# Patient Record
Sex: Male | Born: 1945
Health system: Southern US, Community
[De-identification: ages and names within clinical notes are randomized; demographics above are authoritative.]

## PROBLEM LIST (undated history)

## (undated) DIAGNOSIS — K5790 Diverticulosis of intestine, part unspecified, without perforation or abscess without bleeding: Secondary | ICD-10-CM

## (undated) DIAGNOSIS — J189 Pneumonia, unspecified organism: Secondary | ICD-10-CM

## (undated) DIAGNOSIS — I712 Thoracic aortic aneurysm, without rupture, unspecified: Secondary | ICD-10-CM

## (undated) DIAGNOSIS — R0600 Dyspnea, unspecified: Secondary | ICD-10-CM

## (undated) DIAGNOSIS — C61 Malignant neoplasm of prostate: Secondary | ICD-10-CM

## (undated) DIAGNOSIS — M810 Age-related osteoporosis without current pathological fracture: Secondary | ICD-10-CM

## (undated) DIAGNOSIS — K219 Gastro-esophageal reflux disease without esophagitis: Secondary | ICD-10-CM

## (undated) DIAGNOSIS — Z973 Presence of spectacles and contact lenses: Secondary | ICD-10-CM

## (undated) DIAGNOSIS — F419 Anxiety disorder, unspecified: Secondary | ICD-10-CM

## (undated) DIAGNOSIS — F329 Major depressive disorder, single episode, unspecified: Secondary | ICD-10-CM

## (undated) DIAGNOSIS — E785 Hyperlipidemia, unspecified: Secondary | ICD-10-CM

## (undated) DIAGNOSIS — E890 Postprocedural hypothyroidism: Secondary | ICD-10-CM

## (undated) DIAGNOSIS — I10 Essential (primary) hypertension: Secondary | ICD-10-CM

## (undated) DIAGNOSIS — F32A Depression, unspecified: Secondary | ICD-10-CM

## (undated) DIAGNOSIS — N529 Male erectile dysfunction, unspecified: Secondary | ICD-10-CM

## (undated) DIAGNOSIS — G4733 Obstructive sleep apnea (adult) (pediatric): Secondary | ICD-10-CM

## (undated) DIAGNOSIS — M1990 Unspecified osteoarthritis, unspecified site: Secondary | ICD-10-CM

## (undated) DIAGNOSIS — N3941 Urge incontinence: Secondary | ICD-10-CM

## (undated) DIAGNOSIS — R351 Nocturia: Secondary | ICD-10-CM

## (undated) DIAGNOSIS — R112 Nausea with vomiting, unspecified: Secondary | ICD-10-CM

## (undated) DIAGNOSIS — Z9889 Other specified postprocedural states: Secondary | ICD-10-CM

## (undated) HISTORY — DX: Hyperlipidemia, unspecified: E78.5

## (undated) HISTORY — DX: Depression, unspecified: F32.A

## (undated) HISTORY — PX: TONSILLECTOMY: SUR1361

## (undated) HISTORY — DX: Unspecified osteoarthritis, unspecified site: M19.90

## (undated) HISTORY — PX: INSERTION PROSTATE RADIATION SEED: SUR718

## (undated) HISTORY — DX: Malignant neoplasm of prostate: C61

## (undated) HISTORY — DX: Diverticulosis of intestine, part unspecified, without perforation or abscess without bleeding: K57.90

## (undated) HISTORY — PX: INGUINAL HERNIA REPAIR: SUR1180

## (undated) HISTORY — DX: Major depressive disorder, single episode, unspecified: F32.9

## (undated) HISTORY — PX: PROSTATE BIOPSY: SHX241

---

## 1968-08-11 DIAGNOSIS — J189 Pneumonia, unspecified organism: Secondary | ICD-10-CM

## 1968-08-11 HISTORY — DX: Pneumonia, unspecified organism: J18.9

## 1980-08-11 HISTORY — PX: RHINOPLASTY: SUR1284

## 1980-08-11 HISTORY — PX: NASAL SEPTUM SURGERY: SHX37

## 2000-08-11 HISTORY — PX: UVULOPALATOPHARYNGOPLASTY: SHX827

## 2002-08-11 HISTORY — PX: SHOULDER ARTHROSCOPY W/ ROTATOR CUFF REPAIR: SHX2400

## 2005-08-11 HISTORY — PX: HEMORRHOIDECTOMY WITH HEMORRHOID BANDING: SHX5633

## 2007-03-26 ENCOUNTER — Ambulatory Visit: Payer: Self-pay | Admitting: Endocrinology

## 2007-03-26 LAB — CONVERTED CEMR LAB
Hep B S Ab: NEGATIVE
Hepatitis B Surface Ag: NEGATIVE

## 2007-03-31 ENCOUNTER — Ambulatory Visit: Payer: Self-pay | Admitting: Endocrinology

## 2007-10-21 ENCOUNTER — Encounter (INDEPENDENT_AMBULATORY_CARE_PROVIDER_SITE_OTHER): Payer: Self-pay | Admitting: *Deleted

## 2007-10-21 ENCOUNTER — Ambulatory Visit: Payer: Self-pay | Admitting: Endocrinology

## 2007-10-21 DIAGNOSIS — F419 Anxiety disorder, unspecified: Secondary | ICD-10-CM | POA: Insufficient documentation

## 2007-10-21 DIAGNOSIS — E018 Other iodine-deficiency related thyroid disorders and allied conditions: Secondary | ICD-10-CM | POA: Insufficient documentation

## 2007-10-21 DIAGNOSIS — M810 Age-related osteoporosis without current pathological fracture: Secondary | ICD-10-CM | POA: Insufficient documentation

## 2007-10-21 DIAGNOSIS — K625 Hemorrhage of anus and rectum: Secondary | ICD-10-CM | POA: Insufficient documentation

## 2007-10-21 DIAGNOSIS — K219 Gastro-esophageal reflux disease without esophagitis: Secondary | ICD-10-CM | POA: Insufficient documentation

## 2007-10-21 DIAGNOSIS — G4700 Insomnia, unspecified: Secondary | ICD-10-CM | POA: Insufficient documentation

## 2007-10-21 DIAGNOSIS — F329 Major depressive disorder, single episode, unspecified: Secondary | ICD-10-CM

## 2007-10-21 DIAGNOSIS — E785 Hyperlipidemia, unspecified: Secondary | ICD-10-CM | POA: Insufficient documentation

## 2007-10-25 LAB — CONVERTED CEMR LAB
ALT: 28 units/L (ref 0–53)
Albumin: 4.1 g/dL (ref 3.5–5.2)
Alkaline Phosphatase: 44 units/L (ref 39–117)
Calcium, Total (PTH): 9.5 mg/dL (ref 8.4–10.5)
LDL Cholesterol: 67 mg/dL (ref 0–99)
PTH: 30.1 pg/mL (ref 14.0–72.0)
VLDL: 15 mg/dL (ref 0–40)

## 2007-11-22 ENCOUNTER — Ambulatory Visit: Payer: Self-pay | Admitting: Gastroenterology

## 2007-11-22 ENCOUNTER — Ambulatory Visit: Payer: Self-pay | Admitting: Endocrinology

## 2007-11-24 ENCOUNTER — Encounter: Payer: Self-pay | Admitting: Gastroenterology

## 2007-11-24 ENCOUNTER — Ambulatory Visit: Payer: Self-pay | Admitting: Gastroenterology

## 2007-11-24 LAB — HM COLONOSCOPY

## 2007-12-06 ENCOUNTER — Encounter: Payer: Self-pay | Admitting: Gastroenterology

## 2007-12-24 ENCOUNTER — Encounter: Payer: Self-pay | Admitting: Endocrinology

## 2007-12-28 ENCOUNTER — Encounter: Payer: Self-pay | Admitting: Endocrinology

## 2008-02-25 ENCOUNTER — Encounter: Payer: Self-pay | Admitting: Endocrinology

## 2008-03-30 ENCOUNTER — Telehealth: Payer: Self-pay | Admitting: Endocrinology

## 2008-04-10 ENCOUNTER — Telehealth: Payer: Self-pay | Admitting: Endocrinology

## 2008-04-11 LAB — CONVERTED CEMR LAB: PSA: NORMAL ng/mL

## 2008-05-09 ENCOUNTER — Ambulatory Visit: Payer: Self-pay | Admitting: Endocrinology

## 2008-05-09 DIAGNOSIS — F419 Anxiety disorder, unspecified: Secondary | ICD-10-CM | POA: Insufficient documentation

## 2008-05-11 ENCOUNTER — Telehealth (INDEPENDENT_AMBULATORY_CARE_PROVIDER_SITE_OTHER): Payer: Self-pay | Admitting: *Deleted

## 2008-05-25 ENCOUNTER — Ambulatory Visit: Payer: Self-pay | Admitting: Psychiatry

## 2008-09-07 ENCOUNTER — Telehealth: Payer: Self-pay | Admitting: Endocrinology

## 2008-10-31 ENCOUNTER — Ambulatory Visit: Payer: Self-pay | Admitting: Endocrinology

## 2008-10-31 ENCOUNTER — Telehealth: Payer: Self-pay | Admitting: Endocrinology

## 2008-10-31 DIAGNOSIS — K648 Other hemorrhoids: Secondary | ICD-10-CM | POA: Insufficient documentation

## 2008-10-31 DIAGNOSIS — L738 Other specified follicular disorders: Secondary | ICD-10-CM | POA: Insufficient documentation

## 2008-10-31 LAB — CONVERTED CEMR LAB
ALT: 27 units/L (ref 0–53)
BUN: 11 mg/dL (ref 6–23)
Basophils Relative: 0.9 % (ref 0.0–3.0)
Eosinophils Relative: 2.7 % (ref 0.0–5.0)
GFR calc non Af Amer: 79.93 mL/min (ref >60)
Glucose, Bld: 118 mg/dL — ABNORMAL HIGH (ref 70–99)
HCT: 44.7 % (ref 39.0–52.0)
Hemoglobin: 15.3 g/dL (ref 13.0–17.0)
LDL Cholesterol: 59 mg/dL (ref 0–99)
MCV: 91.6 fL (ref 78.0–100.0)
Monocytes Absolute: 0.6 10*3/uL (ref 0.1–1.0)
Neutro Abs: 3.2 10*3/uL (ref 1.4–7.7)
Neutrophils Relative %: 55.9 % (ref 43.0–77.0)
Potassium: 4.5 meq/L (ref 3.5–5.1)
RBC: 4.88 M/uL (ref 4.22–5.81)
TSH: 0.07 microintl units/mL — ABNORMAL LOW (ref 0.35–5.50)
Total Bilirubin: 1.8 mg/dL — ABNORMAL HIGH (ref 0.3–1.2)
VLDL: 15.6 mg/dL (ref 0.0–40.0)
WBC: 5.5 10*3/uL (ref 4.5–10.5)

## 2008-11-07 ENCOUNTER — Telehealth: Payer: Self-pay | Admitting: Endocrinology

## 2009-02-06 ENCOUNTER — Telehealth (INDEPENDENT_AMBULATORY_CARE_PROVIDER_SITE_OTHER): Payer: Self-pay | Admitting: *Deleted

## 2009-02-22 ENCOUNTER — Ambulatory Visit: Payer: Self-pay | Admitting: Endocrinology

## 2009-02-22 DIAGNOSIS — R209 Unspecified disturbances of skin sensation: Secondary | ICD-10-CM | POA: Insufficient documentation

## 2009-02-22 DIAGNOSIS — N529 Male erectile dysfunction, unspecified: Secondary | ICD-10-CM | POA: Insufficient documentation

## 2009-02-23 LAB — CONVERTED CEMR LAB
TSH: 0.55 microintl units/mL (ref 0.35–5.50)
Vitamin B-12: 605 pg/mL (ref 211–911)

## 2009-02-26 ENCOUNTER — Telehealth (INDEPENDENT_AMBULATORY_CARE_PROVIDER_SITE_OTHER): Payer: Self-pay | Admitting: *Deleted

## 2009-03-14 ENCOUNTER — Encounter: Payer: Self-pay | Admitting: Endocrinology

## 2009-05-28 ENCOUNTER — Telehealth: Payer: Self-pay | Admitting: Endocrinology

## 2009-05-31 ENCOUNTER — Telehealth (INDEPENDENT_AMBULATORY_CARE_PROVIDER_SITE_OTHER): Payer: Self-pay | Admitting: *Deleted

## 2009-05-31 ENCOUNTER — Ambulatory Visit: Payer: Self-pay | Admitting: Endocrinology

## 2009-06-03 LAB — CONVERTED CEMR LAB
ALT: 29 units/L (ref 0–53)
Basophils Relative: 0.6 % (ref 0.0–3.0)
Bilirubin Urine: NEGATIVE
Bilirubin, Direct: 0.2 mg/dL (ref 0.0–0.3)
Chloride: 103 meq/L (ref 96–112)
Cholesterol: 136 mg/dL (ref 0–200)
Eosinophils Relative: 1.3 % (ref 0.0–5.0)
HCT: 42.6 % (ref 39.0–52.0)
Hemoglobin: 14.6 g/dL (ref 13.0–17.0)
Ketones, ur: NEGATIVE mg/dL
LDL Cholesterol: 62 mg/dL (ref 0–99)
Leukocytes, UA: NEGATIVE
Lymphs Abs: 2.8 10*3/uL (ref 0.7–4.0)
MCV: 94.5 fL (ref 78.0–100.0)
Monocytes Absolute: 1 10*3/uL (ref 0.1–1.0)
PSA: 2.08 ng/mL (ref 0.10–4.00)
Potassium: 4.3 meq/L (ref 3.5–5.1)
RBC: 4.51 M/uL (ref 4.22–5.81)
TSH: 0.97 microintl units/mL (ref 0.35–5.50)
Total CHOL/HDL Ratio: 3
Total Protein: 7.2 g/dL (ref 6.0–8.3)
Urine Glucose: NEGATIVE mg/dL
WBC: 8.9 10*3/uL (ref 4.5–10.5)
pH: 5.5 (ref 5.0–8.0)

## 2009-06-05 ENCOUNTER — Ambulatory Visit: Payer: Self-pay | Admitting: Endocrinology

## 2009-09-06 ENCOUNTER — Telehealth (INDEPENDENT_AMBULATORY_CARE_PROVIDER_SITE_OTHER): Payer: Self-pay | Admitting: *Deleted

## 2009-09-14 ENCOUNTER — Telehealth: Payer: Self-pay | Admitting: Endocrinology

## 2009-09-17 ENCOUNTER — Telehealth: Payer: Self-pay | Admitting: Endocrinology

## 2009-09-18 ENCOUNTER — Ambulatory Visit: Payer: Self-pay | Admitting: Endocrinology

## 2010-03-05 ENCOUNTER — Ambulatory Visit: Payer: Self-pay | Admitting: Internal Medicine

## 2010-03-05 DIAGNOSIS — G4733 Obstructive sleep apnea (adult) (pediatric): Secondary | ICD-10-CM | POA: Insufficient documentation

## 2010-03-05 LAB — CONVERTED CEMR LAB
ALT: 22 units/L (ref 0–53)
Albumin: 4.2 g/dL (ref 3.5–5.2)
Alkaline Phosphatase: 55 units/L (ref 39–117)
Basophils Absolute: 0 10*3/uL (ref 0.0–0.1)
Basophils Relative: 0.4 % (ref 0.0–3.0)
CO2: 29 meq/L (ref 19–32)
Calcium: 9.1 mg/dL (ref 8.4–10.5)
Chloride: 104 meq/L (ref 96–112)
Eosinophils Absolute: 0.1 10*3/uL (ref 0.0–0.7)
Glucose, Bld: 100 mg/dL — ABNORMAL HIGH (ref 70–99)
HCT: 41.8 % (ref 39.0–52.0)
HDL: 48.2 mg/dL (ref >39.00)
Hemoglobin, Urine: NEGATIVE
Hemoglobin: 14.2 g/dL (ref 13.0–17.0)
Ketones, ur: NEGATIVE mg/dL
Lymphs Abs: 2.2 10*3/uL (ref 0.7–4.0)
MCHC: 34 g/dL (ref 30.0–36.0)
MCV: 91.5 fL (ref 78.0–100.0)
Monocytes Absolute: 0.7 10*3/uL (ref 0.1–1.0)
Neutro Abs: 4.7 10*3/uL (ref 1.4–7.7)
Potassium: 4.1 meq/L (ref 3.5–5.1)
RBC: 4.57 M/uL (ref 4.22–5.81)
RDW: 14 % (ref 11.5–14.6)
Sodium: 138 meq/L (ref 135–145)
Total Protein, Urine: NEGATIVE mg/dL
Total Protein: 7 g/dL (ref 6.0–8.3)
Urine Glucose: NEGATIVE mg/dL
Urobilinogen, UA: 0.2 (ref 0.0–1.0)

## 2010-04-28 ENCOUNTER — Encounter: Payer: Self-pay | Admitting: Endocrinology

## 2010-08-11 HISTORY — PX: KNEE ARTHROSCOPY: SUR90

## 2010-09-03 ENCOUNTER — Encounter: Payer: Self-pay | Admitting: Endocrinology

## 2010-09-03 ENCOUNTER — Ambulatory Visit: Admit: 2010-09-03 | Payer: Self-pay | Admitting: Endocrinology

## 2010-09-11 NOTE — Assessment & Plan Note (Signed)
Summary: PER KELLY SCHED--STC   Vital Signs:  Patient profile:   65 year old male Height:      69.5 inches (176.53 cm) Weight:      202.50 pounds (92.05 kg) BMI:     29.58 O2 Sat:      97 % on Room air Temp:     97.0 degrees F (36.11 degrees C) oral Pulse rate:   93 / minute BP sitting:   120 / 88  (left arm) Cuff size:   large  Vitals Entered By: Brenton Grills (September 18, 2009 1:25 PM)  O2 Flow:  Room air CC: pt here for hemorrhoids/referral/wants to know psa level from last lab/aj   CC:  pt here for hemorrhoids/referral/wants to know psa level from last lab/aj.  History of Present Illness: pt states he has had 20 years of hemorrhoids.  he now has few mos of slight bleeding from anal area, and asociated pain and itching.  he denies constipation. pt states effexor 150 mg once daily made him drowsy, and 75 does not work well enough.  Current Medications (verified): 1)  Clonazepam 1 Mg  Tabs (Clonazepam) .Marland Kitchen.. 1 By Mouth Three Times A Day Prn 2)  Lipitor 40 Mg  Tabs (Atorvastatin Calcium) .... Take 1 By Mouth Qd 3)  Lunesta 3 Mg  Tabs (Eszopiclone) .... Take 1 By Mouth Qhs 4)  Zetia 10 Mg  Tabs (Ezetimibe) .... Take 1 By Mouth Qd 5)  Omeprazole 40 Mg Cpdr (Omeprazole) .... Qd 6)  Doxycycline Hyclate 100 Mg Caps (Doxycycline Hyclate) .... Once Daily--Dr Elliot Gurney 7)  Levothyroxine Sodium 112 Mcg Tabs (Levothyroxine Sodium) .... Qd 8)  Venlafaxine Hcl 75 Mg Xr24h-Tab (Venlafaxine Hcl) .Marland Kitchen.. 1 Qd 9)  Levitra 20 Mg Tabs (Vardenafil Hcl) .Marland Kitchen.. 1 Every Other Day As Needed  Allergies (verified): 1)  ! Codeine  Past History:  Past Medical History: Last updated: 10/21/2007 Depression GERD Hyperlipidemia Hypothyroidism Osteoporosis  Past Surgical History: rubber band ligature of hemorrhoids ([place]) 2007  Review of Systems       denies bowel incontinence  Physical Exam  General:  normal appearance.   Rectal:  externally, i see no blood, hemorrhoids, fissure, or  drainage.   Impression & Recommendations:  Problem # 1:  INTERNAL HEMORRHOIDS (ICD-455.0) recurrent  Problem # 2:  DEPRESSION (ICD-311) needs increased rx  Medications Added to Medication List This Visit: 1)  Anusol-hc 2.5 % Crea (Hydrocortisone) .... Three times a day as needed hemorrhoids 2)  Venlafaxine Hcl 37.5 Mg Tabs (Venlafaxine hcl) .... 3 qd  Other Orders: Est. Patient Level III ([address])   Patient Instructions: 1)  metamucil 3 pills, two times a day.   2)  stop the zetia, as the metamucil will replace this. 3)  anusol-hc cream three times a day as needed itching or irritation. 4)  warm baths. 5)  increase venlafaxine to 3x37.5 mg once daily Prescriptions: VENLAFAXINE HCL 37.5 MG TABS (VENLAFAXINE HCL) 3 qd  #270 x 3   Entered and Authorized by:   Minus Breeding MD   Signed by:   Minus Breeding MD on 09/18/2009   Method used:   [facility]  [place] [address] [facility]       [address]       [place], [place]  [address]       Ph: [phone] or [phone]       Fax: [phone]   RxID:  [id number] ANUSOL-HC 2.5 % CREA (HYDROCORTISONE) three times a day as needed hemorrhoids  #1 med tube x 3   Entered and Authorized by:   Minus Breeding MD   Signed by:   Minus Breeding MD on 09/18/2009   Method used:   [facility]  [place] [address] [facility]       [address]       [place], [place]  [address]       Ph: [phone] or [phone]       Fax: [phone]   RxID:   [id number]

## 2010-09-11 NOTE — Progress Notes (Signed)
  Phone Note Refill Request   Refills Requested: Medication #1:  CLONAZEPAM 1 MG  TABS 1 by mouth three times a day prn Please advise refill?  Initial call taken by: Josph Macho CMA,  September 14, 2009 8:37 AM

## 2010-09-11 NOTE — Progress Notes (Signed)
  Phone Note Refill Request Message from:  Fax from Pharmacy on September 17, 2009 8:57 AM  Refills Requested: Medication #1:  LUNESTA 3 MG  TABS take 1 by mouth qhs   Dosage confirmed as above?Dosage Confirmed Please advise refill?  Initial call taken by: Josph Macho CMA,  September 17, 2009 8:58 AM  Follow-up for Phone Call        i printed Follow-up by: Minus Breeding MD,  September 17, 2009 9:12 AM  Additional Follow-up for Phone Call Additional follow up Details #1::        Faxed to pharmacy Additional Follow-up by: Josph Macho CMA,  September 17, 2009 9:26 AM    Prescriptions: LUNESTA 3 MG  TABS (ESZOPICLONE) take 1 by mouth qhs  #90 x 1   Entered and Authorized by:   Minus Breeding MD   Signed by:   Minus Breeding MD on 09/17/2009   Method used:   Print then Give to Patient   RxID:   [id number]

## 2010-09-11 NOTE — Progress Notes (Signed)
Summary: Record request  Request for records received from [employer] Request forwarded to [employer]. Bryan Tyler  September 06, 2009 4:35 PM

## 2010-09-11 NOTE — Progress Notes (Signed)
  Phone Note Refill Request Message from:  Fax from Pharmacy on September 14, 2009 8:34 AM  Refills Requested: Medication #1:  LUNESTA 3 MG  TABS take 1 by mouth qhs   Dosage confirmed as above?Dosage Confirmed Initial call taken by: Josph Macho CMA,  September 14, 2009 8:34 AM    Prescriptions: LIPITOR 40 MG  TABS (ATORVASTATIN CALCIUM) take 1 by mouth qd  #90 x 2   Entered by:   Josph Macho CMA   Authorized by:   Minus Breeding MD   Signed by:   Josph Macho CMA on 09/14/2009   Method used:   [facility]  [place] [address]       [address]       [place], [place]  [address]       Ph: [phone] or [phone]       Fax: [phone]   RxID:   [id number]

## 2010-09-11 NOTE — Miscellaneous (Signed)
Summary[facility]  [facility]   Imported By: Sherian Rein 05/02/2010 10:56:34  _____________________________________________________________________  External Attachment:    Type:   Image     Comment:   External Document

## 2010-09-11 NOTE — Progress Notes (Signed)
Summary: Referral/refill  Phone Note Call from Patient   Summary of Call: Patient left message on triage that he would like to see someone for rectal issue/hemorrhoids. Would you like to see patient or refer to GI? Patient also needs refills of Clonazepam and Lunesta. Please advise. Initial call taken by: Lucious Groves,  September 17, 2009 2:25 PM  Follow-up for Phone Call        this is the type of thing thast is best rechecked here first, so we can see which specialist is best, if you need one. i printed rx for clonazepam.  the lunesta is not due yet. Follow-up by: Minus Breeding MD,  September 17, 2009 3:35 PM  Additional Follow-up for Phone Call Additional follow up Details #1::        left message on machine to call back to office. Additional Follow-up by: Lucious Groves,  September 17, 2009 3:39 PM    Additional Follow-up for Phone Call Additional follow up Details #2::    Patient notified, and prescription faxed. Follow-up by: Lucious Groves,  September 17, 2009 4:37 PM  Prescriptions: CLONAZEPAM 1 MG  TABS (CLONAZEPAM) 1 by mouth three times a day prn  #90 x 2   Entered and Authorized by:   Minus Breeding MD   Signed by:   Minus Breeding MD on 09/17/2009   Method used:   Print then Give to Patient   RxID:   [id number]

## 2010-09-11 NOTE — Assessment & Plan Note (Signed)
Summary: GROIN PAIN/ HAS HAD HERNIA REPAIR SURGERY IN THE PAST/NWS   Vital Signs:  Patient profile:   65 year old male Height:      70 inches Weight:      196 pounds BMI:     28.22 O2 Sat:      97 % on Room air Temp:     98.6 degrees F oral Pulse rate:   80 / minute BP sitting:   122 / 80  (left arm) Cuff size:   regular  Vitals Entered By: Zella Ball Ewing CMA Duncan Dull) (March 05, 2010 2:37 PM)  O2 Flow:  Room air  Preventive Care Screening  Colonoscopy:    Next Due:  11/2012  CC: Groin pain, refills/RE   CC:  Groin pain and refills/RE.  History of Present Illness: here to f/u, dr Everardo All off on vacation;  pt with hx of 5 hernia surguries bilat (3 on the right, 2 on the left);  no sweling, but had aching pain to the right groin after working in the yard - lawnmower work;  no back or leg pain except for ache oin rigtht groin, constant, moderate pain/pressure type;  had some pain med left over from dental work - hydroco 7.5/750 which worked well, then comes bak in a few hrs;  no ambulation problem - in fact feels better, no falls or LE pain, weak/numb or localized sweling. No bowel habit change - taking metamucil and seems to work.  No urinary changes except for caffeine products cuase increased urination, also rare post urinary drip occasinally.  No fever, wt loss, night sweats, loss of appetite or other constitutional symptoms  Does have some anxiety worse with the pain.    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 1 By Mouth Three Times A Day Prn 2)  Lipitor 40 Mg  Tabs (Atorvastatin Calcium) .... Take 1 By Mouth Qd 3)  Lunesta 3 Mg  Tabs (Eszopiclone) .... Take 1 By Mouth Qhs 4)  Omeprazole 40 Mg Cpdr (Omeprazole) .... Qd 5)  Doxycycline Hyclate 100 Mg Caps (Doxycycline Hyclate) .... Once Daily--Dr Elliot Gurney 6)  Levothyroxine Sodium 112 Mcg Tabs (Levothyroxine Sodium) .... Qd 7)  Levitra 20 Mg Tabs (Vardenafil Hcl) .Marland Kitchen.. 1 Every Other Day As Needed 8)  Anusol-Hc 2.5 % Crea (Hydrocortisone) .... Three Times A Day As Needed Hemorrhoids 9)  Venlafaxine Hcl 37.5 Mg Tabs (Venlafaxine Hcl) .... 3 Qd  Current Medications (verified): 1)  Clonazepam 1 Mg  Tabs (Clonazepam) .Marland Kitchen.. 1 By Mouth Three Times A Day As Needed 2)  Lipitor 40 Mg  Tabs (Atorvastatin Calcium) .... Take 1 By Mouth Qd 3)  Lunesta 3 Mg  Tabs (Eszopiclone) .... Take 1 By Mouth Qhs 4)  Omeprazole 40 Mg Cpdr (Omeprazole) .... Qd 5)  Doxycycline Hyclate 100 Mg Caps (Doxycycline Hyclate) .... Once Daily--Dr Elliot Gurney 6)  Levothyroxine Sodium 112 Mcg Tabs (Levothyroxine Sodium) .... Qd 7)  Levitra 20 Mg Tabs (  Vardenafil Hcl) .Marland Kitchen.. 1 Every Other Day As Needed 8)  Anusol-Hc 2.5 % Crea (Hydrocortisone) .... Three Times A Day As Needed Hemorrhoids 9)  Venlafaxine Hcl 37.5 Mg Tabs (Venlafaxine Hcl) .... 3 Qd 10)  Flexeril 5 Mg Tabs (Cyclobenzaprine Hcl) .Marland Kitchen.. 1po Three Times A Day As Needed  Allergies (verified): 1)  ! 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  , not ill apperaing, but marked anxious Head:  normocephalic and atraumatic.   Eyes:  vision grossly intact, pupils equal, and pupils round.   Ears:  R ear normal and L ear normal.   Nose:  no external deformity and no nasal discharge.   Mouth:  no gingival abnormalities and pharynx pink and moist.   Neck:  supple and no masses.   Lungs:  normal respiratory effort and normal breath sounds.   Heart:  normal rate and regular rhythm.   Abdomen:  soft and normal bowel sounds.  , with mild tender RUQ (no guarding or rebound), but mild tender to right lower/groin area with deep palpation only Genitalia:  Testes bilaterally descended without nodularity, tenderness or masses. No scrotal masses or lesions. No penis lesions or urethral discharge., except for right groin linear scar  Msk:  right and left hips with FROM and no pain elicited on ROM testing Extremities:  no edema, no erythema    Impression & Recommendations:  Problem # 1:  GROIN PAIN (ICD-789.09)  suspect prob MSK; has hydrocodone as needed at home,  will add flexeril as needed,  check routine labs given the mild RUQ tender as well, consider CT if worsens  Orders: TLB-Udip w/ Micro (81001-URINE)  His updated medication list for this problem includes:    Flexeril 5 Mg Tabs (Cyclobenzaprine hcl) .Marland Kitchen... 1po three times a day as needed  Problem # 2:  ABDOMINAL TENDERNESS RIGHT UPPER QUADRANT (ICD-789.61)  ? clinical significance, treat as above, f/u any worsening signs or symptoms   Orders: TLB-BMP (Basic Metabolic Panel-BMET) (80048-METABOL) TLB-CBC Platelet - w/Differential (85025-CBCD) TLB-Sedimentation Rate (ESR) (85652-ESR)  Problem # 3:  ANXIETY STATE, UNSPECIFIED (ICD-300.00)  His updated medication list for this problem includes:    Clonazepam 1 Mg Tabs (Clonazepam) .Marland Kitchen... 1 by mouth three times a day as needed    Venlafaxine Hcl 37.5 Mg Tabs (Venlafaxine hcl) .Marland KitchenMarland KitchenMarland Kitchen. 3 qd for med refills  today per pt request  Problem # 4:  HYPERLIPIDEMIA (ICD-272.4)  His updated medication list for this problem includes:    Lipitor 40 Mg Tabs (Atorvastatin calcium) .Marland Kitchen... Take 1 by mouth qd for lipids f/u per pt request  Orders: TLB-Lipid Panel (80061-LIPID)  Complete Medication List: 1)  Clonazepam 1 Mg Tabs (Clonazepam) .Marland Kitchen.. 1 by mouth three times a day as needed 2)  Lipitor 40 Mg Tabs (Atorvastatin calcium) .... Take 1 by mouth qd 3)  Lunesta 3 Mg Tabs (Eszopiclone) .... Take 1 by mouth qhs 4)  Omeprazole 40 Mg Cpdr (Omeprazole) .... Qd 5)  Doxycycline Hyclate 100 Mg Caps (Doxycycline hyclate) .... Once daily--dr woody 6)  Levothyroxine Sodium 112 Mcg Tabs (Levothyroxine sodium) .... Qd 7)  Levitra 20 Mg Tabs (Vardenafil hcl) .Marland KitchenMarland KitchenMarland Kitchen  1 every other day as needed 8)  Anusol-hc 2.5 % Crea (Hydrocortisone) .... Three times a day as needed hemorrhoids 9)  Venlafaxine Hcl 37.5 Mg Tabs (Venlafaxine hcl) .... 3 qd 10)  Flexeril 5 Mg Tabs (Cyclobenzaprine hcl) .Marland Kitchen.. 1po three times a day as needed  Other Orders: TLB-Hepatic/Liver Function Pnl (80076-HEPATIC)  Patient Instructions: 1)  Please go to the Lab in the basement for your blood and/or urine tests today  2)  Continue all previous medications as before this visit  3)  you are given the lunesta and clonazepam refills today 4)  Please schedule a follow-up appointment in 3 months with Dr Everardo All for the "yearly medicare exam" with the PSA and other labs Prescriptions: FLEXERIL 5 MG TABS (CYCLOBENZAPRINE HCL) 1po three times a day as needed  #60 x 1   Entered and Authorized by:   Corwin Levins MD   Signed by:   Corwin Levins MD on 03/05/2010   Method used:   Print then Give to Patient   RxID:   [id number] LUNESTA 3 MG  TABS (ESZOPICLONE) take 1 by mouth qhs  #30 x 2   Entered and Authorized by:   Corwin Levins MD   Signed by:   Corwin Levins MD on 03/05/2010   Method used:   Print then Give to Patient   RxID:    [id number] CLONAZEPAM 1 MG  TABS (CLONAZEPAM) 1 by mouth three times a day as needed  #90 x 2   Entered and Authorized by:   Corwin Levins MD   Signed by:   Corwin Levins MD on 03/05/2010   Method used:   Print then Give to Patient   RxID:   [id number]

## 2010-09-18 NOTE — Consult Note (Signed)
Summary: [facility]  [facility]   Imported By: Sherian Rein 09/12/2010 12:06:50  _____________________________________________________________________  External Attachment:    Type:   Image     Comment:   External Document

## 2010-09-23 ENCOUNTER — Ambulatory Visit (INDEPENDENT_AMBULATORY_CARE_PROVIDER_SITE_OTHER)
Admission: RE | Admit: 2010-09-23 | Discharge: 2010-09-23 | Disposition: A | Discharge status: Home / Self Care | Payer: Medicare Other | Source: Ambulatory Visit | Attending: Endocrinology | Admitting: Endocrinology

## 2010-09-23 ENCOUNTER — Other Ambulatory Visit: Payer: Self-pay | Admitting: Endocrinology

## 2010-09-23 ENCOUNTER — Encounter: Payer: Self-pay | Admitting: Endocrinology

## 2010-09-23 ENCOUNTER — Encounter (INDEPENDENT_AMBULATORY_CARE_PROVIDER_SITE_OTHER): Payer: Medicare Other | Admitting: Endocrinology

## 2010-09-23 ENCOUNTER — Encounter (INDEPENDENT_AMBULATORY_CARE_PROVIDER_SITE_OTHER): Payer: Self-pay | Admitting: *Deleted

## 2010-09-23 ENCOUNTER — Other Ambulatory Visit: Payer: Medicare Other

## 2010-09-23 DIAGNOSIS — R05 Cough: Secondary | ICD-10-CM

## 2010-09-23 DIAGNOSIS — H9190 Unspecified hearing loss, unspecified ear: Secondary | ICD-10-CM | POA: Insufficient documentation

## 2010-09-23 DIAGNOSIS — R059 Cough, unspecified: Secondary | ICD-10-CM

## 2010-09-23 DIAGNOSIS — Z0000 Encounter for general adult medical examination without abnormal findings: Secondary | ICD-10-CM

## 2010-09-23 DIAGNOSIS — Z129 Encounter for screening for malignant neoplasm, site unspecified: Secondary | ICD-10-CM

## 2010-09-23 DIAGNOSIS — E785 Hyperlipidemia, unspecified: Secondary | ICD-10-CM

## 2010-09-23 LAB — PSA: PSA: 2.34 ng/mL (ref 0.10–4.00)

## 2010-10-02 NOTE — Assessment & Plan Note (Signed)
Summary: yearly medicare exam,#,cd   Vital Signs:  Patient profile:   65 year old male Height:      70 inches (177.80 cm) Weight:      195.25 pounds (88.75 kg) BMI:     28.12 O2 Sat:      94 % on Room air Temp:     97.9 degrees F (36.61 degrees C) oral Pulse rate:   70 / minute Pulse rhythm:   regular BP sitting:   118 / 78  (left arm) Cuff size:   regular  Vitals Entered By: Brenton Grills CMA Duncan Dull) (September 23, 2010 8:53 AM)  O2 Flow:  Room air CC: Yearly Medicare Wellness/pt is not currently taking Doxyclycline or Levitra/aj Is Patient Diabetic? No   CC:  Yearly Medicare Wellness/pt is not currently taking Doxyclycline or Levitra/aj.  History of Present Illness: here for regular wellness examination.  He's feeling pretty well in general, and does not drink or smoke.   Current Medications (verified): 1)  Clonazepam 1 Mg  Tabs (Clonazepam) .Marland Kitchen.. 1 By Mouth Three Times A Day As Needed 2)  Lipitor 40 Mg  Tabs (Atorvastatin Calcium) .... Take 1 By Mouth Qd 3)  Lunesta 3 Mg  Tabs (Eszopiclone) .... Take 1 By Mouth Qhs 4)  Omeprazole 40 Mg Cpdr (Omeprazole) .... As Needed 5)  Doxycycline Hyclate 100 Mg Caps (Doxycycline Hyclate) .... Once Daily--Dr Elliot Gurney 6)  Levothyroxine Sodium 112 Mcg Tabs (Levothyroxine Sodium) .... Qd 7)  Levitra 20 Mg Tabs (Vardenafil Hcl) .Marland Kitchen.. 1 Every Other Day As Needed 8)  Anusol-Hc 2.5 % Crea (Hydrocortisone) .... Three Times A Day As Needed Hemorrhoids 9)  Venlafaxine Hcl 37.5 Mg Tabs (Venlafaxine Hcl) .... 3 Qd 10)  Flexeril 5 Mg Tabs (Cyclobenzaprine Hcl) .Marland Kitchen.. 1po Three Times A Day As Needed  Allergies (verified): 1)  ! Codeine  Family History: Reviewed history from 10/21/2007 and no changes required. mother had pancreatic cancer.   father had cabg at age 86  Social History: Reviewed history from 10/21/2007 and no changes required. married middle school teacher  Review of Systems  The patient denies fever, weight loss, weight gain,  vision loss, chest pain, syncope, headaches, abdominal pain, melena, hematochezia, severe indigestion/heartburn, hematuria, suspicious skin lesions, and depression.         anxiety is controlled "pretty well."  Physical Exam  General:  normal appearance.   Head:  head: no deformity eyes: no periorbital swelling, no proptosis external nose and ears are normal mouth: no lesion seen Neck:  Supple without thyroid enlargement or tenderness.  Heart:  Regular rate and rhythm without murmurs or gallops noted. Normal S1,S2.   Abdomen:  abdomen is soft, nontender.  no hepatosplenomegaly.   not distended.  no hernia  Rectal:  normal external and internal exam.  heme neg  Prostate:  Normal size prostate without masses or tenderness.  Msk:  muscle bulk and strength are grossly normal.  no obvious joint swelling.  gait is normal and steady  Pulses:  dorsalis pedis intact bilat.  no carotid bruit  Extremities:  no deformity.  no ulcer on the feet.  feet are of normal color and temp.  no edema  Neurologic:  cn 2-12 grossly intact.   readily moves all 4's.   sensation is intact to touch on the feet  Skin:  normal texture and temp.  no rash.  not diaphoretic  Cervical Nodes:  No significant adenopathy.  Psych:  Alert and cooperative; normal mood and affect; normal attention  span and concentration.   Additional Exam:  SEPARATE EVALUATION FOLLOWS--EACH PROBLEM HERE IS NEW, NOT RESPONDING TO TREATMENT, OR POSES SIGNIFICANT RISK TO THE PATIENT'S HEALTH: HISTORY OF THE PRESENT ILLNESS: pt had uri 1-2 mos ago.  he is bettter, but still has prod-quality cough PAST MEDICAL HISTORY reviewed and up to date today REVIEW OF SYSTEMS: denies sob PHYSICAL EXAMINATION: clear to auscultation.  no respiratory distress head: no deformity eyes: no periorbital swelling, no proptosis external nose and ears are normal mouth: no lesion seen LAB/XRAY RESULTS: cxr: nad IMPRESSION: persistent cough after  uri PLAN: pt advised this will continue to improve with time    Impression & Recommendations:  Problem # 1:  ROUTINE GENERAL MEDICAL EXAM@HEALTH  CARE FACL (ICD-V70.0)  Medications Added to Medication List This Visit: 1)  Omeprazole 40 Mg Cpdr (Omeprazole) .... As needed  Other Orders: EKG w/ Interpretation (93000) TLB-PSA (Prostate Specific Antigen) (84153-PSA) T-2 View CXR (71020TC) Est. Patient Level III (60454) Est. Patient 65& > (09811)   Patient Instructions: 1)  please consider these measures for your health:  minimize alcohol.  do not use tobacco products.  have a colonoscopy at least every 10 years from age 26.  keep firearms safely stored.  always use seat belts.  have working smoke alarms in your home.  see an eye doctor and dentist regularly.  never drive under the influence of alcohol or drugs (including prescription drugs).  those with fair skin should take precautions against the sun. 2)  please let me know what your wishes would be, if artificial life support measures should become necessary.  it is critically important to prevent falling down (keep floor areas well-lit, dry, and free of loose objects) 3)  a prostate blood test is being ordered for you today.  please call [phone] to hear your test results. Prescriptions: ANUSOL-HC 2.5 % CREA (HYDROCORTISONE) three times a day as needed hemorrhoids  #1 med tube x 3   Entered and Authorized by:   Minus Breeding MD   Signed by:   Minus Breeding MD on 09/23/2010   Method used:   [facility]  [place] [address]       [address]       [place], [place]  [address]       Ph: [phone] or [phone]       Fax: [phone]   RxID:   [id number] VENLAFAXINE HCL 37.5 MG TABS (VENLAFAXINE HCL) 3 qd  #270 x 3   Entered and Authorized by:   Minus Breeding MD   Signed by:   Minus Breeding MD on 09/23/2010   Method used:   [facility]        [facility]  [place] [address] [facility]       [address]       [place], [place]  [address]       Ph: [phone] or [phone]       Fax: [phone]   RxID:   [id number] LEVOTHYROXINE SODIUM 112 MCG TABS (LEVOTHYROXINE SODIUM) qd  #90 Tablet x 3   Entered and Authorized by:   Minus Breeding MD   Signed by:   Minus Breeding MD on 09/23/2010   Method used:   [facility]        [facility]  [place] [phone][facility] ([facility]ail)       [address]  [place], [place]  [address]       Ph: [phone] or [phone]       Fax: [phone]   RxID:   [id number] LIPITOR 40 MG  TABS (ATORVASTATIN CALCIUM) take 1 by mouth qd  #90 Tablet x 3   Entered and Authorized by:   Minus Breeding MD   Signed by:   Minus Breeding MD on 09/23/2010   Method used:   [facility]        [facility]  [place] [address] [facility]       [address]       [place], [place]  [address]       Ph: [phone] or [phone]       Fax: [phone]   RxID:   [id number] LUNESTA 3 MG  TABS (ESZOPICLONE) take 1 by mouth qhs  #30 x 2   Entered and Authorized by:   Minus Breeding MD   Signed by:   Minus Breeding MD on 09/23/2010   Method used:   Print then Give to Patient   RxID:   [id number] CLONAZEPAM 1 MG  TABS (CLONAZEPAM) 1 by mouth three times a day as needed  #90 x 2   Entered and Authorized by:   Minus Breeding MD   Signed by:   Minus Breeding MD on 09/23/2010   Method used:   Print then Give to Patient   RxID:   [id number]    Orders Added: 1)  EKG w/ Interpretation [93000] 2)  TLB-PSA (Prostate Specific Antigen) [40086-PYP] 3)  T-2 View CXR [71020TC] 4)  Est. Patient Level III [95093] 5)  Est. Patient 65& > [26712]

## 2010-11-12 ENCOUNTER — Telehealth: Payer: Self-pay

## 2010-11-12 NOTE — Telephone Encounter (Signed)
Options: i am happy to refer you to a specialist Oncologist).   i would be happy to see you here in the office today, and see what i can do in the meantime.

## 2010-11-12 NOTE — Telephone Encounter (Signed)
Pt called requesting referral to specialist about hemorrhoids and possibly an anal fissure.

## 2010-11-13 NOTE — Telephone Encounter (Signed)
Referral sent 

## 2010-11-13 NOTE — Telephone Encounter (Signed)
Pt aware and will expect a call from [facility] with appt info

## 2010-11-13 NOTE — Telephone Encounter (Signed)
Left message on machine for pt to return my call  

## 2010-11-13 NOTE — Telephone Encounter (Signed)
Pt called back stating he wants referral to Surgeon and appt with SAE as well. Pt will call back to schedule appt.

## 2010-12-10 ENCOUNTER — Other Ambulatory Visit: Payer: Self-pay | Admitting: Endocrinology

## 2010-12-23 ENCOUNTER — Other Ambulatory Visit: Payer: Self-pay | Admitting: *Deleted

## 2010-12-23 MED ORDER — CLONAZEPAM 1 MG PO TABS: 1.0000 mg | ORAL_TABLET | Freq: Three times a day (TID) | ORAL | Status: DC | PRN

## 2010-12-23 NOTE — Telephone Encounter (Signed)
Rx refill request for Clonazepam 1mg -please advise  Last filled-09/23/2010  Last OV-09/23/2010

## 2010-12-23 NOTE — Telephone Encounter (Signed)
Rx faxed to Pharmacy.

## 2010-12-24 NOTE — Assessment & Plan Note (Signed)
[facility]                         GASTROENTEROLOGY OFFICE NOTE   COBEN, GODSHALL                      MRN:          [id number]  DATE:11/22/2007                            DOB:          [birth year]    REFERRING PHYSICIAN:  [name] A. Everardo All, M.D.   REASON FOR CONSULTATION:  Rectal bleeding and rectal itching.   HISTORY OF PRESENT ILLNESS:  This is a 65 year old white male who  relates a history of hemorrhoidal problems for about 20 years.  He  relates intermittent swelling, bleeding, and itching.  He states that he  underwent band ligation of hemorrhoids approximately four years ago in  [place].  Over the past several months, he has had small volume  hematochezia, mainly noted on the tissue paper after wiping, and  frequent rectal itching after bowel movements.  He also notes with the  onset of his itching that there is often some new residual stool or  mucous in his perianal area.  He has had no change in bowel habits and  notes no diarrhea, constipation, change in stool caliber, abdominal  pain, or weight loss.  He has had rare episodes of heartburn occurring  approximately once a month.  His symptoms are transient.  He has used  Analpram cream on a p.r.n. basis which has promptly relieved the rectal  itching.   PAST MEDICAL HISTORY:  hyperlipidemia  history of Graves disease  anxiety  depression  allergic rhinitis  sleep apnea  hemorrhoids  hypothyroidism  osteoporosis   PAST SURGICAL HISTORY:  Status post bilateral inguinal hernia repairs  with mesh bilaterally  status post band ligation of hemorrhoids, approximately 2005.   CURRENT MEDICATIONS:  Listed on the chart, updated, and reviewed.   ALLERGIES:  CODEINE intolerance leading to nausea.   SOCIAL HISTORY:   REVIEW OF SYSTEMS:  Per the handwritten form.   PHYSICAL EXAMINATION:  GENERAL:  Well-developed, well-nourished, white  male in no acute distress.  VITAL SIGNS:   Height 5 feet 9-1/2 inches, weight 197.8 pounds, blood  pressure 122/80, pulse 80 and regular.  HEENT:  Anicteric sclerae.  Oropharynx clear.  CHEST:  Clear to auscultation bilaterally.  HEART:  Regular rate and rhythm without murmurs appreciated.  ABDOMEN:  Soft, nontender, and nondistended.  Normal active bowel  sounds.  No palpable organomegaly, masses, or hernias.  RECTAL:  Deferred to colonoscopy on Wednesday.  EXTREMITIES:  Without cyanosis, clubbing, or edema.  NEUROLOGY:  Alert and oriented x3.  Grossly nonfocal.   ASSESSMENT:  Small volume hematochezia and rectal itching.  I suspect  his symptoms are related to recurrent hemorrhoids.  Begin standard  rectal care instructions and Canasta 1000 mg suppositories q.h.s.  May  use Analpram cream t.i.d. p.r.n.  The risks, benefits, and alternatives  to colonoscopy with possible biopsy, possible polypectomy, and possible  destruction of internal hemorrhoids was discussed with the patient and  he consents to proceed and it will be scheduled electively on November 24, 2007.     Venita Lick. Russella Dar, MD, [facility]  Electronically Signed    MTS/MedQ  DD: 11/22/2007  DT: 11/22/2007  Job #: [id number]   cc:   Cleophas Dunker. Everardo All, MD

## 2011-01-24 ENCOUNTER — Telehealth: Payer: Self-pay | Admitting: *Deleted

## 2011-01-24 DIAGNOSIS — R310 Gross hematuria: Secondary | ICD-10-CM

## 2011-01-24 NOTE — Telephone Encounter (Signed)
Pt informed

## 2011-01-24 NOTE — Telephone Encounter (Signed)
Referral done per emr 

## 2011-01-24 NOTE — Telephone Encounter (Signed)
Pt is requesting referral to urologist-pt had hemorrhoid surgery on Monday and has noticed blood in his urine. Per pt, surgeon's office suggested that pt see a urologist-SAE pt-please advise

## 2011-01-28 ENCOUNTER — Encounter (INDEPENDENT_AMBULATORY_CARE_PROVIDER_SITE_OTHER): Payer: Self-pay | Admitting: General Surgery

## 2011-02-27 ENCOUNTER — Other Ambulatory Visit (HOSPITAL_COMMUNITY): Payer: Self-pay | Admitting: Urology

## 2011-02-27 DIAGNOSIS — N281 Cyst of kidney, acquired: Secondary | ICD-10-CM

## 2011-03-02 ENCOUNTER — Ambulatory Visit (HOSPITAL_COMMUNITY)
Admission: RE | Admit: 2011-03-02 | Discharge: 2011-03-02 | Disposition: A | Discharge status: Home / Self Care | Payer: Medicare Other | Source: Ambulatory Visit | Attending: Urology | Admitting: Urology

## 2011-03-02 DIAGNOSIS — Q619 Cystic kidney disease, unspecified: Secondary | ICD-10-CM | POA: Insufficient documentation

## 2011-03-02 DIAGNOSIS — N281 Cyst of kidney, acquired: Secondary | ICD-10-CM

## 2011-03-02 MED ORDER — GADOBENATE DIMEGLUMINE 529 MG/ML IV SOLN
18.0000 mL | Freq: Once | INTRAVENOUS | Status: AC | PRN
  Administered 2011-03-02: 18 mL via INTRAVENOUS

## 2011-03-19 ENCOUNTER — Other Ambulatory Visit: Payer: Self-pay | Admitting: *Deleted

## 2011-03-19 NOTE — Telephone Encounter (Signed)
R'cd fax from [facility] for refill of Clonazepam-last filled 12/23/2010  Last OV-09/23/2010

## 2011-03-20 MED ORDER — CLONAZEPAM 1 MG PO TABS: 1.0000 mg | ORAL_TABLET | Freq: Three times a day (TID) | ORAL | Status: DC | PRN

## 2011-03-20 NOTE — Telephone Encounter (Signed)
Rx faxed to [facility].  

## 2011-04-28 ENCOUNTER — Other Ambulatory Visit: Payer: Self-pay | Admitting: Endocrinology

## 2011-05-16 ENCOUNTER — Telehealth: Payer: Self-pay | Admitting: *Deleted

## 2011-05-16 ENCOUNTER — Other Ambulatory Visit: Payer: Self-pay | Admitting: *Deleted

## 2011-05-16 NOTE — Telephone Encounter (Signed)
Immunization report from [facility]  Immunization given-Influenza (Fluzone) Date Administered: 05/10/2011 Lot : [id number] Exp Date: 01/2012 Dose: 0.39ml Route: IM

## 2011-05-16 NOTE — Telephone Encounter (Signed)
R'cd fax from [facility] for Lunesta refill  Last filled-03/14/2011  Last OV-09/2010

## 2011-05-17 MED ORDER — ESZOPICLONE 3 MG PO TABS: 3.0000 mg | ORAL_TABLET | Freq: Every evening | ORAL | Status: DC | PRN

## 2011-05-19 NOTE — Telephone Encounter (Signed)
Rx faxed to [facility].  

## 2011-07-08 ENCOUNTER — Telehealth: Payer: Self-pay | Admitting: *Deleted

## 2011-07-08 DIAGNOSIS — T887XXA Unspecified adverse effect of drug or medicament, initial encounter: Secondary | ICD-10-CM | POA: Insufficient documentation

## 2011-07-08 DIAGNOSIS — E785 Hyperlipidemia, unspecified: Secondary | ICD-10-CM

## 2011-07-08 DIAGNOSIS — E018 Other iodine-deficiency related thyroid disorders and allied conditions: Secondary | ICD-10-CM

## 2011-07-08 DIAGNOSIS — M810 Age-related osteoporosis without current pathological fracture: Secondary | ICD-10-CM

## 2011-07-08 DIAGNOSIS — Z0000 Encounter for general adult medical examination without abnormal findings: Secondary | ICD-10-CM

## 2011-07-08 DIAGNOSIS — N529 Male erectile dysfunction, unspecified: Secondary | ICD-10-CM

## 2011-07-08 DIAGNOSIS — Z79899 Other long term (current) drug therapy: Secondary | ICD-10-CM

## 2011-07-08 DIAGNOSIS — R310 Gross hematuria: Secondary | ICD-10-CM

## 2011-07-08 NOTE — Telephone Encounter (Signed)
Pt informed lab orders placed in system.

## 2011-07-08 NOTE — Telephone Encounter (Signed)
Left message for pt to callback office.  

## 2011-07-08 NOTE — Telephone Encounter (Signed)
Pt has appointment next week for 6 month F/U and wants to know if he needs to come in early to have labs done.

## 2011-07-08 NOTE — Telephone Encounter (Signed)
i ordered

## 2011-07-09 ENCOUNTER — Encounter: Payer: Self-pay | Admitting: *Deleted

## 2011-07-09 ENCOUNTER — Other Ambulatory Visit (INDEPENDENT_AMBULATORY_CARE_PROVIDER_SITE_OTHER): Payer: Medicare Other

## 2011-07-09 DIAGNOSIS — Z0000 Encounter for general adult medical examination without abnormal findings: Secondary | ICD-10-CM

## 2011-07-09 DIAGNOSIS — Z79899 Other long term (current) drug therapy: Secondary | ICD-10-CM

## 2011-07-09 DIAGNOSIS — N529 Male erectile dysfunction, unspecified: Secondary | ICD-10-CM

## 2011-07-09 DIAGNOSIS — M810 Age-related osteoporosis without current pathological fracture: Secondary | ICD-10-CM

## 2011-07-09 DIAGNOSIS — R310 Gross hematuria: Secondary | ICD-10-CM

## 2011-07-09 DIAGNOSIS — E018 Other iodine-deficiency related thyroid disorders and allied conditions: Secondary | ICD-10-CM

## 2011-07-09 DIAGNOSIS — E785 Hyperlipidemia, unspecified: Secondary | ICD-10-CM

## 2011-07-09 LAB — LIPID PANEL
HDL: 43.6 mg/dL (ref >39.00)
LDL Cholesterol: 91 mg/dL (ref 0–99)
Total CHOL/HDL Ratio: 4
Triglycerides: 107 mg/dL (ref 0.0–149.0)

## 2011-07-09 LAB — CBC WITH DIFFERENTIAL/PLATELET
Basophils Relative: 0.5 % (ref 0.0–3.0)
Eosinophils Absolute: 0.3 10*3/uL (ref 0.0–0.7)
Eosinophils Relative: 3.4 % (ref 0.0–5.0)
Hemoglobin: 15.1 g/dL (ref 13.0–17.0)
MCHC: 33.4 g/dL (ref 30.0–36.0)
MCV: 92.6 fl (ref 78.0–100.0)
Monocytes Absolute: 1 10*3/uL (ref 0.1–1.0)
Neutro Abs: 5.6 10*3/uL (ref 1.4–7.7)
Neutrophils Relative %: 62.8 % (ref 43.0–77.0)
RBC: 4.88 Mil/uL (ref 4.22–5.81)
WBC: 8.8 10*3/uL (ref 4.5–10.5)

## 2011-07-09 LAB — BASIC METABOLIC PANEL
BUN: 16 mg/dL (ref 6–23)
CO2: 28 mEq/L (ref 19–32)
Chloride: 103 mEq/L (ref 96–112)
Creatinine, Ser: 1.1 mg/dL (ref 0.4–1.5)
Glucose, Bld: 99 mg/dL (ref 70–99)
Potassium: 4.9 mEq/L (ref 3.5–5.1)

## 2011-07-09 LAB — URINALYSIS, ROUTINE W REFLEX MICROSCOPIC
Bilirubin Urine: NEGATIVE
Hgb urine dipstick: NEGATIVE
Ketones, ur: NEGATIVE
Total Protein, Urine: NEGATIVE
Urine Glucose: NEGATIVE
pH: 7 (ref 5.0–8.0)

## 2011-07-09 LAB — HEPATIC FUNCTION PANEL
ALT: 20 U/L (ref 0–53)
Bilirubin, Direct: 0.2 mg/dL (ref 0.0–0.3)
Total Bilirubin: 1.1 mg/dL (ref 0.3–1.2)
Total Protein: 7 g/dL (ref 6.0–8.3)

## 2011-07-09 LAB — TSH: TSH: 0.75 u[IU]/mL (ref 0.35–5.50)

## 2011-07-09 LAB — TESTOSTERONE: Testosterone: 560.1 ng/dL (ref 350.00–890.00)

## 2011-07-10 LAB — PTH, INTACT AND CALCIUM: PTH: 22 pg/mL (ref 14.0–72.0)

## 2011-07-15 ENCOUNTER — Encounter: Payer: Self-pay | Admitting: Endocrinology

## 2011-07-15 ENCOUNTER — Ambulatory Visit (INDEPENDENT_AMBULATORY_CARE_PROVIDER_SITE_OTHER): Payer: Medicare Other | Admitting: Endocrinology

## 2011-07-15 VITALS — BP 122/72 | HR 101 | Temp 98.2°F | Ht 70.0 in | Wt 195.4 lb

## 2011-07-15 DIAGNOSIS — N281 Cyst of kidney, acquired: Secondary | ICD-10-CM

## 2011-07-15 MED ORDER — CLONAZEPAM 1 MG PO TABS: 1.0000 mg | ORAL_TABLET | Freq: Three times a day (TID) | ORAL | Status: DC | PRN

## 2011-07-15 NOTE — Patient Instructions (Addendum)
Please sign release of information for the ct scan.   Please come back in the spring for a "medicare wellness" appointment. Stop lunesta. Increase clonazepan to 2 at night (for a total of 3/day). (update:  Pt was advised no f/u is needed, as you are a non-smoker)

## 2011-07-15 NOTE — Progress Notes (Signed)
Subjective:    Patient ID: Bryan Tyler, male    DOB: 09-13-1945, 65 y.o.   MRN: [id number]  HPI Pt says he was incidentally noted to have a "cyst on the lung" on ct of the abdomen.  He has little if any cough. Dyslipidemia: denies chest pain. Post-i-131 hypothyroidism: denies weight change. Insomnia: he says lunesta causes bad dreams, and does not work. Past Medical History  Diagnosis Date  . Sleep apnea   . Depression   . Hyperlipidemia   . Anxiety state, unspecified 05/09/2008  . GERD 10/21/2007  . HEARING LOSS 09/23/2010  . INSOMNIA 10/21/2007  . HYPOTHYROIDISM, POST-RADIOACTIVE IODINE 10/21/2007  . ORGANIC IMPOTENCE 02/22/2009  . OSTEOPOROSIS 10/21/2007  . SLEEP APNEA, OBSTRUCTIVE 03/05/2010  . INTERNAL HEMORRHOIDS 10/31/2008    Past Surgical History  Procedure Date  . Throat surgery   . Nasal septum surgery   . Rotator cuff repair   . Hernia repair     Inguinal Herniorrhapy-multiple bilateral  . Rubber band ligature of hemorrhoids ([place]) 2007  . Uvulopalatoplasty     History   Social History  . Marital Status: Married    Spouse Name: N/A    Number of Children: N/A  . Years of Education: N/A   Occupational History  . Middle School Teacher    Social History Main Topics  . Smoking status: Never Smoker   . Smokeless tobacco: Never Used  . Alcohol Use: Yes  . Drug Use: No  . Sexually Active: Not on file   Other Topics Concern  . Not on file   Social History Narrative  . No narrative on file    Current Outpatient Prescriptions on File Prior to Visit  Medication Sig Dispense Refill  . atorvastatin (LIPITOR) 40 MG tablet Take 40 mg by mouth daily.        Marland Kitchen levothyroxine (SYNTHROID, LEVOTHROID) 112 MCG tablet Take 112 mcg by mouth daily.        Marland Kitchen omeprazole (PRILOSEC) 40 MG capsule Take 40 mg by mouth daily.        Marland Kitchen venlafaxine (EFFEXOR-XR) 75 MG 24 hr capsule Take 75 mg by mouth daily.          Allergies  Allergen Reactions  . Codeine     Family  History  Problem Relation Age of Onset  . Cancer Mother     Pancreatic Cancer  . Heart disease Father 34    CABG    BP 122/72  Pulse 101  Temp(Src) 98.2 F (36.8 C) (Oral)  Ht 5\' 10"  (1.778 m)  Wt 195 lb 6.4 oz (88.633 kg)  BMI 28.04 kg/m2  SpO2 95%    Review of Systems Denies sob.  He seldom has heartburn.      Objective:   Physical Exam VITAL SIGNS:  See vs page GENERAL: no distress LUNGS:  Clear to auscultation   (i obtained and reviewed ct report) Lab Results  Component Value Date   WBC 8.8 07/09/2011   HGB 15.1 07/09/2011   HCT 45.2 07/09/2011   PLT 275.0 07/09/2011   GLUCOSE 99 07/09/2011   CHOL 156 07/09/2011   TRIG 107.0 07/09/2011   HDL 43.60 07/09/2011   LDLCALC 91 07/09/2011   ALT 20 07/09/2011   AST 23 07/09/2011   NA 140 07/09/2011   K 4.9 07/09/2011   CL 103 07/09/2011   CREATININE 1.1 07/09/2011   BUN 16 07/09/2011   CO2 28 07/09/2011   TSH 0.75 07/09/2011   PSA 2.34 09/23/2010  Assessment & Plan:  Small lung lesion, unlikely to be sifnificant Post-i-131 hypothyroidism, well-replaced Insomnia, needs increased rx Dyslipidemia, well-controlled

## 2011-07-16 ENCOUNTER — Telehealth: Payer: Self-pay | Admitting: *Deleted

## 2011-07-16 NOTE — Telephone Encounter (Signed)
R'cd copy of CT report from [facility] called pt regarding SAE's advisement, left message for pt to callback office to inform pt.

## 2011-07-17 NOTE — Telephone Encounter (Signed)
Left message for pt to callback office.  

## 2011-07-17 NOTE — Telephone Encounter (Signed)
Pt informed of MD's advisement. 

## 2011-09-29 ENCOUNTER — Other Ambulatory Visit: Payer: Self-pay | Admitting: Endocrinology

## 2011-09-29 DIAGNOSIS — L820 Inflamed seborrheic keratosis: Secondary | ICD-10-CM | POA: Diagnosis not present

## 2011-09-29 DIAGNOSIS — R209 Unspecified disturbances of skin sensation: Secondary | ICD-10-CM | POA: Diagnosis not present

## 2011-09-29 DIAGNOSIS — H903 Sensorineural hearing loss, bilateral: Secondary | ICD-10-CM | POA: Diagnosis not present

## 2011-09-29 DIAGNOSIS — D235 Other benign neoplasm of skin of trunk: Secondary | ICD-10-CM | POA: Diagnosis not present

## 2011-09-29 DIAGNOSIS — H9190 Unspecified hearing loss, unspecified ear: Secondary | ICD-10-CM | POA: Diagnosis not present

## 2011-09-29 DIAGNOSIS — H905 Unspecified sensorineural hearing loss: Secondary | ICD-10-CM | POA: Diagnosis not present

## 2011-09-29 DIAGNOSIS — R059 Cough, unspecified: Secondary | ICD-10-CM | POA: Diagnosis not present

## 2011-09-29 DIAGNOSIS — R05 Cough: Secondary | ICD-10-CM | POA: Diagnosis not present

## 2011-09-29 DIAGNOSIS — J019 Acute sinusitis, unspecified: Secondary | ICD-10-CM | POA: Diagnosis not present

## 2011-10-16 ENCOUNTER — Encounter: Payer: Self-pay | Admitting: Internal Medicine

## 2011-10-16 ENCOUNTER — Ambulatory Visit (INDEPENDENT_AMBULATORY_CARE_PROVIDER_SITE_OTHER)
Admission: RE | Admit: 2011-10-16 | Discharge: 2011-10-16 | Disposition: A | Discharge status: Home / Self Care | Payer: Medicare Other | Source: Ambulatory Visit | Attending: Internal Medicine | Admitting: Internal Medicine

## 2011-10-16 ENCOUNTER — Ambulatory Visit (INDEPENDENT_AMBULATORY_CARE_PROVIDER_SITE_OTHER): Payer: Medicare Other | Admitting: Internal Medicine

## 2011-10-16 VITALS — BP 112/74 | HR 81 | Temp 97.6°F | Wt 197.1 lb

## 2011-10-16 DIAGNOSIS — R059 Cough, unspecified: Secondary | ICD-10-CM

## 2011-10-16 DIAGNOSIS — M25469 Effusion, unspecified knee: Secondary | ICD-10-CM | POA: Diagnosis not present

## 2011-10-16 DIAGNOSIS — M238X9 Other internal derangements of unspecified knee: Secondary | ICD-10-CM | POA: Diagnosis not present

## 2011-10-16 DIAGNOSIS — M25561 Pain in right knee: Secondary | ICD-10-CM

## 2011-10-16 DIAGNOSIS — M25569 Pain in unspecified knee: Secondary | ICD-10-CM

## 2011-10-16 DIAGNOSIS — M25461 Effusion, right knee: Secondary | ICD-10-CM

## 2011-10-16 DIAGNOSIS — R918 Other nonspecific abnormal finding of lung field: Secondary | ICD-10-CM | POA: Diagnosis not present

## 2011-10-16 DIAGNOSIS — R05 Cough: Secondary | ICD-10-CM | POA: Diagnosis not present

## 2011-10-16 DIAGNOSIS — M25869 Other specified joint disorders, unspecified knee: Secondary | ICD-10-CM | POA: Diagnosis not present

## 2011-10-16 MED ORDER — HYDROCODONE-HOMATROPINE 5-1.5 MG/5ML PO SYRP: 5.0000 mL | ORAL_SOLUTION | Freq: Every day | ORAL | Status: DC

## 2011-10-16 MED ORDER — MELOXICAM 15 MG PO TABS: 15.0000 mg | ORAL_TABLET | Freq: Every day | ORAL | Status: DC

## 2011-10-16 MED ORDER — LORATADINE 10 MG PO TABS: 10.0000 mg | ORAL_TABLET | Freq: Every day | ORAL | Status: DC

## 2011-10-16 MED ORDER — BENZONATATE 200 MG PO CAPS: 200.0000 mg | ORAL_CAPSULE | Freq: Two times a day (BID) | ORAL | Status: AC | PRN

## 2011-10-16 NOTE — Progress Notes (Signed)
Subjective:    Patient ID: Bryan Tyler, male    DOB: 09/19/1945, 66 y.o.   MRN: [id number]  Cough This is a chronic problem. The current episode started more than 1 month ago. The problem has been waxing and waning (improved briefly with 2wk avelox 2/13 from ENT). The cough is productive of sputum (yellow). Associated symptoms include heartburn. Pertinent negatives include no chest pain, fever, headaches, hemoptysis, nasal congestion, postnasal drip, sore throat, shortness of breath, weight loss or wheezing. The symptoms are aggravated by lying down. He has tried OTC cough suppressant for the symptoms. The treatment provided mild relief. His past medical history is significant for environmental allergies.  Knee Pain  The incident occurred more than 1 week ago. The incident occurred in the street. The injury mechanism was a fall. The pain is present in the right knee. The quality of the pain is described as aching and stabbing. The pain is at a severity of 4/10. The pain is moderate. The pain has been fluctuating since onset. It is unknown if a foreign body is present. The symptoms are aggravated by movement (twisting). He has tried nothing for the symptoms.    Past Medical History  Diagnosis Date  . Sleep apnea   . Depression   . Hyperlipidemia   . Anxiety state, unspecified   . GERD   . INSOMNIA   . HYPOTHYROIDISM, POST-RADIOACTIVE IODINE   . ORGANIC IMPOTENCE   . OSTEOPOROSIS      Review of Systems  Constitutional: Negative for fever and weight loss.  HENT: Negative for sore throat and postnasal drip.   Respiratory: Positive for cough. Negative for hemoptysis, shortness of breath and wheezing.   Cardiovascular: Negative for chest pain.  Gastrointestinal: Positive for heartburn.  Neurological: Negative for headaches.  Hematological: Positive for environmental allergies.       Objective:   Physical Exam BP 112/74  Pulse 81  Temp(Src) 97.6 F (36.4 C) (Oral)  Wt 197 lb 1.9  oz (89.413 kg)  SpO2 95% Wt Readings from Last 3 Encounters:  10/16/11 197 lb 1.9 oz (89.413 kg)  07/15/11 195 lb 6.4 oz (88.633 kg)  09/23/10 195 lb 4 oz (88.565 kg)   Constitutional:  He appears well-developed and well-nourished. No distress.  HENT: NCAT, sinus NT, TMs clear B, OP s/p uvuloplasty, mild PND and erythema Neck: Normal range of motion. Neck supple. No JVD present. No thyromegaly present.  Cardiovascular: Normal rate, regular rhythm and normal heart sounds.  No murmur heard. no BLE edema Pulmonary/Chest: Effort normal and breath sounds normal. No respiratory distress. no wheezes.  Abdominal: Soft. Bowel sounds are normal. Patient exhibits no distension. There is no tenderness.  Musculoskeletal: R knee - boggy synovitis, mild effusion and slightly warm - tender to palpation over joint line; FROM and ligamentous function intact Skin: Skin is warm and dry.  No erythema or ulceration.  Psychiatric: he has a normal mood and affect. behavior is normal. Judgment and thought content normal.    Procedure Note: knee intra-articular injection the patient elects to proceed after verbal consent is obtained. the patient informed of possible risks and complications prior to procedure. Using sterile technique throughout, patient is injected with 1:3 depomedrol (40mg ):lidocaine inferior medially into knee. the patient tolerated the procedure well. Ice 24-48h, heat thereafter as needed instructions aftercare provided.       Assessment & Plan:   Cough - suspect cyclical post viral infx 07/2011 - no evidence of persisting infection on history or exam -  check x-ray now; treat with scheduled PPI x2 weeks, antihistamine daily x2 weeks and cough suppression Tessalon Perles narcotic each bedtime x2 weeks - hold antibiotics unless abnormal chest x-ray  Right knee pain with mild effusion. Suspect posttraumatic arthritis given fall injury 2 years ago. Ligamentous exam stable - we'll check x-ray.  Steroid injection done in office today see procedure note above

## 2011-10-16 NOTE — Patient Instructions (Signed)
It was good to see you today. xrays ordered today. Your results will be called to you after review (48-72hours after test completion). If any changes need to be made, you will be notified at that time. If you develop worsening symptoms or fever, call and we can reconsider antibiotics, but it does not appear necessary to use antibiotics at this time. We will treat cough has follows: Omeprazole daily x2 weeks even if no reflux, Claritin daily x2 weeks even if no allergy symptoms, Tessalon twice daily during daytime x2 weeks, Hydromet syrup at bedtime x2 weeks - even if cough resolves Your prescription(s) have been submitted to your pharmacy. Please take as directed and contact our office if you believe you are having problem(s) with the medication(s). Use meloxicam daily x1 week for knee pain, then as needed for pain We have injected your right knee with steroids and lidocaine today. Place ice to the injection site 3 times daily for next 72 hours, then as needed. Call if increased swelling, increased pain or other worsening symptoms

## 2011-11-03 ENCOUNTER — Other Ambulatory Visit: Payer: Self-pay | Admitting: Endocrinology

## 2011-11-05 ENCOUNTER — Other Ambulatory Visit: Payer: Self-pay | Admitting: *Deleted

## 2011-11-05 MED ORDER — CLONAZEPAM 1 MG PO TABS: 1.0000 mg | ORAL_TABLET | Freq: Three times a day (TID) | ORAL | Status: DC | PRN

## 2011-11-05 NOTE — Telephone Encounter (Signed)
R'cd fax from [facility] for refill of Clonazepam-last written 07/15/2011 #90 with 5 refills-please advise

## 2011-11-05 NOTE — Telephone Encounter (Signed)
i printed "medicare wellness" visit is due 

## 2011-11-05 NOTE — Telephone Encounter (Signed)
Rx faxed to [facility].  

## 2011-11-10 DIAGNOSIS — M1710 Unilateral primary osteoarthritis, unspecified knee: Secondary | ICD-10-CM | POA: Diagnosis not present

## 2011-11-10 DIAGNOSIS — IMO0002 Reserved for concepts with insufficient information to code with codable children: Secondary | ICD-10-CM | POA: Diagnosis not present

## 2011-11-10 DIAGNOSIS — M25569 Pain in unspecified knee: Secondary | ICD-10-CM | POA: Diagnosis not present

## 2011-11-12 ENCOUNTER — Ambulatory Visit (INDEPENDENT_AMBULATORY_CARE_PROVIDER_SITE_OTHER): Payer: Medicare Other | Admitting: Internal Medicine

## 2011-11-12 ENCOUNTER — Encounter: Payer: Self-pay | Admitting: Internal Medicine

## 2011-11-12 VITALS — BP 146/90 | HR 83 | Temp 97.1°F

## 2011-11-12 DIAGNOSIS — R059 Cough, unspecified: Secondary | ICD-10-CM

## 2011-11-12 DIAGNOSIS — R05 Cough: Secondary | ICD-10-CM

## 2011-11-12 DIAGNOSIS — R058 Other specified cough: Secondary | ICD-10-CM

## 2011-11-12 DIAGNOSIS — M25569 Pain in unspecified knee: Secondary | ICD-10-CM

## 2011-11-12 MED ORDER — MELOXICAM 15 MG PO TABS: 15.0000 mg | ORAL_TABLET | Freq: Every day | ORAL | Status: AC

## 2011-11-12 MED ORDER — HYDROCODONE-HOMATROPINE 5-1.5 MG/5ML PO SYRP: 5.0000 mL | ORAL_SOLUTION | Freq: Every day | ORAL | Status: DC

## 2011-11-12 MED ORDER — AMOXICILLIN-POT CLAVULANATE 875-125 MG PO TABS: 1.0000 | ORAL_TABLET | Freq: Two times a day (BID) | ORAL | Status: AC

## 2011-11-12 MED ORDER — ALBUTEROL SULFATE HFA 108 (90 BASE) MCG/ACT IN AERS: 2.0000 | INHALATION_SPRAY | Freq: Four times a day (QID) | RESPIRATORY_TRACT | Status: DC | PRN

## 2011-11-12 NOTE — Patient Instructions (Signed)
It was good to see you today. Augmentin antibiotics 2x/day x 2 weeks - also use Albuterol inhaler as needed for cough or wheeze and hydrocodone syrup at night - Your prescription(s) have been submitted to your pharmacy. Please take as directed and contact our office if you believe you are having problem(s) with the medication(s). Also continue to treat cough as follows: Omeprazole daily x2 weeks even if no reflux, Claritin daily x2 weeks even if no allergy symptoms- even if cough resolves Refill on meloxicam for knee pain If continued problems or unimproved on this treatment, call for referral to pulmonology specialist as discussed

## 2011-11-12 NOTE — Progress Notes (Signed)
Subjective:    Patient ID: Bryan Tyler, male    DOB: 1945/09/23, 66 y.o.   MRN: [id number]  HPI  complains of continued cough Seen by me 2 weeks ago for same - treated conservatively with hydrocodone syrup, Tessalon, and PPI and antihistamine. Unimproved Ongoing symptoms since December 2012 URI Status post 2 weeks Avelox February 2013 with only brief improvement as prescribed by ENT Complains of continued productive sputum and chest congestion  Associated with postnasal drip and sinus pressure Denies fever, reflux or weight loss No hemoptysis, shortness of breath but occasional wheeze   Also continued knee pain. Now seeing orthopedics for same with MRI planned. Request refill on meloxicam as this medication provides significant pain relief  Past Medical History  Diagnosis Date  . Sleep apnea   . Depression   . Hyperlipidemia   . Anxiety state, unspecified   . GERD   . INSOMNIA   . HYPOTHYROIDISM, POST-RADIOACTIVE IODINE   . ORGANIC IMPOTENCE   . OSTEOPOROSIS      Review of Systems  Constitutional: Negative for fever, chills and fatigue.  HENT: Positive for postnasal drip. Negative for nosebleeds and congestion.   Respiratory: Positive for cough and wheezing. Negative for shortness of breath and stridor.   Cardiovascular: Negative for chest pain and leg swelling.  Musculoskeletal: Negative for myalgias.       Objective:   Physical Exam BP 146/90  Pulse 83  Temp(Src) 97.1 F (36.2 C) (Oral)  SpO2 96% Gen.: Nontoxic, no acute distress. Mildly coarse voice quality HENT: No sinus tenderness, nares clear. Oropharynx status post uvuloplasty. Mild erythema with evidence of cobblestoning. No exudate. TMs clear without effusion Eyes: No conjunctivitis. PERRLA. Wears corrective lenses Neck: Supple, no LAD or JVD. Lungs: Scattered rhonchi with coughing effort. Mild end expiratory wheeze at left base. No increased work of breathing Cardiovascular: Regular rate and rhythm,  no edema  Dg Chest 2 View  10/16/2011  *RADIOLOGY REPORT*  Clinical Data: Chronic cough  CHEST - 2 VIEW  Comparison: 09/23/2010  Findings: Chronic interstitial markings.  Mild right basilar opacity, likely atelectasis. No pleural effusion or pneumothorax.  Cardiomediastinal silhouette is within normal limits.  Degenerative changes of the visualized thoracolumbar spine.  IMPRESSION: No evidence of acute cardiopulmonary disease.  Original Report Authenticated By: Charline Bills, M.D.   Dg Knee Complete 4 Views Right  10/16/2011  *RADIOLOGY REPORT*  Clinical Data: Right knee pain and instability.  No recent injuries.  RIGHT KNEE - COMPLETE 4+ VIEW 10/16/2011:  Comparison: None.  Findings: No evidence of acute, subacute, or healed fractures. Patellofemoral joint space narrowing and associated hypertrophic spurring involving the undersurface of the patella.  Medial and lateral compartment joint spaces well preserved.  Spur at the insertion of the anterior cruciate ligament on the tibia.  Small to moderate sized joint effusion.  Calcified loose bodies within the joint.  Well-preserved bone mineral density.  IMPRESSION: Patellofemoral compartment joint space narrowing which can be seen in CPPD arthropathy.  Small to moderate-sized joint effusion with calcified intra-articular loose bodies.  Spur at the insertion of the anterior cruciate ligament on the tibia.  Original Report Authenticated By: Arnell Sieving, M.D.       Assessment & Plan:   Cough with sputum, recent chest x-ray without cardiopulmonary disease. Describes element of wheezing, question reactive airway disease. Will retreat with empiric antibiotics, refill hydrocodone cough suppression and use albuterol rescue inhaler as needed for wheeze. Also continue treatment of underlying allergies and reflux - reviewed chest  x-ray from March 7. If persisting symptoms, patient to call for referral to pulmonary.  Knee pain. Status post steroid injection  March 7 without significant change. Agree with ongoing treatment plans and MRI as ordered by orthopedic. Continue meloxicam, refill provided

## 2011-11-14 DIAGNOSIS — M25569 Pain in unspecified knee: Secondary | ICD-10-CM | POA: Diagnosis not present

## 2011-11-17 ENCOUNTER — Other Ambulatory Visit: Payer: Self-pay | Admitting: *Deleted

## 2011-11-17 MED ORDER — HYDROCODONE-HOMATROPINE 5-1.5 MG/5ML PO SYRP: 5.0000 mL | ORAL_SOLUTION | Freq: Every day | ORAL | Status: AC

## 2011-11-18 NOTE — Telephone Encounter (Signed)
[facility]... 11/18/11@8 :45am/LMB

## 2011-11-21 DIAGNOSIS — M25569 Pain in unspecified knee: Secondary | ICD-10-CM | POA: Diagnosis not present

## 2011-12-16 DIAGNOSIS — M9420 Chondromalacia, unspecified site: Secondary | ICD-10-CM | POA: Diagnosis not present

## 2011-12-16 DIAGNOSIS — M23305 Other meniscus derangements, unspecified medial meniscus, unspecified knee: Secondary | ICD-10-CM | POA: Diagnosis not present

## 2011-12-16 DIAGNOSIS — M2240 Chondromalacia patellae, unspecified knee: Secondary | ICD-10-CM | POA: Diagnosis not present

## 2011-12-16 DIAGNOSIS — M23302 Other meniscus derangements, unspecified lateral meniscus, unspecified knee: Secondary | ICD-10-CM | POA: Diagnosis not present

## 2011-12-16 DIAGNOSIS — M659 Synovitis and tenosynovitis, unspecified: Secondary | ICD-10-CM | POA: Diagnosis not present

## 2011-12-16 DIAGNOSIS — S83289A Other tear of lateral meniscus, current injury, unspecified knee, initial encounter: Secondary | ICD-10-CM | POA: Diagnosis not present

## 2011-12-16 DIAGNOSIS — IMO0002 Reserved for concepts with insufficient information to code with codable children: Secondary | ICD-10-CM | POA: Diagnosis not present

## 2011-12-22 DIAGNOSIS — M25569 Pain in unspecified knee: Secondary | ICD-10-CM | POA: Diagnosis not present

## 2011-12-24 DIAGNOSIS — S83289A Other tear of lateral meniscus, current injury, unspecified knee, initial encounter: Secondary | ICD-10-CM | POA: Diagnosis not present

## 2012-01-01 DIAGNOSIS — S83289A Other tear of lateral meniscus, current injury, unspecified knee, initial encounter: Secondary | ICD-10-CM | POA: Diagnosis not present

## 2012-01-07 ENCOUNTER — Telehealth: Payer: Self-pay | Admitting: *Deleted

## 2012-01-07 NOTE — Telephone Encounter (Signed)
Message copied by Carin Primrose on Wed Jan 07, 2012  4:37 PM ------      Message from: Newell Coral      Created: Wed Dec 24, 2011 12:42 PM      Regarding: cpe sch, needs labs       The pt sch a cpe, and needs labs.

## 2012-01-07 NOTE — Telephone Encounter (Signed)
Pt has Medicare and labs need to scheduled at OV.

## 2012-01-20 DIAGNOSIS — S83289A Other tear of lateral meniscus, current injury, unspecified knee, initial encounter: Secondary | ICD-10-CM | POA: Diagnosis not present

## 2012-01-21 ENCOUNTER — Ambulatory Visit (INDEPENDENT_AMBULATORY_CARE_PROVIDER_SITE_OTHER): Payer: Medicare Other | Admitting: Endocrinology

## 2012-01-21 ENCOUNTER — Other Ambulatory Visit: Payer: Medicare Other

## 2012-01-21 ENCOUNTER — Encounter: Payer: Self-pay | Admitting: Endocrinology

## 2012-01-21 VITALS — BP 122/90 | HR 85 | Temp 98.1°F | Ht 70.0 in | Wt 191.0 lb

## 2012-01-21 DIAGNOSIS — Z125 Encounter for screening for malignant neoplasm of prostate: Secondary | ICD-10-CM | POA: Diagnosis not present

## 2012-01-21 DIAGNOSIS — F329 Major depressive disorder, single episode, unspecified: Secondary | ICD-10-CM

## 2012-01-21 DIAGNOSIS — R059 Cough, unspecified: Secondary | ICD-10-CM | POA: Diagnosis not present

## 2012-01-21 DIAGNOSIS — Z0000 Encounter for general adult medical examination without abnormal findings: Secondary | ICD-10-CM

## 2012-01-21 DIAGNOSIS — R05 Cough: Secondary | ICD-10-CM

## 2012-01-21 DIAGNOSIS — F3289 Other specified depressive episodes: Secondary | ICD-10-CM | POA: Diagnosis not present

## 2012-01-21 DIAGNOSIS — Z136 Encounter for screening for cardiovascular disorders: Secondary | ICD-10-CM | POA: Diagnosis not present

## 2012-01-21 LAB — PSA, MEDICARE: PSA: 3.61 ng/mL (ref <=4.00)

## 2012-01-21 MED ORDER — ATORVASTATIN CALCIUM 40 MG PO TABS: ORAL_TABLET | ORAL | Status: DC

## 2012-01-21 MED ORDER — VENLAFAXINE HCL ER 75 MG PO CP24: 75.0000 mg | ORAL_CAPSULE | Freq: Every day | ORAL | Status: DC

## 2012-01-21 MED ORDER — CLONAZEPAM 1 MG PO TABS: 1.0000 mg | ORAL_TABLET | Freq: Three times a day (TID) | ORAL | Status: DC | PRN

## 2012-01-21 MED ORDER — OMEPRAZOLE 40 MG PO CPDR: 40.0000 mg | DELAYED_RELEASE_CAPSULE | Freq: Every day | ORAL | Status: DC

## 2012-01-21 NOTE — Progress Notes (Signed)
Subjective:    Patient ID: Bryan Tyler, male    DOB: Aug 15, 1945, 66 y.o.   MRN: [id number]  HPI Pt states 6 mos of slight prod-quality cough, but no assoc wheezing.  Non-smoker.  No help with claritin/prilosec.  He does not take flonase consistently. Past Medical History  Diagnosis Date  . Sleep apnea   . Depression   . Hyperlipidemia   . Anxiety state, unspecified   . GERD   . INSOMNIA   . HYPOTHYROIDISM, POST-RADIOACTIVE IODINE   . ORGANIC IMPOTENCE   . OSTEOPOROSIS     Past Surgical History  Procedure Date  . Throat surgery   . Nasal septum surgery   . Rotator cuff repair   . Hernia repair     Inguinal Herniorrhapy-multiple bilateral  . Rubber band ligature of hemorrhoids ([place]) 2007  . Uvulopalatoplasty     History   Social History  . Marital Status: Married    Spouse Name: N/A    Number of Children: N/A  . Years of Education: N/A   Occupational History  . Middle School Teacher    Social History Main Topics  . Smoking status: Never Smoker   . Smokeless tobacco: Never Used  . Alcohol Use: Yes  . Drug Use: No  . Sexually Active: Not on file   Other Topics Concern  . Not on file   Social History Narrative  . No narrative on file    Current Outpatient Prescriptions on File Prior to Visit  Medication Sig Dispense Refill  . albuterol (PROVENTIL HFA;VENTOLIN HFA) 108 (90 BASE) MCG/ACT inhaler Inhale 2 puffs into the lungs every 6 (six) hours as needed for wheezing.  1 Inhaler  0  . atorvastatin (LIPITOR) 40 MG tablet TAKE 1 TABLET EVERY DAY  90 tablet  3  . clonazePAM (KLONOPIN) 1 MG tablet Take 1 tablet (1 mg total) by mouth 3 (three) times daily as needed.  90 tablet  1  . fluticasone (FLONASE) 50 MCG/ACT nasal spray Place 2 sprays into the nose daily.  16 g  2  . levothyroxine (SYNTHROID, LEVOTHROID) 112 MCG tablet TAKE 1 TABLET BY MOUTH EVERY DAY  90 tablet  3  . loratadine (CLARITIN) 10 MG tablet Take 1 tablet (10 mg total) by mouth daily.  30  tablet  11  . meloxicam (MOBIC) 15 MG tablet Take 1 tablet (15 mg total) by mouth daily. X 1 week, then as needed for knee pain  30 tablet  0  . omeprazole (PRILOSEC) 40 MG capsule Take 1 capsule (40 mg total) by mouth daily.  90 capsule  3  . venlafaxine XR (EFFEXOR-XR) 75 MG 24 hr capsule Take 1 capsule (75 mg total) by mouth daily.  90 capsule  3    Allergies  Allergen Reactions  . Codeine     Family History  Problem Relation Age of Onset  . Cancer Mother     Pancreatic Cancer  . Heart disease Father 38    CABG    BP 122/90  Pulse 85  Temp 98.1 F (36.7 C) (Oral)  Ht 5\' 10"  (1.778 m)  Wt 191 lb (86.637 kg)  BMI 27.41 kg/m2  SpO2 97%  Review of Systems Denies chest pain.  Depression persists, despite 2 meds (he feels he has had this since childhood).      Objective:   Physical Exam VITAL SIGNS:  See vs page GENERAL: no distress LUNGS:  Clear to auscultation.      Assessment & Plan:  Allergic rhinitis, persistent Cough, ? Secondary to allergic rhinitis.  New Depression, persistent   Subjective:   Patient here for Medicare annual wellness visit and management of other chronic and acute problems.     Risk factors: advanced age    Roster of Physicians Providing Medical Care to Patient:  See "snapshot"   Activities of Daily Living: In your present state of health, do you have any difficulty performing the following activities?:  Preparing food and eating?: No  Bathing yourself: No  Getting dressed: No  Using the toilet:No  Moving around from place to place: No  In the past year have you fallen or had a near fall?: No    Home Safety: Has smoke detector and wears seat belts. No firearms. No excess sun exposure.  Diet and Exercise  Current exercise habits:  Pt says good Dietary issues discussed: pt reports a healthy diet   Depression Screen  Q1: Over the past two weeks, have you felt down, depressed or hopeless? yes Q2: Over the past two weeks, have you  felt little interest or pleasure in doing things? no   The following portions of the patient's history were reviewed and updated as appropriate: allergies, current medications, past family history, past medical history, past social history, past surgical history and problem list.   Review of Systems  hearing loss is unchanged.  Denies visual loss Objective:   Vision:  Sees opthalmologist Hearing: grossly normal Body mass index:  See vs page Msk: pt easily and quickly performs "get-up-and-go" from a sitting position Cognitive Impairment Assessment: cognition, memory and judgment appear normal.  remembers 3/3 at 5 minutes.  excellent recall.  can easily read and write a sentence.  alert and oriented x 3.   Assessment:   Medicare wellness utd on preventive parameters    Plan:   During the course of the visit the patient was educated and counseled about appropriate screening and preventive services including:        Fall prevention    Diabetes screening  Nutrition counseling   Vaccines / LABS Zostavax / Pnemonccoal Vaccine  today  PSA  Patient Instructions (the written plan) was given to the patient.

## 2012-01-21 NOTE — Patient Instructions (Addendum)
Take fluticasone nasal spray daily.  Call if this doesn't help--other options are changing claritin to claritin-d, and trying an inhaler.   Refer to a psychiatry specialist.  you will receive a phone call, about a day and time for an appointment.   please consider these measures for your health:  minimize alcohol.  do not use tobacco products.  have a colonoscopy at least every 10 years from age 66.  keep firearms safely stored.  always use seat belts.  have working smoke alarms in your home.  see an eye doctor and dentist regularly.  never drive under the influence of alcohol or drugs (including prescription drugs).  those with fair skin should take precautions against the sun. please let me know what your wishes would be, if artificial life support measures should become necessary.  it is critically important to prevent falling down (keep floor areas well-lit, dry, and free of loose objects.  If you have a cane, walker, or wheelchair, you should use it, even for short trips around the house.  Also, try not to rush).   Please return in 6 months. (update: we discussed code status.  pt requests full code, but would not want to be started or maintained on artificial life-support measures if there was not a reasonable chance of recovery)

## 2012-01-22 ENCOUNTER — Telehealth: Payer: Self-pay | Admitting: *Deleted

## 2012-01-22 ENCOUNTER — Encounter: Payer: Self-pay | Admitting: Endocrinology

## 2012-01-22 NOTE — Telephone Encounter (Signed)
Called pt to inform of lab results, pt informed (letter also mailed to pt). 

## 2012-01-29 DIAGNOSIS — S83289A Other tear of lateral meniscus, current injury, unspecified knee, initial encounter: Secondary | ICD-10-CM | POA: Diagnosis not present

## 2012-02-20 DIAGNOSIS — J984 Other disorders of lung: Secondary | ICD-10-CM | POA: Diagnosis not present

## 2012-02-20 DIAGNOSIS — R918 Other nonspecific abnormal finding of lung field: Secondary | ICD-10-CM | POA: Diagnosis not present

## 2012-02-27 DIAGNOSIS — IMO0002 Reserved for concepts with insufficient information to code with codable children: Secondary | ICD-10-CM | POA: Diagnosis not present

## 2012-02-27 DIAGNOSIS — M1710 Unilateral primary osteoarthritis, unspecified knee: Secondary | ICD-10-CM | POA: Diagnosis not present

## 2012-03-23 DIAGNOSIS — N401 Enlarged prostate with lower urinary tract symptoms: Secondary | ICD-10-CM | POA: Diagnosis not present

## 2012-03-23 DIAGNOSIS — J984 Other disorders of lung: Secondary | ICD-10-CM | POA: Diagnosis not present

## 2012-03-30 DIAGNOSIS — H6980 Other specified disorders of Eustachian tube, unspecified ear: Secondary | ICD-10-CM | POA: Diagnosis not present

## 2012-03-30 DIAGNOSIS — H905 Unspecified sensorineural hearing loss: Secondary | ICD-10-CM | POA: Diagnosis not present

## 2012-03-30 DIAGNOSIS — G4733 Obstructive sleep apnea (adult) (pediatric): Secondary | ICD-10-CM | POA: Diagnosis not present

## 2012-03-31 DIAGNOSIS — M1710 Unilateral primary osteoarthritis, unspecified knee: Secondary | ICD-10-CM | POA: Diagnosis not present

## 2012-03-31 DIAGNOSIS — IMO0002 Reserved for concepts with insufficient information to code with codable children: Secondary | ICD-10-CM | POA: Diagnosis not present

## 2012-04-07 DIAGNOSIS — IMO0002 Reserved for concepts with insufficient information to code with codable children: Secondary | ICD-10-CM | POA: Diagnosis not present

## 2012-04-07 DIAGNOSIS — M1710 Unilateral primary osteoarthritis, unspecified knee: Secondary | ICD-10-CM | POA: Diagnosis not present

## 2012-04-14 DIAGNOSIS — IMO0002 Reserved for concepts with insufficient information to code with codable children: Secondary | ICD-10-CM | POA: Diagnosis not present

## 2012-04-14 DIAGNOSIS — M1710 Unilateral primary osteoarthritis, unspecified knee: Secondary | ICD-10-CM | POA: Diagnosis not present

## 2012-04-18 DIAGNOSIS — Z23 Encounter for immunization: Secondary | ICD-10-CM | POA: Diagnosis not present

## 2012-04-19 ENCOUNTER — Telehealth: Payer: Self-pay | Admitting: *Deleted

## 2012-04-19 MED ORDER — PNEUMOCOCCAL VAC POLYVALENT 25 MCG/0.5ML IJ INJ: 0.5000 mL | INJECTION | Freq: Once | INTRAMUSCULAR | Status: DC

## 2012-04-19 NOTE — Telephone Encounter (Signed)
R'cd fax from [facility] for rx for Pneumonia vaccine-pt would like to get immunization at pharmacy.

## 2012-04-19 NOTE — Telephone Encounter (Signed)
i sent rx 

## 2012-04-21 DIAGNOSIS — M1710 Unilateral primary osteoarthritis, unspecified knee: Secondary | ICD-10-CM | POA: Diagnosis not present

## 2012-04-21 DIAGNOSIS — IMO0002 Reserved for concepts with insufficient information to code with codable children: Secondary | ICD-10-CM | POA: Diagnosis not present

## 2012-04-28 DIAGNOSIS — M1710 Unilateral primary osteoarthritis, unspecified knee: Secondary | ICD-10-CM | POA: Diagnosis not present

## 2012-04-28 DIAGNOSIS — IMO0002 Reserved for concepts with insufficient information to code with codable children: Secondary | ICD-10-CM | POA: Diagnosis not present

## 2012-05-10 DIAGNOSIS — H905 Unspecified sensorineural hearing loss: Secondary | ICD-10-CM | POA: Diagnosis not present

## 2012-05-10 DIAGNOSIS — H903 Sensorineural hearing loss, bilateral: Secondary | ICD-10-CM | POA: Diagnosis not present

## 2012-05-31 ENCOUNTER — Telehealth: Payer: Self-pay | Admitting: Endocrinology

## 2012-05-31 DIAGNOSIS — K649 Unspecified hemorrhoids: Secondary | ICD-10-CM

## 2012-05-31 NOTE — Telephone Encounter (Signed)
Pt would like a referral to a doctor that handles hemorrhoids. He states that he saw that there's a Dr. Cheree Ditto that treats, but he isn't sure if this is who Dr. Everardo All recommends? Please call pt once completed; called once last week and no one returned call.

## 2012-05-31 NOTE — Telephone Encounter (Signed)
Pt wife notified of the referral being placed. Advised that someone will be calling to set up date and time.

## 2012-05-31 NOTE — Telephone Encounter (Signed)
Referral sent 

## 2012-06-14 ENCOUNTER — Ambulatory Visit (INDEPENDENT_AMBULATORY_CARE_PROVIDER_SITE_OTHER): Payer: Medicare Other | Admitting: General Surgery

## 2012-06-14 ENCOUNTER — Encounter (INDEPENDENT_AMBULATORY_CARE_PROVIDER_SITE_OTHER): Payer: Self-pay | Admitting: General Surgery

## 2012-06-14 VITALS — BP 136/82 | HR 62 | Temp 98.0°F | Resp 18 | Ht 70.0 in | Wt 189.0 lb

## 2012-06-14 DIAGNOSIS — K649 Unspecified hemorrhoids: Secondary | ICD-10-CM | POA: Diagnosis not present

## 2012-06-14 DIAGNOSIS — L290 Pruritus ani: Secondary | ICD-10-CM | POA: Diagnosis not present

## 2012-06-14 NOTE — Patient Instructions (Signed)
You have internal hemorrhoids, and they may be contributing to your symptoms. You have been given a prescription for Analpram-HC 2.5% cream to put up inside your anal canal twice a day for 10 days.  Your bigger problem seems to be the itching, which I think is due to a mild case of pruritus ani. Please follow the instructions that were given to you very carefully, and the itching should resolve within 3 weeks or so.  If the itching persists, we will need to refer you to a dermatologist.

## 2012-06-14 NOTE — Progress Notes (Signed)
Patient ID: Bryan Tyler, male   DOB: February 01, 1946, 66 y.o.   MRN: [id number]  Chief Complaint  Patient presents with  . New Evaluation    hems    HPI Bryan Tyler is a 66 y.o. male.  He returns to see me at the request of Dr. Everardo All for rectal problems, mostly itching.  He  has a history of rubber band ligation of hemorrhoids many years ago in [place]. He says he was told he had a fissure at that time.  I saw him in June of 2012 at which time he was having some discomfort but no bleeding. He had internal hemorrhoids which I injected. I described a mild case of pruritus ani.  Now itching is a bigger problem. He denies bleeding but he has lots of moisture and is difficult to clean and he scrubs the anal area a lot try to clean off. He has no bleeding. He has no external mass or prolapse. Feels a little bit of pressure on the left side.  Last colonoscopy was 2009.  Other medical problems including sleep apnea, depression anxiety, GERD, hypothyroidism post RAI, and multiple hernia surgeries. HPI  Past Medical History  Diagnosis Date  . Sleep apnea   . Depression   . Hyperlipidemia   . Anxiety state, unspecified   . GERD   . INSOMNIA   . HYPOTHYROIDISM, POST-RADIOACTIVE IODINE   . ORGANIC IMPOTENCE   . OSTEOPOROSIS     Past Surgical History  Procedure Date  . Throat surgery   . Nasal septum surgery   . Rotator cuff repair   . Hernia repair     Inguinal Herniorrhapy-multiple bilateral  . Rubber band ligature of hemorrhoids ([place]) 2007  . Uvulopalatoplasty     Family History  Problem Relation Age of Onset  . Cancer Mother     Pancreatic Cancer  . Heart disease Father 49    CABG    Social History History  Substance Use Topics  . Smoking status: Never Smoker   . Smokeless tobacco: Never Used  . Alcohol Use: Yes    Allergies  Allergen Reactions  . Codeine     Current Outpatient Prescriptions  Medication Sig Dispense Refill  . albuterol (PROVENTIL  HFA;VENTOLIN HFA) 108 (90 BASE) MCG/ACT inhaler Inhale 2 puffs into the lungs every 6 (six) hours as needed for wheezing.  1 Inhaler  0  . atorvastatin (LIPITOR) 40 MG tablet TAKE 1 TABLET EVERY DAY  90 tablet  3  . clonazePAM (KLONOPIN) 1 MG tablet Take 1 tablet (1 mg total) by mouth 3 (three) times daily as needed.  90 tablet  1  . fluticasone (FLONASE) 50 MCG/ACT nasal spray Place 2 sprays into the nose daily.  16 g  2  . levothyroxine (SYNTHROID, LEVOTHROID) 112 MCG tablet TAKE 1 TABLET BY MOUTH EVERY DAY  90 tablet  3  . loratadine (CLARITIN) 10 MG tablet Take 1 tablet (10 mg total) by mouth daily.  30 tablet  11  . omeprazole (PRILOSEC) 40 MG capsule Take 1 capsule (40 mg total) by mouth daily.  90 capsule  3  . venlafaxine XR (EFFEXOR-XR) 75 MG 24 hr capsule Take 1 capsule (75 mg total) by mouth daily.  90 capsule  3  . meloxicam (MOBIC) 15 MG tablet Take 1 tablet (15 mg total) by mouth daily. X 1 week, then as needed for knee pain  30 tablet  0  . pneumococcal 23 valent vaccine (PNEUMOVAX 23) 25 MCG/0.5ML injection Inject  0.5 mLs into the muscle once.  2.5 mL  0    Review of Systems Review of Systems  Constitutional: Negative for fever, chills and unexpected weight change.  HENT: Negative for hearing loss, congestion, sore throat, trouble swallowing and voice change.   Eyes: Negative for visual disturbance.  Respiratory: Negative for cough and wheezing.   Cardiovascular: Negative for chest pain, palpitations and leg swelling.  Gastrointestinal: Negative for nausea, vomiting, abdominal pain, diarrhea, constipation, blood in stool, abdominal distention, anal bleeding and rectal pain.  Genitourinary: Negative for hematuria and difficulty urinating.  Musculoskeletal: Negative for arthralgias.  Skin: Negative for rash and wound.  Neurological: Negative for seizures, syncope, weakness and headaches.  Hematological: Negative for adenopathy. Does not bruise/bleed easily.    Psychiatric/Behavioral: Negative for confusion.    Blood pressure 136/82, pulse 62, temperature 98 F (36.7 C), temperature source Temporal, resp. rate 18, height 5\' 10"  (1.778 m), weight 189 lb (85.73 kg).  Physical Exam Physical Exam  Constitutional: He is oriented to person, place, and time. He appears well-developed and well-nourished. No distress.  HENT:  Head: Normocephalic.  Eyes: Conjunctivae normal are normal. Right eye exhibits no discharge. Left eye exhibits no discharge.  Neck: Normal range of motion. Neck supple. No JVD present. No tracheal deviation present. No thyromegaly present.  Cardiovascular: Normal rate, regular rhythm, normal heart sounds and intact distal pulses.   No murmur heard. Pulmonary/Chest: Effort normal and breath sounds normal. No stridor. No respiratory distress. He has no wheezes. He has no rales. He exhibits no tenderness.  Abdominal: Soft. Bowel sounds are normal. He exhibits no distension and no mass. There is no tenderness. There is no rebound and no guarding.       Multiple inguinal and lower abdominal scars. No hernia.  Genitourinary:       Pruritus ani  present. No ulcers or fissures. No external hemorrhoids. No fistula. No cellulitis. No fissure. Digital exam reveals normal sphincter tone, good squeeze, no real discomfort. Endoscopy revealed small internal hemorrhoids, no blood, no mass.  Musculoskeletal: Normal range of motion. He exhibits no edema and no tenderness.  Lymphadenopathy:    He has no cervical adenopathy.  Neurological: He is alert and oriented to person, place, and time. He has normal reflexes. Coordination normal.  Skin: Skin is warm and dry. No rash noted. He is not diaphoretic. No erythema. No pallor.  Psychiatric: He has a normal mood and affect. His behavior is normal. Judgment and thought content normal.    Data Reviewed Old office records.  Assessment    Pruritus a 9  Internal hemorrhoids, minimally  symptomatic  Sleep apnea  GERD  History depression anxiety  History of multiple inguinal and lower bowel hernia surgeries.    Plan    Treat pruritus and aggressively. He was given the patient's structure but I would do this with him point by point. Suspect this is the most important intervention  For internal hemorrhoids I gave him some Analpram-HC 2.5% cream to use twice a day for 10 days  If the itching persist, it would probably be a good idea to get a dermatologic consultation.  I told him that he needed a colonoscopy in 2014, and asked Dr. Everardo All to arrange that. He said he would do that  Return to see me when necessary       Janille Draughon M 06/14/2012, 3:14 PM

## 2012-06-23 DIAGNOSIS — IMO0002 Reserved for concepts with insufficient information to code with codable children: Secondary | ICD-10-CM | POA: Diagnosis not present

## 2012-06-23 DIAGNOSIS — M1710 Unilateral primary osteoarthritis, unspecified knee: Secondary | ICD-10-CM | POA: Diagnosis not present

## 2012-07-04 ENCOUNTER — Other Ambulatory Visit: Payer: Self-pay | Admitting: Endocrinology

## 2012-07-05 ENCOUNTER — Other Ambulatory Visit: Payer: Self-pay | Admitting: *Deleted

## 2012-07-05 NOTE — Telephone Encounter (Signed)
PATIENT DR. ELLISON REQUEST REFILL ON MEDICATION, CLONAZEPAM, PLEASE ADVISE. SUE

## 2012-07-05 NOTE — Telephone Encounter (Signed)
Refill  Rx phoned in to [facility] pharmacy.

## 2012-09-20 ENCOUNTER — Other Ambulatory Visit: Payer: Self-pay

## 2012-09-20 MED ORDER — LEVOTHYROXINE SODIUM 112 MCG PO TABS: 112.0000 ug | ORAL_TABLET | Freq: Every day | ORAL | Status: DC

## 2012-10-04 ENCOUNTER — Other Ambulatory Visit: Payer: Self-pay | Admitting: Internal Medicine

## 2012-10-05 MED ORDER — CLONAZEPAM 1 MG PO TABS: 1.0000 mg | ORAL_TABLET | Freq: Three times a day (TID) | ORAL | Status: DC | PRN

## 2012-10-05 NOTE — Addendum Note (Signed)
Addended by: Romero Belling on: 10/05/2012 07:17 AM   Modules accepted: Orders

## 2012-10-05 NOTE — Telephone Encounter (Signed)
Dr George Hugh pt - will route to him.

## 2012-10-05 NOTE — Telephone Encounter (Signed)
i printed rx. Ov is due 

## 2012-10-05 NOTE — Telephone Encounter (Signed)
Will route to Dr. Everardo All as he is the PCP.

## 2012-10-06 ENCOUNTER — Other Ambulatory Visit: Payer: Self-pay

## 2012-10-06 MED ORDER — LEVOTHYROXINE SODIUM 112 MCG PO TABS: 112.0000 ug | ORAL_TABLET | Freq: Every day | ORAL | Status: DC

## 2012-10-18 DIAGNOSIS — M1710 Unilateral primary osteoarthritis, unspecified knee: Secondary | ICD-10-CM | POA: Diagnosis not present

## 2012-10-18 DIAGNOSIS — M25569 Pain in unspecified knee: Secondary | ICD-10-CM | POA: Diagnosis not present

## 2012-10-18 DIAGNOSIS — IMO0002 Reserved for concepts with insufficient information to code with codable children: Secondary | ICD-10-CM | POA: Diagnosis not present

## 2012-11-15 DIAGNOSIS — L820 Inflamed seborrheic keratosis: Secondary | ICD-10-CM | POA: Diagnosis not present

## 2012-11-15 DIAGNOSIS — D235 Other benign neoplasm of skin of trunk: Secondary | ICD-10-CM | POA: Diagnosis not present

## 2012-11-15 DIAGNOSIS — L570 Actinic keratosis: Secondary | ICD-10-CM | POA: Diagnosis not present

## 2012-11-15 DIAGNOSIS — R209 Unspecified disturbances of skin sensation: Secondary | ICD-10-CM | POA: Diagnosis not present

## 2012-11-22 DIAGNOSIS — M25569 Pain in unspecified knee: Secondary | ICD-10-CM | POA: Diagnosis not present

## 2012-11-22 DIAGNOSIS — IMO0002 Reserved for concepts with insufficient information to code with codable children: Secondary | ICD-10-CM | POA: Diagnosis not present

## 2012-11-22 DIAGNOSIS — M1710 Unilateral primary osteoarthritis, unspecified knee: Secondary | ICD-10-CM | POA: Diagnosis not present

## 2012-11-29 DIAGNOSIS — M1710 Unilateral primary osteoarthritis, unspecified knee: Secondary | ICD-10-CM | POA: Diagnosis not present

## 2012-11-29 DIAGNOSIS — IMO0002 Reserved for concepts with insufficient information to code with codable children: Secondary | ICD-10-CM | POA: Diagnosis not present

## 2012-12-06 DIAGNOSIS — IMO0002 Reserved for concepts with insufficient information to code with codable children: Secondary | ICD-10-CM | POA: Diagnosis not present

## 2012-12-06 DIAGNOSIS — M1710 Unilateral primary osteoarthritis, unspecified knee: Secondary | ICD-10-CM | POA: Diagnosis not present

## 2012-12-15 DIAGNOSIS — M1710 Unilateral primary osteoarthritis, unspecified knee: Secondary | ICD-10-CM | POA: Diagnosis not present

## 2012-12-15 DIAGNOSIS — IMO0002 Reserved for concepts with insufficient information to code with codable children: Secondary | ICD-10-CM | POA: Diagnosis not present

## 2012-12-22 DIAGNOSIS — IMO0002 Reserved for concepts with insufficient information to code with codable children: Secondary | ICD-10-CM | POA: Diagnosis not present

## 2012-12-22 DIAGNOSIS — M1710 Unilateral primary osteoarthritis, unspecified knee: Secondary | ICD-10-CM | POA: Diagnosis not present

## 2012-12-22 DIAGNOSIS — S82899A Other fracture of unspecified lower leg, initial encounter for closed fracture: Secondary | ICD-10-CM | POA: Diagnosis not present

## 2013-01-05 ENCOUNTER — Other Ambulatory Visit: Payer: Self-pay

## 2013-01-05 MED ORDER — CLONAZEPAM 1 MG PO TABS: 1.0000 mg | ORAL_TABLET | Freq: Three times a day (TID) | ORAL | Status: DC | PRN

## 2013-01-07 ENCOUNTER — Other Ambulatory Visit: Payer: Self-pay | Admitting: *Deleted

## 2013-01-07 NOTE — Telephone Encounter (Signed)
Opened encounter in error  

## 2013-01-24 ENCOUNTER — Other Ambulatory Visit: Payer: Self-pay | Admitting: Endocrinology

## 2013-01-24 ENCOUNTER — Encounter: Payer: Self-pay | Admitting: Endocrinology

## 2013-01-24 ENCOUNTER — Ambulatory Visit (INDEPENDENT_AMBULATORY_CARE_PROVIDER_SITE_OTHER): Payer: Medicare Other | Admitting: Endocrinology

## 2013-01-24 VITALS — BP 128/76 | HR 83 | Ht 70.0 in | Wt 191.0 lb

## 2013-01-24 DIAGNOSIS — E018 Other iodine-deficiency related thyroid disorders and allied conditions: Secondary | ICD-10-CM

## 2013-01-24 DIAGNOSIS — M810 Age-related osteoporosis without current pathological fracture: Secondary | ICD-10-CM

## 2013-01-24 DIAGNOSIS — R9431 Abnormal electrocardiogram [ECG] [EKG]: Secondary | ICD-10-CM

## 2013-01-24 DIAGNOSIS — F329 Major depressive disorder, single episode, unspecified: Secondary | ICD-10-CM

## 2013-01-24 DIAGNOSIS — Z79899 Other long term (current) drug therapy: Secondary | ICD-10-CM | POA: Diagnosis not present

## 2013-01-24 DIAGNOSIS — Z0000 Encounter for general adult medical examination without abnormal findings: Secondary | ICD-10-CM

## 2013-01-24 DIAGNOSIS — E785 Hyperlipidemia, unspecified: Secondary | ICD-10-CM | POA: Diagnosis not present

## 2013-01-24 DIAGNOSIS — R972 Elevated prostate specific antigen [PSA]: Secondary | ICD-10-CM | POA: Insufficient documentation

## 2013-01-24 DIAGNOSIS — F3289 Other specified depressive episodes: Secondary | ICD-10-CM

## 2013-01-24 DIAGNOSIS — Z125 Encounter for screening for malignant neoplasm of prostate: Secondary | ICD-10-CM

## 2013-01-24 LAB — URINALYSIS, ROUTINE W REFLEX MICROSCOPIC
Hgb urine dipstick: NEGATIVE
Urine Glucose: NEGATIVE
pH: 6 (ref 5.0–8.0)

## 2013-01-24 LAB — PSA, MEDICARE: PSA: 4.39 ng/ml — ABNORMAL HIGH (ref 0.10–4.00)

## 2013-01-24 LAB — BASIC METABOLIC PANEL
CO2: 31 mEq/L (ref 19–32)
Calcium: 9.2 mg/dL (ref 8.4–10.5)
Creatinine, Ser: 1 mg/dL (ref 0.4–1.5)
GFR: 82.71 mL/min (ref >60.00)
Glucose, Bld: 89 mg/dL (ref 70–99)

## 2013-01-24 LAB — HEPATIC FUNCTION PANEL
ALT: 18 U/L (ref 0–53)
AST: 22 U/L (ref 0–37)
Albumin: 3.9 g/dL (ref 3.5–5.2)
Alkaline Phosphatase: 48 U/L (ref 39–117)

## 2013-01-24 LAB — LIPID PANEL
HDL: 51.4 mg/dL (ref >39.00)
Triglycerides: 65 mg/dL (ref 0.0–149.0)
VLDL: 13 mg/dL (ref 0.0–40.0)

## 2013-01-24 LAB — CBC WITH DIFFERENTIAL/PLATELET
Basophils Absolute: 0.1 10*3/uL (ref 0.0–0.1)
Basophils Relative: 0.9 % (ref 0.0–3.0)
Eosinophils Relative: 4.3 % (ref 0.0–5.0)
HCT: 45.9 % (ref 39.0–52.0)
Hemoglobin: 15.1 g/dL (ref 13.0–17.0)
Lymphocytes Relative: 32.3 % (ref 12.0–46.0)
Lymphs Abs: 2.1 10*3/uL (ref 0.7–4.0)
Monocytes Relative: 14.4 % — ABNORMAL HIGH (ref 3.0–12.0)
Neutro Abs: 3.1 10*3/uL (ref 1.4–7.7)
RBC: 4.89 Mil/uL (ref 4.22–5.81)
RDW: 13.8 % (ref 11.5–14.6)
WBC: 6.5 10*3/uL (ref 4.5–10.5)

## 2013-01-24 LAB — TSH: TSH: 0.59 u[IU]/mL (ref 0.35–5.50)

## 2013-01-24 MED ORDER — ATORVASTATIN CALCIUM 40 MG PO TABS: ORAL_TABLET | ORAL | Status: DC

## 2013-01-24 MED ORDER — OMEPRAZOLE 40 MG PO CPDR: 40.0000 mg | DELAYED_RELEASE_CAPSULE | Freq: Every day | ORAL | Status: DC

## 2013-01-24 MED ORDER — LEVOTHYROXINE SODIUM 112 MCG PO TABS: 112.0000 ug | ORAL_TABLET | Freq: Every day | ORAL | Status: DC

## 2013-01-24 MED ORDER — CLONAZEPAM 1 MG PO TABS: 1.0000 mg | ORAL_TABLET | Freq: Three times a day (TID) | ORAL | Status: DC | PRN

## 2013-01-24 MED ORDER — VENLAFAXINE HCL ER 75 MG PO CP24: 75.0000 mg | ORAL_CAPSULE | Freq: Every day | ORAL | Status: DC

## 2013-01-24 NOTE — Patient Instructions (Addendum)
please consider these measures for your health:  minimize alcohol.  do not use tobacco products.  have a colonoscopy at least every 10 years from age 67.  keep firearms safely stored.  always use seat belts.  have working smoke alarms in your home.  see an eye doctor and dentist regularly.  never drive under the influence of alcohol or drugs (including prescription drugs).  those with fair skin should take precautions against the sun.   it is critically important to prevent falling down (keep floor areas well-lit, dry, and free of loose objects.  If you have a cane, walker, or wheelchair, you should use it, even for short trips around the house.  Also, try not to rush).   blood tests are being requested for you today.  We'll contact you with results.  Refer to a heart specialist.  you will receive a phone call, about a day and time for an appointment. Also, Refer to a psychiatry specialist.  you will receive a phone call, about a day and time for an appointment Please return in 1 year.

## 2013-01-24 NOTE — Progress Notes (Signed)
Subjective:    Patient ID: Bryan Tyler, male    DOB: [birth year], 67 y.o.   MRN: [id number]  HPI depression: he has fatigue.  He wants to see a specialist, to further classify this condition. Dyslipidemia: henies weight change and chest pain.  However, he feels as though he has "blockage."  He had treadmill approx 10 years ago.  He says it was inconclusive.   osteoporosis: he took med for a few years in [place], approx 2005.   Past Medical History  Diagnosis Date  . Sleep apnea   . Depression   . Hyperlipidemia   . Anxiety state, unspecified   . GERD   . INSOMNIA   . HYPOTHYROIDISM, POST-RADIOACTIVE IODINE   . ORGANIC IMPOTENCE   . OSTEOPOROSIS     Past Surgical History  Procedure Laterality Date  . Throat surgery    . Nasal septum surgery    . Rotator cuff repair    . Hernia repair      Inguinal Herniorrhapy-multiple bilateral  . Rubber band ligature of hemorrhoids ([place])  2007  . Uvulopalatoplasty      History   Social History  . Marital Status: Married    Spouse Name: N/A    Number of Children: N/A  . Years of Education: N/A   Occupational History  . Middle School Teacher    Social History Main Topics  . Smoking status: Never Smoker   . Smokeless tobacco: Never Used  . Alcohol Use: Yes  . Drug Use: No  . Sexually Active: Not on file   Other Topics Concern  . Not on file   Social History Narrative  . No narrative on file    Current Outpatient Prescriptions on File Prior to Visit  Medication Sig Dispense Refill  . fluticasone (FLONASE) 50 MCG/ACT nasal spray Place 2 sprays into the nose daily.  16 g  2  . albuterol (PROVENTIL HFA;VENTOLIN HFA) 108 (90 BASE) MCG/ACT inhaler Inhale 2 puffs into the lungs every 6 (six) hours as needed for wheezing.  1 Inhaler  0  . loratadine (CLARITIN) 10 MG tablet Take 1 tablet (10 mg total) by mouth daily.  30 tablet  11   No current facility-administered medications on file prior to visit.    Allergies   Allergen Reactions  . Codeine     Family History  Problem Relation Age of Onset  . Cancer Mother     Pancreatic Cancer  . Heart disease Father 9    CABG    BP 128/76  Pulse 83  Ht 5\' 10"  (1.778 m)  Wt 191 lb (86.637 kg)  BMI 27.41 kg/m2  SpO2 98%    Review of Systems Denies sob.  Klonopin helps insomnia.      Objective:   Physical Exam VITAL SIGNS:  See vs page GENERAL: no distress NECK: There is no palpable thyroid enlargement.  No thyroid nodule is palpable.  No palpable lymphadenopathy at the anterior neck. LUNGS:  Clear to auscultation HEART:  Regular rate and rhythm without murmurs noted. Normal S1,S2.      Assessment & Plan:  Dyslipidemia:  He has fatigue, and wants cad to be excluded.  He declined another treadmill. Depression, ? ADD Osteoporosis: further dx/rx refused    Subjective:   Patient here for Medicare annual wellness visit and management of other chronic and acute problems.     Risk factors: advanced age    Roster of Physicians Providing Medical Care to Patient:  See "snapshot"  Activities of Daily Living: In your present state of health, do you have any difficulty performing the following activities?:  Preparing food and eating?: No  Bathing yourself: No  Getting dressed: No  Using the toilet: No  Moving around from place to place: No  In the past year have you fallen or had a near fall?: No    Home Safety: Has smoke detector and wears seat belts. No firearms. No excess sun exposure.  Diet and Exercise  Current exercise habits: pt says good Dietary issues discussed: pt reports a healthy diet   Depression Screen  Q1: Over the past two weeks, have you felt down, depressed or hopeless? He says depression is well-controlled.    Q2: Over the past two weeks, have you felt little interest or pleasure in doing things? no   The following portions of the patient's history were reviewed and updated as appropriate: allergies, current  medications, past family history, past medical history, past social history, past surgical history and problem list.  Past Medical History  Diagnosis Date  . Sleep apnea   . Depression   . Hyperlipidemia   . Anxiety state, unspecified   . GERD   . INSOMNIA   . HYPOTHYROIDISM, POST-RADIOACTIVE IODINE   . ORGANIC IMPOTENCE   . OSTEOPOROSIS     Past Surgical History  Procedure Laterality Date  . Throat surgery    . Nasal septum surgery    . Rotator cuff repair    . Hernia repair      Inguinal Herniorrhapy-multiple bilateral  . Rubber band ligature of hemorrhoids ([place])  2007  . Uvulopalatoplasty      History   Social History  . Marital Status: Married    Spouse Name: N/A    Number of Children: N/A  . Years of Education: N/A   Occupational History  . Middle School Teacher    Social History Main Topics  . Smoking status: Never Smoker   . Smokeless tobacco: Never Used  . Alcohol Use: Yes  . Drug Use: No  . Sexually Active: Not on file   Other Topics Concern  . Not on file   Social History Narrative  . No narrative on file    Current Outpatient Prescriptions on File Prior to Visit  Medication Sig Dispense Refill  . atorvastatin (LIPITOR) 40 MG tablet TAKE 1 TABLET EVERY DAY  90 tablet  3  . clonazePAM (KLONOPIN) 1 MG tablet Take 1 tablet (1 mg total) by mouth 3 (three) times daily as needed for anxiety.  90 tablet  0  . fluticasone (FLONASE) 50 MCG/ACT nasal spray Place 2 sprays into the nose daily.  16 g  2  . levothyroxine (SYNTHROID, LEVOTHROID) 112 MCG tablet Take 1 tablet (112 mcg total) by mouth daily.  90 tablet  0  . omeprazole (PRILOSEC) 40 MG capsule Take 1 capsule (40 mg total) by mouth daily.  90 capsule  3  . venlafaxine XR (EFFEXOR-XR) 75 MG 24 hr capsule Take 1 capsule (75 mg total) by mouth daily.  90 capsule  3  . albuterol (PROVENTIL HFA;VENTOLIN HFA) 108 (90 BASE) MCG/ACT inhaler Inhale 2 puffs into the lungs every 6 (six) hours as needed for  wheezing.  1 Inhaler  0  . loratadine (CLARITIN) 10 MG tablet Take 1 tablet (10 mg total) by mouth daily.  30 tablet  11   No current facility-administered medications on file prior to visit.   Allergies  Allergen Reactions  . Codeine  Family History  Problem Relation Age of Onset  . Cancer Mother     Pancreatic Cancer  . Heart disease Father 65    CABG   BP 128/76  Pulse 83  Ht 5\' 10"  (1.778 m)  Wt 191 lb (86.637 kg)  BMI 27.41 kg/m2  SpO2 98% Review of Systems  Denies visual loss.  No change in chronic hearing loss.   Objective:   Vision:  Sees opthalmologist Hearing: grossly normal (has hearing aids) Body mass index:  See vs page Msk: pt easily and quickly performs "get-up-and-go" from a sitting position Cognitive Impairment Assessment: cognition, memory and judgment appear normal.  remembers 3/3 at 5 minutes.  excellent recall.  can easily read and write a sentence.  alert and oriented x 3.  Assessment:   Medicare wellness utd on preventive parameters    Plan:   During the course of the visit the patient was educated and counseled about appropriate screening and preventive services including:        Fall prevention    Diabetes screening  Nutrition counseling   Vaccines / LABS Zostavax / Pnemonccoal Vaccine  today  PSA  Patient Instructions (the written plan) was given to the patient.   we discussed code status.  pt requests full code, but would not want to be started or maintained on artificial life-support measures if there was not a reasonable chance of recovery

## 2013-01-25 ENCOUNTER — Other Ambulatory Visit: Payer: Self-pay | Admitting: Endocrinology

## 2013-01-25 DIAGNOSIS — R972 Elevated prostate specific antigen [PSA]: Secondary | ICD-10-CM

## 2013-02-08 DIAGNOSIS — R972 Elevated prostate specific antigen [PSA]: Secondary | ICD-10-CM | POA: Diagnosis not present

## 2013-02-08 DIAGNOSIS — N529 Male erectile dysfunction, unspecified: Secondary | ICD-10-CM | POA: Diagnosis not present

## 2013-03-02 DIAGNOSIS — F339 Major depressive disorder, recurrent, unspecified: Secondary | ICD-10-CM | POA: Diagnosis not present

## 2013-03-25 ENCOUNTER — Ambulatory Visit: Payer: Medicare Other | Admitting: Cardiovascular Disease

## 2013-03-25 DIAGNOSIS — IMO0002 Reserved for concepts with insufficient information to code with codable children: Secondary | ICD-10-CM | POA: Diagnosis not present

## 2013-03-25 DIAGNOSIS — R972 Elevated prostate specific antigen [PSA]: Secondary | ICD-10-CM | POA: Diagnosis not present

## 2013-03-31 DIAGNOSIS — C61 Malignant neoplasm of prostate: Secondary | ICD-10-CM | POA: Diagnosis not present

## 2013-04-05 DIAGNOSIS — F339 Major depressive disorder, recurrent, unspecified: Secondary | ICD-10-CM | POA: Diagnosis not present

## 2013-04-06 ENCOUNTER — Other Ambulatory Visit: Payer: Self-pay | Admitting: Endocrinology

## 2013-04-10 DIAGNOSIS — Z23 Encounter for immunization: Secondary | ICD-10-CM | POA: Diagnosis not present

## 2013-04-16 ENCOUNTER — Encounter: Payer: Self-pay | Admitting: Radiation Oncology

## 2013-04-16 DIAGNOSIS — C61 Malignant neoplasm of prostate: Secondary | ICD-10-CM

## 2013-04-16 HISTORY — DX: Malignant neoplasm of prostate: C61

## 2013-04-16 NOTE — Progress Notes (Signed)
GU Location of Tumor / Histology: adenocarcinoma of the prostate  If Prostate Cancer, Gleason Score is (3 + 4=7) and PSA is (5.11)  Patient presented February 2012 elevated PSA.  Biopsies of prostate (if applicable) revealed:     Past/Anticipated interventions by urology, if any: refer to radiation oncology for seed implant  Past/Anticipated interventions by medical oncology, if any: None  Weight changes, if any: None noted  Bowel/Bladder complaints, if any: no dysuria, no hematuria, nocturia x2/night, erectile dysfunction, fatigue   Nausea/Vomiting, if any: None noted  Pain issues, if any:  None noted  SAFETY ISSUES:  Prior radiation? Radioactive iodine  Pacemaker/ICD? No  Possible current pregnancy? No  Is the patient on methotrexate? 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 Interest in seed implantation. Prostate volume 22.49 cc.

## 2013-04-18 ENCOUNTER — Ambulatory Visit
Admission: RE | Admit: 2013-04-18 | Discharge: 2013-04-18 | Disposition: A | Discharge status: Home / Self Care | Payer: Medicare Other | Source: Ambulatory Visit | Attending: Radiation Oncology | Admitting: Radiation Oncology

## 2013-04-18 ENCOUNTER — Encounter: Payer: Self-pay | Admitting: Radiation Oncology

## 2013-04-18 VITALS — BP 157/89 | HR 87 | Temp 98.6°F | Resp 16 | Ht 70.0 in | Wt 195.5 lb

## 2013-04-18 DIAGNOSIS — E785 Hyperlipidemia, unspecified: Secondary | ICD-10-CM | POA: Diagnosis not present

## 2013-04-18 DIAGNOSIS — E039 Hypothyroidism, unspecified: Secondary | ICD-10-CM | POA: Insufficient documentation

## 2013-04-18 DIAGNOSIS — Z79899 Other long term (current) drug therapy: Secondary | ICD-10-CM | POA: Diagnosis not present

## 2013-04-18 DIAGNOSIS — C61 Malignant neoplasm of prostate: Secondary | ICD-10-CM | POA: Diagnosis not present

## 2013-04-18 NOTE — Progress Notes (Signed)
See progress note under physician encounter. 

## 2013-04-18 NOTE — Progress Notes (Signed)
Reports a strong urine stream. Denies night sweats. Reports occasional incontinence during the night. Denies difficulty completely emptying his bladder. Reports on average he gets up once during the night to void. Reports arthritis in his right knee but, denies bone pain. Reports that he had an EKG done in June Reports that he is anxious to get this seed implant done, recover, and enjoy his thanksgiving holiday without this hanging over his head. Denies dysuria or hematuria. Reports erectile dysfunction. Denies painful bowel movements, blood in stool or constipation. 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

## 2013-04-18 NOTE — Progress Notes (Signed)
Radiation Oncology         [phone] ________________________________  Initial outpatient Consultation  Name: Bryan Tyler MRN: [id number]  Date: 04/18/2013  DOB: Sep 20, 1945  WG:NFAOZHY, Gregary Signs, MD  Lindaann Slough, MD   REFERRING PHYSICIAN: Lindaann Slough, MD  DIAGNOSIS: 67 y.o. gentleman with stage T1c adenocarcinoma of the prostate with a Gleason's score of 3+4 and a PSA of 5.11  HISTORY OF PRESENT ILLNESS::Bryan Tyler is a 67 y.o. gentleman followed by Dr. Brunilda Payor for urinary symptoms in the past.  He was noted to have an elevated PSA of 3.61 in 6/13 increasing to 4.39 in 6/14 by his primary care physician, Dr. Everardo All.  Accordingly, he was referred for evaluation in urology by Dr. Brunilda Payor on 02/08/13,  digital rectal examination was performed at that time revealing no nodules.  Repeat PSA in July was higher at 5.11. The patient proceeded to transrectal ultrasound with 12 biopsies of the prostate on 03/25/13.  The prostate volume measured 22.49 cc.  Out of 12 core biopsies, 2 were positive.  The maximum Gleason score was 3+4, and this was seen in 50% of the left lateral base and Gleason 3+3 was seen in less than 5% of the left lateral apex.  The patient reviewed the biopsy results with his urologist and he has kindly been referred today for discussion of potential radiation treatment options.  PREVIOUS RADIATION THERAPY: No  PAST MEDICAL HISTORY:  has a past medical history of Sleep apnea; Depression; Hyperlipidemia; Anxiety state, unspecified; GERD; INSOMNIA; ORGANIC IMPOTENCE; OSTEOPOROSIS; Prostate cancer; Arthritis; HYPOTHYROIDISM, POST-RADIOACTIVE IODINE; and Adenocarcinoma of prostate (04/16/2013).    PAST SURGICAL HISTORY: Past Surgical History  Procedure Laterality Date  . Throat surgery    . Nasal septum surgery    . Rotator cuff repair    . Hernia repair      Inguinal Herniorrhapy-multiple bilateral  . Rubber band ligature of hemorrhoids ([place])  2007  .  Uvulopalatoplasty    . Knee surgery    . Prostate biopsy      FAMILY HISTORY: family history includes Cancer in his mother; Heart disease (age of onset: 9) in his father.  SOCIAL HISTORY:  reports that he quit smoking about 24 years ago. His smoking use included Cigarettes. He has a 20 pack-year smoking history. He has never used smokeless tobacco. He reports that  drinks alcohol. He reports that he does not use illicit drugs.  ALLERGIES: Codeine  MEDICATIONS:  Current Outpatient Prescriptions  Medication Sig Dispense Refill  . aspirin 81 MG tablet Take 81 mg by mouth daily.      Marland Kitchen atorvastatin (LIPITOR) 40 MG tablet TAKE 1 TABLET EVERY DAY  90 tablet  3  . clonazePAM (KLONOPIN) 1 MG tablet Take 1 tablet (1 mg total) by mouth 3 (three) times daily as needed for anxiety.  270 tablet  1  . levothyroxine (SYNTHROID, LEVOTHROID) 112 MCG tablet Take 1 tablet (112 mcg total) by mouth daily.  90 tablet  0  . meloxicam (MOBIC) 15 MG tablet Take 15 mg by mouth daily.      Marland Kitchen omeprazole (PRILOSEC) 40 MG capsule Take 1 capsule (40 mg total) by mouth daily.  90 capsule  3  . venlafaxine XR (EFFEXOR-XR) 75 MG 24 hr capsule Take 1 capsule (75 mg total) by mouth daily.  90 capsule  3  . albuterol (PROVENTIL HFA;VENTOLIN HFA) 108 (90 BASE) MCG/ACT inhaler Inhale 2 puffs into the lungs every 6 (six) hours as needed for wheezing.  1 Inhaler  0  . glucosamine-chondroitin 500-400 MG tablet Take 1 tablet by mouth 3 (three) times daily.      Marland Kitchen loratadine (CLARITIN) 10 MG tablet Take 1 tablet (10 mg total) by mouth daily.  30 tablet  11   No current facility-administered medications for this encounter.    REVIEW OF SYSTEMS:  A 15 point review of systems is documented in the electronic medical record. This was obtained by the nursing staff. However, I reviewed this with the patient to discuss relevant findings and make appropriate changes.  A comprehensive review of systems was negative..  The patient completed  an IPSS and IIEF questionnaire.  His IPSS score was 8 indicating mild urinary outflow obstructive symptoms.    PHYSICAL EXAM: This patient is in no acute distress.  He is alert and oriented.   height is 5\' 10"  (1.778 m) and weight is 195 lb 8 oz (88.678 kg). His oral temperature is 98.6 F (37 C). His blood pressure is 157/89 and his pulse is 87. His respiration is 16 and oxygen saturation is 100%.  He exhibits no respiratory distress or labored breathing.  He appears neurologically intact.  His mood is pleasant.  His affect is appropriate.  Please note the digital rectal exam findings described above.  KPS = 100  100 - Normal; no complaints; no evidence of disease. 90   - Able to carry on normal activity; minor signs or symptoms of disease. 80   - Normal activity with effort; some signs or symptoms of disease. 41   - Cares for self; unable to carry on normal activity or to do active work. 60   - Requires occasional assistance, but is able to care for most of his personal needs. 50   - Requires considerable assistance and frequent medical care. 40   - Disabled; requires special care and assistance. 30   - Severely disabled; hospital admission is indicated although death not imminent. 20   - Very sick; hospital admission necessary; active supportive treatment necessary. 10   - Moribund; fatal processes progressing rapidly. 0     - Dead  Karnofsky DA, Abelmann WH, Craver LS and Burchenal [facility] [phone]) The use of the nitrogen mustards in the palliative treatment of carcinoma: with particular reference to bronchogenic carcinoma Cancer 1 [phone]   LABORATORY DATA:  Lab Results  Component Value Date   WBC 6.5 01/24/2013   HGB 15.1 01/24/2013   HCT 45.9 01/24/2013   MCV 93.8 01/24/2013   PLT 229.0 01/24/2013   Lab Results  Component Value Date   NA 138 01/24/2013   K 4.7 01/24/2013   CL 102 01/24/2013   CO2 31 01/24/2013   Lab Results  Component Value Date   ALT 18 01/24/2013   AST 22 01/24/2013    ALKPHOS 48 01/24/2013   BILITOT 1.1 01/24/2013     RADIOGRAPHY: No results found.    IMPRESSION: This gentleman is a 67 y.o. gentleman with stage T1c adenocarcinoma of the prostate with a Gleason's score of 3+4 and a PSA of 5.11.  His T-Stage, Gleason's Score, and PSA put him into the favorable risk group.  Accordingly he is eligible for a variety of potential treatment options including radical prostatectomy, intensity modulated radiotherapy, or prostate seed implant as monotherapy.  PLAN:Today I reviewed the findings and workup thus far.  We discussed the natural history of prostate cancer.  We reviewed the the implications of T-stage, Gleason's Score, and PSA on decision-making and outcomes  in prostate cancer.  We discussed radiation treatment in the management of prostate cancer with regard to the logistics and delivery of external beam radiation treatment as well as the logistics and delivery of prostate brachytherapy.  We compared and contrasted each of these approaches and also compared these against prostatectomy.  The patient expressed interest in prostate brachytherapy.  I filled out a patient counseling form for him with relevant treatment diagrams and we retained a copy for our records.   The patient would like to proceed with prostate brachytherapy.  I will share my findings with Dr. Brunilda Payor and move forward with scheduling the procedure in the near future.     I enjoyed meeting with him today, and will look forward to participating in the care of this very nice gentleman.   I spent 60 minutes face to face with the patient and more than 50% of that time was spent in counseling and/or coordination of care.   ------------------------------------------------  Artist Pais. Kathrynn Running, M.D.

## 2013-04-19 ENCOUNTER — Telehealth: Payer: Self-pay | Admitting: *Deleted

## 2013-04-19 NOTE — Addendum Note (Signed)
Encounter addended by: Agnes Lawrence, RN on: 04/19/2013 10:07 AM<BR>     Documentation filed: Notes Section

## 2013-04-19 NOTE — Progress Notes (Signed)
Complete PATIENT MEASURE OF DISTRESS worksheet with a score of 3 submitted to social work.  

## 2013-04-19 NOTE — Addendum Note (Signed)
Encounter addended by: Agnes Lawrence, RN on: 04/19/2013 10:50 AM<BR>     Documentation filed: Charges VN, Inpatient Patient Education, Inpatient Document Flowsheet

## 2013-04-19 NOTE — Telephone Encounter (Signed)
CALLED PATIENT TO INFORM OF PRE-SEED APPT. FOR  04-22-13 AT 3 PM, LVM FOR A RETURN CALL

## 2013-04-22 ENCOUNTER — Ambulatory Visit
Admission: RE | Admit: 2013-04-22 | Discharge: 2013-04-22 | Disposition: A | Discharge status: Home / Self Care | Payer: Medicare Other | Source: Ambulatory Visit | Attending: Radiation Oncology | Admitting: Radiation Oncology

## 2013-04-22 ENCOUNTER — Encounter: Payer: Self-pay | Admitting: Radiation Oncology

## 2013-04-22 DIAGNOSIS — C61 Malignant neoplasm of prostate: Secondary | ICD-10-CM

## 2013-04-22 NOTE — Progress Notes (Signed)
  Radiation Oncology         [phone]) [phone] ________________________________  Name: Carleton Vanvalkenburgh MRN: [id number]  Date: 04/22/2013  DOB: 04-Jul-1946  SIMULATION AND TREATMENT PLANNING NOTE PUBIC ARCH STUDY  NF:AOZHYQM, [name], MD  Lindaann Slough, MD  DIAGNOSIS: 67 y.o. gentleman with stage T1c adenocarcinoma of the prostate with a Gleason's score of 3+4 and a PSA of 5.11  COMPLEX SIMULATION:  The patient presented today for evaluation for possible prostate seed implant. He was brought to the radiation planning suite and placed supine on the CT couch. A 3-dimensional image study set was obtained in upload to the planning computer. There, on each axial slice, I contoured the prostate gland. Then, using three-dimensional radiation planning tools I reconstructed the prostate in view of the structures from the transperineal needle pathway to assess for possible pubic arch interference. In doing so, I did not appreciate any pubic arch interference. Also, the patient's prostate volume was estimated based on the drawn structure. The volume was 28.6 cc.  Given the pubic arch appearance and prostate volume, patient remains a good candidate to proceed with prostate seed implant. Today, he freely provided informed written consent to proceed.    PLAN: The patient will undergo prostate seed implant.   ________________________________  Artist Pais. Kathrynn Running, M.D.

## 2013-04-25 ENCOUNTER — Ambulatory Visit: Payer: Medicare Other | Admitting: Cardiovascular Disease

## 2013-04-26 ENCOUNTER — Other Ambulatory Visit: Payer: Self-pay | Admitting: Urology

## 2013-04-27 ENCOUNTER — Other Ambulatory Visit: Payer: Self-pay | Admitting: Endocrinology

## 2013-05-03 ENCOUNTER — Encounter (HOSPITAL_BASED_OUTPATIENT_CLINIC_OR_DEPARTMENT_OTHER)
Admission: RE | Admit: 2013-05-03 | Discharge: 2013-05-03 | Disposition: A | Discharge status: Home / Self Care | Payer: Medicare Other | Source: Ambulatory Visit | Attending: Urology | Admitting: Urology

## 2013-05-03 ENCOUNTER — Other Ambulatory Visit: Payer: Self-pay

## 2013-05-03 ENCOUNTER — Ambulatory Visit (HOSPITAL_BASED_OUTPATIENT_CLINIC_OR_DEPARTMENT_OTHER)
Admission: RE | Admit: 2013-05-03 | Discharge: 2013-05-03 | Disposition: A | Discharge status: Home / Self Care | Payer: Medicare Other | Source: Ambulatory Visit | Attending: Urology | Admitting: Urology

## 2013-05-03 DIAGNOSIS — C61 Malignant neoplasm of prostate: Secondary | ICD-10-CM | POA: Diagnosis not present

## 2013-05-03 DIAGNOSIS — F339 Major depressive disorder, recurrent, unspecified: Secondary | ICD-10-CM | POA: Diagnosis not present

## 2013-05-03 DIAGNOSIS — Z01810 Encounter for preprocedural cardiovascular examination: Secondary | ICD-10-CM | POA: Diagnosis not present

## 2013-05-03 DIAGNOSIS — Z01818 Encounter for other preprocedural examination: Secondary | ICD-10-CM | POA: Diagnosis not present

## 2013-05-03 DIAGNOSIS — J40 Bronchitis, not specified as acute or chronic: Secondary | ICD-10-CM | POA: Diagnosis not present

## 2013-05-19 DIAGNOSIS — IMO0002 Reserved for concepts with insufficient information to code with codable children: Secondary | ICD-10-CM | POA: Diagnosis not present

## 2013-05-19 DIAGNOSIS — M1710 Unilateral primary osteoarthritis, unspecified knee: Secondary | ICD-10-CM | POA: Diagnosis not present

## 2013-05-20 ENCOUNTER — Telehealth: Payer: Self-pay | Admitting: *Deleted

## 2013-05-20 NOTE — Telephone Encounter (Signed)
Called patient to remind of lab for 05-23-13, lvm for a return call

## 2013-05-23 DIAGNOSIS — C61 Malignant neoplasm of prostate: Secondary | ICD-10-CM | POA: Diagnosis not present

## 2013-05-24 ENCOUNTER — Encounter (HOSPITAL_BASED_OUTPATIENT_CLINIC_OR_DEPARTMENT_OTHER): Payer: Self-pay | Admitting: *Deleted

## 2013-05-24 DIAGNOSIS — E890 Postprocedural hypothyroidism: Secondary | ICD-10-CM | POA: Diagnosis not present

## 2013-05-24 DIAGNOSIS — E785 Hyperlipidemia, unspecified: Secondary | ICD-10-CM | POA: Diagnosis not present

## 2013-05-24 DIAGNOSIS — C61 Malignant neoplasm of prostate: Secondary | ICD-10-CM | POA: Diagnosis not present

## 2013-05-24 DIAGNOSIS — Z87891 Personal history of nicotine dependence: Secondary | ICD-10-CM | POA: Diagnosis not present

## 2013-05-24 DIAGNOSIS — K219 Gastro-esophageal reflux disease without esophagitis: Secondary | ICD-10-CM | POA: Diagnosis not present

## 2013-05-24 LAB — CBC
MCH: 30.3 pg (ref 26.0–34.0)
MCHC: 33.1 g/dL (ref 30.0–36.0)
RDW: 12.7 % (ref 11.5–15.5)

## 2013-05-24 LAB — PROTIME-INR: Prothrombin Time: 12.3 seconds (ref 11.6–15.2)

## 2013-05-24 LAB — APTT: aPTT: 26 seconds (ref 24–37)

## 2013-05-24 NOTE — Progress Notes (Addendum)
SPOKE W/ PT'S WIFE, Bryan Tyler. NPO AFTER MN WITH EXCEPTION CLEAR LIQUIDS UNTIL 0800 (NO CREAM/ MILK PRODUCTS). ARRIVE AT 1245. CURRENT LAB RESULTS, CXR AND EKG IN EPIC AND CHART.  WILL DO FLEET ENEMA AM DOS.  PT TO CALL BACK W/ MED. LIST AND VERIFY SURGERY'S.  PT CALLED BACK W/ MED LIST AND WE VERIFIED SURGERY'S.  PT WILL TAKE EFFEXOR, PRILOSEC AND SYNTHROID AM DOS W/ SIPS OF WATER.

## 2013-05-25 ENCOUNTER — Encounter (HOSPITAL_BASED_OUTPATIENT_CLINIC_OR_DEPARTMENT_OTHER): Payer: Self-pay | Admitting: *Deleted

## 2013-05-27 ENCOUNTER — Telehealth: Payer: Self-pay | Admitting: *Deleted

## 2013-05-27 NOTE — Telephone Encounter (Signed)
Called patient to remind of procedure for 05-30-13, lvm for a return call

## 2013-05-30 ENCOUNTER — Encounter (HOSPITAL_BASED_OUTPATIENT_CLINIC_OR_DEPARTMENT_OTHER): Payer: Self-pay | Admitting: *Deleted

## 2013-05-30 ENCOUNTER — Encounter (HOSPITAL_BASED_OUTPATIENT_CLINIC_OR_DEPARTMENT_OTHER): Payer: Medicare Other | Admitting: Anesthesiology

## 2013-05-30 ENCOUNTER — Encounter (HOSPITAL_BASED_OUTPATIENT_CLINIC_OR_DEPARTMENT_OTHER)
Admission: RE | Disposition: A | Discharge status: Home / Self Care | Payer: Self-pay | Source: Ambulatory Visit | Attending: Urology

## 2013-05-30 ENCOUNTER — Ambulatory Visit (HOSPITAL_BASED_OUTPATIENT_CLINIC_OR_DEPARTMENT_OTHER)
Admission: RE | Admit: 2013-05-30 | Discharge: 2013-05-30 | Disposition: A | Discharge status: Home / Self Care | Payer: Medicare Other | Source: Ambulatory Visit | Attending: Urology | Admitting: Urology

## 2013-05-30 ENCOUNTER — Ambulatory Visit (HOSPITAL_COMMUNITY): Payer: Medicare Other

## 2013-05-30 ENCOUNTER — Ambulatory Visit (HOSPITAL_BASED_OUTPATIENT_CLINIC_OR_DEPARTMENT_OTHER): Payer: Medicare Other | Admitting: Anesthesiology

## 2013-05-30 DIAGNOSIS — C61 Malignant neoplasm of prostate: Secondary | ICD-10-CM | POA: Insufficient documentation

## 2013-05-30 DIAGNOSIS — K219 Gastro-esophageal reflux disease without esophagitis: Secondary | ICD-10-CM | POA: Diagnosis not present

## 2013-05-30 DIAGNOSIS — E785 Hyperlipidemia, unspecified: Secondary | ICD-10-CM | POA: Diagnosis not present

## 2013-05-30 DIAGNOSIS — Z87891 Personal history of nicotine dependence: Secondary | ICD-10-CM | POA: Diagnosis not present

## 2013-05-30 DIAGNOSIS — E890 Postprocedural hypothyroidism: Secondary | ICD-10-CM | POA: Diagnosis not present

## 2013-05-30 DIAGNOSIS — G4710 Hypersomnia, unspecified: Secondary | ICD-10-CM | POA: Diagnosis not present

## 2013-05-30 HISTORY — DX: Obstructive sleep apnea (adult) (pediatric): G47.33

## 2013-05-30 HISTORY — DX: Anxiety disorder, unspecified: F41.9

## 2013-05-30 HISTORY — DX: Presence of spectacles and contact lenses: Z97.3

## 2013-05-30 HISTORY — DX: Nocturia: R35.1

## 2013-05-30 HISTORY — PX: RADIOACTIVE SEED IMPLANT: SHX5150

## 2013-05-30 HISTORY — DX: Urge incontinence: N39.41

## 2013-05-30 HISTORY — DX: Age-related osteoporosis without current pathological fracture: M81.0

## 2013-05-30 HISTORY — DX: Postprocedural hypothyroidism: E89.0

## 2013-05-30 HISTORY — DX: Gastro-esophageal reflux disease without esophagitis: K21.9

## 2013-05-30 HISTORY — DX: Male erectile dysfunction, unspecified: N52.9

## 2013-05-30 SURGERY — INSERTION, RADIATION SOURCE, PROSTATE
Anesthesia: General | Site: Prostate | Wound class: Clean Contaminated

## 2013-05-30 MED ORDER — LACTATED RINGERS IV SOLN
INTRAVENOUS | Status: DC
  Filled 2013-05-30: qty 1000

## 2013-05-30 MED ORDER — HYDROCODONE-ACETAMINOPHEN 5-325 MG PO TABS
1.0000 | ORAL_TABLET | Freq: Four times a day (QID) | ORAL | Status: DC | PRN
Start: 2013-05-30 — End: 2013-12-19

## 2013-05-30 MED ORDER — PROPOFOL 10 MG/ML IV BOLUS
INTRAVENOUS | Status: DC | PRN
  Administered 2013-05-30: 250 mg via INTRAVENOUS
  Administered 2013-05-30: 50 mg via INTRAVENOUS

## 2013-05-30 MED ORDER — FENTANYL CITRATE 0.05 MG/ML IJ SOLN
INTRAMUSCULAR | Status: DC | PRN
  Administered 2013-05-30 (×4): 50 ug via INTRAVENOUS

## 2013-05-30 MED ORDER — FLEET ENEMA 7-19 GM/118ML RE ENEM
1.0000 | ENEMA | Freq: Once | RECTAL | Status: DC
  Filled 2013-05-30: qty 1

## 2013-05-30 MED ORDER — MIDAZOLAM HCL 5 MG/5ML IJ SOLN
INTRAMUSCULAR | Status: DC | PRN
  Administered 2013-05-30: 2 mg via INTRAVENOUS

## 2013-05-30 MED ORDER — IOHEXOL 350 MG/ML SOLN
INTRAVENOUS | Status: DC | PRN
  Administered 2013-05-30: 7 mL

## 2013-05-30 MED ORDER — LIDOCAINE HCL (CARDIAC) 20 MG/ML IV SOLN
INTRAVENOUS | Status: DC | PRN
  Administered 2013-05-30: 80 mg via INTRAVENOUS

## 2013-05-30 MED ORDER — LACTATED RINGERS IV SOLN
INTRAVENOUS | Status: DC
  Administered 2013-05-30 (×2): via INTRAVENOUS
  Filled 2013-05-30: qty 1000

## 2013-05-30 MED ORDER — STERILE WATER FOR IRRIGATION IR SOLN
Status: DC | PRN
  Administered 2013-05-30: 13 mL

## 2013-05-30 MED ORDER — ONDANSETRON HCL 4 MG/2ML IJ SOLN
INTRAMUSCULAR | Status: DC | PRN
  Administered 2013-05-30: 4 mg via INTRAVENOUS

## 2013-05-30 MED ORDER — DEXAMETHASONE SODIUM PHOSPHATE 4 MG/ML IJ SOLN
INTRAMUSCULAR | Status: DC | PRN
  Administered 2013-05-30: 10 mg via INTRAVENOUS

## 2013-05-30 MED ORDER — HYDROMORPHONE HCL PF 1 MG/ML IJ SOLN
0.2500 mg | INTRAMUSCULAR | Status: DC | PRN
  Filled 2013-05-30: qty 1

## 2013-05-30 MED ORDER — ACETAMINOPHEN 10 MG/ML IV SOLN
INTRAVENOUS | Status: DC | PRN
  Administered 2013-05-30: 1000 mg via INTRAVENOUS

## 2013-05-30 MED ORDER — CIPROFLOXACIN HCL 500 MG PO TABS: 500.0000 mg | ORAL_TABLET | Freq: Two times a day (BID) | ORAL | Status: DC

## 2013-05-30 MED ORDER — EPHEDRINE SULFATE 50 MG/ML IJ SOLN
INTRAMUSCULAR | Status: DC | PRN
  Administered 2013-05-30 (×3): 10 mg via INTRAVENOUS
  Administered 2013-05-30: 5 mg via INTRAVENOUS

## 2013-05-30 MED ORDER — PROMETHAZINE HCL 25 MG/ML IJ SOLN
6.2500 mg | INTRAMUSCULAR | Status: DC | PRN
  Filled 2013-05-30: qty 1

## 2013-05-30 MED ORDER — SCOPOLAMINE 1 MG/3DAYS TD PT72
MEDICATED_PATCH | TRANSDERMAL | Status: DC | PRN
  Administered 2013-05-30: 1 via TRANSDERMAL

## 2013-05-30 MED ORDER — CIPROFLOXACIN IN D5W 400 MG/200ML IV SOLN
400.0000 mg | INTRAVENOUS | Status: AC
  Administered 2013-05-30: 400 mg via INTRAVENOUS
  Filled 2013-05-30: qty 200

## 2013-05-30 MED ORDER — STERILE WATER FOR IRRIGATION IR SOLN
Status: DC | PRN
  Administered 2013-05-30: 3000 mL

## 2013-05-30 SURGICAL SUPPLY — 26 items
BAG URINE DRAINAGE (UROLOGICAL SUPPLIES) ×2 IMPLANT
BLADE SURG ROTATE 9660 (MISCELLANEOUS) ×2 IMPLANT
CATH FOLEY 2WAY SLVR  5CC 16FR (CATHETERS) ×2
CATH FOLEY 2WAY SLVR 5CC 16FR (CATHETERS) ×2 IMPLANT
CATH ROBINSON RED A/P 20FR (CATHETERS) ×2 IMPLANT
CLOTH BEACON ORANGE TIMEOUT ST (SAFETY) ×2 IMPLANT
COVER MAYO STAND STRL (DRAPES) ×2 IMPLANT
COVER TABLE BACK 60X90 (DRAPES) ×2 IMPLANT
DRSG TEGADERM 4X4.75 (GAUZE/BANDAGES/DRESSINGS) ×2 IMPLANT
DRSG TEGADERM 8X12 (GAUZE/BANDAGES/DRESSINGS) ×4 IMPLANT
GLOVE BIO SURGEON STRL SZ7 (GLOVE) ×4 IMPLANT
GLOVE BIO SURGEON STRL SZ7.5 (GLOVE) IMPLANT
GLOVE BIOGEL M 6.5 STRL (GLOVE) ×4 IMPLANT
GLOVE BIOGEL M STER SZ 6 (GLOVE) ×2 IMPLANT
GLOVE BIOGEL PI IND STRL 6.5 (GLOVE) ×1 IMPLANT
GLOVE BIOGEL PI INDICATOR 6.5 (GLOVE) ×1
GLOVE ECLIPSE 8.0 STRL XLNG CF (GLOVE) IMPLANT
HOLDER FOLEY CATH W/STRAP (MISCELLANEOUS) ×2 IMPLANT
IV WATER IRRIG STERILE 3000ML (IV SOLUTION) ×2 IMPLANT
PACK CYSTOSCOPY (CUSTOM PROCEDURE TRAY) ×2 IMPLANT
PAD PREP 24X48 CUFFED NSTRL (MISCELLANEOUS) ×2 IMPLANT
Prostate seeds (Urological Implant) ×158 IMPLANT
SPONGE GAUZE 4X4 12PLY (GAUZE/BANDAGES/DRESSINGS) ×2 IMPLANT
SYRINGE 10CC LL (SYRINGE) ×2 IMPLANT
UNDERPAD 30X30 INCONTINENT (UNDERPADS AND DIAPERS) ×4 IMPLANT
WATER STERILE IRR 500ML POUR (IV SOLUTION) ×2 IMPLANT

## 2013-05-30 NOTE — H&P (Signed)
  History and Physical  Chief Complaint:  Adenocarcinoma of prostate  History of Present Illness: Bryan Tyler has prostate cancer Gleason 3+4.  PSA at diagnosis was 5.11.  Treatment options were discussed with him.  He elected to have radiation therapy.  He is scheduled today for seeds implantation.  Past Medical History  Diagnosis Date  . Hyperlipidemia   . Arthritis   . Adenocarcinoma of prostate 04/16/2013  . GERD (gastroesophageal reflux disease)   . Hypothyroidism, postradioiodine therapy     1980's  . Anxiety   . Osteoporosis   . Depression   . Organic impotence   . History of hyperthyroidism   . Nocturia   . Urge urinary incontinence   . OSA (obstructive sleep apnea)     NO CPAP--  S/P SURGERY  2002  . Wears contact lenses    Past Surgical History  Procedure Laterality Date  . Rotator cuff repair Right 2004  . Prostate biopsy    . Hemorrhoidectomy with hemorrhoid banding  2007  . Uvulopalatopharyngoplasty  2002    w/ septoplasty and rhinoplasty  . Tonsillectomy  AS CHILD  . Knee arthroscopy Right 2012  . Inguinal hernia repair Bilateral     x4  (2 each side)    Medications: Clonazepam,, levothyroxine, omeprazole, Viagra Allergies:  Allergies  Allergen Reactions  . Codeine Nausea And Vomiting and Other (See Comments)    DIZZINESS    Family History  Problem Relation Age of Onset  . Cancer Mother     Pancreatic Cancer  . Heart disease Father 36    CABG   Social History:  reports that he quit smoking about 24 years ago. His smoking use included Cigarettes. He has a 20 pack-year smoking history. He has never used smokeless tobacco. He reports that he drinks alcohol. He reports that he does not use illicit drugs.  ROS: All systems are reviewed and negative except as noted.   Physical Exam:  Vital signs in last 24 hours: Temp:  [97.1 F (36.2 C)] 97.1 F (36.2 C) (10/20 1252) Pulse Rate:  [75] 75 (10/20 1252) Resp:  [16] 16 (10/20 1252) BP: (139)/(88)  139/88 mmHg (10/20 1252) SpO2:  [96 %] 96 % (10/20 1252) Weight:  [85.503 kg (188 lb 8 oz)] 85.503 kg (188 lb 8 oz) (10/20 1252) HEENT: Normal.  No thyromegaly; no adenopathy. Cardiovascular: Skin warm; not flushed Respiratory: Breaths quiet; no shortness of breath Abdomen: No masses Neurological: Normal sensation to touch Musculoskeletal: Normal motor function arms and legs Lymphatics: No inguinal adenopathy Skin: No rashes Genitourinary:Penis and scrotal contents are within normal limits  Laboratory Data:  No results found for this or any previous visit (from the past 24 hour(s)). No results found for this or any previous visit (from the past 240 hour(s)). Creatinine: No results found for this basename: CREATININE,  in the last 168 hours    Impression/Assessment:  Stage T1C adenocarcinoma of prostate  Plan:  I 125 seeds implantation  Bryan Tyler 05/30/2013, 1:59 PM

## 2013-05-30 NOTE — Transfer of Care (Signed)
Immediate Anesthesia Transfer of Care Note  Patient: Bryan Tyler  Procedure(s) Performed: Procedure(s): RADIOACTIVE SEED IMPLANT (N/A)  Patient Location: PACU  Anesthesia Type:General  Level of Consciousness: awake and sedated  Airway & Oxygen Therapy: Patient Spontanous Breathing and Patient connected to nasal cannula oxygen  Post-op Assessment: Report given to PACU RN  Post vital signs: Reviewed and stable  Complications: No apparent anesthesia complications

## 2013-05-30 NOTE — Anesthesia Procedure Notes (Signed)
Procedure Name: LMA Insertion Date/Time: 05/30/2013 2:23 PM Performed by: Maris Berger T Pre-anesthesia Checklist: Patient identified, Emergency Drugs available, Suction available and Patient being monitored Patient Re-evaluated:Patient Re-evaluated prior to inductionOxygen Delivery Method: Circle System Utilized Preoxygenation: Pre-oxygenation with 100% oxygen Intubation Type: IV induction Ventilation: Mask ventilation without difficulty LMA: LMA inserted LMA Size: 5.0 Number of attempts: 1 Airway Equipment and Method: bite block Placement Confirmation: positive ETCO2 Dental Injury: Teeth and Oropharynx as per pre-operative assessment

## 2013-05-30 NOTE — Op Note (Addendum)
KASH DAVIE is a 67 y.o.   05/30/2013  General  Preop diagnosis: Stage T1 C. adenocarcinoma of prostate  Postop diagnosis: Same  Procedure done: I-125 seeds implantation, cystoscopy  Surgeon: Wendie Simmer. Tekeshia Klahr and Dr. Margaretmary Dys  Anesthesia: General  Indication: Patient is a 67 years old male who has prostate cancer Gleason 3+4. PSA at diagnosis was 5.11. Treatment options were discussed with the patient. He elected to have radiation therapy. He saw Dr. Kathrynn Running in consultation and is scheduled today for seeds implantation  Procedure: Patient was identified by his wrist band and proper timeout was taken.  Under general anesthesia he was prepped and draped and placed in the dorsolithotomy position. A #16 French Foley catheter was inserted in the bladder. Ultrasound planning was then done by Dr. Kathrynn Running. When planning was completed with the Nucletron and under ultrasound guidance a total of 79 seeds were implanted in the prostate through 25 needles. The total apparent activity is 32.1530 mCi. Under fluoroscopy there appears to be good seeds distribution. The Foley catheter was then removed.  The flexible cystoscope was inserted in the bladder. The anterior urethra is normal. There is moderate prostatic hypertrophy. The bladder mucosa is normal. There is no stone, seed or tumor in the bladder. The ureteral orifices are in normal position and shape with clear efflux. The cystoscope was then removed. A #16 French Foley catheter was then reinserted in the bladder.  EBL: Minimal.  The patient tolerated the procedure well and left the OR in satisfactory condition to postanesthesia care unit.

## 2013-05-30 NOTE — Anesthesia Preprocedure Evaluation (Addendum)
Anesthesia Evaluation  Patient identified by MRN, date of birth, ID band Patient awake    Reviewed: Allergy & Precautions, H&P , NPO status , Patient's Chart, lab work & pertinent test results  History of Anesthesia Complications (+) PONV  Airway Mallampati: II TM Distance: >3 FB Neck ROM: Full    Dental  (+) Teeth Intact, Caps and Dental Advisory Given   Pulmonary sleep apnea , former smoker,  breath sounds clear to auscultation  Pulmonary exam normal       Cardiovascular negative cardio ROS  Rhythm:Regular Rate:Normal     Neuro/Psych Anxiety Depression negative neurological ROS     GI/Hepatic Neg liver ROS, GERD-  ,  Endo/Other  negative endocrine ROSHypothyroidism   Renal/GU Renal disease  negative genitourinary   Musculoskeletal negative musculoskeletal ROS (+)   Abdominal   Peds  Hematology negative hematology ROS (+)   Anesthesia Other Findings Caps lower anterior incisors  Reproductive/Obstetrics                          Anesthesia Physical Anesthesia Plan  ASA: II  Anesthesia Plan: General   Post-op Pain Management:    Induction: Intravenous  Airway Management Planned: LMA  Additional Equipment:   Intra-op Plan:   Post-operative Plan: Extubation in OR  Informed Consent: I have reviewed the patients History and Physical, chart, labs and discussed the procedure including the risks, benefits and alternatives for the proposed anesthesia with the patient or authorized representative who has indicated his/her understanding and acceptance.   Dental advisory given  Plan Discussed with: CRNA  Anesthesia Plan Comments: (N/V prophylaxis intraop.)        Anesthesia Quick Evaluation

## 2013-05-30 NOTE — Anesthesia Postprocedure Evaluation (Signed)
Anesthesia Post Note  Patient: Bryan Tyler  Procedure(s) Performed: Procedure(s) (LRB): RADIOACTIVE SEED IMPLANT (N/A)  Anesthesia type: General  Patient location: PACU  Post pain: Pain level controlled  Post assessment: Post-op Vital signs reviewed  Last Vitals: BP 126/61  Pulse 87  Temp(Src) 36.2 C (Oral)  Resp 24  Ht 5\' 10"  (1.778 m)  Wt 188 lb 8 oz (85.503 kg)  BMI 27.05 kg/m2  SpO2 100%  Post vital signs: Reviewed  Level of consciousness: sedated  Complications: No apparent anesthesia complications

## 2013-05-31 ENCOUNTER — Encounter (HOSPITAL_BASED_OUTPATIENT_CLINIC_OR_DEPARTMENT_OTHER): Payer: Self-pay | Admitting: Urology

## 2013-05-31 NOTE — Procedures (Signed)
  Radiation Oncology         [phone] ________________________________  Name: [name] MRN: [id number]  Date: 05/31/2013  DOB: 1946-05-17       Prostate Seed Implant  WU:[id number], [name], MD  No ref. provider found  DIAGNOSIS: 67 y.o. gentleman with stage T1c adenocarcinoma of the prostate with a Gleason's score of 3+4 and a PSA of 5.11  PROCEDURE: Insertion of radioactive I-125 seeds into the prostate gland.  RADIATION DOSE: 145 Gy, definitive therapy.  TECHNIQUE: Bryan Tyler was brought to the operating room with the urologist. He was placed in the dorsolithotomy position. He was catheterized and a rectal tube was inserted. The perineum was shaved, prepped and draped. The ultrasound probe was then introduced into the rectum to see the prostate gland.  TREATMENT DEVICE: A needle grid was attached to the ultrasound probe stand and anchor needles were placed.  3D PLANNING: The prostate was imaged in 3D using a sagittal sweep of the prostate probe. These images were transferred to the planning computer. There, the prostate, urethra and rectum were defined on each axial reconstructed image. Then, the software created an optimized plan and a few seed positions were adjusted. The quality of the plan was evaluated in terms of dose volume histograms and isodose reconstructions.  Then the accepted plan was uploaded to the seed Selectron afterloading unit.  SPECIAL TREATMENT PROCEDURE/SUPERVISION AND HANDLING: The Nucletron FIRST system was used to place the needles under sagittal guidance. A total of 25 needles were used to deposit 79 seeds in the prostate gland. The individual seed activity was 0.407 mCi for a total implant activity of 32.153 mCi.  COMPLEX SIMULATION: At the end of the procedure, an anterior radiograph of the pelvis was obtained to document seed positioning and count. Cystoscopy was performed to check the urethra and bladder.  MICRODOSIMETRY: At the end of the  procedure, the patient was emitting 0.0 mrem/hr at 1 meter. Accordingly, he was considered safe for hospital discharge.  PLAN: The patient will return to the radiation oncology clinic for post implant CT dosimetry in three weeks.  ________________________________  Artist Pais Kathrynn Running, M.D.

## 2013-06-11 ENCOUNTER — Emergency Department (HOSPITAL_COMMUNITY): Payer: Medicare Other

## 2013-06-11 ENCOUNTER — Encounter (HOSPITAL_COMMUNITY): Payer: Self-pay | Admitting: Emergency Medicine

## 2013-06-11 ENCOUNTER — Emergency Department (HOSPITAL_COMMUNITY)
Admission: EM | Admit: 2013-06-11 | Discharge: 2013-06-11 | Disposition: A | Discharge status: Home / Self Care | Payer: Medicare Other | Attending: Emergency Medicine | Admitting: Emergency Medicine

## 2013-06-11 DIAGNOSIS — F329 Major depressive disorder, single episode, unspecified: Secondary | ICD-10-CM | POA: Diagnosis not present

## 2013-06-11 DIAGNOSIS — Z791 Long term (current) use of non-steroidal anti-inflammatories (NSAID): Secondary | ICD-10-CM | POA: Insufficient documentation

## 2013-06-11 DIAGNOSIS — Z87891 Personal history of nicotine dependence: Secondary | ICD-10-CM | POA: Insufficient documentation

## 2013-06-11 DIAGNOSIS — Z8669 Personal history of other diseases of the nervous system and sense organs: Secondary | ICD-10-CM | POA: Diagnosis not present

## 2013-06-11 DIAGNOSIS — E785 Hyperlipidemia, unspecified: Secondary | ICD-10-CM | POA: Insufficient documentation

## 2013-06-11 DIAGNOSIS — K219 Gastro-esophageal reflux disease without esophagitis: Secondary | ICD-10-CM | POA: Insufficient documentation

## 2013-06-11 DIAGNOSIS — Z79899 Other long term (current) drug therapy: Secondary | ICD-10-CM | POA: Diagnosis not present

## 2013-06-11 DIAGNOSIS — R5383 Other fatigue: Secondary | ICD-10-CM | POA: Insufficient documentation

## 2013-06-11 DIAGNOSIS — F3289 Other specified depressive episodes: Secondary | ICD-10-CM | POA: Diagnosis not present

## 2013-06-11 DIAGNOSIS — Z792 Long term (current) use of antibiotics: Secondary | ICD-10-CM | POA: Diagnosis not present

## 2013-06-11 DIAGNOSIS — M129 Arthropathy, unspecified: Secondary | ICD-10-CM | POA: Diagnosis not present

## 2013-06-11 DIAGNOSIS — H547 Unspecified visual loss: Secondary | ICD-10-CM | POA: Diagnosis not present

## 2013-06-11 DIAGNOSIS — S329XXA Fracture of unspecified parts of lumbosacral spine and pelvis, initial encounter for closed fracture: Secondary | ICD-10-CM | POA: Diagnosis not present

## 2013-06-11 DIAGNOSIS — Z8546 Personal history of malignant neoplasm of prostate: Secondary | ICD-10-CM | POA: Insufficient documentation

## 2013-06-11 DIAGNOSIS — S32509A Unspecified fracture of unspecified pubis, initial encounter for closed fracture: Secondary | ICD-10-CM | POA: Diagnosis not present

## 2013-06-11 DIAGNOSIS — Z9089 Acquired absence of other organs: Secondary | ICD-10-CM | POA: Insufficient documentation

## 2013-06-11 DIAGNOSIS — Z7982 Long term (current) use of aspirin: Secondary | ICD-10-CM | POA: Diagnosis not present

## 2013-06-11 DIAGNOSIS — W11XXXA Fall on and from ladder, initial encounter: Secondary | ICD-10-CM | POA: Insufficient documentation

## 2013-06-11 DIAGNOSIS — E890 Postprocedural hypothyroidism: Secondary | ICD-10-CM | POA: Diagnosis not present

## 2013-06-11 DIAGNOSIS — IMO0002 Reserved for concepts with insufficient information to code with codable children: Secondary | ICD-10-CM | POA: Diagnosis not present

## 2013-06-11 DIAGNOSIS — F411 Generalized anxiety disorder: Secondary | ICD-10-CM | POA: Insufficient documentation

## 2013-06-11 DIAGNOSIS — R5381 Other malaise: Secondary | ICD-10-CM | POA: Insufficient documentation

## 2013-06-11 DIAGNOSIS — Y9389 Activity, other specified: Secondary | ICD-10-CM | POA: Insufficient documentation

## 2013-06-11 DIAGNOSIS — Y9289 Other specified places as the place of occurrence of the external cause: Secondary | ICD-10-CM | POA: Insufficient documentation

## 2013-06-11 MED ORDER — OXYCODONE-ACETAMINOPHEN 5-325 MG PO TABS: 1.0000 | ORAL_TABLET | Freq: Four times a day (QID) | ORAL | Status: DC | PRN

## 2013-06-11 MED ORDER — OXYCODONE-ACETAMINOPHEN 5-325 MG PO TABS
2.0000 | ORAL_TABLET | Freq: Once | ORAL | Status: AC
  Administered 2013-06-11: 2 via ORAL
  Filled 2013-06-11: qty 2

## 2013-06-11 NOTE — ED Notes (Signed)
L elbow abrasion, cleansed with normal saline, bacitracin applied, and covered with sterile guaze.

## 2013-06-11 NOTE — ED Notes (Signed)
Patient states while sitting he is pain free. C/o pain 9/10 to Left leg/hip when standing.

## 2013-06-11 NOTE — ED Provider Notes (Signed)
CSN: [id number]     Arrival date & time 06/11/13  2123 History  This chart was scribed for non-physician practitioner Antony Madura, PA-C, working with Raeford Razor, MD by Dorothey Baseman, ED Scribe. This patient was seen in room WTR8/WTR8 and the patient's care was started at 10:10 PM.    Chief Complaint  Patient presents with  . Fall   Patient is a 67 y.o. male presenting with fall. The history is provided by the patient. No language interpreter was used.  Fall This is a new problem. The current episode started 1 to 2 hours ago. The problem occurs constantly. The problem has not changed since onset.The symptoms are aggravated by walking and standing. The symptoms are relieved by rest. He has tried nothing for the symptoms.   HPI Comments: Bryan Tyler is a 67 y.o. male who presents to the Emergency Department complaining of a fall from the 3rd step of a ladder onto a tile floor and landed on his left side that occurred around 2 hours ago. He denies hitting his head or loss of consciousness. Patient reports an associated abrasion to the left elbow and a constant pain to the left hip that begins in the left side of the groin. Patient reports that he has been ambulatory since the incident with the use of crutches, but that the pain is exacerbated with bearing weight with some associated weakness. He states that the pain is relieved with sitting down and rest. He denies numbness and paresthesias. Patient reports a history of prostate seed implant that he is still currently recovering from and denies any recent changes. He reports a history of hernia repairs. He denies history of hip fracture. Patient reports a history of arthritis.   Orthopaedist- Dr. Thomasena Edis  Past Medical History  Diagnosis Date  . Hyperlipidemia   . Arthritis   . Adenocarcinoma of prostate 04/16/2013  . GERD (gastroesophageal reflux disease)   . Hypothyroidism, postradioiodine therapy     1980's  . Anxiety   . Osteoporosis   .  Depression   . Organic impotence   . History of hyperthyroidism   . Nocturia   . Urge urinary incontinence   . OSA (obstructive sleep apnea)     NO CPAP--  S/P SURGERY  2002  . Wears contact lenses    Past Surgical History  Procedure Laterality Date  . Rotator cuff repair Right 2004  . Prostate biopsy    . Hemorrhoidectomy with hemorrhoid banding  2007  . Uvulopalatopharyngoplasty  2002    w/ septoplasty and rhinoplasty  . Tonsillectomy  AS CHILD  . Knee arthroscopy Right 2012  . Inguinal hernia repair Bilateral     x4  (2 each side)  . Radioactive seed implant N/A 05/30/2013    Procedure: RADIOACTIVE SEED IMPLANT;  Surgeon: Lindaann Slough, MD;  Location: [facility];  Service: Urology;  Laterality: N/A;   Family History  Problem Relation Age of Onset  . Cancer Mother     Pancreatic Cancer  . Heart disease Father 87    CABG   History  Substance Use Topics  . Smoking status: Former Smoker -- 1.00 packs/day for 20 years    Types: Cigarettes    Quit date: 08/11/1988  . Smokeless tobacco: Never Used  . Alcohol Use: Yes     Comment: occasionally    Review of Systems  Musculoskeletal: Positive for arthralgias and myalgias.  Skin: Positive for wound ( abrasion).  Neurological: Positive for weakness. Negative  for syncope and numbness.  All other systems reviewed and are negative.    Allergies  Codeine  Home Medications   Current Outpatient Rx  Name  Route  Sig  Dispense  Refill  . aspirin 81 MG tablet   Oral   Take 81 mg by mouth daily.         Marland Kitchen atorvastatin (LIPITOR) 40 MG tablet   Oral   Take 40 mg by mouth every evening. TAKE 1 TABLET EVERY DAY         . ciprofloxacin (CIPRO) 500 MG tablet   Oral   Take 1 tablet (500 mg total) by mouth 2 (two) times daily.   20 tablet   0   . clonazePAM (KLONOPIN) 1 MG tablet   Oral   Take 1 tablet (1 mg total) by mouth 3 (three) times daily as needed for anxiety.   270 tablet   1   .  HYDROcodone-acetaminophen (NORCO) 5-325 MG per tablet   Oral   Take 1 tablet by mouth every 6 (six) hours as needed for pain.   30 tablet   0   . levothyroxine (SYNTHROID, LEVOTHROID) 112 MCG tablet      TAKE 1 TABLET (112 MCG TOTAL) BY MOUTH DAILY.   takes in am         . meloxicam (MOBIC) 15 MG tablet   Oral   Take 15 mg by mouth daily.         Marland Kitchen omeprazole (PRILOSEC) 40 MG capsule   Oral   Take 40 mg by mouth every morning.         . venlafaxine XR (EFFEXOR-XR) 150 MG 24 hr capsule   Oral   Take 150 mg by mouth every morning.          Triage Vitals: BP 135/86  Pulse 85  Temp(Src) 98.2 F (36.8 C) (Oral)  Resp 16  Ht 5\' 10"  (1.778 m)  Wt 190 lb (86.183 kg)  BMI 27.26 kg/m2  SpO2 96%  Physical Exam  Nursing note and vitals reviewed. Constitutional: He is oriented to person, place, and time. He appears well-developed and well-nourished. No distress.  HENT:  Head: Normocephalic and atraumatic.  Eyes: Conjunctivae and EOM are normal. No scleral icterus.  Neck: Normal range of motion.  Cardiovascular: Intact distal pulses.   Pulses:      Dorsalis pedis pulses are 2+ on the right side, and 2+ on the left side.       Posterior tibial pulses are 2+ on the right side, and 2+ on the left side.  Pulmonary/Chest: Effort normal. No respiratory distress.  Musculoskeletal: Normal range of motion.  No tenderness to palpation along length of the spine.   Neurological: He is alert and oriented to person, place, and time. He has normal reflexes.  DTRs are normal and symmetric. Distal sensation intact.   Skin: Skin is warm and dry. No rash noted. He is not diaphoretic. No erythema. No pallor.  Psychiatric: He has a normal mood and affect. His behavior is normal.    ED Course  Procedures (including critical care time)  DIAGNOSTIC STUDIES: Oxygen Saturation is 96% on room air, normal by my interpretation.    COORDINATION OF CARE: 10:14 PM- Ordered an x-ray of the left  hip. Discussed that a CT of a pelvis may be necessary depending on the x-ray results. Will order Percocet to manage pain symptoms. Discussed treatment plan with patient at bedside and patient verbalized agreement.  Labs Review Labs Reviewed - No data to display  Imaging Review Dg Hip Complete Left  06/11/2013   CLINICAL DATA:  Left hip pain, fell off ladder tonight  EXAM: LEFT HIP - COMPLETE 2+ VIEW  COMPARISON:  None  FINDINGS: Osseous demineralization.  Hip and SI joints symmetric.  Brachytherapy seed implants at prostate.  Surgical clips in pelvis.  Nondisplaced fracture at inferior left pubic ramus.  Additionally, minimally displaced fracture identified through the left pubic body.  No femoral fracture or dislocation identified.  Disc space narrowing L4-L5.  IMPRESSION: Fractures of the left inferior pubic ramus and left pubic body.   Electronically Signed   By: Ulyses Southward M.D.   On: 06/11/2013 22:20    EKG Interpretation   None       MDM   1. Pelvic fracture, closed, initial encounter    67 year old male presents for left hip pain after a fall off of a ladder this evening. Patient neurovascularly intact with normal sensation and reflexes in his bilateral lower extremities. Patient found to have fractures of the left inferior pubic ramus as well as the left pubic body on hip x-ray. Patient has been ambulatory in the ED with crutches without difficulty. Have consulted with Dr. Victorino Dike of orthopedics; reviewed case with Dr. Victorino Dike who recommends outpatient follow up and ambulation with crutches and WBAT. Patient declines pain medicine in the ED. He is stable and appropriate for discharge with orthopedic followup. Percocet prescribed for severe pain. Return precautions discussed and patient agreeable to plan with no unaddressed concerns. Patient seen also by Dr. Juleen China who is in agreement with this workup, assessment, management plan, and patient's stability for discharge.   I personally  performed the services described in this documentation, which was scribed in my presence. The recorded information has been reviewed and is accurate.      Antony Madura, [place] 06/13/13 [phone]

## 2013-06-11 NOTE — ED Notes (Signed)
Pt states he fell from 3rd step on ladder @ 2030 onto tile bathroom floor. C/o L hip pain, no shortening or rotation noted.

## 2013-06-14 DIAGNOSIS — IMO0002 Reserved for concepts with insufficient information to code with codable children: Secondary | ICD-10-CM | POA: Diagnosis not present

## 2013-06-14 DIAGNOSIS — M1710 Unilateral primary osteoarthritis, unspecified knee: Secondary | ICD-10-CM | POA: Diagnosis not present

## 2013-06-16 NOTE — ED Provider Notes (Signed)
Medical screening examination/treatment/procedure(s) were conducted as a shared visit with non-physician practitioner(s) and myself.  I personally evaluated the patient during the encounter.  EKG Interpretation   None      67yM with pelvic fxs. Discussed with ortho by Carmie Kanner, PA-C. WBAT. PRN pain meds. Ortho FU.   Raeford Razor, MD 06/16/13 [phone]

## 2013-06-21 DIAGNOSIS — C61 Malignant neoplasm of prostate: Secondary | ICD-10-CM | POA: Diagnosis not present

## 2013-06-22 ENCOUNTER — Telehealth: Payer: Self-pay | Admitting: *Deleted

## 2013-06-22 NOTE — Telephone Encounter (Signed)
Called patient to inform that appts. For 06-30-13 have been moved down due to Dr. Kathrynn Running being in the OR, lvm for a return call

## 2013-06-29 ENCOUNTER — Telehealth: Payer: Self-pay | Admitting: *Deleted

## 2013-06-29 NOTE — Telephone Encounter (Signed)
CALLED PATIENT TO REMIND OF APPTS. FOR 06-30-13, LVM FOR A RETURN CALL

## 2013-06-30 ENCOUNTER — Ambulatory Visit
Admission: RE | Admit: 2013-06-30 | Discharge: 2013-06-30 | Disposition: A | Discharge status: Home / Self Care | Payer: Medicare Other | Source: Ambulatory Visit | Attending: Radiation Oncology | Admitting: Radiation Oncology

## 2013-06-30 ENCOUNTER — Ambulatory Visit: Payer: Medicare Other | Admitting: Radiation Oncology

## 2013-06-30 VITALS — BP 156/96 | HR 90 | Temp 97.7°F

## 2013-06-30 DIAGNOSIS — C61 Malignant neoplasm of prostate: Secondary | ICD-10-CM

## 2013-06-30 DIAGNOSIS — R972 Elevated prostate specific antigen [PSA]: Secondary | ICD-10-CM

## 2013-06-30 MED ORDER — TAMSULOSIN HCL 0.4 MG PO CAPS: 0.4000 mg | ORAL_CAPSULE | Freq: Every day | ORAL | Status: DC

## 2013-06-30 NOTE — Progress Notes (Signed)
Bryan Tyler here with his wife for post seed visit.  He fell off a ladder 11/1 and fractured his pelvis in 2 places.  He is having pain in his pelvis which he is rating at a 7/10.  He is taking 4 percocets per day for this.  His IPSS score today is 23.  He reports getting up 8 times per night to urinate.   He was started on Vesicare andmyrbetriq by Dr. Brunilda Payor for the frequency which is making his mouth very dry.  He reports seeing a little bit of blood which makes his urinary stream pink when beginning to urinate.  He denies diarrhea.

## 2013-07-01 ENCOUNTER — Encounter: Payer: Self-pay | Admitting: Radiation Oncology

## 2013-07-01 NOTE — Progress Notes (Signed)
  Radiation Oncology         [phone] ________________________________  Name: Bryan Tyler MRN: [id number]  Date: 06/30/2013  DOB: 10-03-1945  COMPLEX SIMULATION NOTE  NARRATIVE:  The patient was brought to the CT Simulation planning suite today following prostate seed implantation approximately one month ago.  Identity was confirmed.  All relevant records and images related to the planned course of therapy were reviewed.  Then, the patient was set-up supine.  CT images were obtained.  The CT images were loaded into the planning software.  Then the prostate and rectum were contoured.  Treatment planning then occurred.  The implanted iodine 125 seeds were identified by the physics staff for projection of radiation distribution  I have requested : 3D Simulation  I have requested a DVH of the following structures: Prostate and rectum.    ________________________________  Artist Pais Kathrynn Running, M.D.

## 2013-07-01 NOTE — Progress Notes (Signed)
Radiation Oncology         [phone] ________________________________  Name: Bryan Tyler MRN: [id number]  Date: 06/30/2013  DOB: 1945-09-27  Follow-Up Visit Note  CC: Bryan Belling, MD  Bryan Slough, MD  Diagnosis:   67 y.o. gentleman with stage T1c adenocarcinoma of the prostate with a Gleason's score of 3+4 and a PSA of 5.11  Interval Since Last Radiation:  4  weeks  Narrative: Bryan Tyler here with his wife for post seed visit. He fell off a ladder 11/1 and fractured his pelvis in 2 places. He is having pain in his pelvis which he is rating at a 7/10. He is taking 4 percocets per day for this.  The patient returns today for routine follow-up.  He is complaining of increased urinary frequency and urinary hesitation symptoms. He filled out a questionnaire regarding urinary function today providing and overall IPSS score of 23 characterizing his symptoms as severe. His pre-implant score was 8.  He reports getting up 8 times per night to urinate. He was started on Vesicare andmyrbetriq by Dr. Brunilda Payor for the frequency which is making his mouth very dry. He reports seeing a little bit of blood which makes his urinary stream pink when beginning to urinate. He denies diarrhea. He denies any bowel symptoms.  ALLERGIES:  is allergic to codeine.  Meds: Current Outpatient Prescriptions  Medication Sig Dispense Refill  . atorvastatin (LIPITOR) 40 MG tablet Take 40 mg by mouth every evening. TAKE 1 TABLET EVERY DAY      . clonazePAM (KLONOPIN) 1 MG tablet Take 1 tablet (1 mg total) by mouth 3 (three) times daily as needed for anxiety.  270 tablet  1  . levothyroxine (SYNTHROID, LEVOTHROID) 112 MCG tablet TAKE 1 TABLET (112 MCG TOTAL) BY MOUTH DAILY.   takes in am      . mirabegron ER (MYRBETRIQ) 25 MG TB24 tablet Take 25 mg by mouth daily.      Marland Kitchen omeprazole (PRILOSEC) 40 MG capsule Take 40 mg by mouth every morning.      Marland Kitchen oxyCODONE-acetaminophen (PERCOCET/ROXICET) 5-325 MG per tablet  Take 1-2 tablets by mouth every 6 (six) hours as needed for pain.  19 tablet  0  . solifenacin (VESICARE) 10 MG tablet Take 10 mg by mouth daily.      Marland Kitchen venlafaxine XR (EFFEXOR-XR) 150 MG 24 hr capsule Take 150 mg by mouth every morning.      Marland Kitchen aspirin 81 MG tablet Take 81 mg by mouth daily.      . ciprofloxacin (CIPRO) 500 MG tablet Take 1 tablet (500 mg total) by mouth 2 (two) times daily.  20 tablet  0  . HYDROcodone-acetaminophen (NORCO) 5-325 MG per tablet Take 1 tablet by mouth every 6 (six) hours as needed for pain.  30 tablet  0  . meloxicam (MOBIC) 15 MG tablet Take 15 mg by mouth daily.      . tamsulosin (FLOMAX) 0.4 MG CAPS capsule Take 1 capsule (0.4 mg total) by mouth at bedtime.  30 capsule  5   No current facility-administered medications for this encounter.    Physical Findings: The patient is in no acute distress. Patient is alert and oriented.  temperature is 97.7 F (36.5 C). His blood pressure is 156/96 and his pulse is 90. .  No significant changes.  Lab Findings: Lab Results  Component Value Date   WBC 5.4 05/24/2013   HGB 14.6 05/24/2013   HCT 44.1 05/24/2013   MCV  91.5 05/24/2013   PLT 226 05/24/2013    Radiographic Findings:  Patient underwent CT imaging in our clinic for post implant dosimetry. The CT appears to demonstrate an adequate distribution of radioactive seeds throughout the prostate gland. There no seeds in her near the rectum. I suspect the final radiation plan and dosimetry will show appropriate coverage of the prostate gland.   Impression: The patient is recovering from the effects of radiation. His urinary symptoms should gradually improve over the next 4-6 months. We talked about this today. He is encouraged by his improvement already and is otherwise please with his outcome.   Plan:   I gave him a prescription for Flomax 0.4 QHS for his urinary symptoms.  Today, I spent time talking to the patient about his prostate seed implant and  resolving urinary symptoms. We also talked about long-term follow-up for prostate cancer following seed implant. He understands that ongoing PSA determinations and digital rectal exams will help perform surveillance to rule out disease recurrence. He understands what to expect with his PSA measures. Patient was also educated today about some of the long-term effects from radiation including a small risk for rectal bleeding and possibly erectile dysfunction. We talked about some of the general management approaches to these potential complications. However, I did encourage the patient to contact our office or return at any point if he has questions or concerns related to his previous radiation and prostate cancer.  _____________________________________  Artist Pais. Kathrynn Running, M.D.

## 2013-07-12 DIAGNOSIS — S32509A Unspecified fracture of unspecified pubis, initial encounter for closed fracture: Secondary | ICD-10-CM | POA: Diagnosis not present

## 2013-07-14 ENCOUNTER — Encounter: Payer: Self-pay | Admitting: Radiation Oncology

## 2013-07-14 DIAGNOSIS — C61 Malignant neoplasm of prostate: Secondary | ICD-10-CM | POA: Diagnosis not present

## 2013-07-19 ENCOUNTER — Other Ambulatory Visit: Payer: Self-pay

## 2013-07-19 MED ORDER — LEVOTHYROXINE SODIUM 112 MCG PO TABS: ORAL_TABLET | ORAL | Status: DC

## 2013-07-19 NOTE — Telephone Encounter (Signed)
Refill for Synthroid sent to [facility] pharmacy.

## 2013-07-20 DIAGNOSIS — R351 Nocturia: Secondary | ICD-10-CM | POA: Diagnosis not present

## 2013-07-20 DIAGNOSIS — R300 Dysuria: Secondary | ICD-10-CM | POA: Diagnosis not present

## 2013-07-20 DIAGNOSIS — R259 Unspecified abnormal involuntary movements: Secondary | ICD-10-CM | POA: Diagnosis not present

## 2013-07-31 NOTE — Progress Notes (Signed)
  Radiation Oncology         [phone] ________________________________  Name: Bryan Tyler MRN: [id number]  Date: 07/14/2013  DOB: [birth year]  3-D Planning Note Prostate Brachytherapy  Diagnosis: 67 y.o. gentleman with stage T1c adenocarcinoma of the prostate with a Gleason's score of 3+4 and a PSA of 5.11  Narrative: On a previous date, Bryan Tyler returned following prostate seed implantation for post implant planning. He underwent CT scan complex simulation to delineate the three-dimensional structures of the pelvis and demonstrate the radiation distribution.  Since that time, the seed localization, and 3D planning with dose volume histograms have now been completed.  Results:   Prostate Coverage - The dose of radiation delivered to the 90% or more of the prostate gland (D90) was 115.7% of the prescription dose. This exceeds our goal of greater than 90%. Rectal Sparing - The volume of rectal tissue receiving the prescription dose or higher was 0.18 cc. This falls under our thresholds tolerance of 1.0 cc.  Impression: The prostate seed implant appears to show adequate target coverage and appropriate rectal sparing.  Plan:  The patient will continue to follow with urology for ongoing PSA determinations. I would anticipate a high likelihood for local tumor control with minimal risk for rectal morbidity.  ________________________________  Artist Pais Kathrynn Running, M.D.

## 2013-08-01 DIAGNOSIS — F339 Major depressive disorder, recurrent, unspecified: Secondary | ICD-10-CM | POA: Diagnosis not present

## 2013-08-10 DIAGNOSIS — S32509A Unspecified fracture of unspecified pubis, initial encounter for closed fracture: Secondary | ICD-10-CM | POA: Diagnosis not present

## 2013-08-22 DIAGNOSIS — R262 Difficulty in walking, not elsewhere classified: Secondary | ICD-10-CM | POA: Diagnosis not present

## 2013-08-25 DIAGNOSIS — R262 Difficulty in walking, not elsewhere classified: Secondary | ICD-10-CM | POA: Diagnosis not present

## 2013-08-29 DIAGNOSIS — R262 Difficulty in walking, not elsewhere classified: Secondary | ICD-10-CM | POA: Diagnosis not present

## 2013-09-02 DIAGNOSIS — R262 Difficulty in walking, not elsewhere classified: Secondary | ICD-10-CM | POA: Diagnosis not present

## 2013-09-08 DIAGNOSIS — R262 Difficulty in walking, not elsewhere classified: Secondary | ICD-10-CM | POA: Diagnosis not present

## 2013-09-13 DIAGNOSIS — C61 Malignant neoplasm of prostate: Secondary | ICD-10-CM | POA: Diagnosis not present

## 2013-09-15 ENCOUNTER — Encounter: Payer: Self-pay | Admitting: *Deleted

## 2013-09-15 DIAGNOSIS — R262 Difficulty in walking, not elsewhere classified: Secondary | ICD-10-CM | POA: Diagnosis not present

## 2013-09-21 DIAGNOSIS — N318 Other neuromuscular dysfunction of bladder: Secondary | ICD-10-CM | POA: Diagnosis not present

## 2013-09-21 DIAGNOSIS — C61 Malignant neoplasm of prostate: Secondary | ICD-10-CM | POA: Diagnosis not present

## 2013-09-22 DIAGNOSIS — R262 Difficulty in walking, not elsewhere classified: Secondary | ICD-10-CM | POA: Diagnosis not present

## 2013-09-27 DIAGNOSIS — R262 Difficulty in walking, not elsewhere classified: Secondary | ICD-10-CM | POA: Diagnosis not present

## 2013-10-10 DIAGNOSIS — L570 Actinic keratosis: Secondary | ICD-10-CM | POA: Diagnosis not present

## 2013-10-10 DIAGNOSIS — L738 Other specified follicular disorders: Secondary | ICD-10-CM | POA: Diagnosis not present

## 2013-10-10 DIAGNOSIS — D235 Other benign neoplasm of skin of trunk: Secondary | ICD-10-CM | POA: Diagnosis not present

## 2013-10-10 DIAGNOSIS — L820 Inflamed seborrheic keratosis: Secondary | ICD-10-CM | POA: Diagnosis not present

## 2013-10-18 DIAGNOSIS — F339 Major depressive disorder, recurrent, unspecified: Secondary | ICD-10-CM | POA: Diagnosis not present

## 2013-10-21 DIAGNOSIS — H9319 Tinnitus, unspecified ear: Secondary | ICD-10-CM | POA: Diagnosis not present

## 2013-10-21 DIAGNOSIS — H903 Sensorineural hearing loss, bilateral: Secondary | ICD-10-CM | POA: Diagnosis not present

## 2013-10-24 ENCOUNTER — Other Ambulatory Visit: Payer: Self-pay

## 2013-10-24 MED ORDER — LEVOTHYROXINE SODIUM 112 MCG PO TABS: ORAL_TABLET | ORAL | Status: DC

## 2013-11-18 ENCOUNTER — Telehealth: Payer: Self-pay | Admitting: Endocrinology

## 2013-11-18 DIAGNOSIS — K649 Unspecified hemorrhoids: Secondary | ICD-10-CM

## 2013-11-18 NOTE — Telephone Encounter (Signed)
Pt requesting a referral to an MD for hemorrhoid treatment

## 2013-11-21 ENCOUNTER — Encounter: Payer: Self-pay | Admitting: Gastroenterology

## 2013-11-21 NOTE — Telephone Encounter (Signed)
Pt informed via voicemail

## 2013-11-21 NOTE — Telephone Encounter (Signed)
See below and please advise, Thanks!  

## 2013-11-21 NOTE — Telephone Encounter (Signed)
done

## 2013-12-15 DIAGNOSIS — C61 Malignant neoplasm of prostate: Secondary | ICD-10-CM | POA: Diagnosis not present

## 2013-12-19 ENCOUNTER — Ambulatory Visit (INDEPENDENT_AMBULATORY_CARE_PROVIDER_SITE_OTHER): Payer: Medicare Other | Admitting: Gastroenterology

## 2013-12-19 ENCOUNTER — Encounter: Payer: Self-pay | Admitting: Gastroenterology

## 2013-12-19 VITALS — BP 110/72 | HR 80 | Ht 70.0 in | Wt 189.8 lb

## 2013-12-19 DIAGNOSIS — K645 Perianal venous thrombosis: Secondary | ICD-10-CM

## 2013-12-19 DIAGNOSIS — L290 Pruritus ani: Secondary | ICD-10-CM

## 2013-12-19 MED ORDER — HYDROCORTISONE ACE-PRAMOXINE 2.5-1 % RE CREA: 1.0000 "application " | TOPICAL_CREAM | Freq: Three times a day (TID) | RECTAL | Status: DC

## 2013-12-19 NOTE — Patient Instructions (Addendum)
We have sent the following medications to your pharmacy for you to pick up at your convenience: [place] will be due for a recall colonoscopy in 11/2017. We will send you a reminder in the mail when it gets closer to that time.  RECTAL CARE INSTRUCTIONS:  1. Sitz Baths twice a day for 10 minutes each. 2. Thoroughly clean and dry the rectum. 3. Put Tucks pad against the rectum at night. 4. Clean the rectum with Balenol lotion after each bowel movement.  Thank you for choosing me and [facility].  Pricilla Riffle. Dagoberto Ligas., MD., Marval Regal

## 2013-12-19 NOTE — Progress Notes (Signed)
    History of Present Illness: This is a 68 year old male who relates intermittent rectal itching and occasional rectal pressure. His symptoms have been present intermittently for several years. He underwent colonoscopy in 2009 showing a hyperplastic colon polyp and internal hemorrhoids were injected. He subsequently seen by Dr. Dalbert Batman underwent hemorrhoid injection 2012. He had a followup visit with Dr. Dalbert Batman in 2013 for rectal itching. He was treated with Analpram. He was diagnosed with prostate cancer last year and underwent radiation seed implantation. He notes rectal pressure with urination and frequent urination, particularly in the evenings. Denies weight loss, abdominal pain, constipation, diarrhea, change in stool caliber, melena, hematochezia, nausea, vomiting, dysphagia, reflux symptoms, chest pain.  Review of Systems: Pertinent positive and negative review of systems were noted in the above HPI section. All other review of systems were otherwise negative.  Current Medications, Allergies, Past Medical History, Past Surgical History, Family History and Social History were reviewed in [employer] record.  Physical Exam: General: Well developed , well nourished, no acute distress Head: Normocephalic and atraumatic Eyes:  sclerae anicteric, EOMI Ears: Normal auditory acuity Mouth: No deformity or lesions Neck: Supple, no masses or thyromegaly Lungs: Clear throughout to auscultation Heart: Regular rate and rhythm; no murmurs, rubs or bruits Abdomen: Soft, non tender and non distended. No masses, hepatosplenomegaly or hernias noted. Normal Bowel sounds Rectal: no internal or external lesions firm prostate, Hemoccult negative brown stool the vault Musculoskeletal: Symmetrical with no gross deformities  Skin: No lesions on visible extremities Pulses:  Normal pulses noted Extremities: No clubbing, cyanosis, edema or deformities noted Neurological: Alert oriented x 4,  grossly nonfocal Cervical Nodes:  No significant cervical adenopathy Inguinal Nodes: No significant inguinal adenopathy Psychological:  Alert and cooperative. Normal mood and affect  Assessment and Recommendations:  1. Rectal pressure and rectal itching. Presumed symptomatic internal hemorrhoids and pruritis ani. Some of rectal pressure likely related to his prostate disease. He has a appointment with Dr. Janice Norrie this week. Analpram 2.5 % and standard rectal care instructions. Colonoscopy for routine colorectal cancer screening in 2019.

## 2013-12-22 DIAGNOSIS — C61 Malignant neoplasm of prostate: Secondary | ICD-10-CM | POA: Diagnosis not present

## 2014-01-10 ENCOUNTER — Other Ambulatory Visit: Payer: Self-pay

## 2014-01-10 MED ORDER — LEVOTHYROXINE SODIUM 112 MCG PO TABS: ORAL_TABLET | ORAL | Status: DC

## 2014-02-02 DIAGNOSIS — M25569 Pain in unspecified knee: Secondary | ICD-10-CM | POA: Diagnosis not present

## 2014-02-02 DIAGNOSIS — F339 Major depressive disorder, recurrent, unspecified: Secondary | ICD-10-CM | POA: Diagnosis not present

## 2014-02-06 ENCOUNTER — Ambulatory Visit (INDEPENDENT_AMBULATORY_CARE_PROVIDER_SITE_OTHER): Payer: Medicare Other | Admitting: Endocrinology

## 2014-02-06 ENCOUNTER — Encounter: Payer: Self-pay | Admitting: Endocrinology

## 2014-02-06 VITALS — BP 117/74 | HR 83 | Temp 98.0°F | Ht 70.0 in | Wt 184.0 lb

## 2014-02-06 DIAGNOSIS — K649 Unspecified hemorrhoids: Secondary | ICD-10-CM

## 2014-02-06 DIAGNOSIS — E785 Hyperlipidemia, unspecified: Secondary | ICD-10-CM | POA: Diagnosis not present

## 2014-02-06 DIAGNOSIS — C61 Malignant neoplasm of prostate: Secondary | ICD-10-CM

## 2014-02-06 DIAGNOSIS — K648 Other hemorrhoids: Secondary | ICD-10-CM

## 2014-02-06 DIAGNOSIS — Z125 Encounter for screening for malignant neoplasm of prostate: Secondary | ICD-10-CM | POA: Diagnosis not present

## 2014-02-06 DIAGNOSIS — R972 Elevated prostate specific antigen [PSA]: Secondary | ICD-10-CM

## 2014-02-06 DIAGNOSIS — E018 Other iodine-deficiency related thyroid disorders and allied conditions: Secondary | ICD-10-CM

## 2014-02-06 DIAGNOSIS — Z23 Encounter for immunization: Secondary | ICD-10-CM | POA: Diagnosis not present

## 2014-02-06 DIAGNOSIS — Z0000 Encounter for general adult medical examination without abnormal findings: Secondary | ICD-10-CM

## 2014-02-06 DIAGNOSIS — Z79899 Other long term (current) drug therapy: Secondary | ICD-10-CM | POA: Diagnosis not present

## 2014-02-06 LAB — URINALYSIS, ROUTINE W REFLEX MICROSCOPIC
Bilirubin Urine: NEGATIVE
Hgb urine dipstick: NEGATIVE
Ketones, ur: NEGATIVE
LEUKOCYTES UA: NEGATIVE
NITRITE: NEGATIVE
PH: 5.5 (ref 5.0–8.0)
Specific Gravity, Urine: 1.025 (ref 1.000–1.030)
Total Protein, Urine: NEGATIVE
Urine Glucose: NEGATIVE
Urobilinogen, UA: 0.2 (ref 0.0–1.0)

## 2014-02-06 LAB — CBC WITH DIFFERENTIAL/PLATELET
Basophils Absolute: 0 10*3/uL (ref 0.0–0.1)
Basophils Relative: 0.3 % (ref 0.0–3.0)
Eosinophils Absolute: 0.4 10*3/uL (ref 0.0–0.7)
Eosinophils Relative: 5.2 % — ABNORMAL HIGH (ref 0.0–5.0)
HCT: 44 % (ref 39.0–52.0)
Hemoglobin: 14.6 g/dL (ref 13.0–17.0)
Lymphocytes Relative: 29 % (ref 12.0–46.0)
Lymphs Abs: 2.1 10*3/uL (ref 0.7–4.0)
MCHC: 33.3 g/dL (ref 30.0–36.0)
MCV: 92.3 fl (ref 78.0–100.0)
MONOS PCT: 11 % (ref 3.0–12.0)
Monocytes Absolute: 0.8 10*3/uL (ref 0.1–1.0)
Neutro Abs: 4 10*3/uL (ref 1.4–7.7)
Neutrophils Relative %: 54.5 % (ref 43.0–77.0)
Platelets: 240 10*3/uL (ref 150.0–400.0)
RBC: 4.77 Mil/uL (ref 4.22–5.81)
RDW: 13.8 % (ref 11.5–15.5)
WBC: 7.3 10*3/uL (ref 4.0–10.5)

## 2014-02-06 LAB — BASIC METABOLIC PANEL
BUN: 17 mg/dL (ref 6–23)
CO2: 28 mEq/L (ref 19–32)
Calcium: 9.3 mg/dL (ref 8.4–10.5)
Chloride: 100 mEq/L (ref 96–112)
Creatinine, Ser: 1 mg/dL (ref 0.4–1.5)
GFR: 83.46 mL/min (ref >60.00)
GLUCOSE: 104 mg/dL — AB (ref 70–99)
POTASSIUM: 4.4 meq/L (ref 3.5–5.1)
SODIUM: 138 meq/L (ref 135–145)

## 2014-02-06 LAB — LIPID PANEL
CHOLESTEROL: 178 mg/dL (ref 0–200)
HDL: 55.5 mg/dL (ref >39.00)
LDL CALC: 100 mg/dL — AB (ref 0–99)
NONHDL: 122.5
Total CHOL/HDL Ratio: 3
Triglycerides: 114 mg/dL (ref 0.0–149.0)
VLDL: 22.8 mg/dL (ref 0.0–40.0)

## 2014-02-06 LAB — HEPATIC FUNCTION PANEL
ALT: 19 U/L (ref 0–53)
AST: 20 U/L (ref 0–37)
Albumin: 4.5 g/dL (ref 3.5–5.2)
Alkaline Phosphatase: 61 U/L (ref 39–117)
Bilirubin, Direct: 0.2 mg/dL (ref 0.0–0.3)
Total Bilirubin: 0.9 mg/dL (ref 0.2–1.2)
Total Protein: 7.2 g/dL (ref 6.0–8.3)

## 2014-02-06 LAB — TSH: TSH: 1.7 u[IU]/mL (ref 0.35–4.50)

## 2014-02-06 LAB — PSA: PSA: 0.33 ng/mL (ref 0.10–4.00)

## 2014-02-06 MED ORDER — LEVOTHYROXINE SODIUM 112 MCG PO TABS: ORAL_TABLET | ORAL | Status: DC

## 2014-02-06 MED ORDER — OMEPRAZOLE 40 MG PO CPDR: 40.0000 mg | DELAYED_RELEASE_CAPSULE | Freq: Every morning | ORAL | Status: DC

## 2014-02-06 MED ORDER — MELOXICAM 15 MG PO TABS: 15.0000 mg | ORAL_TABLET | Freq: Every day | ORAL | Status: DC

## 2014-02-06 MED ORDER — ATORVASTATIN CALCIUM 40 MG PO TABS: 40.0000 mg | ORAL_TABLET | Freq: Every evening | ORAL | Status: DC

## 2014-02-06 NOTE — Patient Instructions (Signed)
please consider these measures for your health:  minimize alcohol.  do not use tobacco products.  have a colonoscopy at least every 10 years from age 68.  always use seat belts.  have working smoke alarms in your home.  see an eye doctor and dentist regularly.  never drive under the influence of alcohol or drugs (including prescription drugs).  those with fair skin should take precautions against the sun. blood tests are being requested for you today.  We'll contact you with results. good diet and exercise habits significanly improve your health.  please let me know if you wish to be referred to a dietician. you should see an eye doctor and dentist every year.    Please return in 1 year.

## 2014-02-06 NOTE — Progress Notes (Signed)
Subjective:    Patient ID: Bryan Tyler, male    DOB: 30-Dec-1945, 68 y.o.   MRN: [id number]  HPI The state of at least three ongoing medical problems is addressed today, with interval history of each noted here: BRBPR: none recently. Hypothyroidism: he has lost a few lbs, due to his efforts. Dyslipidemia: he denies chest pain. Past Medical History  Diagnosis Date  . Hyperlipidemia   . Arthritis   . Adenocarcinoma of prostate 04/16/2013  . GERD (gastroesophageal reflux disease)   . Hypothyroidism, postradioiodine therapy     1980's  . Anxiety   . Osteoporosis   . Depression   . Organic impotence   . History of hyperthyroidism   . Nocturia   . Urge urinary incontinence   . OSA (obstructive sleep apnea)     NO CPAP--  S/P SURGERY  2002  . Wears contact lenses   . Diverticulosis     Past Surgical History  Procedure Laterality Date  . Rotator cuff repair Right 2004  . Prostate biopsy    . Hemorrhoidectomy with hemorrhoid banding  2007  . Uvulopalatopharyngoplasty  2002    w/ septoplasty and rhinoplasty  . Tonsillectomy  AS CHILD  . Knee arthroscopy Right 2012  . Inguinal hernia repair Bilateral     x4  (2 each side)  . Radioactive seed implant N/A 05/30/2013    Procedure: RADIOACTIVE SEED IMPLANT;  Surgeon: Hanley Ben, MD;  Location: [facility];  Service: Urology;  Laterality: N/A;    History   Social History  . Marital Status: Married    Spouse Name: N/A    Number of Children: N/A  . Years of Education: N/A   Occupational History  . Middle School Teacher    Social History Main Topics  . Smoking status: Former Smoker -- 1.00 packs/day for 20 years    Types: Cigarettes    Quit date: 08/11/1988  . Smokeless tobacco: Current User  . Alcohol Use: No     Comment: occasionally  . Drug Use: No  . Sexual Activity: Not on file   Other Topics Concern  . Not on file   Social History Narrative  . No narrative on file    Current  Outpatient Prescriptions on File Prior to Visit  Medication Sig Dispense Refill  . aspirin 81 MG tablet Take 81 mg by mouth daily.      . clonazePAM (KLONOPIN) 1 MG tablet Take 1 tablet (1 mg total) by mouth 3 (three) times daily as needed for anxiety.  270 tablet  1  . tamsulosin (FLOMAX) 0.4 MG CAPS capsule Take 1 capsule (0.4 mg total) by mouth at bedtime.  30 capsule  5  . traZODone (DESYREL) 100 MG tablet Take 100 mg by mouth at bedtime as needed for sleep.      Marland Kitchen venlafaxine XR (EFFEXOR-XR) 150 MG 24 hr capsule Take 150 mg by mouth every morning.      . hydrocortisone-pramoxine (ANALPRAM-HC) 2.5-1 % rectal cream Place 1 application rectally 3 (three) times daily.  30 g  1   No current facility-administered medications on file prior to visit.    Allergies  Allergen Reactions  . Codeine Nausea And Vomiting and Other (See Comments)    DIZZINESS    Family History  Problem Relation Age of Onset  . Cancer Mother     Pancreatic Cancer  . Heart disease Father 15    CABG    BP 117/74  Pulse 83  Temp(Src) 98 F (36.7 C) (Oral)  Ht 5\' 10"  (1.778 m)  Wt 184 lb (83.462 kg)  BMI 26.40 kg/m2  SpO2 95%  Review of Systems Denies sob.  GERD is well-controlled    Objective:   Physical Exam VITAL SIGNS:  See vs page GENERAL: no distress LUNGS:  Clear to auscultation HEART:  Regular rate and rhythm without murmurs noted. Normal S1,S2.   Pulses:  no carotid bruit Ext: no edema  Lab Results  Component Value Date   WBC 7.3 02/06/2014   HGB 14.6 02/06/2014   HCT 44.0 02/06/2014   PLT 240.0 02/06/2014   GLUCOSE 104* 02/06/2014   CHOL 178 02/06/2014   TRIG 114.0 02/06/2014   HDL 55.50 02/06/2014   LDLCALC 100* 02/06/2014   ALT 19 02/06/2014   AST 20 02/06/2014   NA 138 02/06/2014   K 4.4 02/06/2014   CL 100 02/06/2014   CREATININE 1.0 02/06/2014   BUN 17 02/06/2014   CO2 28 02/06/2014   TSH 1.70 02/06/2014   PSA 0.33 02/06/2014   INR 0.93 05/24/2013       Assessment & Plan:    Dyslipidemia: well-controlled Hypothyroidism: well-replaced BRBPR: none recently.  No anemia is found.    Patient is advised the following: Please continue the same medications    Subjective:   Patient here for Medicare annual wellness visit and management of other chronic and acute problems.    Risk factors: advanced age    88 of Physicians Providing Medical Care to Patient:  See "snapshot"   Activities of Daily Living: In your present state of health, do you have any difficulty performing the following activities?:  Preparing food and eating?: No  Bathing yourself: No  Getting dressed: No  Using the toilet: No  Moving around from place to place: No  In the past year have you fallen or had a near fall?: No    Home Safety: Has smoke detector and wears seat belts. No firearms. No excess sun exposure.  Diet and Exercise  Current exercise habits: pt says good Dietary issues discussed: pt reports a healthy diet   Depression Screen  Q1: Over the past two weeks, have you felt down, depressed or hopeless? no  Q2: Over the past two weeks, have you felt little interest or pleasure in doing things? no   The following portions of the patient's history were reviewed and updated as appropriate: allergies, current medications, past family history, past medical history, past social history, past surgical history and problem list.   Review of Systems  No change in chronic hearing loss; denies visual loss Objective:   Vision:  Sees opthalmologist Hearing: grossly normal Body mass index:  See vs page Msk: pt easily and quickly performs "get-up-and-go" from a sitting position Cognitive Impairment Assessment: cognition, memory and judgment appear normal.  remembers 3/3 at 5 minutes.  excellent recall.  can easily read and write a sentence.  alert and oriented x 3.     Assessment:   Medicare wellness utd on preventive parameters.     Plan:   During the course of the visit the  patient was educated and counseled about appropriate screening and preventive services including:        Fall prevention.    Diabetes screening is ordered today Nutrition counseling.  PSA  Patient Instructions (the written plan) was given to the patient.

## 2014-03-07 DIAGNOSIS — M25569 Pain in unspecified knee: Secondary | ICD-10-CM | POA: Diagnosis not present

## 2014-03-07 DIAGNOSIS — M1710 Unilateral primary osteoarthritis, unspecified knee: Secondary | ICD-10-CM | POA: Diagnosis not present

## 2014-03-16 DIAGNOSIS — M1710 Unilateral primary osteoarthritis, unspecified knee: Secondary | ICD-10-CM | POA: Diagnosis not present

## 2014-03-20 DIAGNOSIS — C61 Malignant neoplasm of prostate: Secondary | ICD-10-CM | POA: Diagnosis not present

## 2014-03-23 DIAGNOSIS — M1710 Unilateral primary osteoarthritis, unspecified knee: Secondary | ICD-10-CM | POA: Diagnosis not present

## 2014-03-25 ENCOUNTER — Encounter: Payer: Self-pay | Admitting: Endocrinology

## 2014-03-27 DIAGNOSIS — T07XXXA Unspecified multiple injuries, initial encounter: Secondary | ICD-10-CM | POA: Diagnosis not present

## 2014-03-27 DIAGNOSIS — C61 Malignant neoplasm of prostate: Secondary | ICD-10-CM | POA: Diagnosis not present

## 2014-03-27 DIAGNOSIS — T148XXA Other injury of unspecified body region, initial encounter: Secondary | ICD-10-CM | POA: Diagnosis not present

## 2014-03-27 DIAGNOSIS — L570 Actinic keratosis: Secondary | ICD-10-CM | POA: Diagnosis not present

## 2014-03-31 DIAGNOSIS — M1710 Unilateral primary osteoarthritis, unspecified knee: Secondary | ICD-10-CM | POA: Diagnosis not present

## 2014-04-06 DIAGNOSIS — M1710 Unilateral primary osteoarthritis, unspecified knee: Secondary | ICD-10-CM | POA: Diagnosis not present

## 2014-04-29 ENCOUNTER — Other Ambulatory Visit: Payer: Self-pay | Admitting: Radiation Oncology

## 2014-06-04 ENCOUNTER — Other Ambulatory Visit: Payer: Self-pay | Admitting: Endocrinology

## 2014-06-21 DIAGNOSIS — C61 Malignant neoplasm of prostate: Secondary | ICD-10-CM | POA: Diagnosis not present

## 2014-06-21 DIAGNOSIS — F339 Major depressive disorder, recurrent, unspecified: Secondary | ICD-10-CM | POA: Diagnosis not present

## 2014-06-29 DIAGNOSIS — C61 Malignant neoplasm of prostate: Secondary | ICD-10-CM | POA: Diagnosis not present

## 2014-06-29 DIAGNOSIS — N3281 Overactive bladder: Secondary | ICD-10-CM | POA: Diagnosis not present

## 2014-07-17 DIAGNOSIS — F339 Major depressive disorder, recurrent, unspecified: Secondary | ICD-10-CM | POA: Diagnosis not present

## 2014-08-11 HISTORY — PX: SHOULDER ARTHROSCOPY W/ ROTATOR CUFF REPAIR: SHX2400

## 2014-08-15 ENCOUNTER — Encounter: Payer: Self-pay | Admitting: Endocrinology

## 2014-08-15 ENCOUNTER — Ambulatory Visit (INDEPENDENT_AMBULATORY_CARE_PROVIDER_SITE_OTHER): Payer: Medicare Other | Admitting: Endocrinology

## 2014-08-15 VITALS — BP 142/96 | HR 86 | Temp 97.6°F | Ht 70.0 in | Wt 195.0 lb

## 2014-08-15 DIAGNOSIS — R031 Nonspecific low blood-pressure reading: Secondary | ICD-10-CM

## 2014-08-15 DIAGNOSIS — K625 Hemorrhage of anus and rectum: Secondary | ICD-10-CM | POA: Diagnosis not present

## 2014-08-15 DIAGNOSIS — IMO0002 Reserved for concepts with insufficient information to code with codable children: Secondary | ICD-10-CM

## 2014-08-15 NOTE — Patient Instructions (Signed)
Please see a specialist.  you will receive a phone call, about a day and time for an appointment. If your blood pressure is still high at the specialist's office, please come back here for recheck.   blood tests are being requested for you today.  We'll let you know about the results.

## 2014-08-15 NOTE — Progress Notes (Signed)
Subjective:    Patient ID: Bryan Tyler, male    DOB: 31-Aug-1945, 69 y.o.   MRN: [id number]  HPI Pt states few weeks of slight bleeding from the rectum, and assoc abd bloating.   Past Medical History  Diagnosis Date  . Hyperlipidemia   . Arthritis   . Adenocarcinoma of prostate 04/16/2013  . GERD (gastroesophageal reflux disease)   . Hypothyroidism, postradioiodine therapy     1980's  . Anxiety   . Osteoporosis   . Depression   . Organic impotence   . History of hyperthyroidism   . Nocturia   . Urge urinary incontinence   . OSA (obstructive sleep apnea)     NO CPAP--  S/P SURGERY  2002  . Wears contact lenses   . Diverticulosis     Past Surgical History  Procedure Laterality Date  . Rotator cuff repair Right 2004  . Prostate biopsy    . Hemorrhoidectomy with hemorrhoid banding  2007  . Uvulopalatopharyngoplasty  2002    w/ septoplasty and rhinoplasty  . Tonsillectomy  AS CHILD  . Knee arthroscopy Right 2012  . Inguinal hernia repair Bilateral     x4  (2 each side)  . Radioactive seed implant N/A 05/30/2013    Procedure: RADIOACTIVE SEED IMPLANT;  Surgeon: Hanley Ben, MD;  Location: [facility];  Service: Urology;  Laterality: N/A;    History   Social History  . Marital Status: Married    Spouse Name: N/A    Number of Children: N/A  . Years of Education: N/A   Occupational History  . Middle School Teacher    Social History Main Topics  . Smoking status: Former Smoker -- 1.00 packs/day for 20 years    Types: Cigarettes    Quit date: 08/11/1988  . Smokeless tobacco: Current User  . Alcohol Use: No     Comment: occasionally  . Drug Use: No  . Sexual Activity: Not on file   Other Topics Concern  . Not on file   Social History Narrative    Current Outpatient Prescriptions on File Prior to Visit  Medication Sig Dispense Refill  . aspirin 81 MG tablet Take 81 mg by mouth daily.    Marland Kitchen atorvastatin (LIPITOR) 40 MG tablet Take 1  tablet (40 mg total) by mouth every evening. TAKE 1 TABLET EVERY DAY 90 tablet 3  . clonazePAM (KLONOPIN) 1 MG tablet TAKE 1 TABLET BY MOUTH 3 TIMES A DAY 270 tablet 1  . levothyroxine (SYNTHROID, LEVOTHROID) 112 MCG tablet TAKE 1 TABLET (112 MCG TOTAL) BY MOUTH DAILY.   takes in am 90 tablet 3  . omeprazole (PRILOSEC) 40 MG capsule Take 1 capsule (40 mg total) by mouth every morning. 90 capsule 3  . traZODone (DESYREL) 100 MG tablet Take 100 mg by mouth at bedtime as needed for sleep.    Marland Kitchen venlafaxine XR (EFFEXOR-XR) 150 MG 24 hr capsule Take 150 mg by mouth every morning.    . hydrocortisone-pramoxine (ANALPRAM-HC) 2.5-1 % rectal cream Place 1 application rectally 3 (three) times daily. (Patient not taking: Reported on 08/15/2014) 30 g 1  . meloxicam (MOBIC) 15 MG tablet Take 1 tablet (15 mg total) by mouth daily. (Patient not taking: Reported on 08/15/2014) 90 tablet 3  . tamsulosin (FLOMAX) 0.4 MG CAPS capsule TAKE 1 CAPSULE (0.4 MG TOTAL) BY MOUTH AT BEDTIME. (Patient not taking: Reported on 08/15/2014) 30 capsule 5   No current facility-administered medications on file prior to visit.  Allergies  Allergen Reactions  . Codeine Nausea And Vomiting and Other (See Comments)    DIZZINESS    Family History  Problem Relation Age of Onset  . Cancer Mother     Pancreatic Cancer  . Heart disease Father 66    CABG    BP 142/96 mmHg  Pulse 86  Temp(Src) 97.6 F (36.4 C) (Oral)  Ht 5\' 10"  (1.778 m)  Wt 195 lb (88.451 kg)  BMI 27.98 kg/m2  SpO2 97%   Review of Systems Denies sob and chest pain.      Objective:   Physical Exam VITAL SIGNS:  See vs page GENERAL: no distress ABDOMEN: abdomen is soft, nontender.  no hepatosplenomegaly. not distended.  no hernia anus: no blood seen externally.     Lab Results  Component Value Date   WBC 7.3 02/06/2014   HGB 14.6 02/06/2014   HCT 44.0 02/06/2014   MCV 92.3 02/06/2014   PLT 240.0 02/06/2014       Assessment & Plan:  BRBPR,  new, uncertain etiology HTN: with situational component.  Patient is advised the following: Patient Instructions  Please see a specialist.  you will receive a phone call, about a day and time for an appointment. If your blood pressure is still high at the specialist's office, please come back here for recheck.   blood tests are being requested for you today.  We'll let you know about the results.

## 2014-08-16 ENCOUNTER — Other Ambulatory Visit: Payer: Self-pay | Admitting: Endocrinology

## 2014-08-16 LAB — CBC WITH DIFFERENTIAL/PLATELET
BASOS ABS: 0 10*3/uL (ref 0.0–0.1)
Basophils Relative: 0 % (ref 0–1)
EOS PCT: 4 % (ref 0–5)
Eosinophils Absolute: 0.3 10*3/uL (ref 0.0–0.7)
HEMATOCRIT: 41.5 % (ref 39.0–52.0)
Hemoglobin: 13.9 g/dL (ref 13.0–17.0)
LYMPHS PCT: 34 % (ref 12–46)
Lymphs Abs: 2.3 10*3/uL (ref 0.7–4.0)
MCH: 30.2 pg (ref 26.0–34.0)
MCHC: 33.5 g/dL (ref 30.0–36.0)
MCV: 90 fL (ref 78.0–100.0)
MONOS PCT: 10 % (ref 3–12)
MPV: 9.8 fL (ref 8.6–12.4)
Monocytes Absolute: 0.7 10*3/uL (ref 0.1–1.0)
NEUTROS ABS: 3.5 10*3/uL (ref 1.7–7.7)
Neutrophils Relative %: 52 % (ref 43–77)
Platelets: 247 10*3/uL (ref 150–400)
RBC: 4.61 MIL/uL (ref 4.22–5.81)
RDW: 13.5 % (ref 11.5–15.5)
WBC: 6.7 10*3/uL (ref 4.0–10.5)

## 2014-08-16 LAB — IRON AND TIBC
%SAT: 35 % (ref 20–55)
IRON: 113 ug/dL (ref 42–165)
TIBC: 324 ug/dL (ref 215–435)
UIBC: 211 ug/dL (ref 125–400)

## 2014-08-17 ENCOUNTER — Encounter: Payer: Self-pay | Admitting: Endocrinology

## 2014-08-30 ENCOUNTER — Encounter: Payer: Self-pay | Admitting: Gastroenterology

## 2014-08-30 ENCOUNTER — Ambulatory Visit (INDEPENDENT_AMBULATORY_CARE_PROVIDER_SITE_OTHER): Payer: Medicare Other | Admitting: Gastroenterology

## 2014-08-30 VITALS — BP 138/78 | HR 88 | Ht 70.0 in | Wt 197.1 lb

## 2014-08-30 DIAGNOSIS — K921 Melena: Secondary | ICD-10-CM | POA: Diagnosis not present

## 2014-08-30 DIAGNOSIS — L290 Pruritus ani: Secondary | ICD-10-CM

## 2014-08-30 MED ORDER — PEG-KCL-NACL-NASULF-NA ASC-C 100 G PO SOLR: 1.0000 | Freq: Once | ORAL | Status: DC

## 2014-08-30 NOTE — Patient Instructions (Signed)
You have been scheduled for a colonoscopy. Please follow written instructions given to you at your visit today.  Please pick up your prep kit at the pharmacy within the next 1-3 days. If you use inhalers (even only as needed), please bring them with you on the day of your procedure.  Thank you for choosing me and [facility].  Malcolm T. Stark, Jr., MD., FACG   

## 2014-08-30 NOTE — Progress Notes (Signed)
History of Present Illness: This is a 69 year old male with a 1-2 month history of small amounts of bright red blood per rectum occurring intermittently. Symptoms generally occur with bowel movements. He does note occasional rectal itching and some mucus per rectum about 30 minutes after a bowel movement. He underwent radiation seed implants for prostate cancer in October 2014. He has a history of internal hemorrhoids which have been treated on several occasions and most recently by injection sclerosis in 2009 and in 2012. His last colonoscopy was in 2009. Denies weight loss, abdominal pain, constipation, diarrhea, change in stool caliber, melena, nausea, vomiting, dysphagia, reflux symptoms, chest pain.   Allergies  Allergen Reactions  . Codeine Nausea And Vomiting and Other (See Comments)    DIZZINESS   Outpatient Prescriptions Prior to Visit  Medication Sig Dispense Refill  . aspirin 81 MG tablet Take 81 mg by mouth daily.    Marland Kitchen atorvastatin (LIPITOR) 40 MG tablet Take 1 tablet (40 mg total) by mouth every evening. TAKE 1 TABLET EVERY DAY 90 tablet 3  . clonazePAM (KLONOPIN) 1 MG tablet TAKE 1 TABLET BY MOUTH 3 TIMES A DAY 270 tablet 1  . levothyroxine (SYNTHROID, LEVOTHROID) 112 MCG tablet TAKE 1 TABLET (112 MCG TOTAL) BY MOUTH DAILY.   takes in am 90 tablet 3  . meloxicam (MOBIC) 15 MG tablet Take 1 tablet (15 mg total) by mouth daily. 90 tablet 3  . omeprazole (PRILOSEC) 40 MG capsule Take 1 capsule (40 mg total) by mouth every morning. 90 capsule 3  . traZODone (DESYREL) 100 MG tablet Take 100 mg by mouth at bedtime as needed for sleep.    Marland Kitchen venlafaxine XR (EFFEXOR-XR) 150 MG 24 hr capsule Take 150 mg by mouth every morning.    . hydrocortisone-pramoxine (ANALPRAM-HC) 2.5-1 % rectal cream Place 1 application rectally 3 (three) times daily. 30 g 1  . tamsulosin (FLOMAX) 0.4 MG CAPS capsule TAKE 1 CAPSULE (0.4 MG TOTAL) BY MOUTH AT BEDTIME. 30 capsule 5   No facility-administered  medications prior to visit.   Past Medical History  Diagnosis Date  . Hyperlipidemia   . Arthritis   . Adenocarcinoma of prostate 04/16/2013  . GERD (gastroesophageal reflux disease)   . Hypothyroidism, postradioiodine therapy     1980's  . Anxiety   . Osteoporosis   . Depression   . Organic impotence   . History of hyperthyroidism   . Nocturia   . Urge urinary incontinence   . OSA (obstructive sleep apnea)     NO CPAP--  S/P SURGERY  2002  . Wears contact lenses   . Diverticulosis    Past Surgical History  Procedure Laterality Date  . Rotator cuff repair Right 2004  . Prostate biopsy    . Hemorrhoidectomy with hemorrhoid banding  2007  . Uvulopalatopharyngoplasty  2002    w/ septoplasty and rhinoplasty  . Tonsillectomy  AS CHILD  . Knee arthroscopy Right 2012  . Inguinal hernia repair Bilateral     x4  (2 each side)  . Radioactive seed implant N/A 05/30/2013    Procedure: RADIOACTIVE SEED IMPLANT;  Surgeon: Hanley Ben, MD;  Location: [facility];  Service: Urology;  Laterality: N/A;   History   Social History  . Marital Status: Married    Spouse Name: N/A    Number of Children: N/A  . Years of Education: N/A   Occupational History  . Middle School Teacher    Social History Main Topics  .  Smoking status: Former Smoker -- 1.00 packs/day for 20 years    Types: Cigarettes    Quit date: 08/11/1988  . Smokeless tobacco: Current User  . Alcohol Use: No     Comment: occasionally  . Drug Use: No  . Sexual Activity: None   Other Topics Concern  . None   Social History Narrative   Family History  Problem Relation Age of Onset  . Cancer Mother     Pancreatic Cancer  . Heart disease Father 49    CABG    Physical Exam: General: Well developed , well nourished, no acute distress Head: Normocephalic and atraumatic Eyes:  sclerae anicteric, EOMI Ears: Normal auditory acuity Mouth: No deformity or lesions Lungs: Clear throughout to  auscultation Heart: Regular rate and rhythm; no murmurs, rubs or bruits Abdomen: Soft, non tender and non distended. No masses, hepatosplenomegaly or hernias noted. Normal Bowel sounds Rectal: Deferred to colonoscopy Musculoskeletal: Symmetrical with no gross deformities  Pulses:  Normal pulses noted Extremities: No clubbing, cyanosis, edema or deformities noted Neurological: Alert oriented x 4, grossly nonfocal Psychological:  Alert and cooperative. Normal mood and affect  Assessment and Recommendations:  1. Hematochezia. Anal pruritus. Rule out radiation proctitis, internal hemorrhoids, neoplasms and other disorders. Schedule colonoscopy. The risks, benefits, and alternatives to colonoscopy with possible biopsy and possible polypectomy were discussed with the patient and they consent to proceed. Consider a trial of Canasa suppositories. If he has significant internal or external hemorrhoids will plan to proceed with surgical referral.

## 2014-09-04 ENCOUNTER — Encounter: Payer: Self-pay | Admitting: Gastroenterology

## 2014-09-04 ENCOUNTER — Ambulatory Visit (AMBULATORY_SURGERY_CENTER): Payer: Medicare Other | Admitting: Gastroenterology

## 2014-09-04 VITALS — BP 136/88 | HR 68 | Temp 96.1°F | Resp 15 | Ht 70.0 in | Wt 197.0 lb

## 2014-09-04 DIAGNOSIS — K627 Radiation proctitis: Secondary | ICD-10-CM | POA: Diagnosis not present

## 2014-09-04 DIAGNOSIS — F341 Dysthymic disorder: Secondary | ICD-10-CM | POA: Diagnosis not present

## 2014-09-04 DIAGNOSIS — K921 Melena: Secondary | ICD-10-CM | POA: Diagnosis not present

## 2014-09-04 DIAGNOSIS — Z886 Allergy status to analgesic agent status: Secondary | ICD-10-CM | POA: Diagnosis not present

## 2014-09-04 MED ORDER — MESALAMINE 1000 MG RE SUPP
1000.0000 mg | Freq: Every day | RECTAL | Status: DC
Start: 2014-09-04 — End: 2017-09-02

## 2014-09-04 MED ORDER — SODIUM CHLORIDE 0.9 % IV SOLN: 500.0000 mL | INTRAVENOUS | Status: DC

## 2014-09-04 NOTE — Patient Instructions (Signed)
Discharge instructions given. Handouts on diverticulosis and a high fiber diet. Resume previous medications. Prescription sent into pharmacy. YOU HAD AN ENDOSCOPIC PROCEDURE TODAY AT [facility]: Refer to the procedure report that was given to you for any specific questions about what was found during the examination.  If the procedure report does not answer your questions, please call your gastroenterologist to clarify.  If you requested that your care partner not be given the details of your procedure findings, then the procedure report has been included in a sealed envelope for you to review at your convenience later.  YOU SHOULD EXPECT: Some feelings of bloating in the abdomen. Passage of more gas than usual.  Walking can help get rid of the air that was put into your GI tract during the procedure and reduce the bloating. If you had a lower endoscopy (such as a colonoscopy or flexible sigmoidoscopy) you may notice spotting of blood in your stool or on the toilet paper. If you underwent a bowel prep for your procedure, then you may not have a normal bowel movement for a few days.  DIET: Your first meal following the procedure should be a light meal and then it is ok to progress to your normal diet.  A half-sandwich or bowl of soup is an example of a good first meal.  Heavy or fried foods are harder to digest and may make you feel nauseous or bloated.  Likewise meals heavy in dairy and vegetables can cause extra gas to form and this can also increase the bloating.  Drink plenty of fluids but you should avoid alcoholic beverages for 24 hours.  ACTIVITY: Your care partner should take you home directly after the procedure.  You should plan to take it easy, moving slowly for the rest of the day.  You can resume normal activity the day after the procedure however you should NOT DRIVE or use heavy machinery for 24 hours (because of the sedation medicines used during the test).    SYMPTOMS TO  REPORT IMMEDIATELY: A gastroenterologist can be reached at any hour.  During normal business hours, 8:30 AM to 5:00 PM Monday through Friday, call [phone].  After hours and on weekends, please call the GI answering service at [phone] who will take a message and have the physician on call contact you.   Following lower endoscopy (colonoscopy or flexible sigmoidoscopy):  Excessive amounts of blood in the stool  Significant tenderness or worsening of abdominal pains  Swelling of the abdomen that is new, acute  Fever of 100F or higher FOLLOW UP: If any biopsies were taken you will be contacted by phone or by letter within the next 1-3 weeks.  Call your gastroenterologist if you have not heard about the biopsies in 3 weeks.  Our staff will call the home number listed on your records the next business day following your procedure to check on you and address any questions or concerns that you may have at that time regarding the information given to you following your procedure. This is a courtesy call and so if there is no answer at the home number and we have not heard from you through the emergency physician on call, we will assume that you have returned to your regular daily activities without incident.  SIGNATURES/CONFIDENTIALITY: You and/or your care partner have signed paperwork which will be entered into your electronic medical record.  These signatures attest to the fact that that the information above on your  After Visit Summary has been reviewed and is understood.  Full responsibility of the confidentiality of this discharge information lies with you and/or your care-partner.

## 2014-09-04 NOTE — Op Note (Signed)
[place]  [address]. [place], [id number]   COLONOSCOPY PROCEDURE REPORT  PATIENT: Bryan Tyler, Bryan Tyler  MR#: [id number] BIRTHDATE: 20-Dec-1945 , 68  yrs. old GENDER: male ENDOSCOPIST: Ladene Artist, MD, [facility] PROCEDURE DATE:  09/04/2014 PROCEDURE:   Colonoscopy, diagnostic First Screening Colonoscopy - Avg.  risk and is 50 yrs.  old or older - No.  Prior Negative Screening - Now for repeat screening. N/A  History of Adenoma - Now for follow-up colonoscopy & has been > or = to 3 yrs.  N/A  Polyps Removed Today? No.  Polyps Removed Today? No.  Recommend repeat exam, <10 yrs? Polyps Removed Today? No.  Recommend repeat exam, <10 yrs? No. ASA CLASS:   Class II INDICATIONS:hematochezia. MEDICATIONS: Monitored anesthesia care and Propofol 200 mg IV DESCRIPTION OF PROCEDURE:   After the risks benefits and alternatives of the procedure were thoroughly explained, informed consent was obtained.  The digital rectal exam revealed no abnormalities of the rectum.   The [id number]  endoscope was introduced through the anus and advanced to the cecum, which was identified by both the appendix and ileocecal valve. No adverse events experienced.   The quality of the prep was adequate, using MoviPrep  The instrument was then slowly withdrawn as the colon was fully examined.    COLON FINDINGS: There was mild diverticulosis noted in the sigmoid colon.   Inflammation was found secondary to radiation proctitis in the rectum.   The examination was otherwise normal.  Retroflexed views revealed no abnormalities. The time to cecum=3 minutes 39 seconds.  Withdrawal time=11 minutes 37 seconds.  The scope was withdrawn and the procedure completed. COMPLICATIONS: There were no immediate complications.  ENDOSCOPIC IMPRESSION: 1.   Mild diverticulosis in the sigmoid colon 2.   Mild radiation proctitis 3.   The examination was otherwise normal  RECOMMENDATIONS: 1.  High fiber  diet with liberal fluid intake. 2.  Canasa 1000 mg supp pr daily, #30, 1 refills 2.  You should continue to follow colorectal cancer screening guidelines for "routine risk" patients with a repeat colonoscopy in 10 years.  There is no need for routine, screening FOBT (stool) testing for at least 5 years.  eSigned:  Ladene Artist, MD, [facility] 09/04/2014 9:07 AM

## 2014-09-04 NOTE — Progress Notes (Signed)
Report to PACU, RN, vss, BBS= Clear.  

## 2014-09-05 ENCOUNTER — Telehealth: Payer: Self-pay | Admitting: *Deleted

## 2014-09-05 NOTE — Telephone Encounter (Signed)
  Follow up Call-  Call back number 09/04/2014  Post procedure Call Back phone  # [phone]  Permission to leave phone message Yes     Patient questions:  Do you have a fever, pain , or abdominal swelling? No. Pain Score  0 *  Have you tolerated food without any problems? Yes.    Have you been able to return to your normal activities? Yes.    Do you have any questions about your discharge instructions: Diet   No. Medications  No. Follow up visit  No.  Do you have questions or concerns about your Care? No.  Actions: * If pain score is 4 or above: No action needed, pain <4.  Pt. Did state that he has had nasal issues with nose running from oxygen,instructed pt. To use saline spray and if doesn't help call back,pt. Stated everything with procedure was great.

## 2014-09-21 DIAGNOSIS — F339 Major depressive disorder, recurrent, unspecified: Secondary | ICD-10-CM | POA: Diagnosis not present

## 2014-09-22 ENCOUNTER — Telehealth: Payer: Self-pay | Admitting: Endocrinology

## 2014-09-22 MED ORDER — LOSARTAN POTASSIUM 50 MG PO TABS: 50.0000 mg | ORAL_TABLET | Freq: Every day | ORAL | Status: DC

## 2014-09-22 NOTE — Telephone Encounter (Signed)
Requested call back to discuss.  

## 2014-09-22 NOTE — Telephone Encounter (Signed)
Wife says BP is still up. i told wife, i have sent a prescription to your pharmacy, for a blood pressure pill. Ret as scheduled

## 2014-09-22 NOTE — Telephone Encounter (Signed)
Pt advised of note below and voiced understanding,

## 2014-09-28 DIAGNOSIS — C61 Malignant neoplasm of prostate: Secondary | ICD-10-CM | POA: Diagnosis not present

## 2014-10-04 DIAGNOSIS — N3281 Overactive bladder: Secondary | ICD-10-CM | POA: Diagnosis not present

## 2014-10-04 DIAGNOSIS — R351 Nocturia: Secondary | ICD-10-CM | POA: Diagnosis not present

## 2014-10-04 DIAGNOSIS — C61 Malignant neoplasm of prostate: Secondary | ICD-10-CM | POA: Diagnosis not present

## 2014-10-04 DIAGNOSIS — N529 Male erectile dysfunction, unspecified: Secondary | ICD-10-CM | POA: Diagnosis not present

## 2014-11-11 ENCOUNTER — Encounter: Payer: Self-pay | Admitting: Endocrinology

## 2014-11-13 ENCOUNTER — Other Ambulatory Visit: Payer: Self-pay | Admitting: Endocrinology

## 2014-11-13 MED ORDER — LOSARTAN POTASSIUM 100 MG PO TABS: 100.0000 mg | ORAL_TABLET | Freq: Every day | ORAL | Status: DC

## 2014-12-19 ENCOUNTER — Other Ambulatory Visit: Payer: Self-pay | Admitting: Endocrinology

## 2014-12-19 NOTE — Telephone Encounter (Signed)
Please advise if ok to refill rx. Medication is listed under a historical provider. Thanks!

## 2015-01-02 DIAGNOSIS — C61 Malignant neoplasm of prostate: Secondary | ICD-10-CM | POA: Diagnosis not present

## 2015-01-10 DIAGNOSIS — C61 Malignant neoplasm of prostate: Secondary | ICD-10-CM | POA: Diagnosis not present

## 2015-01-12 ENCOUNTER — Telehealth: Payer: Self-pay | Admitting: Endocrinology

## 2015-01-12 NOTE — Telephone Encounter (Signed)
Rec'd from [facility] forward 4 pages to [place]

## 2015-01-23 DIAGNOSIS — F339 Major depressive disorder, recurrent, unspecified: Secondary | ICD-10-CM | POA: Diagnosis not present

## 2015-01-30 ENCOUNTER — Other Ambulatory Visit: Payer: Self-pay | Admitting: Endocrinology

## 2015-02-08 ENCOUNTER — Other Ambulatory Visit: Payer: Self-pay | Admitting: Endocrinology

## 2015-02-14 DIAGNOSIS — L570 Actinic keratosis: Secondary | ICD-10-CM | POA: Diagnosis not present

## 2015-02-14 DIAGNOSIS — L820 Inflamed seborrheic keratosis: Secondary | ICD-10-CM | POA: Diagnosis not present

## 2015-02-14 DIAGNOSIS — Z1283 Encounter for screening for malignant neoplasm of skin: Secondary | ICD-10-CM | POA: Diagnosis not present

## 2015-02-14 DIAGNOSIS — X32XXXD Exposure to sunlight, subsequent encounter: Secondary | ICD-10-CM | POA: Diagnosis not present

## 2015-03-02 ENCOUNTER — Encounter: Payer: Self-pay | Admitting: Endocrinology

## 2015-03-02 ENCOUNTER — Ambulatory Visit (INDEPENDENT_AMBULATORY_CARE_PROVIDER_SITE_OTHER): Payer: Medicare Other | Admitting: Endocrinology

## 2015-03-02 VITALS — BP 118/78 | HR 77 | Temp 97.6°F | Ht 70.0 in | Wt 181.0 lb

## 2015-03-02 DIAGNOSIS — M810 Age-related osteoporosis without current pathological fracture: Secondary | ICD-10-CM | POA: Diagnosis not present

## 2015-03-02 DIAGNOSIS — T887XXD Unspecified adverse effect of drug or medicament, subsequent encounter: Secondary | ICD-10-CM

## 2015-03-02 DIAGNOSIS — E785 Hyperlipidemia, unspecified: Secondary | ICD-10-CM | POA: Diagnosis not present

## 2015-03-02 DIAGNOSIS — IMO0002 Reserved for concepts with insufficient information to code with codable children: Secondary | ICD-10-CM

## 2015-03-02 DIAGNOSIS — E018 Other iodine-deficiency related thyroid disorders and allied conditions: Secondary | ICD-10-CM | POA: Diagnosis not present

## 2015-03-02 DIAGNOSIS — R031 Nonspecific low blood-pressure reading: Secondary | ICD-10-CM

## 2015-03-02 DIAGNOSIS — Z125 Encounter for screening for malignant neoplasm of prostate: Secondary | ICD-10-CM

## 2015-03-02 DIAGNOSIS — T50905D Adverse effect of unspecified drugs, medicaments and biological substances, subsequent encounter: Secondary | ICD-10-CM

## 2015-03-02 DIAGNOSIS — Z0000 Encounter for general adult medical examination without abnormal findings: Secondary | ICD-10-CM

## 2015-03-02 DIAGNOSIS — R972 Elevated prostate specific antigen [PSA]: Secondary | ICD-10-CM

## 2015-03-02 LAB — URINALYSIS, ROUTINE W REFLEX MICROSCOPIC
Bilirubin Urine: NEGATIVE
HGB URINE DIPSTICK: NEGATIVE
KETONES UR: NEGATIVE
LEUKOCYTES UA: NEGATIVE
Nitrite: NEGATIVE
Specific Gravity, Urine: >=1.03 — AB (ref 1.000–1.030)
TOTAL PROTEIN, URINE-UPE24: NEGATIVE
UROBILINOGEN UA: 0.2 (ref 0.0–1.0)
Urine Glucose: NEGATIVE
pH: 5.5 (ref 5.0–8.0)

## 2015-03-02 LAB — CBC WITH DIFFERENTIAL/PLATELET
Basophils Absolute: 0 10*3/uL (ref 0.0–0.1)
Basophils Relative: 0.6 % (ref 0.0–3.0)
EOS PCT: 4.3 % (ref 0.0–5.0)
Eosinophils Absolute: 0.3 10*3/uL (ref 0.0–0.7)
HCT: 44.4 % (ref 39.0–52.0)
Hemoglobin: 14.6 g/dL (ref 13.0–17.0)
Lymphocytes Relative: 29 % (ref 12.0–46.0)
Lymphs Abs: 1.9 10*3/uL (ref 0.7–4.0)
MCHC: 32.9 g/dL (ref 30.0–36.0)
MCV: 92.9 fl (ref 78.0–100.0)
MONO ABS: 0.8 10*3/uL (ref 0.1–1.0)
Monocytes Relative: 13.1 % — ABNORMAL HIGH (ref 3.0–12.0)
Neutro Abs: 3.4 10*3/uL (ref 1.4–7.7)
Neutrophils Relative %: 53 % (ref 43.0–77.0)
Platelets: 258 10*3/uL (ref 150.0–400.0)
RBC: 4.78 Mil/uL (ref 4.22–5.81)
RDW: 13.7 % (ref 11.5–15.5)
WBC: 6.5 10*3/uL (ref 4.0–10.5)

## 2015-03-02 LAB — LIPID PANEL
CHOL/HDL RATIO: 3
Cholesterol: 158 mg/dL (ref 0–200)
HDL: 48 mg/dL (ref >39.00)
NONHDL: 110
TRIGLYCERIDES: 283 mg/dL — AB (ref 0.0–149.0)
VLDL: 56.6 mg/dL — AB (ref 0.0–40.0)

## 2015-03-02 LAB — HEPATIC FUNCTION PANEL
ALT: 18 U/L (ref 0–53)
AST: 21 U/L (ref 0–37)
Albumin: 4.2 g/dL (ref 3.5–5.2)
Alkaline Phosphatase: 62 U/L (ref 39–117)
Bilirubin, Direct: 0.1 mg/dL (ref 0.0–0.3)
Total Bilirubin: 0.7 mg/dL (ref 0.2–1.2)
Total Protein: 6.3 g/dL (ref 6.0–8.3)

## 2015-03-02 LAB — BASIC METABOLIC PANEL
BUN: 15 mg/dL (ref 6–23)
CO2: 31 mEq/L (ref 19–32)
Calcium: 9.4 mg/dL (ref 8.4–10.5)
Chloride: 101 mEq/L (ref 96–112)
Creatinine, Ser: 0.9 mg/dL (ref 0.40–1.50)
GFR: 88.56 mL/min (ref >60.00)
Glucose, Bld: 75 mg/dL (ref 70–99)
POTASSIUM: 4 meq/L (ref 3.5–5.1)
Sodium: 140 mEq/L (ref 135–145)

## 2015-03-02 LAB — TSH: TSH: 4.02 u[IU]/mL (ref 0.35–4.50)

## 2015-03-02 LAB — PSA, MEDICARE: PSA: 0.15 ng/ml (ref 0.10–4.00)

## 2015-03-02 LAB — LDL CHOLESTEROL, DIRECT: LDL DIRECT: 84 mg/dL

## 2015-03-02 NOTE — Progress Notes (Signed)
we discussed code status.  pt requests full code, but would not want to be started or maintained on artificial life-support measures if there was not a reasonable chance of recovery 

## 2015-03-02 NOTE — Progress Notes (Signed)
Subjective:    Patient ID: Bryan Tyler, male    DOB: 01-Oct-1945, 69 y.o.   MRN: [id number]  HPI  The state of at least three ongoing medical problems is addressed today, with interval history of each noted here: Dyslipidemia: he has lost weight, due to his efforts.  Prostate cancer: he has intermittently decreased urinary stream. HTN: he denies chest pain Past Medical History  Diagnosis Date  . Hyperlipidemia   . Arthritis   . Adenocarcinoma of prostate 04/16/2013  . GERD (gastroesophageal reflux disease)   . Hypothyroidism, postradioiodine therapy     1980's  . Anxiety   . Osteoporosis   . Depression   . Organic impotence   . History of hyperthyroidism   . Nocturia   . Urge urinary incontinence   . OSA (obstructive sleep apnea)     NO CPAP--  S/P SURGERY  2002  . Wears contact lenses   . Diverticulosis     Past Surgical History  Procedure Laterality Date  . Rotator cuff repair Right 2004  . Prostate biopsy    . Hemorrhoidectomy with hemorrhoid banding  2007  . Uvulopalatopharyngoplasty  2002    w/ septoplasty and rhinoplasty  . Tonsillectomy  AS CHILD  . Knee arthroscopy Right 2012  . Inguinal hernia repair Bilateral     x4  (2 each side)  . Radioactive seed implant N/A 05/30/2013    Procedure: RADIOACTIVE SEED IMPLANT;  Surgeon: Hanley Ben, MD;  Location: [place];  Service: Urology;  Laterality: N/A;    History   Social History  . Marital Status: Married    Spouse Name: N/A  . Number of Children: N/A  . Years of Education: N/A   Occupational History  . Middle School Teacher    Social History Main Topics  . Smoking status: Former Smoker -- 1.00 packs/day for 20 years    Types: Cigarettes    Quit date: 08/11/1988  . Smokeless tobacco: Current User  . Alcohol Use: No     Comment: occasionally  . Drug Use: No  . Sexual Activity: Not on file   Other Topics Concern  . Not on file   Social History Narrative    Current  Outpatient Prescriptions on File Prior to Visit  Medication Sig Dispense Refill  . omeprazole (PRILOSEC) 40 MG capsule TAKE 1 CAPSULE (40 MG TOTAL) BY MOUTH EVERY MORNING. 90 capsule 3  . aspirin 81 MG tablet Take 81 mg by mouth daily.    Marland Kitchen atorvastatin (LIPITOR) 40 MG tablet TAKE 1 TABLET (40 MG TOTAL) BY MOUTH EVERY EVENING. 90 tablet 3  . clonazePAM (KLONOPIN) 1 MG tablet TAKE 1 TABLET BY MOUTH 3 TIMES A DAY 270 tablet 1  . levothyroxine (SYNTHROID, LEVOTHROID) 112 MCG tablet TAKE 1 TABLET (112 MCG TOTAL) BY MOUTH DAILY. TAKES IN AM 90 tablet 3  . losartan (COZAAR) 100 MG tablet Take 1 tablet (100 mg total) by mouth daily. 90 tablet 3  . meloxicam (MOBIC) 15 MG tablet Take 1 tablet (15 mg total) by mouth daily. (Patient not taking: Reported on 09/04/2014) 90 tablet 3  . mesalamine (CANASA) 1000 MG suppository Place 1 suppository (1,000 mg total) rectally at bedtime. 30 suppository 1  . traZODone (DESYREL) 100 MG tablet Take 100 mg by mouth at bedtime as needed for sleep.    Marland Kitchen venlafaxine XR (EFFEXOR-XR) 75 MG 24 hr capsule TAKE 3 CAPSULES EVERY DAY 90 capsule 0   No current facility-administered medications on  file prior to visit.    Allergies  Allergen Reactions  . Codeine Nausea And Vomiting and Other (See Comments)    DIZZINESS    Family History  Problem Relation Age of Onset  . Cancer Mother     Pancreatic Cancer  . Heart disease Father 38    CABG    BP 118/78 mmHg  Pulse 77  Temp(Src) 97.6 F (36.4 C) (Oral)  Ht 5\' 10"  (1.778 m)  Wt 181 lb (82.101 kg)  BMI 25.97 kg/m2  SpO2 97%  Review of Systems Denies sob and edema.    Objective:   Physical Exam VITAL SIGNS:  See vs page GENERAL: no distress. NECK: There is no palpable thyroid enlargement.  No thyroid nodule is palpable.  No palpable lymphadenopathy at the anterior neck. LUNGS:  Clear to auscultation HEART:  Regular rate and rhythm without murmurs noted. Normal S1,S2.   GENITALIA: sees urology.          Assessment & Plan:  Urinary sxs: he declines flomax Dyslipidemia, due for recheck.  Check labs today.  HTN: better with weight loss.    Subjective:   Patient here for Medicare annual wellness visit and management of other chronic and acute problems.     Risk factors: advanced age    42 of Physicians Providing Medical Care to Patient:  See "snapshot"   Activities of Daily Living: In your present state of health, do you have any difficulty performing the following activities?:  Preparing food and eating?: No  Bathing yourself: No  Getting dressed: No  Using the toilet:No  Moving around from place to place: No  In the past year have you fallen or had a near fall?: No    Home Safety: Has smoke detector and wears seat belts. No firearms. No excess sun exposure.  Diet and Exercise  Current exercise habits: pt says good Dietary issues discussed: pt reports a healthy diet   Depression Screen  Q1: Over the past two weeks, have you felt down, depressed or hopeless? Pt says well-controlled  Q2: Over the past two weeks, have you felt little interest or pleasure in doing things? no   The following portions of the patient's history were reviewed and updated as appropriate: allergies, current medications, past family history, past medical history, past social history, past surgical history and problem list.   Review of Systems  No change in chronic hearing loss; denies visual loss Objective:   Vision:  Advertising account executive, [facility] [facility] Hearing: grossly and slightly decreased Body mass index:  See vs page Msk: pt easily and quickly performs "get-up-and-go" from a sitting position Cognitive Impairment Assessment: cognition, memory and judgment appear normal.  remembers 3/3 at 5 minutes.  excellent recall.  can easily read and write a sentence.  alert and oriented x 3.    Assessment:   Medicare wellness utd on preventive parameters    Plan:   During the course of the visit  the patient was educated and counseled about appropriate screening and preventive services including:        Fall prevention   Diabetes screening  Nutrition counseling   Vaccines / LABS Zostavax / Pneumococcal Vaccine  today  PSA  Patient Instructions (the written plan) was given to the patient.

## 2015-03-02 NOTE — Patient Instructions (Addendum)
please consider these measures for your health:  minimize alcohol.  do not use tobacco products.  have a colonoscopy at least every 10 years from age 70.  keep firearms safely stored.  always use seat belts.  have working smoke alarms in your home.  see an eye doctor and dentist regularly.  never drive under the influence of alcohol or drugs (including prescription drugs).  those with fair skin should take precautions against the sun. good diet and exercise significantly improve your health.  please let me know if you wish to be referred to a dietician.  high blood sugar is very risky to your health.  you should see an eye doctor and dentist every year.  It is very important to get all recommended vaccinations.  it is critically important to prevent falling down (keep floor areas well-lit, dry, and free of loose objects.  If you have a cane, walker, or wheelchair, you should use it, even for short trips around the house.  Also, try not to rush).   blood tests are requested for you today.  We'll let you know about the results.

## 2015-03-05 LAB — PTH, INTACT AND CALCIUM
CALCIUM: 9.5 mg/dL (ref 8.4–10.5)
PTH: 51 pg/mL (ref 14–64)

## 2015-03-08 NOTE — Progress Notes (Signed)
   Subjective:    Patient ID: Bryan Tyler, male    DOB: 08-02-1946, 69 y.o.   MRN: [id number]  HPI    Review of Systems     Objective:   Physical Exam    i personally reviewed electrocardiogram tracing: normal to my reading    Assessment & Plan:

## 2015-04-12 DIAGNOSIS — C61 Malignant neoplasm of prostate: Secondary | ICD-10-CM | POA: Diagnosis not present

## 2015-04-17 DIAGNOSIS — F339 Major depressive disorder, recurrent, unspecified: Secondary | ICD-10-CM | POA: Diagnosis not present

## 2015-04-19 DIAGNOSIS — C61 Malignant neoplasm of prostate: Secondary | ICD-10-CM | POA: Diagnosis not present

## 2015-04-20 ENCOUNTER — Other Ambulatory Visit: Payer: Self-pay | Admitting: Endocrinology

## 2015-04-23 ENCOUNTER — Telehealth: Payer: Self-pay | Admitting: Endocrinology

## 2015-04-23 NOTE — Telephone Encounter (Signed)
Received records from [facility] forwarded 5 pages to Dr. Renato Shin 04/23/15 fbg.

## 2015-05-01 ENCOUNTER — Telehealth: Payer: Self-pay | Admitting: Endocrinology

## 2015-05-01 DIAGNOSIS — K648 Other hemorrhoids: Secondary | ICD-10-CM

## 2015-05-01 NOTE — Telephone Encounter (Signed)
i did referral 

## 2015-05-01 NOTE — Telephone Encounter (Signed)
See note below and please advise, Thanks! 

## 2015-05-01 NOTE — Telephone Encounter (Signed)
Patient Called stating that he noticed a hemorrhoid the size of a marble 4 days ago  He denies blood Bryan Tyler is concerned, he would like to know if Dr. Loanne Drilling needs to refer him out   Please advise patient  Contact through MyChart    Thank you

## 2015-05-01 NOTE — Telephone Encounter (Signed)
Left voicemail advising referral has been placed.

## 2015-05-31 ENCOUNTER — Ambulatory Visit (INDEPENDENT_AMBULATORY_CARE_PROVIDER_SITE_OTHER): Payer: Medicare Other | Admitting: Gastroenterology

## 2015-05-31 ENCOUNTER — Encounter: Payer: Self-pay | Admitting: Gastroenterology

## 2015-05-31 VITALS — BP 110/70 | HR 64 | Ht 70.0 in | Wt 185.8 lb

## 2015-05-31 DIAGNOSIS — M1711 Unilateral primary osteoarthritis, right knee: Secondary | ICD-10-CM | POA: Diagnosis not present

## 2015-05-31 DIAGNOSIS — H903 Sensorineural hearing loss, bilateral: Secondary | ICD-10-CM | POA: Diagnosis not present

## 2015-05-31 DIAGNOSIS — K5909 Other constipation: Secondary | ICD-10-CM

## 2015-05-31 DIAGNOSIS — K627 Radiation proctitis: Secondary | ICD-10-CM

## 2015-05-31 DIAGNOSIS — R143 Flatulence: Secondary | ICD-10-CM | POA: Diagnosis not present

## 2015-05-31 NOTE — Progress Notes (Signed)
    History of Present Illness: This is a 69 year old male with a history of prostate cancer treated with radiation seeds and mild radiation proctitis. He complains of intermittent episodes of constipation and straining as well as frequent problems with gas and flatulence. He states he has had 3 episodes of constipation in the past 6 months. They have generally been associated with traveling and pain medications. He notes occasional small amounts of bright red blood per rectum when straining. He has noted some anal swelling after straining that has resolved. He underwent colonoscopy in January with findings below.   COLONOSCOPY IMPRESSION 08/2014: 1. Mild diverticulosis in the sigmoid colon 2. Mild radiation proctitis 3. The examination was otherwise normal  Current Medications, Allergies, Past Medical History, Past Surgical History, Family History and Social History were reviewed in [employer] record.  Physical Exam: General: Well developed, well nourished, no acute distress Head: Normocephalic and atraumatic Eyes:  sclerae anicteric, EOMI Ears: Normal auditory acuity Mouth: No deformity or lesions Lungs: Clear throughout to auscultation Heart: Regular rate and rhythm; no murmurs, rubs or bruits Abdomen: Soft, non tender and non distended. No masses, hepatosplenomegaly or hernias noted. Normal Bowel sounds Musculoskeletal: Symmetrical with no gross deformities  Pulses:  Normal pulses noted Extremities: No clubbing, cyanosis, edema or deformities noted Neurological: Alert oriented x 4, grossly nonfocal Psychological:  Alert and cooperative. Normal mood and affect  Assessment and Recommendations:  1. Constipation. High fiber diet with adequate water intake. MiraLAX 1-2 times daily titrated for adequate bowel movements. Patient reassured that with recent colonoscopy no further evaluation is needed at this time. Follow-up with PCP.  2. Infrequent small-volume  hematochezia secondary to mild radiation proctitis. Patient reassured that with recent colonoscopy no further evaluation is needed at this time. Optimal management of constipation should help this problem.  3. Gas and flatulence. Low gas diet. Gas-X 4 times a day as needed. Consider trial of a daily probiotic. Follow-up with PCP.

## 2015-05-31 NOTE — Patient Instructions (Addendum)
You can use Miralax mixing 17 grams in 8 oz of water once or twice daily. Titrate your Miralax depending on your bowel movements.   You have been given a gas prevention diet.   You can use Gas-X over the counter four times a day as needed.   RECTAL CARE INSTRUCTIONS:  1. Sitz Baths twice a day for 10 minutes each. 2. Thoroughly clean and dry the rectum. 3. Put Tucks pad against the rectum at night. 4. Clean the rectum with Balenol lotion after each bowel movement.   High-Fiber Diet Fiber, also called dietary fiber, is a type of carbohydrate found in fruits, vegetables, whole grains, and beans. A high-fiber diet can have many health benefits. Your health care provider may recommend a high-fiber diet to help:  Prevent constipation. Fiber can make your bowel movements more regular.  Lower your cholesterol.  Relieve hemorrhoids, uncomplicated diverticulosis, or irritable bowel syndrome.  Prevent overeating as part of a weight-loss plan.  Prevent heart disease, type 2 diabetes, and certain cancers. WHAT IS MY PLAN? The recommended daily intake of fiber includes:  38 grams for men under age 45.  68 grams for men over age 65.  11 grams for women under age 73.  81 grams for women over age 86. You can get the recommended daily intake of dietary fiber by eating a variety of fruits, vegetables, grains, and beans. Your health care provider may also recommend a fiber supplement if it is not possible to get enough fiber through your diet. WHAT DO I NEED TO KNOW ABOUT A HIGH-FIBER DIET?  Fiber supplements have not been widely studied for their effectiveness, so it is better to get fiber through food sources.  Always check the fiber content on thenutrition facts label of any prepackaged food. Look for foods that contain at least 5 grams of fiber per serving.  Ask your dietitian if you have questions about specific foods that are related to your condition, especially if those foods are not  listed in the following section.  Increase your daily fiber consumption gradually. Increasing your intake of dietary fiber too quickly may cause bloating, cramping, or gas.  Drink plenty of water. Water helps you to digest fiber. WHAT FOODS CAN I EAT? Grains Whole-grain breads. Multigrain cereal. Oats and oatmeal. Brown rice. Barley. Bulgur wheat. [place]. Bran muffins. Popcorn. Rye wafer crackers. Vegetables Sweet potatoes. Spinach. Kale. Artichokes. Cabbage. Broccoli. Green peas. Carrots. Squash. Fruits Berries. Pears. Apples. Oranges. Avocados. Prunes and raisins. Dried figs. Meats and Other Protein Sources [place], kidney, pinto, and soy beans. Split peas. Lentils. Nuts and seeds. Dairy Fiber-fortified yogurt. Beverages Fiber-fortified soy milk. Fiber-fortified orange juice. Other Fiber bars. The items listed above may not be a complete list of recommended foods or beverages. Contact your dietitian for more options. WHAT FOODS ARE NOT RECOMMENDED? Grains White bread. Pasta made with refined flour. White rice. Vegetables Fried potatoes. Canned vegetables. Well-cooked vegetables.  Fruits Fruit juice. Cooked, strained fruit. Meats and Other Protein Sources Fatty cuts of meat. Fried Sales executive or fried fish. Dairy Milk. Yogurt. Cream cheese. Sour cream. Beverages Soft drinks. Other Cakes and pastries. Butter and oils. The items listed above may not be a complete list of foods and beverages to avoid. Contact your dietitian for more information. WHAT ARE SOME TIPS FOR INCLUDING HIGH-FIBER FOODS IN MY DIET?  Eat a wide variety of high-fiber foods.  Make sure that half of all grains consumed each day are whole grains.  Replace breads and cereals made  from refined flour or white flour with whole-grain breads and cereals.  Replace white rice with brown rice, bulgur wheat, or millet.  Start the day with a breakfast that is high in fiber, such as a cereal that contains at least 5  grams of fiber per serving.  Use beans in place of meat in soups, salads, or pasta.  Eat high-fiber snacks, such as berries, raw vegetables, nuts, or popcorn.   This information is not intended to replace advice given to you by your health care provider. Make sure you discuss any questions you have with your health care provider.   Document Released: 07/28/2005 Document Revised: 08/18/2014 Document Reviewed: 01/10/2014 Elsevier Interactive Patient Education 2016 [employer].   Please follow up with your primary care physician. No other Gastrointestinal care needed.   Thank you for choosing me and [facility].  Pricilla Riffle. Dagoberto Ligas., MD., Marval Regal  cc: Renato Shin, MD

## 2015-06-19 ENCOUNTER — Telehealth: Payer: Self-pay | Admitting: Gastroenterology

## 2015-06-19 NOTE — Telephone Encounter (Signed)
Patient reports that he is having constipation, no BM in several days. Marland Kitchen  He is uncomfortable and continues to strain, but not able to have a BM.  He took one dose of Miralax in 6 oz of fluid and a dose of MOM.  I explained that Miralax is slow acting and will need to take more doses.  He is advised to take 6-8 glasses today of Miralax.   He should drink each glasses 15 minutes apart.  He is advised that once he has results he should begin a regimen of daily Miralax as advised at his last office visit with Dr. Fuller Plan to prevent constipation.  He will call back for any additional questions or concerns.

## 2015-07-02 DIAGNOSIS — M1711 Unilateral primary osteoarthritis, right knee: Secondary | ICD-10-CM | POA: Diagnosis not present

## 2015-07-09 DIAGNOSIS — M1711 Unilateral primary osteoarthritis, right knee: Secondary | ICD-10-CM | POA: Diagnosis not present

## 2015-07-10 DIAGNOSIS — F339 Major depressive disorder, recurrent, unspecified: Secondary | ICD-10-CM | POA: Diagnosis not present

## 2015-07-16 DIAGNOSIS — M1711 Unilateral primary osteoarthritis, right knee: Secondary | ICD-10-CM | POA: Diagnosis not present

## 2015-07-23 DIAGNOSIS — M1711 Unilateral primary osteoarthritis, right knee: Secondary | ICD-10-CM | POA: Diagnosis not present

## 2015-07-30 DIAGNOSIS — M1711 Unilateral primary osteoarthritis, right knee: Secondary | ICD-10-CM | POA: Diagnosis not present

## 2015-08-24 DIAGNOSIS — F339 Major depressive disorder, recurrent, unspecified: Secondary | ICD-10-CM | POA: Diagnosis not present

## 2015-09-03 DIAGNOSIS — M1711 Unilateral primary osteoarthritis, right knee: Secondary | ICD-10-CM | POA: Diagnosis not present

## 2015-09-04 ENCOUNTER — Telehealth: Payer: Self-pay

## 2015-09-04 NOTE — Telephone Encounter (Signed)
I contacted the pt and left a voicemail advising him to call back to schedule his follow up appointment. Pt needs clearance for upcoming knee replacement and will not be provided clearance until his is evaluated by Dr. Loanne Drilling.

## 2015-09-14 ENCOUNTER — Ambulatory Visit (INDEPENDENT_AMBULATORY_CARE_PROVIDER_SITE_OTHER): Payer: Medicare Other | Admitting: Endocrinology

## 2015-09-14 VITALS — BP 138/94 | HR 64 | Temp 98.3°F | Ht 70.5 in | Wt 190.0 lb

## 2015-09-14 DIAGNOSIS — R031 Nonspecific low blood-pressure reading: Secondary | ICD-10-CM

## 2015-09-14 DIAGNOSIS — IMO0002 Reserved for concepts with insufficient information to code with codable children: Secondary | ICD-10-CM

## 2015-09-14 NOTE — Progress Notes (Signed)
Subjective:    Patient ID: Bryan Tyler, male    DOB: 1946-05-07, 70 y.o.   MRN: [id number]  HPI Pt is here to f/u HTN: he takes losartan as rx'ed.  pt states he feels well in general, except for depression.  He sees psychiatry.   Past Medical History  Diagnosis Date  . Hyperlipidemia   . Arthritis   . Adenocarcinoma of prostate ([place]) 04/16/2013  . GERD (gastroesophageal reflux disease)   . Hypothyroidism, postradioiodine therapy     1980's  . Anxiety   . Osteoporosis   . Depression   . Organic impotence   . History of hyperthyroidism   . Nocturia   . Urge urinary incontinence   . OSA (obstructive sleep apnea)     NO CPAP--  S/P SURGERY  2002  . Wears contact lenses   . Diverticulosis     Past Surgical History  Procedure Laterality Date  . Rotator cuff repair Right 2004  . Prostate biopsy    . Hemorrhoidectomy with hemorrhoid banding  2007  . Uvulopalatopharyngoplasty  2002    w/ septoplasty and rhinoplasty  . Tonsillectomy  AS CHILD  . Knee arthroscopy Right 2012  . Inguinal hernia repair Bilateral     x4  (2 each side)  . Radioactive seed implant N/A 05/30/2013    Procedure: RADIOACTIVE SEED IMPLANT;  Surgeon: Hanley Ben, MD;  Location: [place];  Service: Urology;  Laterality: N/A;    Social History   Social History  . Marital Status: Married    Spouse Name: N/A  . Number of Children: N/A  . Years of Education: N/A   Occupational History  . Middle School Teacher    Social History Main Topics  . Smoking status: Former Smoker -- 1.00 packs/day for 20 years    Types: Cigarettes    Quit date: 08/11/1988  . Smokeless tobacco: Current User  . Alcohol Use: No     Comment: occasionally  . Drug Use: No  . Sexual Activity: Not on file   Other Topics Concern  . Not on file   Social History Narrative    Current Outpatient Prescriptions on File Prior to Visit  Medication Sig Dispense Refill  . aspirin 81 MG tablet Take 81 mg by  mouth daily.    Marland Kitchen atorvastatin (LIPITOR) 40 MG tablet TAKE 1 TABLET (40 MG TOTAL) BY MOUTH EVERY EVENING. 90 tablet 3  . levothyroxine (SYNTHROID, LEVOTHROID) 112 MCG tablet TAKE 1 TABLET (112 MCG TOTAL) BY MOUTH DAILY. TAKES IN AM 90 tablet 3  . losartan (COZAAR) 100 MG tablet Take 1 tablet (100 mg total) by mouth daily. 90 tablet 3  . mesalamine (CANASA) 1000 MG suppository Place 1 suppository (1,000 mg total) rectally at bedtime. 30 suppository 1  . mirtazapine (REMERON) 15 MG tablet 30 mg.     . omeprazole (PRILOSEC) 40 MG capsule TAKE 1 CAPSULE (40 MG TOTAL) BY MOUTH EVERY MORNING. 90 capsule 3  . traZODone (DESYREL) 100 MG tablet Take 100 mg by mouth at bedtime as needed for sleep.    Marland Kitchen venlafaxine XR (EFFEXOR-XR) 75 MG 24 hr capsule TAKE 3 CAPSULES BY MOUTH EVERY DAY (Patient taking differently: 105 mg 2 caps everymorning) 90 capsule 2  . LORazepam (ATIVAN) 1 MG tablet Reported on 09/14/2015  2  . meloxicam (MOBIC) 15 MG tablet Take 1 tablet (15 mg total) by mouth daily. (Patient not taking: Reported on 09/14/2015) 90 tablet 3   No current facility-administered  medications on file prior to visit.    Allergies  Allergen Reactions  . Codeine Nausea And Vomiting and Other (See Comments)    DIZZINESS    Family History  Problem Relation Age of Onset  . Cancer Mother     Pancreatic Cancer  . Heart disease Father 81    CABG    BP 138/94 mmHg  Pulse 64  Temp(Src) 98.3 F (36.8 C) (Oral)  Ht 5' 10.5" (1.791 m)  Wt 190 lb (86.183 kg)  BMI 26.87 kg/m2  SpO2 96%  Review of Systems Denies chest pain and sob.      Objective:   Physical Exam VITAL SIGNS:  See vs page GENERAL: no distress LUNGS:  Clear to auscultation HEART:  Regular rate and rhythm without murmurs noted. Normal S1,S2.       Assessment & Plan:  HTN: with probable situational component  Patient is advised the following: Patient Instructions  Please continue the same medication for blood pressure for  now. Please come back for a follow-up appointment in 1 month. Please continue to check your blood pressure at home.  Call if it is high, so we can adjust meds sooner.

## 2015-09-14 NOTE — Patient Instructions (Signed)
Please continue the same medication for blood pressure for now. Please come back for a follow-up appointment in 1 month. Please continue to check your blood pressure at home.  Call if it is high, so we can adjust meds sooner.

## 2015-09-19 DIAGNOSIS — L820 Inflamed seborrheic keratosis: Secondary | ICD-10-CM | POA: Diagnosis not present

## 2015-09-19 DIAGNOSIS — L818 Other specified disorders of pigmentation: Secondary | ICD-10-CM | POA: Diagnosis not present

## 2015-10-02 DIAGNOSIS — H2513 Age-related nuclear cataract, bilateral: Secondary | ICD-10-CM | POA: Diagnosis not present

## 2015-10-02 DIAGNOSIS — H16223 Keratoconjunctivitis sicca, not specified as Sjogren's, bilateral: Secondary | ICD-10-CM | POA: Diagnosis not present

## 2015-10-11 ENCOUNTER — Ambulatory Visit: Payer: Medicare Other | Admitting: Endocrinology

## 2015-10-13 DIAGNOSIS — R591 Generalized enlarged lymph nodes: Secondary | ICD-10-CM | POA: Diagnosis not present

## 2015-10-13 DIAGNOSIS — J209 Acute bronchitis, unspecified: Secondary | ICD-10-CM | POA: Diagnosis not present

## 2015-10-15 ENCOUNTER — Ambulatory Visit (INDEPENDENT_AMBULATORY_CARE_PROVIDER_SITE_OTHER): Payer: Medicare Other | Admitting: Endocrinology

## 2015-10-15 ENCOUNTER — Encounter: Payer: Self-pay | Admitting: Endocrinology

## 2015-10-15 VITALS — BP 140/88 | HR 105 | Temp 98.4°F | Ht 70.0 in | Wt 191.0 lb

## 2015-10-15 DIAGNOSIS — I10 Essential (primary) hypertension: Secondary | ICD-10-CM | POA: Diagnosis not present

## 2015-10-15 MED ORDER — AMLODIPINE BESYLATE 2.5 MG PO TABS: 2.5000 mg | ORAL_TABLET | Freq: Every day | ORAL | Status: DC

## 2015-10-15 NOTE — Progress Notes (Signed)
Subjective:    Patient ID: Bryan Tyler, male    DOB: [birth year], 70 y.o.   MRN: [id number]  HPI Pt is here to f/u HTN: he takes losartan as rx'ed.  He has delayed his TKR.     He feels better since recent acute bronchitis.   Past Medical History  Diagnosis Date  . Hyperlipidemia   . Arthritis   . Adenocarcinoma of prostate ([place]) 04/16/2013  . GERD (gastroesophageal reflux disease)   . Hypothyroidism, postradioiodine therapy     1980's  . Anxiety   . Osteoporosis   . Depression   . Organic impotence   . History of hyperthyroidism   . Nocturia   . Urge urinary incontinence   . OSA (obstructive sleep apnea)     NO CPAP--  S/P SURGERY  2002  . Wears contact lenses   . Diverticulosis     Past Surgical History  Procedure Laterality Date  . Rotator cuff repair Right 2004  . Prostate biopsy    . Hemorrhoidectomy with hemorrhoid banding  2007  . Uvulopalatopharyngoplasty  2002    w/ septoplasty and rhinoplasty  . Tonsillectomy  AS CHILD  . Knee arthroscopy Right 2012  . Inguinal hernia repair Bilateral     x4  (2 each side)  . Radioactive seed implant N/A 05/30/2013    Procedure: RADIOACTIVE SEED IMPLANT;  Surgeon: Hanley Ben, MD;  Location: [place];  Service: Urology;  Laterality: N/A;    Social History   Social History  . Marital Status: Married    Spouse Name: N/A  . Number of Children: N/A  . Years of Education: N/A   Occupational History  . Middle School Teacher    Social History Main Topics  . Smoking status: Former Smoker -- 1.00 packs/day for 20 years    Types: Cigarettes    Quit date: 08/11/1988  . Smokeless tobacco: Current User  . Alcohol Use: No     Comment: occasionally  . Drug Use: No  . Sexual Activity: Not on file   Other Topics Concern  . Not on file   Social History Narrative    Current Outpatient Prescriptions on File Prior to Visit  Medication Sig Dispense Refill  . aspirin 81 MG tablet Take 81 mg by  mouth daily.    Marland Kitchen atorvastatin (LIPITOR) 40 MG tablet TAKE 1 TABLET (40 MG TOTAL) BY MOUTH EVERY EVENING. 90 tablet 3  . levothyroxine (SYNTHROID, LEVOTHROID) 112 MCG tablet TAKE 1 TABLET (112 MCG TOTAL) BY MOUTH DAILY. TAKES IN AM 90 tablet 3  . losartan (COZAAR) 100 MG tablet Take 1 tablet (100 mg total) by mouth daily. 90 tablet 3  . mesalamine (CANASA) 1000 MG suppository Place 1 suppository (1,000 mg total) rectally at bedtime. 30 suppository 1  . mirtazapine (REMERON) 15 MG tablet 30 mg.     . omeprazole (PRILOSEC) 40 MG capsule TAKE 1 CAPSULE (40 MG TOTAL) BY MOUTH EVERY MORNING. 90 capsule 3  . venlafaxine XR (EFFEXOR-XR) 75 MG 24 hr capsule TAKE 3 CAPSULES BY MOUTH EVERY DAY (Patient taking differently: 105 mg 2 caps everymorning) 90 capsule 2  . traZODone (DESYREL) 100 MG tablet Take 100 mg by mouth at bedtime as needed for sleep. Reported on 10/15/2015     No current facility-administered medications on file prior to visit.    Allergies  Allergen Reactions  . Codeine Nausea And Vomiting and Other (See Comments)    DIZZINESS    Family History  Problem Relation Age of Onset  . Cancer Mother     Pancreatic Cancer  . Heart disease Father 68    CABG    BP 140/88 mmHg  Pulse 105  Temp(Src) 98.4 F (36.9 C) (Oral)  Ht 5\' 10"  (1.778 m)  Wt 191 lb (86.637 kg)  BMI 27.41 kg/m2  SpO2 95%  Review of Systems Denies sob.  He has frequent urination.      Objective:   Physical Exam VITAL SIGNS:  See vs page GENERAL: no distress LUNGS:  Clear to auscultation.   Ext: no edema.       Assessment & Plan:  HTN: he needs increased rx  Patient is advised the following: Patient Instructions  i have sent a prescription to your pharmacy, to add another medication for blood pressure.  Please come back for a blood pressure recheck in 1 month, and: come back for a follow-up appointment in 6 months.

## 2015-10-15 NOTE — Patient Instructions (Addendum)
i have sent a prescription to your pharmacy, to add another medication for blood pressure.  Please come back for a blood pressure recheck in 1 month, and: come back for a follow-up appointment in 6 months.

## 2015-10-16 ENCOUNTER — Encounter: Payer: Self-pay | Admitting: Endocrinology

## 2015-10-16 DIAGNOSIS — I10 Essential (primary) hypertension: Secondary | ICD-10-CM | POA: Insufficient documentation

## 2015-10-19 DIAGNOSIS — C61 Malignant neoplasm of prostate: Secondary | ICD-10-CM | POA: Diagnosis not present

## 2015-10-25 DIAGNOSIS — R05 Cough: Secondary | ICD-10-CM | POA: Diagnosis not present

## 2015-10-26 DIAGNOSIS — N3281 Overactive bladder: Secondary | ICD-10-CM | POA: Diagnosis not present

## 2015-10-26 DIAGNOSIS — Z0000 Encounter for general adult medical examination without abnormal findings: Secondary | ICD-10-CM | POA: Diagnosis not present

## 2015-10-26 DIAGNOSIS — C61 Malignant neoplasm of prostate: Secondary | ICD-10-CM | POA: Diagnosis not present

## 2015-11-19 ENCOUNTER — Other Ambulatory Visit: Payer: Self-pay | Admitting: Orthopedic Surgery

## 2015-11-20 DIAGNOSIS — F339 Major depressive disorder, recurrent, unspecified: Secondary | ICD-10-CM | POA: Diagnosis not present

## 2015-11-27 ENCOUNTER — Encounter: Payer: Self-pay | Admitting: Endocrinology

## 2015-11-27 ENCOUNTER — Other Ambulatory Visit: Payer: Self-pay | Admitting: Endocrinology

## 2015-11-27 MED ORDER — LOSARTAN POTASSIUM 100 MG PO TABS: 100.0000 mg | ORAL_TABLET | Freq: Every day | ORAL | Status: DC

## 2015-11-30 NOTE — Telephone Encounter (Signed)
Patient need a medical clearance before he have his knee replacement. June 23

## 2015-11-30 NOTE — Telephone Encounter (Signed)
See note below

## 2015-12-10 ENCOUNTER — Encounter: Payer: Self-pay | Admitting: Endocrinology

## 2015-12-10 ENCOUNTER — Ambulatory Visit (INDEPENDENT_AMBULATORY_CARE_PROVIDER_SITE_OTHER): Payer: Medicare Other | Admitting: Endocrinology

## 2015-12-10 VITALS — BP 125/70 | HR 100 | Temp 98.3°F | Wt 185.0 lb

## 2015-12-10 DIAGNOSIS — I10 Essential (primary) hypertension: Secondary | ICD-10-CM

## 2015-12-10 NOTE — Patient Instructions (Addendum)
You are good to go for the surgery. Please come back for a "medicare wellness" appointment in 4 months

## 2015-12-10 NOTE — Progress Notes (Signed)
Subjective:    Patient ID: Bryan Tyler, male    DOB: 1945/12/22, 70 y.o.   MRN: [id number]  HPI  Pt will have arthroscopic knee surgery soon.  He is here for preop clearance.  Denies chest pain and sob.   Past Medical History  Diagnosis Date  . Hyperlipidemia   . Arthritis   . Adenocarcinoma of prostate ([place]) 04/16/2013  . GERD (gastroesophageal reflux disease)   . Hypothyroidism, postradioiodine therapy     1980's  . Anxiety   . Osteoporosis   . Depression   . Organic impotence   . History of hyperthyroidism   . Nocturia   . Urge urinary incontinence   . OSA (obstructive sleep apnea)     NO CPAP--  S/P SURGERY  2002  . Wears contact lenses   . Diverticulosis     Past Surgical History  Procedure Laterality Date  . Rotator cuff repair Right 2004  . Prostate biopsy    . Hemorrhoidectomy with hemorrhoid banding  2007  . Uvulopalatopharyngoplasty  2002    w/ septoplasty and rhinoplasty  . Tonsillectomy  AS CHILD  . Knee arthroscopy Right 2012  . Inguinal hernia repair Bilateral     x4  (2 each side)  . Radioactive seed implant N/A 05/30/2013    Procedure: RADIOACTIVE SEED IMPLANT;  Surgeon: Hanley Ben, MD;  Location: [facility];  Service: Urology;  Laterality: N/A;    Social History   Social History  . Marital Status: Married    Spouse Name: N/A  . Number of Children: N/A  . Years of Education: N/A   Occupational History  . Middle School Teacher    Social History Main Topics  . Smoking status: Former Smoker -- 1.00 packs/day for 20 years    Types: Cigarettes    Quit date: 08/11/1988  . Smokeless tobacco: Current User  . Alcohol Use: No     Comment: occasionally  . Drug Use: No  . Sexual Activity: Not on file   Other Topics Concern  . Not on file   Social History Narrative    Current Outpatient Prescriptions on File Prior to Visit  Medication Sig Dispense Refill  . amLODipine (NORVASC) 2.5 MG tablet Take 1 tablet (2.5 mg  total) by mouth daily. 90 tablet 3  . aspirin 81 MG tablet Take 81 mg by mouth daily.    Marland Kitchen atorvastatin (LIPITOR) 40 MG tablet TAKE 1 TABLET (40 MG TOTAL) BY MOUTH EVERY EVENING. 90 tablet 3  . levothyroxine (SYNTHROID, LEVOTHROID) 112 MCG tablet TAKE 1 TABLET (112 MCG TOTAL) BY MOUTH DAILY. TAKES IN AM 90 tablet 3  . losartan (COZAAR) 100 MG tablet Take 1 tablet (100 mg total) by mouth daily. 90 tablet 3  . mesalamine (CANASA) 1000 MG suppository Place 1 suppository (1,000 mg total) rectally at bedtime. 30 suppository 1  . omeprazole (PRILOSEC) 40 MG capsule TAKE 1 CAPSULE (40 MG TOTAL) BY MOUTH EVERY MORNING. 90 capsule 3  . traZODone (DESYREL) 100 MG tablet Take 100 mg by mouth at bedtime as needed for sleep. Reported on 10/15/2015     No current facility-administered medications on file prior to visit.    Allergies  Allergen Reactions  . Codeine Nausea And Vomiting and Other (See Comments)    DIZZINESS    Family History  Problem Relation Age of Onset  . Cancer Mother     Pancreatic Cancer  . Heart disease Father 53    CABG  BP 125/70 mmHg  Pulse 100  Temp(Src) 98.3 F (36.8 C)  Wt 185 lb (83.915 kg)  SpO2 98%  Review of Systems Denies dizziness and LOC. Denies fever.      Objective:   Physical Exam VITAL SIGNS:  See vs page. GENERAL: no distress.  LUNGS:  Clear to auscultation.  HEART:  Regular rate and rhythm without murmurs noted. Normal S1,S2.     i personally reviewed electrocardiogram tracing (today): Indication: preop Impression:  Poss old IMI--no change    Assessment & Plan:  HTN: well-controlled Abnormal ECG.  Asymptomatic and unchanged  his surgical risk is low and outweighed by the potential benefit of the surgery.  he is therefore medically cleared  Patient is advised the following: Patient Instructions  You are good to go for the surgery. Please come back for a "medicare wellness" appointment in 4 months

## 2015-12-18 DIAGNOSIS — M1711 Unilateral primary osteoarthritis, right knee: Secondary | ICD-10-CM | POA: Diagnosis not present

## 2015-12-22 ENCOUNTER — Emergency Department (HOSPITAL_COMMUNITY): Payer: Medicare Other

## 2015-12-22 ENCOUNTER — Encounter (HOSPITAL_COMMUNITY): Payer: Self-pay | Admitting: *Deleted

## 2015-12-22 ENCOUNTER — Emergency Department (HOSPITAL_COMMUNITY)
Admission: EM | Admit: 2015-12-22 | Discharge: 2015-12-23 | Disposition: A | Discharge status: Home / Self Care | Payer: Medicare Other | Attending: Emergency Medicine | Admitting: Emergency Medicine

## 2015-12-22 DIAGNOSIS — S4992XA Unspecified injury of left shoulder and upper arm, initial encounter: Secondary | ICD-10-CM | POA: Insufficient documentation

## 2015-12-22 DIAGNOSIS — I712 Thoracic aortic aneurysm, without rupture: Secondary | ICD-10-CM | POA: Diagnosis not present

## 2015-12-22 DIAGNOSIS — Y998 Other external cause status: Secondary | ICD-10-CM | POA: Insufficient documentation

## 2015-12-22 DIAGNOSIS — Y9289 Other specified places as the place of occurrence of the external cause: Secondary | ICD-10-CM | POA: Insufficient documentation

## 2015-12-22 DIAGNOSIS — I7121 Aneurysm of the ascending aorta, without rupture: Secondary | ICD-10-CM

## 2015-12-22 DIAGNOSIS — M1990 Unspecified osteoarthritis, unspecified site: Secondary | ICD-10-CM | POA: Insufficient documentation

## 2015-12-22 DIAGNOSIS — K219 Gastro-esophageal reflux disease without esophagitis: Secondary | ICD-10-CM | POA: Diagnosis not present

## 2015-12-22 DIAGNOSIS — M542 Cervicalgia: Secondary | ICD-10-CM | POA: Diagnosis not present

## 2015-12-22 DIAGNOSIS — F419 Anxiety disorder, unspecified: Secondary | ICD-10-CM | POA: Diagnosis not present

## 2015-12-22 DIAGNOSIS — E785 Hyperlipidemia, unspecified: Secondary | ICD-10-CM | POA: Diagnosis not present

## 2015-12-22 DIAGNOSIS — E039 Hypothyroidism, unspecified: Secondary | ICD-10-CM | POA: Diagnosis not present

## 2015-12-22 DIAGNOSIS — S29001A Unspecified injury of muscle and tendon of front wall of thorax, initial encounter: Secondary | ICD-10-CM | POA: Diagnosis present

## 2015-12-22 DIAGNOSIS — Y9389 Activity, other specified: Secondary | ICD-10-CM | POA: Insufficient documentation

## 2015-12-22 DIAGNOSIS — S3991XA Unspecified injury of abdomen, initial encounter: Secondary | ICD-10-CM | POA: Diagnosis not present

## 2015-12-22 DIAGNOSIS — S29002A Unspecified injury of muscle and tendon of back wall of thorax, initial encounter: Secondary | ICD-10-CM | POA: Diagnosis not present

## 2015-12-22 DIAGNOSIS — Z87438 Personal history of other diseases of male genital organs: Secondary | ICD-10-CM | POA: Insufficient documentation

## 2015-12-22 DIAGNOSIS — Z87891 Personal history of nicotine dependence: Secondary | ICD-10-CM | POA: Diagnosis not present

## 2015-12-22 DIAGNOSIS — Z7982 Long term (current) use of aspirin: Secondary | ICD-10-CM | POA: Insufficient documentation

## 2015-12-22 DIAGNOSIS — S2242XA Multiple fractures of ribs, left side, initial encounter for closed fracture: Secondary | ICD-10-CM | POA: Diagnosis not present

## 2015-12-22 DIAGNOSIS — Z8546 Personal history of malignant neoplasm of prostate: Secondary | ICD-10-CM | POA: Insufficient documentation

## 2015-12-22 DIAGNOSIS — M25512 Pain in left shoulder: Secondary | ICD-10-CM | POA: Diagnosis not present

## 2015-12-22 DIAGNOSIS — F329 Major depressive disorder, single episode, unspecified: Secondary | ICD-10-CM | POA: Diagnosis not present

## 2015-12-22 DIAGNOSIS — S3992XA Unspecified injury of lower back, initial encounter: Secondary | ICD-10-CM | POA: Diagnosis not present

## 2015-12-22 DIAGNOSIS — W1789XA Other fall from one level to another, initial encounter: Secondary | ICD-10-CM | POA: Diagnosis not present

## 2015-12-22 DIAGNOSIS — Z79899 Other long term (current) drug therapy: Secondary | ICD-10-CM | POA: Diagnosis not present

## 2015-12-22 DIAGNOSIS — M25551 Pain in right hip: Secondary | ICD-10-CM | POA: Diagnosis not present

## 2015-12-22 DIAGNOSIS — S2232XA Fracture of one rib, left side, initial encounter for closed fracture: Secondary | ICD-10-CM

## 2015-12-22 DIAGNOSIS — S79911A Unspecified injury of right hip, initial encounter: Secondary | ICD-10-CM | POA: Diagnosis not present

## 2015-12-22 LAB — I-STAT CHEM 8, ED
BUN: 21 mg/dL — ABNORMAL HIGH (ref 6–20)
CALCIUM ION: 1.19 mmol/L (ref 1.13–1.30)
CHLORIDE: 100 mmol/L — AB (ref 101–111)
CREATININE: 0.9 mg/dL (ref 0.61–1.24)
GLUCOSE: 92 mg/dL (ref 65–99)
HCT: 49 % (ref 39.0–52.0)
Hemoglobin: 16.7 g/dL (ref 13.0–17.0)
Potassium: 3.8 mmol/L (ref 3.5–5.1)
Sodium: 140 mmol/L (ref 135–145)
TCO2: 28 mmol/L (ref 0–100)

## 2015-12-22 LAB — URINALYSIS, ROUTINE W REFLEX MICROSCOPIC
Bilirubin Urine: NEGATIVE
GLUCOSE, UA: NEGATIVE mg/dL
HGB URINE DIPSTICK: NEGATIVE
Ketones, ur: NEGATIVE mg/dL
Leukocytes, UA: NEGATIVE
Nitrite: NEGATIVE
PH: 5 (ref 5.0–8.0)
PROTEIN: NEGATIVE mg/dL
Specific Gravity, Urine: 1.013 (ref 1.005–1.030)

## 2015-12-22 MED ORDER — IOPAMIDOL (ISOVUE-300) INJECTION 61%
100.0000 mL | Freq: Once | INTRAVENOUS | Status: AC | PRN
  Administered 2015-12-22: 100 mL via INTRAVENOUS

## 2015-12-22 MED ORDER — HYDROMORPHONE HCL 1 MG/ML IJ SOLN
0.5000 mg | Freq: Once | INTRAMUSCULAR | Status: AC
  Administered 2015-12-22: 0.5 mg via INTRAVENOUS
  Filled 2015-12-22: qty 1

## 2015-12-22 MED ORDER — MORPHINE SULFATE (PF) 4 MG/ML IV SOLN
4.0000 mg | Freq: Once | INTRAVENOUS | Status: AC
  Administered 2015-12-22: 4 mg via INTRAVENOUS
  Filled 2015-12-22: qty 1

## 2015-12-22 MED ORDER — ONDANSETRON HCL 4 MG/2ML IJ SOLN
4.0000 mg | Freq: Once | INTRAMUSCULAR | Status: AC
  Administered 2015-12-22: 4 mg via INTRAVENOUS
  Filled 2015-12-22: qty 2

## 2015-12-22 NOTE — ED Provider Notes (Signed)
CSN: [id number]     Arrival date & time 12/22/15  1934 History   First MD Initiated Contact with Patient 12/22/15 2008     Chief Complaint  Patient presents with  . Fall     (Consider location/radiation/quality/duration/timing/severity/associated sxs/prior Treatment) HPI  Blood pressure 140/93, pulse 78, temperature 97.5 F (36.4 C), temperature source Oral, resp. rate 18, SpO2 98 %.  Bryan Tyler is a 70 y.o. male complaining of severe pain to right groin and left shoulder with decreased range of motion and paresthesia in the hand status post fall 5-1/2 feet onto the ground, there was no head trauma, LOC, anticoagulation, chest pain, shortness of breath, abdominal pain, nausea vomiting, patient has been ambulatory since the event. He has a history of right inguinal surgery repair  Past Medical History  Diagnosis Date  . Hyperlipidemia   . Arthritis   . Adenocarcinoma of prostate ([place]) 04/16/2013  . GERD (gastroesophageal reflux disease)   . Hypothyroidism, postradioiodine therapy     1980's  . Anxiety   . Osteoporosis   . Depression   . Organic impotence   . History of hyperthyroidism   . Nocturia   . Urge urinary incontinence   . OSA (obstructive sleep apnea)     NO CPAP--  S/P SURGERY  2002  . Wears contact lenses   . Diverticulosis    Past Surgical History  Procedure Laterality Date  . Rotator cuff repair Right 2004  . Prostate biopsy    . Hemorrhoidectomy with hemorrhoid banding  2007  . Uvulopalatopharyngoplasty  2002    w/ septoplasty and rhinoplasty  . Tonsillectomy  AS CHILD  . Knee arthroscopy Right 2012  . Inguinal hernia repair Bilateral     x4  (2 each side)  . Radioactive seed implant N/A 05/30/2013    Procedure: RADIOACTIVE SEED IMPLANT;  Surgeon: Hanley Ben, MD;  Location: [facility];  Service: Urology;  Laterality: N/A;   Family History  Problem Relation Age of Onset  . Cancer Mother     Pancreatic Cancer  . Heart disease  Father 66    CABG   Social History  Substance Use Topics  . Smoking status: Former Smoker -- 1.00 packs/day for 20 years    Types: Cigarettes    Quit date: 08/11/1988  . Smokeless tobacco: Current User  . Alcohol Use: No     Comment: occasionally    Review of Systems    Allergies  Codeine  Home Medications   Prior to Admission medications   Medication Sig Start Date End Date Taking? Authorizing Provider  amLODipine (NORVASC) 2.5 MG tablet Take 1 tablet (2.5 mg total) by mouth daily. 10/15/15   Renato Shin, MD  aspirin 81 MG tablet Take 81 mg by mouth daily.    Historical Provider, MD  atorvastatin (LIPITOR) 40 MG tablet TAKE 1 TABLET (40 MG TOTAL) BY MOUTH EVERY EVENING. 02/08/15   Renato Shin, MD  levothyroxine (SYNTHROID, LEVOTHROID) 112 MCG tablet TAKE 1 TABLET (112 MCG TOTAL) BY MOUTH DAILY. TAKES IN AM 01/31/15   Renato Shin, MD  losartan (COZAAR) 100 MG tablet Take 1 tablet (100 mg total) by mouth daily. 11/27/15   Renato Shin, MD  mesalamine (CANASA) 1000 MG suppository Place 1 suppository (1,000 mg total) rectally at bedtime. 09/04/14   Ladene Artist, MD  omeprazole (PRILOSEC) 40 MG capsule TAKE 1 CAPSULE (40 MG TOTAL) BY MOUTH EVERY MORNING. 02/08/15   Renato Shin, MD  traZODone (DESYREL) 100 MG  tablet Take 100 mg by mouth at bedtime as needed for sleep. Reported on 10/15/2015    Historical Provider, MD  venlafaxine XR (EFFEXOR-XR) 150 MG 24 hr capsule Take 300 mg by mouth daily with breakfast.    Historical Provider, MD   BP 140/93 mmHg  Pulse 78  Temp(Src) 97.5 F (36.4 C) (Oral)  Resp 18  SpO2 98% Physical Exam  Constitutional: He is oriented to person, place, and time. He appears well-developed and well-nourished. No distress.  HENT:  Head: Normocephalic and atraumatic.  Mouth/Throat: Oropharynx is clear and moist.  Eyes: Conjunctivae and EOM are normal.  Neck:  No midline C-spine  tenderness to palpation or step-offs appreciated. Patient has full range of  motion without pain.  Grip strength, biceps, 5/5 bilaterally;  can differentiate between pinprick and light touch bilaterally.  Triceps 3/5 on left   Cardiovascular: Normal rate.   Pulmonary/Chest: Effort normal and breath sounds normal. No stridor. No respiratory distress. He has no wheezes. He has no rales. He exhibits tenderness.  Abdominal: Soft. Bowel sounds are normal. He exhibits no distension and no mass. There is no tenderness. There is no rebound and no guarding.  Musculoskeletal: Normal range of motion. He exhibits tenderness. He exhibits no edema.       Back:  Left shoulder with no deformity, significantly reduced range of motion in abduction. Radial pulses 2+, there is no tenderness to palpation along the elbow or the snuffbox. Excellent range of motion to fingers.  Neurological: He is alert and oriented to person, place, and time.  Psychiatric: He has a normal mood and affect.  Nursing note and vitals reviewed.   ED Course  Procedures (including critical care time) Labs Review Labs Reviewed - No data to display  Imaging Review No results found. I have personally reviewed and evaluated these images and lab results as part of my medical decision-making.   EKG Interpretation None      MDM   Final diagnoses:  None    Filed Vitals:   12/22/15 2030 12/22/15 2230 12/22/15 2256 12/22/15 2330  BP: 132/92 126/91 171/94 133/96  Pulse: 78 64 62 68  Temp:   97.6 F (36.4 C)   TempSrc:   Oral   Resp:   20   SpO2: 100% 100% 97% 98%    Medications  morphine 4 MG/ML injection 4 mg (4 mg Intravenous Given 12/22/15 2121)  ondansetron (ZOFRAN) injection 4 mg (4 mg Intravenous Given 12/22/15 2121)  iopamidol (ISOVUE-300) 61 % injection 100 mL (100 mLs Intravenous Contrast Given 12/22/15 2145)  HYDROmorphone (DILAUDID) injection 0.5 mg (0.5 mg Intravenous Given 12/22/15 2319)    Bryan Tyler is 70 y.o. male presenting with Right groin, left shoulder and left rib pain  status post fall from 6 feet. There was no head trauma. Patient is not anticoagulated. Patient is unable to abduction the left arm but he is neurovascularly intact, patient has no tenderness to palpation along the C-spine however he fails Nexus criteria secondary to distracting injury. Discussed case with attending physician who is evaluated the patient and agrees given his mechanism CT imaging is appropriate, plain films are negative.  CT reveals 3 rib fractures, of note there is a 4.2 cm ascending aortic aneurysm. Extensive discussion with patient on the meaning of this finding. Patient will be given cardiothoracic surgery follow-up, advised him he will need to check in with his primary care doctor to be made aware of this next week, primary care will help  facilitate navigating the cardiothoracic evaluation. Advised him he needs to maintain good control of his blood pressure, we've had an extensive discussion of return precautions the patient standing. Patient is counseled on fall precautions, return precautions for pneumonia. Of offered to give this patient swelling and he has declined, concerned about the lack of range of motion on the shoulder. Patient will follow with his orthopedist.  Evaluation does not show pathology that would require ongoing emergent intervention or inpatient treatment. Pt is hemodynamically stable and mentating appropriately. Discussed findings and plan with patient/guardian, who agrees with care plan. All questions answered. Return precautions discussed and outpatient follow up given.   Discharge Medication List as of 12/23/2015 12:41 AM    START taking these medications   Details  ondansetron (ZOFRAN) 4 MG tablet Take 1 tablet (4 mg total) by mouth every 8 (eight) hours as needed for nausea or vomiting., Starting 12/23/2015, Until Discontinued, Print    oxyCODONE-acetaminophen (PERCOCET/ROXICET) 5-325 MG tablet 1 to 2 tabs PO q6hrs  PRN for pain, Print    senna-docusate  (SENOKOT-S) 8.6-50 MG tablet Take 1 tablet by mouth 2 (two) times daily., Starting 12/23/2015, Until Discontinued, Print             Monico Blitz, PA-C 12/23/15 0235  Quintella Reichert, MD 12/23/15 1556

## 2015-12-22 NOTE — ED Notes (Signed)
Pt states that he was standing on a 5.5 ft retaining wall and fell off; pt states that he landed on his left shoulder and side; pt c/o left shoulder, left ribs and left arm pain; pt also c/o rt groin pain; pt denies swelling to groin area but states pain is worse with ambulation; pt denies LOC

## 2015-12-23 DIAGNOSIS — S2242XA Multiple fractures of ribs, left side, initial encounter for closed fracture: Secondary | ICD-10-CM | POA: Diagnosis not present

## 2015-12-23 MED ORDER — ONDANSETRON HCL 4 MG PO TABS: 4.0000 mg | ORAL_TABLET | Freq: Three times a day (TID) | ORAL | Status: DC | PRN

## 2015-12-23 MED ORDER — SENNOSIDES-DOCUSATE SODIUM 8.6-50 MG PO TABS: 1.0000 | ORAL_TABLET | Freq: Two times a day (BID) | ORAL | Status: DC

## 2015-12-23 MED ORDER — OXYCODONE-ACETAMINOPHEN 5-325 MG PO TABS: ORAL_TABLET | ORAL | Status: DC

## 2015-12-23 NOTE — Discharge Instructions (Signed)
Take percocet for breakthrough pain, do not drink alcohol, drive, care for children or do other critical tasks while taking percocet.  Please be very careful n Rib Fracture A rib fracture is a break or crack in one of the bones of the ribs. The ribs are a group of long, curved bones that wrap around your chest and attach to your spine. They protect your lungs and other organs in the chest cavity. A broken or cracked rib is often painful, but most do not cause other problems. Most rib fractures heal on their own over time. However, rib fractures can be more serious if multiple ribs are broken or if broken ribs move out of place and push against other structures. CAUSES   A direct blow to the chest. For example, this could happen during contact sports, a car accident, or a fall against a hard object.  Repetitive movements with high force, such as pitching a baseball or having severe coughing spells. SYMPTOMS   Pain when you breathe in or cough.  Pain when someone presses on the injured area. DIAGNOSIS  Your caregiver will perform a physical exam. Various imaging tests may be ordered to confirm the diagnosis and to look for related injuries. These tests may include a chest X-ray, computed tomography (CT), magnetic resonance imaging (MRI), or a bone scan. TREATMENT  Rib fractures usually heal on their own in 1-3 months. The longer healing period is often associated with a continued cough or other aggravating activities. During the healing period, pain control is very important. Medication is usually given to control pain. Hospitalization or surgery may be needed for more severe injuries, such as those in which multiple ribs are broken or the ribs have moved out of place.  HOME CARE INSTRUCTIONS   Avoid strenuous activity and any activities or movements that cause pain. Be careful during activities and avoid bumping the injured rib.  Gradually increase activity as directed by your  caregiver.  Only take over-the-counter or prescription medications as directed by your caregiver. Do not take other medications without asking your caregiver first.  Apply ice to the injured area for the first 1-2 days after you have been treated or as directed by your caregiver. Applying ice helps to reduce inflammation and pain.  Put ice in a plastic bag.  Place a towel between your skin and the bag.   Leave the ice on for 15-20 minutes at a time, every 2 hours while you are awake.  Perform deep breathing as directed by your caregiver. This will help prevent pneumonia, which is a common complication of a broken rib. Your caregiver may instruct you to:  Take deep breaths several times a day.  Try to cough several times a day, holding a pillow against the injured area.  Use a device called an incentive spirometer to practice deep breathing several times a day.  Drink enough fluids to keep your urine clear or pale yellow. This will help you avoid constipation.   Do not wear a rib belt or binder. These restrict breathing, which can lead to pneumonia.  SEEK IMMEDIATE MEDICAL CARE IF:   You have a fever.   You have difficulty breathing or shortness of breath.   You develop a continual cough, or you cough up thick or bloody sputum.  You feel sick to your stomach (nausea), throw up (vomit), or have abdominal pain.   You have worsening pain not controlled with medications.  MAKE SURE YOU:  Understand these instructions.  Will watch your condition.  Will get help right away if you are not doing well or get worse.   This information is not intended to replace advice given to you by your health care provider. Make sure you discuss any questions you have with your health care provider.   Document Released: 07/28/2005 Document Revised: 03/30/2013 Document Reviewed: 09/29/2012 Elsevier Interactive Patient Education [employer]. ot to fall! The pain medication puts you  at risk for falls. Please rest as much as possible and try to not stay alone.   See your primary care doctor in the next week, please make an appointment with the cardiothoracic surgeon for evaluation.  It is very important that you take deep breaths to prevent lung collapse and infection.  Either use your incentive spirometer or take 10 deep breaths every hour to prevent lung collapse.  If you develop cough, fever or shortness of breath return immediately to the emergency room.    Thoracic Aortic Aneurysm An aneurysm is a bulge in an artery. It happens when the wall of the artery is weakened or damaged. If the aneurysm gets too big, it bursts (ruptures) and severe bleeding occurs. A thoracic aortic aneurysm is an aneurysm that occurs in the first part of the aorta, between the heart and the diaphragm. The aorta is the main artery and supplies blood from the heart to the rest of the body. A thoracic aortic aneurysm can enlarge and rupture or blood can flow between the layers of the wall of the aorta through a tear (aorticdissection). Both of these conditions can cause bleeding inside the body and can be life threatening unless diagnosed and treated promptly. CAUSES  The exact cause of a thoracic aortic aneurysm is often unknown. Some contributing factors are:   A hardening of the arteries caused by the buildup of fat and other substances in the lining of a blood vessel (arteriosclerosis).  Inflammation of the walls of an artery (arteritis).  Connective tissue diseases, such as Marfan syndrome.  Injury or trauma to the aorta.  An infection, such as syphilis or staphylococcus, in the wall of the aorta (infectious aortitis) caused by bacteria. RISK FACTORS  Risk factors that contribute to a thoracic aortic aneurysm may include:  Age older than 45 years.  High blood pressure (hypertension).  Male gender.  Ethnicity (white race).  Obesity.  Family history of aneurysm (first degree  relatives only).  Tobacco use. PREVENTION  The following healthy lifestyle habits may help decrease your risk of a thoracic aortic aneurysm:  Quitting smoking. Smoking can raise your blood pressure and cause arteriosclerosis.  Limiting or avoiding alcohol.  Keeping your blood pressure, blood sugar level, and cholesterol levels within normal limits.  Decreasing your salt intake. In some people, too much salt can raise blood pressure and increase your risk of abdominal aortic aneurysm.  Eating a diet low in saturated fats and cholesterol.  Increasing your fiber intake by including whole grains, vegetables, and fruits in your diet. Eating these foods may help lower blood pressure.  Maintaining a healthy weight.  Staying physically active and exercising regularly. SYMPTOMS  The symptoms of thoracic aortic aneurysm may vary depending on the size and rate of growth of the aneurysm. Most grow slowly and do not have any symptoms. When symptoms do occur, they may include:  Pain (chest, back, sides, or abdomen). The pain may vary in intensity. A sudden onset of severe pain may indicate that the aneurysm has ruptured.  Hoarseness.  Cough.  Shortness of breath.  Swallowing problems.  Nausea or vomiting or both. DIAGNOSIS  Since most unruptured thoracic aortic aneurysms have no symptoms, they are often discovered during diagnostic exams for other conditions. An aneurysm may be found during the following procedures:  Ultrasonography (a one-time screening for thoracic aortic aneurysm by ultrasonography is also recommended for all men aged 46-75 years who have ever smoked).  X-ray exams.  A CT scan.  An MRI.  Angiography or arteriography. TREATMENT  Treatment of a thoracic aortic aneurysm depends on the size of your aneurysm, your age, and risk factors for rupture. Medicine to control blood pressure and pain may be used to manage aneurysms smaller than 2.3 in (6 cm). Regular  monitoring for enlargement may be recommended by your health care provider if:  The aneurysm is 1.2-1.5 in (3-4 cm) in size (an annual ultrasonography may be recommended).  The aneurysm is 1.5-1.8 in (4-4.5 cm) in size (an ultrasonography every 6 months may be recommended).  The aneurysm is larger than 1.8 in (4.5 cm) in size (your health care provider may ask that you be examined by a vascular surgeon). If your aneurysm is larger than 2.2 in (5.5 cm) or if it is enlarging quickly, surgical repair may be recommended. There are two main methods for repair of an aneurysm:   Endovascular repair (a minimally invasive surgery).  Open repair. This method is used if an endovascular repair is not possible.   This information is not intended to replace advice given to you by your health care provider. Make sure you discuss any questions you have with your health care provider.   Document Released: 07/28/2005 Document Revised: 05/18/2013 Document Reviewed: 02/07/2013 Elsevier Interactive Patient Education [employer].

## 2015-12-25 ENCOUNTER — Encounter: Payer: Self-pay | Admitting: Cardiothoracic Surgery

## 2015-12-25 ENCOUNTER — Institutional Professional Consult (permissible substitution) (INDEPENDENT_AMBULATORY_CARE_PROVIDER_SITE_OTHER): Payer: Medicare Other | Admitting: Cardiothoracic Surgery

## 2015-12-25 VITALS — BP 136/87 | HR 86 | Resp 20 | Ht 70.0 in | Wt 185.0 lb

## 2015-12-25 DIAGNOSIS — I712 Thoracic aortic aneurysm, without rupture: Secondary | ICD-10-CM

## 2015-12-25 DIAGNOSIS — I7121 Aneurysm of the ascending aorta, without rupture: Secondary | ICD-10-CM

## 2015-12-25 NOTE — Patient Instructions (Signed)
CT scan IMPRESSION: Fractures of the left posterior 9-11th ribs. No pneumothorax. No other acute/traumatic intrathoracic, abdominal, or pelvic pathology identified.  A 4.3 cm dilatation of the ascending aorta. Recommend annual imaging followup by CTA or MRA. This recommendation follows 2010 ACCF/AHA/AATS/ACR/ASA/SCA/SCAI/SIR/STS/SVM Guidelines for the Diagnosis and Management of Patients with Thoracic Aortic Disease. Circulation. 2010; [id number]   Electronically Signed  By: Anner Crete M.D.  On: 12/22/2015 22:39   It's best to avoid activities that cause grunting or straining (medically referred to as a "valsalva maneuver"). This happens when a person bears down against a closed throat to increase the strength of arm or abdominal muscles. There's often a tendency to do this when lifting heavy weights, doing sit-ups, push-ups or chin-ups, etc., but it may be harmful.

## 2015-12-25 NOTE — Progress Notes (Signed)
[address]       [place] [address]             [phone]                    Asael E Lacson Willis Medical Record [id number] Date of Birth: February 25, 1946  Referring: Lacretia Leigh, MD Primary Care: Renato Shin, MD  Chief Complaint:    Chief Complaint  Patient presents with  . Thoracic Aortic Aneurysm    Surgical eval on  4.3 cm dilatation of the ascending aorta, C/A/P CT 12/22/15    History of Present Illness:    Bryan Tyler 70 y.o. male is seen in the office  today for Incidental finding of a 4.2 cm ascending aorta. The patient was working around his house and fell off a 5 foot wall 3 days ago and suffered fractured ribs on the left lower chest he was seen at the emergency room at [facility]. CT scan of the chest was done the only previous CT scan the patient had was in 2013 in the urology office the report does not note the size of his aorta. The films are not available to compare. The patient has no previous cardiac history. He denies chest discomfort with exception of the current pain in his left lower ribs, he denies exertional chest pain. He has never had an echocardiogram. He does have hypertension for which he takes medication.   On close questioning the patient has no family history of aortic dissection aortic aneurysms or sudden death at a premature age from unexplained causes.   He is planning to have a knee replacement right knee in the coming months because of arthritic changes.  Current Activity/ Functional Status:  Patient is independent with mobility/ambulation, transfers, ADL's, IADL's.   Zubrod Score: At the time of surgery this patient's most appropriate activity status/level should be described as: [x]     0    Normal activity, no symptoms []     1    Restricted in physical strenuous activity but ambulatory, able to do out light work []     2    Ambulatory and capable of self care, unable to do work activities, up and about                >50 % of waking hours                              []     3    Only limited self care, in bed greater than 50% of waking hours []     4    Completely disabled, no self care, confined to bed or chair []     5    Moribund   Past Medical History  Diagnosis Date  . Hyperlipidemia   . Arthritis   . Adenocarcinoma of prostate ([place]) 04/16/2013  . GERD (gastroesophageal reflux disease)   . Hypothyroidism, postradioiodine therapy     1980's  . Anxiety   . Osteoporosis   . Depression   . Organic impotence   . History of hyperthyroidism   . Nocturia   . Urge urinary incontinence   . OSA (obstructive sleep apnea)     NO CPAP--  S/P SURGERY  2002  . Wears contact lenses   . Diverticulosis     Past Surgical History  Procedure Laterality Date  . Rotator cuff repair Right  2004  . Prostate biopsy    . Hemorrhoidectomy with hemorrhoid banding  2007  . Uvulopalatopharyngoplasty  2002    w/ septoplasty and rhinoplasty  . Tonsillectomy  AS CHILD  . Knee arthroscopy Right 2012  . Inguinal hernia repair Bilateral     x4  (2 each side)  . Radioactive seed implant N/A 05/30/2013    Procedure: RADIOACTIVE SEED IMPLANT;  Surgeon: Hanley Ben, MD;  Location: [facility];  Service: Urology;  Laterality: N/A;    Family History  Problem Relation Age of Onset  . Cancer Mother     Pancreatic Cancer  . Heart disease Father 3    CABG    Social History   Social History  . Marital Status: Married    Spouse Name: N/A  . Number of Children: N/A  . Years of Education: N/A   Occupational History  . Middle School Teacher    Social History Main Topics  . Smoking status: Former Smoker -- 1.00 packs/day for 20 years    Types: Cigarettes    Quit date: 08/11/1988  . Smokeless tobacco: Current User  . Alcohol Use: No     Comment: occasionally  . Drug Use: No  . Sexual Activity: Not on file   Other Topics Concern  . Not on file   Social History Narrative     History  Smoking status  . Former Smoker -- 1.00 packs/day for 20 years  . Types: Cigarettes  . Quit date: 08/11/1988  Smokeless tobacco  . Current User    History  Alcohol Use No    Comment: occasionally     Allergies  Allergen Reactions  . Codeine Nausea And Vomiting and Other (See Comments)    DIZZINESS    Current Outpatient Prescriptions  Medication Sig Dispense Refill  . amLODipine (NORVASC) 2.5 MG tablet Take 1 tablet (2.5 mg total) by mouth daily. 90 tablet 3  . aspirin 81 MG tablet Take 81 mg by mouth daily.    Marland Kitchen atorvastatin (LIPITOR) 40 MG tablet TAKE 1 TABLET (40 MG TOTAL) BY MOUTH EVERY EVENING. 90 tablet 3  . clonazePAM (KLONOPIN) 1 MG tablet Take 1 tablet by mouth 2 (two) times daily.  2  . gabapentin (NEURONTIN) 400 MG capsule Take 400 mg by mouth at bedtime.  2  . levothyroxine (SYNTHROID, LEVOTHROID) 112 MCG tablet TAKE 1 TABLET (112 MCG TOTAL) BY MOUTH DAILY. TAKES IN AM 90 tablet 3  . losartan (COZAAR) 100 MG tablet Take 1 tablet (100 mg total) by mouth daily. 90 tablet 3  . mesalamine (CANASA) 1000 MG suppository Place 1 suppository (1,000 mg total) rectally at bedtime. (Patient taking differently: Place 1,000 mg rectally daily as needed (bleeding.). ) 30 suppository 1  . omeprazole (PRILOSEC) 40 MG capsule TAKE 1 CAPSULE (40 MG TOTAL) BY MOUTH EVERY MORNING. (Patient taking differently: TAKE 1 CAPSULE (40 MG TOTAL) BY MOUTH DAILY AS NEEDED.) 90 capsule 3  . ondansetron (ZOFRAN) 4 MG tablet Take 1 tablet (4 mg total) by mouth every 8 (eight) hours as needed for nausea or vomiting. 10 tablet 0  . oxyCODONE-acetaminophen (PERCOCET/ROXICET) 5-325 MG tablet 1 to 2 tabs PO q6hrs  PRN for pain 15 tablet 0  . senna-docusate (SENOKOT-S) 8.6-50 MG tablet Take 1 tablet by mouth 2 (two) times daily. 21 tablet 0  . traZODone (DESYREL) 100 MG tablet Take 50 mg by mouth at bedtime as needed for sleep. Reported on 10/15/2015    . venlafaxine XR (EFFEXOR-XR)  150 MG 24 hr  capsule Take 300 mg by mouth daily with breakfast.     No current facility-administered medications for this visit.      Review of Systems:     Cardiac Review of Systems: Y or N  Chest Pain [ n   ]  Resting SOB [ n  ] Exertional SOB  [n  ]  Orthopnea [ n ]   Pedal Edema [ n  ]    Palpitations [n  ] Syncope  [n  ]   Presyncope [  n ]  General Review of Systems: [Y] = yes [  ]=no Constitional: recent weight change [  ];  Wt loss over the last 3 months [   ] anorexia [  ]; fatigue [  ]; nausea [  ]; night sweats [  ]; fever [  ]; or chills [  ];          Dental: poor dentition[  ]; Last Dentist visit:   Eye : blurred vision [  ]; diplopia [   ]; vision changes [  ];  Amaurosis fugax[  ]; Resp: cough [  ];  wheezing[  ];  hemoptysis[  ]; shortness of breath[  ]; paroxysmal nocturnal dyspnea[  ]; dyspnea on exertion[  ]; or orthopnea[  ];  GI:  gallstones[  ], vomiting[  ];  dysphagia[  ]; melena[  ];  hematochezia [  ]; heartburn[  ];   Hx of  Colonoscopy[ y ]; GU: kidney stones [  ]; hematuria[  ];   dysuria [  ];  nocturia[  ];  history of     obstruction [  ]; urinary frequency Blue.[email or web address]  ]             Skin: rash, swelling[  ];, hair loss[  ];  peripheral edema[  ];  or itching[  ]; Musculosketetal: myalgias[  ];  joint swelling[  ];  joint erythema[  ];  joint pain[ y ];  back pain[ n ];  Heme/Lymph: bruising[  ];  bleeding[  ];  anemia[  ];  Neuro: TIA[  ];  headaches[  ];  stroke[  ];  vertigo[  ];  seizures[  ];   paresthesias[  ];  difficulty walking[  ];  Psych:depression[y  ]; anxiety[ y ];  Endocrine: diabetes[  ];  thyroid dysfunction[  ];  Immunizations: Flu up to date Blue.Reese  ]; Pneumococcal up to date [ y ];  Other:  Physical Exam: BP 136/87 mmHg  Pulse 86  Resp 20  Ht 5\' 10"  (1.778 m)  Wt 83.915 kg (185 lb)  BMI 26.54 kg/m2  SpO2 95%  PHYSICAL EXAMINATION: General appearance: alert, cooperative, appears stated age and no distress Head: Normocephalic, without obvious  abnormality, atraumatic Neck: no adenopathy, no carotid bruit, no JVD, supple, symmetrical, trachea midline and thyroid not enlarged, symmetric, no tenderness/mass/nodules Lymph nodes: Cervical, supraclavicular, and axillary nodes normal. Resp: clear to auscultation bilaterally Back: symmetric, no curvature. ROM normal. No CVA tenderness. Cardio: regular rate and rhythm, S1, S2 normal, no murmur, click, rub or gallop GI: soft, non-tender; bowel sounds normal; no masses,  no organomegaly Extremities: extremities normal, atraumatic, no cyanosis or edema and Homans sign is negative, no sign of DVT Neurologic: Grossly normal   patient has full DP PT femoral brachial and radial pulses bilaterally He has no stigmata of Marfan's   Diagnostic Studies & Laboratory data:     Recent Radiology Findings:   Ct  Chest W Contrast  12/22/2015  CLINICAL DATA:  70 year old male with fall EXAM: CT CHEST, ABDOMEN, AND PELVIS WITH CONTRAST TECHNIQUE: Multidetector CT imaging of the chest, abdomen and pelvis was performed following the standard protocol during bolus administration of intravenous contrast. CONTRAST:  166mL ISOVUE-300 IOPAMIDOL (ISOVUE-300) INJECTION 61% COMPARISON:  Abdominal MRI dated 03/02/2011 FINDINGS: CT CHEST There is mild diffuse interstitial coarsening with areas of atelectasis/ scarring at the lung bases. There is no focal consolidation, pleural effusion, or pneumothorax. An accessory azygos fissure noted. The central airways are patent. There is mild dilatation of the ascending aorta measuring 4.3 cm in diameter. The thoracic aorta is otherwise unremarkable. No dissection or traumatic injury. The origins of the great vessels of the aortic arch appear patent. The central pulmonary arteries appear patent. There is no cardiomegaly or pericardial effusion. There is coronary vascular calcification. There is no hilar or mediastinal adenopathy. The esophagus is grossly unremarkable. The thyroid nodule is  not visualized. There is no axillary adenopathy. The chest wall soft tissues appear unremarkable. There is osteopenia. There is minimally displaced fracture of the left posterior ninth rib. Nondisplaced fractures of the left posterior tenth and eleventh ribs noted. Right shoulder rotator cuff surgical pinning noted. CT ABDOMEN AND PELVIS No intra-abdominal free air or free fluid. There stone within the gallbladder. No pericholecystic fluid. Ultrasound may provide better evaluation of the gallbladder if clinically indicated. The liver, pancreas, spleen, and the adrenal glands appear unremarkable. Multiple left renal hypodense lesions measuring up to 4.2 cm in the upper pole of the left kidney. The larger lesions measure cyst. The smaller lesions are too small to characterize but however seen on the prior MRI and described as cysts. The right kidney is unremarkable. There is no hydronephrosis on either side. The visualized ureters and urinary bladder appear unremarkable. Prostate brachytherapy seeds noted. There is sigmoid diverticulosis without acute inflammatory changes. Constipation. There is no evidence of bowel obstruction or active inflammation. Normal appendix. The abdominal aorta and IVC appear unremarkable. No portal venous gas identified. There is no adenopathy. The abdominal wall soft tissues appear unremarkable. There is degenerative changes of the spine. Old healed left pubic bone fracture deformity. No acute fracture. IMPRESSION: Fractures of the left posterior 9-11th ribs. No pneumothorax. No other acute/traumatic intrathoracic, abdominal, or pelvic pathology identified. A 4.3 cm dilatation of the ascending aorta. Recommend annual imaging followup by CTA or MRA. This recommendation follows 2010 ACCF/AHA/AATS/ACR/ASA/SCA/SCAI/SIR/STS/SVM Guidelines for the Diagnosis and Management of Patients with Thoracic Aortic Disease. Circulation. 2010; [id number] Electronically Signed   By: Anner Crete M.D.    On: 12/22/2015 22:39   I have independently reviewed the above radiology studies  and reviewed the findings with the patient.  Ct Cervical Spine Wo Contrast  12/22/2015  CLINICAL DATA:  Fall today.  Neck Pain EXAM: CT CERVICAL SPINE WITHOUT CONTRAST TECHNIQUE: Multidetector CT imaging of the cervical spine was performed without intravenous contrast. Multiplanar CT image reconstructions were also generated. COMPARISON:  None. FINDINGS: Disc degeneration and spondylosis C4-5 and C5-6. 2 mm anterior slip C7-T1 due to advanced facet degeneration on the left. Right-sided facet degeneration C2-3 and C3-4. Negative for cervical spine fracture. No mass or adenopathy in the neck. Lung apices clear. IMPRESSION: Cervical spine degenerative change.  No acute fracture. Electronically Signed   By: Franchot Gallo M.D.   On: 12/22/2015 22:13   Ct Abdomen Pelvis W Contrast  12/22/2015  CLINICAL DATA:  70 year old male with fall EXAM: CT CHEST, ABDOMEN, AND  PELVIS WITH CONTRAST TECHNIQUE: Multidetector CT imaging of the chest, abdomen and pelvis was performed following the standard protocol during bolus administration of intravenous contrast. CONTRAST:  110mL ISOVUE-300 IOPAMIDOL (ISOVUE-300) INJECTION 61% COMPARISON:  Abdominal MRI dated 03/02/2011 FINDINGS: CT CHEST There is mild diffuse interstitial coarsening with areas of atelectasis/ scarring at the lung bases. There is no focal consolidation, pleural effusion, or pneumothorax. An accessory azygos fissure noted. The central airways are patent. There is mild dilatation of the ascending aorta measuring 4.3 cm in diameter. The thoracic aorta is otherwise unremarkable. No dissection or traumatic injury. The origins of the great vessels of the aortic arch appear patent. The central pulmonary arteries appear patent. There is no cardiomegaly or pericardial effusion. There is coronary vascular calcification. There is no hilar or mediastinal adenopathy. The esophagus is  grossly unremarkable. The thyroid nodule is not visualized. There is no axillary adenopathy. The chest wall soft tissues appear unremarkable. There is osteopenia. There is minimally displaced fracture of the left posterior ninth rib. Nondisplaced fractures of the left posterior tenth and eleventh ribs noted. Right shoulder rotator cuff surgical pinning noted. CT ABDOMEN AND PELVIS No intra-abdominal free air or free fluid. There stone within the gallbladder. No pericholecystic fluid. Ultrasound may provide better evaluation of the gallbladder if clinically indicated. The liver, pancreas, spleen, and the adrenal glands appear unremarkable. Multiple left renal hypodense lesions measuring up to 4.2 cm in the upper pole of the left kidney. The larger lesions measure cyst. The smaller lesions are too small to characterize but however seen on the prior MRI and described as cysts. The right kidney is unremarkable. There is no hydronephrosis on either side. The visualized ureters and urinary bladder appear unremarkable. Prostate brachytherapy seeds noted. There is sigmoid diverticulosis without acute inflammatory changes. Constipation. There is no evidence of bowel obstruction or active inflammation. Normal appendix. The abdominal aorta and IVC appear unremarkable. No portal venous gas identified. There is no adenopathy. The abdominal wall soft tissues appear unremarkable. There is degenerative changes of the spine. Old healed left pubic bone fracture deformity. No acute fracture. IMPRESSION: Fractures of the left posterior 9-11th ribs. No pneumothorax. No other acute/traumatic intrathoracic, abdominal, or pelvic pathology identified. A 4.3 cm dilatation of the ascending aorta. Recommend annual imaging followup by CTA or MRA. This recommendation follows 2010 ACCF/AHA/AATS/ACR/ASA/SCA/SCAI/SIR/STS/SVM Guidelines for the Diagnosis and Management of Patients with Thoracic Aortic Disease. Circulation. 2010; [id number]  Electronically Signed   By: Anner Crete M.D.   On: 12/22/2015 22:39     I have independently reviewed the above radiologic studies.  Recent Lab Findings: Lab Results  Component Value Date   WBC 6.5 03/02/2015   HGB 16.7 12/22/2015   HCT 49.0 12/22/2015   PLT 258.0 03/02/2015   GLUCOSE 92 12/22/2015   CHOL 158 03/02/2015   TRIG 283.0* 03/02/2015   HDL 48.00 03/02/2015   LDLDIRECT 84.0 03/02/2015   LDLCALC 100* 02/06/2014   ALT 18 03/02/2015   AST 21 03/02/2015   NA 140 12/22/2015   K 3.8 12/22/2015   CL 100* 12/22/2015   CREATININE 0.90 12/22/2015   BUN 21* 12/22/2015   CO2 31 03/02/2015   TSH 4.02 03/02/2015   INR 0.93 05/24/2013   Aortic Size Index=    4.3      /Body surface area is 2.04 meters squared. = 2.1  < 2.75 cm/m2      4% risk per year 2.75 to 4.25  8% risk per year > 4.25 cm/m2    20% risk per year     Assessment / Plan:    4.3 cm dilatation of the ascending aorta-with unknown anatomy of his aortic valve  Acute Fractures of the left posterior 9-11th ribs  I discussed with the patient and reviewed the CTA of the chest with him and the diagnosis of mildly dilated ascending aorta. He has no murmur of aortic insufficiency , but we do not know the anatomy of his aortic valve bicuspid versus trileaflet. I recommended to him that he continue on good blood pressure control and avoid strenuous lifting/competitive weightlifting. We will obtain an echocardiogram in the next several weeks to discern the anatomy of his aortic valve. We'll obtain a follow-up CTA of the chest in one year to evaluate the size of his aorta. He is aware of the signs and symptoms of aortic dissection. He is currently not on a beta blocker but with good blood pressure control consider in the future the addition of low-dose beta blocker if she needs further blood pressure control    I  spent 40 minutes counseling the patient face to face and 50% or more the  time was spent in  counseling and coordination of care. The total time spent in the appointment was 60 minutes.  Grace Isaac MD      [address] [place] [address] Office [phone]   Beeper [phone]  12/25/2015 6:05 PM

## 2015-12-26 ENCOUNTER — Encounter: Payer: Self-pay | Admitting: *Deleted

## 2016-01-10 DIAGNOSIS — M25512 Pain in left shoulder: Secondary | ICD-10-CM | POA: Diagnosis not present

## 2016-01-10 DIAGNOSIS — S46012A Strain of muscle(s) and tendon(s) of the rotator cuff of left shoulder, initial encounter: Secondary | ICD-10-CM | POA: Diagnosis not present

## 2016-01-16 ENCOUNTER — Other Ambulatory Visit: Payer: Self-pay | Admitting: *Deleted

## 2016-01-16 DIAGNOSIS — Q231 Congenital insufficiency of aortic valve: Secondary | ICD-10-CM

## 2016-01-16 DIAGNOSIS — I77819 Aortic ectasia, unspecified site: Secondary | ICD-10-CM

## 2016-01-16 DIAGNOSIS — S46012D Strain of muscle(s) and tendon(s) of the rotator cuff of left shoulder, subsequent encounter: Secondary | ICD-10-CM | POA: Diagnosis not present

## 2016-01-17 ENCOUNTER — Other Ambulatory Visit: Payer: Self-pay | Admitting: Specialist

## 2016-01-17 DIAGNOSIS — S46012D Strain of muscle(s) and tendon(s) of the rotator cuff of left shoulder, subsequent encounter: Secondary | ICD-10-CM

## 2016-01-18 DIAGNOSIS — S46012D Strain of muscle(s) and tendon(s) of the rotator cuff of left shoulder, subsequent encounter: Secondary | ICD-10-CM | POA: Diagnosis not present

## 2016-01-19 ENCOUNTER — Ambulatory Visit
Admission: RE | Admit: 2016-01-19 | Discharge: 2016-01-19 | Disposition: A | Discharge status: Home / Self Care | Payer: Medicare Other | Source: Ambulatory Visit | Attending: Specialist | Admitting: Specialist

## 2016-01-19 DIAGNOSIS — S46012D Strain of muscle(s) and tendon(s) of the rotator cuff of left shoulder, subsequent encounter: Secondary | ICD-10-CM

## 2016-01-19 DIAGNOSIS — M75112 Incomplete rotator cuff tear or rupture of left shoulder, not specified as traumatic: Secondary | ICD-10-CM | POA: Diagnosis not present

## 2016-01-22 ENCOUNTER — Other Ambulatory Visit (HOSPITAL_COMMUNITY): Payer: Self-pay | Admitting: *Deleted

## 2016-01-22 NOTE — Patient Instructions (Signed)
Bryan Tyler  01/22/2016   Your procedure is scheduled on: 02-01-16  Report to [facility] Main  Entrance take [facility]  elevators to 3rd floor to  [place] at 1100  AM.  Call this number if you have problems the morning of surgery [phone]   Remember: ONLY 1 PERSON MAY GO WITH YOU TO SHORT STAY TO GET  READY MORNING OF [place].  Do not eat food  :After Midnight, may have clear liquids from midnight until 700 am day of surgery, nohting by mouth afre 700 am day of surgery.     Take these medicines the morning of surgery with A SIP OF WATER: AMLODIPINE (NORVASC), CLONAZEPAM (KLOPONIN), LEVOTHYROXINE, (SYNTHROID), OXYCODONE IF NEEDED              You may not have any metal on your body including hair pins and              piercings  Do not wear jewelry, make-up, lotions, powders or perfumes, deodorant             Do not wear nail polish.  Do not shave  48 hours prior to surgery.              Men may shave face and neck.   Do not bring valuables to the hospital. Bryan Tyler.  Contacts, dentures or bridgework may not be worn into surgery.  Leave suitcase in the car. After surgery it may be brought to your room.                  Please read over the following fact sheets you were given: _____________________________________________________________________                CLEAR LIQUID DIET   Foods Allowed                                                                     Foods Excluded  Coffee and tea, regular and decaf                             liquids that you cannot  Plain Jell-O in any flavor                                             see through such as: Fruit ices (not with fruit pulp)                                     milk, soups, orange juice  Iced Popsicles                                    All solid food Carbonated beverages, regular and diet  Cranberry, grape and apple juices Sports drinks like Gatorade Lightly seasoned clear broth or consume(fat free) Sugar, honey syrup  Sample Menu Breakfast                                Lunch                                     Supper Cranberry juice                    Beef broth                            Chicken broth Jell-O                                     Grape juice                           Apple juice Coffee or tea                        Jell-O                                      Popsicle                                                Coffee or tea                        Coffee or tea  _____________________________________________________________________  [facility] - Preparing for Surgery Before surgery, you can play an important role.  Because skin is not sterile, your skin needs to be as free of germs as possible.  You can reduce the number of germs on your skin by washing with CHG (chlorahexidine gluconate) soap before surgery.  CHG is an antiseptic cleaner which kills germs and bonds with the skin to continue killing germs even after washing. Please DO NOT use if you have an allergy to CHG or antibacterial soaps.  If your skin becomes reddened/irritated stop using the CHG and inform your nurse when you arrive at Short Stay. Do not shave (including legs and underarms) for at least 48 hours prior to the first CHG shower.  You may shave your face/neck. Please follow these instructions carefully:  1.  Shower with CHG Soap the night before surgery and the  morning of Surgery.  2.  If you choose to wash your hair, wash your hair first as usual with your  normal  shampoo.  3.  After you shampoo, rinse your hair and body thoroughly to remove the  shampoo.                           4.  Use CHG as you would any other liquid soap.  You can apply chg directly  to the skin and wash  Gently with a scrungie or clean washcloth.  5.  Apply the CHG Soap to your body ONLY  FROM THE NECK DOWN.   Do not use on face/ open                           Wound or open sores. Avoid contact with eyes, ears mouth and genitals (private parts).                       Wash face,  Genitals (private parts) with your normal soap.             6.  Wash thoroughly, paying special attention to the area where your surgery  will be performed.  7.  Thoroughly rinse your body with warm water from the neck down.  8.  DO NOT shower/wash with your normal soap after using and rinsing off  the CHG Soap.                9.  Pat yourself dry with a clean towel.            10.  Wear clean pajamas.            11.  Place clean sheets on your bed the night of your first shower and do not  sleep with pets. Day of Surgery : Do not apply any lotions/deodorants the morning of surgery.  Please wear clean clothes to the hospital/surgery center.  FAILURE TO FOLLOW THESE INSTRUCTIONS MAY RESULT IN THE CANCELLATION OF YOUR SURGERY PATIENT SIGNATURE_________________________________  NURSE SIGNATURE__________________________________  ________________________________________________________________________   Bryan Tyler  An incentive spirometer is a tool that can help keep your lungs clear and active. This tool measures how well you are filling your lungs with each breath. Taking long deep breaths may help reverse or decrease the chance of developing breathing (pulmonary) problems (especially infection) following:  A long period of time when you are unable to move or be active. BEFORE THE PROCEDURE   If the spirometer includes an indicator to show your best effort, your nurse or respiratory therapist will set it to a desired goal.  If possible, sit up straight or lean slightly forward. Try not to slouch.  Hold the incentive spirometer in an upright position. INSTRUCTIONS FOR USE  1. Sit on the edge of your bed if possible, or sit up as far as you can in bed or on a chair. 2. Hold the incentive  spirometer in an upright position. 3. Breathe out normally. 4. Place the mouthpiece in your mouth and seal your lips tightly around it. 5. Breathe in slowly and as deeply as possible, raising the piston or the ball toward the top of the column. 6. Hold your breath for 3-5 seconds or for as long as possible. Allow the piston or ball to fall to the bottom of the column. 7. Remove the mouthpiece from your mouth and breathe out normally. 8. Rest for a few seconds and repeat Steps 1 through 7 at least 10 times every 1-2 hours when you are awake. Take your time and take a few normal breaths between deep breaths. 9. The spirometer may include an indicator to show your best effort. Use the indicator as a goal to work toward during each repetition. 10. After each set of 10 deep breaths, practice coughing to be sure your lungs are clear. If you have an incision (the cut made at the time of  surgery), support your incision when coughing by placing a pillow or rolled up towels firmly against it. Once you are able to get out of bed, walk around indoors and cough well. You may stop using the incentive spirometer when instructed by your caregiver.  RISKS AND COMPLICATIONS  Take your time so you do not get dizzy or light-headed.  If you are in pain, you may need to take or ask for pain medication before doing incentive spirometry. It is harder to take a deep breath if you are having pain. AFTER USE  Rest and breathe slowly and easily.  It can be helpful to keep track of a log of your progress. Your caregiver can provide you with a simple table to help with this. If you are using the spirometer at home, follow these instructions: [place] IF:   You are having difficultly using the spirometer.  You have trouble using the spirometer as often as instructed.  Your pain medication is not giving enough relief while using the spirometer.  You develop fever of 100.5 F (38.1 C) or higher. SEEK  IMMEDIATE MEDICAL CARE IF:   You cough up bloody sputum that had not been present before.  You develop fever of 102 F (38.9 C) or greater.  You develop worsening pain at or near the incision site. MAKE SURE YOU:   Understand these instructions.  Will watch your condition.  Will get help right away if you are not doing well or get worse. Document Released: 12/08/2006 Document Revised: 10/20/2011 Document Reviewed: 02/08/2007 [facility] Patient Information 2014 [place], [place].   ________________________________________________________________________

## 2016-01-22 NOTE — Progress Notes (Signed)
CHEST CT 12-22-15 EPIC EKG 12-10-15 EPIC LOV DR [id number] CARDIO/THORACIC 12-25-15 EPIC MEDICAL CLEARANCE DR Hilliard Clark [place] ON CHART FOR 02-01-16 SURGERY

## 2016-01-23 DIAGNOSIS — S46012D Strain of muscle(s) and tendon(s) of the rotator cuff of left shoulder, subsequent encounter: Secondary | ICD-10-CM | POA: Diagnosis not present

## 2016-01-24 ENCOUNTER — Inpatient Hospital Stay (HOSPITAL_COMMUNITY)
Admission: RE | Admit: 2016-01-24 | Discharge: 2016-01-24 | Disposition: A | Discharge status: Home / Self Care | Payer: Medicare Other | Source: Ambulatory Visit

## 2016-01-27 ENCOUNTER — Other Ambulatory Visit: Payer: Self-pay | Admitting: Endocrinology

## 2016-01-30 ENCOUNTER — Ambulatory Visit (HOSPITAL_COMMUNITY)
Admission: RE | Admit: 2016-01-30 | Discharge: 2016-01-30 | Disposition: A | Discharge status: Home / Self Care | Payer: Medicare Other | Source: Ambulatory Visit | Attending: Cardiothoracic Surgery | Admitting: Cardiothoracic Surgery

## 2016-01-30 DIAGNOSIS — I351 Nonrheumatic aortic (valve) insufficiency: Secondary | ICD-10-CM | POA: Diagnosis not present

## 2016-01-30 DIAGNOSIS — Q231 Congenital insufficiency of aortic valve: Secondary | ICD-10-CM | POA: Insufficient documentation

## 2016-01-30 DIAGNOSIS — Z87891 Personal history of nicotine dependence: Secondary | ICD-10-CM | POA: Diagnosis not present

## 2016-01-30 DIAGNOSIS — I119 Hypertensive heart disease without heart failure: Secondary | ICD-10-CM | POA: Insufficient documentation

## 2016-01-30 DIAGNOSIS — K219 Gastro-esophageal reflux disease without esophagitis: Secondary | ICD-10-CM | POA: Insufficient documentation

## 2016-01-30 DIAGNOSIS — I77819 Aortic ectasia, unspecified site: Secondary | ICD-10-CM | POA: Insufficient documentation

## 2016-01-30 DIAGNOSIS — I071 Rheumatic tricuspid insufficiency: Secondary | ICD-10-CM | POA: Diagnosis not present

## 2016-01-30 DIAGNOSIS — E785 Hyperlipidemia, unspecified: Secondary | ICD-10-CM | POA: Insufficient documentation

## 2016-01-30 LAB — ECHOCARDIOGRAM COMPLETE
E decel time: 356 ms
E/e' ratio: 5.5
FS: 35 % (ref 28–44)
IVS/LV PW RATIO, ED: 1.03
LA ID, A-P, ES: 28 mm
LA diam end sys: 28 mm
LA diam index: 1.39 cm/m2
LA vol A4C: 37.7 mL
LV E/e' medial: 5.5
LV E/e'average: 5.5
LV PW d: 15.4 mm — AB (ref 0.6–1.1)
LV e' LATERAL: 7.94 cm/s
LVOT area: 3.14 cm2
LVOT diameter: 20 mm
MV Dec: 356
MV pk A vel: 72.8 m/s
MV pk E vel: 43.7 m/s
P 1/2 time: 586 ms
PV Reg grad dias: 6 mmHg
PV Reg vel dias: 124 cm/s
Reg peak vel: 254 cm/s
TAPSE: 19.1 mm
TDI e' lateral: 7.94
TDI e' medial: 4.35
TR max vel: 254 cm/s

## 2016-01-30 NOTE — Progress Notes (Signed)
*  PRELIMINARY RESULTS* Echocardiogram 2D Echocardiogram has been performed.  Leavy Cella 01/30/2016, 9:34 AM

## 2016-02-01 ENCOUNTER — Encounter (HOSPITAL_COMMUNITY): Admission: RE | Payer: Self-pay | Source: Ambulatory Visit

## 2016-02-01 ENCOUNTER — Inpatient Hospital Stay (HOSPITAL_COMMUNITY): Admission: RE | Admit: 2016-02-01 | Payer: Medicare Other | Source: Ambulatory Visit | Admitting: Specialist

## 2016-02-01 DIAGNOSIS — S46012D Strain of muscle(s) and tendon(s) of the rotator cuff of left shoulder, subsequent encounter: Secondary | ICD-10-CM | POA: Diagnosis not present

## 2016-02-01 DIAGNOSIS — M66812 Spontaneous rupture of other tendons, left shoulder: Secondary | ICD-10-CM | POA: Diagnosis not present

## 2016-02-01 DIAGNOSIS — G8918 Other acute postprocedural pain: Secondary | ICD-10-CM | POA: Diagnosis not present

## 2016-02-01 DIAGNOSIS — M75112 Incomplete rotator cuff tear or rupture of left shoulder, not specified as traumatic: Secondary | ICD-10-CM | POA: Diagnosis not present

## 2016-02-01 DIAGNOSIS — M24112 Other articular cartilage disorders, left shoulder: Secondary | ICD-10-CM | POA: Diagnosis not present

## 2016-02-01 DIAGNOSIS — M19012 Primary osteoarthritis, left shoulder: Secondary | ICD-10-CM | POA: Diagnosis not present

## 2016-02-01 SURGERY — ARTHROPLASTY, KNEE, TOTAL
Anesthesia: Choice | Site: Knee | Laterality: Right

## 2016-02-07 DIAGNOSIS — S46012D Strain of muscle(s) and tendon(s) of the rotator cuff of left shoulder, subsequent encounter: Secondary | ICD-10-CM | POA: Diagnosis not present

## 2016-02-07 DIAGNOSIS — M12812 Other specific arthropathies, not elsewhere classified, left shoulder: Secondary | ICD-10-CM | POA: Diagnosis not present

## 2016-02-07 DIAGNOSIS — Z4789 Encounter for other orthopedic aftercare: Secondary | ICD-10-CM | POA: Diagnosis not present

## 2016-02-11 DIAGNOSIS — M12812 Other specific arthropathies, not elsewhere classified, left shoulder: Secondary | ICD-10-CM | POA: Diagnosis not present

## 2016-02-11 DIAGNOSIS — S46012D Strain of muscle(s) and tendon(s) of the rotator cuff of left shoulder, subsequent encounter: Secondary | ICD-10-CM | POA: Diagnosis not present

## 2016-02-14 DIAGNOSIS — S46012D Strain of muscle(s) and tendon(s) of the rotator cuff of left shoulder, subsequent encounter: Secondary | ICD-10-CM | POA: Diagnosis not present

## 2016-02-18 DIAGNOSIS — S46012D Strain of muscle(s) and tendon(s) of the rotator cuff of left shoulder, subsequent encounter: Secondary | ICD-10-CM | POA: Diagnosis not present

## 2016-02-18 DIAGNOSIS — Z4789 Encounter for other orthopedic aftercare: Secondary | ICD-10-CM | POA: Diagnosis not present

## 2016-02-25 DIAGNOSIS — S46012D Strain of muscle(s) and tendon(s) of the rotator cuff of left shoulder, subsequent encounter: Secondary | ICD-10-CM | POA: Diagnosis not present

## 2016-02-25 DIAGNOSIS — Z4789 Encounter for other orthopedic aftercare: Secondary | ICD-10-CM | POA: Diagnosis not present

## 2016-02-28 ENCOUNTER — Other Ambulatory Visit: Payer: Self-pay | Admitting: Endocrinology

## 2016-03-03 DIAGNOSIS — S46012D Strain of muscle(s) and tendon(s) of the rotator cuff of left shoulder, subsequent encounter: Secondary | ICD-10-CM | POA: Diagnosis not present

## 2016-03-10 DIAGNOSIS — S46012D Strain of muscle(s) and tendon(s) of the rotator cuff of left shoulder, subsequent encounter: Secondary | ICD-10-CM | POA: Diagnosis not present

## 2016-03-17 DIAGNOSIS — M12812 Other specific arthropathies, not elsewhere classified, left shoulder: Secondary | ICD-10-CM | POA: Diagnosis not present

## 2016-03-20 DIAGNOSIS — S46012D Strain of muscle(s) and tendon(s) of the rotator cuff of left shoulder, subsequent encounter: Secondary | ICD-10-CM | POA: Diagnosis not present

## 2016-03-20 DIAGNOSIS — F339 Major depressive disorder, recurrent, unspecified: Secondary | ICD-10-CM | POA: Diagnosis not present

## 2016-03-24 DIAGNOSIS — Z4789 Encounter for other orthopedic aftercare: Secondary | ICD-10-CM | POA: Diagnosis not present

## 2016-03-24 DIAGNOSIS — S46012D Strain of muscle(s) and tendon(s) of the rotator cuff of left shoulder, subsequent encounter: Secondary | ICD-10-CM | POA: Diagnosis not present

## 2016-03-24 DIAGNOSIS — M25511 Pain in right shoulder: Secondary | ICD-10-CM | POA: Diagnosis not present

## 2016-03-27 DIAGNOSIS — S46012D Strain of muscle(s) and tendon(s) of the rotator cuff of left shoulder, subsequent encounter: Secondary | ICD-10-CM | POA: Diagnosis not present

## 2016-04-03 DIAGNOSIS — S46012D Strain of muscle(s) and tendon(s) of the rotator cuff of left shoulder, subsequent encounter: Secondary | ICD-10-CM | POA: Diagnosis not present

## 2016-04-07 DIAGNOSIS — M12812 Other specific arthropathies, not elsewhere classified, left shoulder: Secondary | ICD-10-CM | POA: Diagnosis not present

## 2016-04-08 DIAGNOSIS — H43391 Other vitreous opacities, right eye: Secondary | ICD-10-CM | POA: Diagnosis not present

## 2016-04-10 DIAGNOSIS — M12812 Other specific arthropathies, not elsewhere classified, left shoulder: Secondary | ICD-10-CM | POA: Diagnosis not present

## 2016-04-11 ENCOUNTER — Ambulatory Visit (INDEPENDENT_AMBULATORY_CARE_PROVIDER_SITE_OTHER): Payer: Medicare Other | Admitting: Endocrinology

## 2016-04-11 ENCOUNTER — Encounter: Payer: Self-pay | Admitting: Endocrinology

## 2016-04-11 VITALS — BP 126/82 | HR 75 | Ht 70.0 in | Wt 179.0 lb

## 2016-04-11 DIAGNOSIS — Z23 Encounter for immunization: Secondary | ICD-10-CM

## 2016-04-11 DIAGNOSIS — Z0000 Encounter for general adult medical examination without abnormal findings: Secondary | ICD-10-CM

## 2016-04-11 DIAGNOSIS — C61 Malignant neoplasm of prostate: Secondary | ICD-10-CM

## 2016-04-11 DIAGNOSIS — E018 Other iodine-deficiency related thyroid disorders and allied conditions: Secondary | ICD-10-CM | POA: Diagnosis not present

## 2016-04-11 DIAGNOSIS — M810 Age-related osteoporosis without current pathological fracture: Secondary | ICD-10-CM | POA: Diagnosis not present

## 2016-04-11 DIAGNOSIS — I10 Essential (primary) hypertension: Secondary | ICD-10-CM | POA: Diagnosis not present

## 2016-04-11 DIAGNOSIS — E785 Hyperlipidemia, unspecified: Secondary | ICD-10-CM

## 2016-04-11 DIAGNOSIS — Z125 Encounter for screening for malignant neoplasm of prostate: Secondary | ICD-10-CM

## 2016-04-11 LAB — HEPATIC FUNCTION PANEL
ALK PHOS: 75 U/L (ref 39–117)
ALT: 20 U/L (ref 0–53)
AST: 21 U/L (ref 0–37)
Albumin: 4.5 g/dL (ref 3.5–5.2)
BILIRUBIN DIRECT: 0.1 mg/dL (ref 0.0–0.3)
BILIRUBIN TOTAL: 0.6 mg/dL (ref 0.2–1.2)
Total Protein: 6.9 g/dL (ref 6.0–8.3)

## 2016-04-11 LAB — BASIC METABOLIC PANEL
BUN: 14 mg/dL (ref 6–23)
CALCIUM: 9.5 mg/dL (ref 8.4–10.5)
CO2: 31 mEq/L (ref 19–32)
Chloride: 103 mEq/L (ref 96–112)
Creatinine, Ser: 0.75 mg/dL (ref 0.40–1.50)
GFR: 108.95 mL/min (ref >60.00)
Glucose, Bld: 74 mg/dL (ref 70–99)
POTASSIUM: 4.5 meq/L (ref 3.5–5.1)
SODIUM: 140 meq/L (ref 135–145)

## 2016-04-11 LAB — CBC WITH DIFFERENTIAL/PLATELET
Basophils Absolute: 0 10*3/uL (ref 0.0–0.1)
Basophils Relative: 0.5 % (ref 0.0–3.0)
EOS PCT: 3.3 % (ref 0.0–5.0)
Eosinophils Absolute: 0.3 10*3/uL (ref 0.0–0.7)
HEMATOCRIT: 43.4 % (ref 39.0–52.0)
Hemoglobin: 14.6 g/dL (ref 13.0–17.0)
LYMPHS ABS: 2.1 10*3/uL (ref 0.7–4.0)
LYMPHS PCT: 25.2 % (ref 12.0–46.0)
MCHC: 33.7 g/dL (ref 30.0–36.0)
MCV: 91.4 fl (ref 78.0–100.0)
MONOS PCT: 12.9 % — AB (ref 3.0–12.0)
Monocytes Absolute: 1.1 10*3/uL — ABNORMAL HIGH (ref 0.1–1.0)
NEUTROS PCT: 58.1 % (ref 43.0–77.0)
Neutro Abs: 4.9 10*3/uL (ref 1.4–7.7)
Platelets: 280 10*3/uL (ref 150.0–400.0)
RBC: 4.75 Mil/uL (ref 4.22–5.81)
RDW: 14.6 % (ref 11.5–15.5)
WBC: 8.5 10*3/uL (ref 4.0–10.5)

## 2016-04-11 LAB — LIPID PANEL
CHOL/HDL RATIO: 3
Cholesterol: 181 mg/dL (ref 0–200)
HDL: 59 mg/dL (ref >39.00)
NONHDL: 121.69
Triglycerides: 240 mg/dL — ABNORMAL HIGH (ref 0.0–149.0)
VLDL: 48 mg/dL — ABNORMAL HIGH (ref 0.0–40.0)

## 2016-04-11 LAB — TSH: TSH: 7.12 u[IU]/mL — AB (ref 0.35–4.50)

## 2016-04-11 LAB — LDL CHOLESTEROL, DIRECT: Direct LDL: 93 mg/dL

## 2016-04-11 LAB — PSA: PSA: 0.07 ng/mL — AB (ref 0.10–4.00)

## 2016-04-11 MED ORDER — LEVOTHYROXINE SODIUM 125 MCG PO TABS: 125.0000 ug | ORAL_TABLET | Freq: Every day | ORAL | 3 refills | Status: DC

## 2016-04-11 NOTE — Progress Notes (Signed)
we discussed code status.  pt requests full code, but would not want to be started or maintained on artificial life-support measures if there was not a reasonable chance of recovery 

## 2016-04-11 NOTE — Progress Notes (Signed)
Subjective:    Patient ID: Bryan Tyler, male    DOB: Jun 04, 1946, 70 y.o.   MRN: [id number]  HPI The state of at least three ongoing medical problems is addressed today, with interval history of each noted here: Hypothyroidism: no weight change. HTN: denies sob Dyslipidemia: denies chest pain.  Past Medical History:  Diagnosis Date  . Adenocarcinoma of prostate ([place]) 04/16/2013  . Anxiety   . Arthritis   . Depression   . Diverticulosis   . GERD (gastroesophageal reflux disease)   . History of hyperthyroidism   . Hyperlipidemia   . Hypothyroidism, postradioiodine therapy    1980's  . Nocturia   . Organic impotence   . OSA (obstructive sleep apnea)    NO CPAP--  S/P SURGERY  2002  . Osteoporosis   . Urge urinary incontinence   . Wears contact lenses     Past Surgical History:  Procedure Laterality Date  . HEMORRHOIDECTOMY WITH HEMORRHOID BANDING  2007  . INGUINAL HERNIA REPAIR Bilateral    x4  (2 each side)  . KNEE ARTHROSCOPY Right 2012  . PROSTATE BIOPSY    . RADIOACTIVE SEED IMPLANT N/A 05/30/2013   Procedure: RADIOACTIVE SEED IMPLANT;  Surgeon: Hanley Ben, MD;  Location: [facility];  Service: Urology;  Laterality: N/A;  . ROTATOR CUFF REPAIR Right 2004  . TONSILLECTOMY  AS CHILD  . UVULOPALATOPHARYNGOPLASTY  2002   w/ septoplasty and rhinoplasty    Social History   Social History  . Marital status: Married    Spouse name: N/A  . Number of children: N/A  . Years of education: N/A   Occupational History  . Middle School Teacher    Social History Main Topics  . Smoking status: Former Smoker    Packs/day: 1.00    Years: 20.00    Types: Cigarettes    Quit date: 08/11/1988  . Smokeless tobacco: Current User  . Alcohol use No     Comment: occasionally  . Drug use: No  . Sexual activity: Not on file   Other Topics Concern  . Not on file   Social History Narrative  . No narrative on file    Current Outpatient Prescriptions on  File Prior to Visit  Medication Sig Dispense Refill  . amLODipine (NORVASC) 2.5 MG tablet Take 1 tablet (2.5 mg total) by mouth daily. 90 tablet 3  . aspirin 81 MG tablet Take 81 mg by mouth daily.    Marland Kitchen atorvastatin (LIPITOR) 40 MG tablet TAKE 1 TABLET BY MOUTH EVERY EVENING 90 tablet 0  . clonazePAM (KLONOPIN) 1 MG tablet Take 1 tablet by mouth 2 (two) times daily.  2  . gabapentin (NEURONTIN) 400 MG capsule Take 400 mg by mouth 2 (two) times daily.   2  . ibuprofen (ADVIL,MOTRIN) 200 MG tablet Take 400-600 mg by mouth every 6 (six) hours as needed (For pain.).    Marland Kitchen losartan (COZAAR) 100 MG tablet Take 1 tablet (100 mg total) by mouth daily. 90 tablet 3  . mesalamine (CANASA) 1000 MG suppository Place 1 suppository (1,000 mg total) rectally at bedtime. (Patient taking differently: Place 1,000 mg rectally daily as needed (bleeding.). ) 30 suppository 1  . Multiple Vitamin (MULTIVITAMIN WITH MINERALS) TABS tablet Take 1 tablet by mouth daily.    Marland Kitchen omeprazole (PRILOSEC) 40 MG capsule TAKE 1 CAPSULE (40 MG TOTAL) BY MOUTH EVERY MORNING. (Patient taking differently: TAKE 1 CAPSULE (40 MG TOTAL) BY MOUTH DAILY AS NEEDED FOR HEARTBURN  OR ACID REFLUX.) 90 capsule 3  . ondansetron (ZOFRAN) 4 MG tablet Take 1 tablet (4 mg total) by mouth every 8 (eight) hours as needed for nausea or vomiting. 10 tablet 0  . oxymetazoline (ANEFRIN NASAL SPRAY) 0.05 % nasal spray Place 1 spray into both nostrils at bedtime as needed for congestion.    . senna-docusate (SENOKOT-S) 8.6-50 MG tablet Take 1 tablet by mouth 2 (two) times daily. (Patient taking differently: Take 1 tablet by mouth 2 (two) times daily as needed (For constipation.). ) 21 tablet 0  . venlafaxine XR (EFFEXOR-XR) 150 MG 24 hr capsule Take 300 mg by mouth daily with breakfast.     No current facility-administered medications on file prior to visit.     Allergies  Allergen Reactions  . Codeine Nausea And Vomiting and Other (See Comments)    DIZZINESS      Family History  Problem Relation Age of Onset  . Cancer Mother     Pancreatic Cancer  . Heart disease Father 18    CABG    BP 126/82   Pulse 75   Ht 5\' 10"  (1.778 m)   Wt 179 lb (81.2 kg)   SpO2 95%   BMI 25.68 kg/m    Review of Systems He has dry skin.  Denies leg edema    Objective:   Physical Exam VITAL SIGNS:  See vs page GENERAL: no distress NECK: There is no palpable thyroid enlargement.  No thyroid nodule is palpable.  No palpable lymphadenopathy at the anterior neck. LUNGS:  Clear to auscultation HEART:  Regular rate and rhythm without murmurs noted. Normal S1,S2.   Ext: no edema  Lab Results  Component Value Date   CHOL 181 04/11/2016   HDL 59.00 04/11/2016   LDLCALC 100 (H) 02/06/2014   LDLDIRECT 93.0 04/11/2016   TRIG 240.0 (H) 04/11/2016   CHOLHDL 3 04/11/2016   Lab Results  Component Value Date   TSH 7.12 (H) 04/11/2016      Assessment & Plan:  Dyslipidemia: well-controlled Hypothyroidism: he needs increased rx.  I have sent a prescription to your pharmacy, to increase synthroid HTN: well-controlled.  Please continue the same medication    Subjective:   Patient here for Medicare annual wellness visit and management of other chronic and acute problems.     Risk factors: advanced age    57 of Physicians Providing Medical Care to Patient:  See "snapshot"   Activities of Daily Living: In your present state of health, do you have any difficulty performing the following activities?:  Preparing food and eating?: No  Bathing yourself: No  Getting dressed: No  Using the toilet:No  Moving around from place to place: No  In the past year have you fallen or had a near fall?: No    Home Safety: Has smoke detector and wears seat belts. No firearms. No excess sun exposure.  Diet and Exercise  Current exercise habits: pt says good Dietary issues discussed: pt reports a healthy diet   Depression Screen  Q1: Over the past two weeks, have  you felt down, depressed or hopeless? Depression is well-controlled Q2: Over the past two weeks, have you felt little interest or pleasure in doing things? Depression is well-controlled.     The following portions of the patient's history were reviewed and updated as appropriate: allergies, current medications, past family history, past medical history, past social history, past surgical history and problem list.   Review of Systems  Denies hearing loss,  and visual loss Objective:   Vision:  Advertising account executive, so he declines VA today Hearing: grossly normal Body mass index:  See vs page.  Msk: pt easily and quickly performs "get-up-and-go" from a sitting position.   Cognitive Impairment Assessment: cognition, memory and judgment appear normal.  remembers 3/3 at 5 minutes.  excellent recall.  can easily read and write a sentence.  alert and oriented x 3.     Assessment:   Medicare wellness utd on preventive parameters    Plan:   During the course of the visit the patient was educated and counseled about appropriate screening and preventive services including:       Fall prevention    Diabetes screening  Nutrition counseling   Vaccines / LABS Zostavax / Pneumococcal Vaccine  today  PSA  Patient Instructions (the written plan) was given to the patient.

## 2016-04-11 NOTE — Patient Instructions (Addendum)
good diet and exercise significantly improve your health.  please let me know if you wish to be referred to a dietician.  high blood sugar is very risky to your health.  you should see an eye doctor and dentist every year.  It is very important to get all recommended vaccinations.  It is critically important to prevent falling down (keep floor areas well-lit, dry, and free of loose objects.  If you have a cane, walker, or wheelchair, you should use it, even for short trips around the house.  Wear flat-soled shoes.  Also, try not to rush). Please return in 1 year.

## 2016-04-15 LAB — PTH, INTACT AND CALCIUM
CALCIUM: 9.6 mg/dL (ref 8.6–10.3)
PTH: 34 pg/mL (ref 14–64)

## 2016-04-28 DIAGNOSIS — M12812 Other specific arthropathies, not elsewhere classified, left shoulder: Secondary | ICD-10-CM | POA: Diagnosis not present

## 2016-05-01 DIAGNOSIS — F339 Major depressive disorder, recurrent, unspecified: Secondary | ICD-10-CM | POA: Diagnosis not present

## 2016-05-01 DIAGNOSIS — M12812 Other specific arthropathies, not elsewhere classified, left shoulder: Secondary | ICD-10-CM | POA: Diagnosis not present

## 2016-05-12 DIAGNOSIS — S46012D Strain of muscle(s) and tendon(s) of the rotator cuff of left shoulder, subsequent encounter: Secondary | ICD-10-CM | POA: Diagnosis not present

## 2016-05-12 DIAGNOSIS — M25511 Pain in right shoulder: Secondary | ICD-10-CM | POA: Diagnosis not present

## 2016-05-12 DIAGNOSIS — Z4789 Encounter for other orthopedic aftercare: Secondary | ICD-10-CM | POA: Diagnosis not present

## 2016-05-15 DIAGNOSIS — N3281 Overactive bladder: Secondary | ICD-10-CM | POA: Diagnosis not present

## 2016-05-15 DIAGNOSIS — C61 Malignant neoplasm of prostate: Secondary | ICD-10-CM | POA: Diagnosis not present

## 2016-05-22 ENCOUNTER — Other Ambulatory Visit: Payer: Self-pay | Admitting: Endocrinology

## 2016-05-27 DIAGNOSIS — M25511 Pain in right shoulder: Secondary | ICD-10-CM | POA: Diagnosis not present

## 2016-06-02 ENCOUNTER — Telehealth: Payer: Self-pay | Admitting: Endocrinology

## 2016-06-02 NOTE — Telephone Encounter (Signed)
error 

## 2016-06-04 DIAGNOSIS — S46011A Strain of muscle(s) and tendon(s) of the rotator cuff of right shoulder, initial encounter: Secondary | ICD-10-CM | POA: Diagnosis not present

## 2016-06-04 DIAGNOSIS — S46012D Strain of muscle(s) and tendon(s) of the rotator cuff of left shoulder, subsequent encounter: Secondary | ICD-10-CM | POA: Diagnosis not present

## 2016-06-04 DIAGNOSIS — Z4789 Encounter for other orthopedic aftercare: Secondary | ICD-10-CM | POA: Diagnosis not present

## 2016-06-12 DIAGNOSIS — F339 Major depressive disorder, recurrent, unspecified: Secondary | ICD-10-CM | POA: Diagnosis not present

## 2016-07-17 DIAGNOSIS — S46001A Unspecified injury of muscle(s) and tendon(s) of the rotator cuff of right shoulder, initial encounter: Secondary | ICD-10-CM | POA: Diagnosis not present

## 2016-07-17 DIAGNOSIS — G8918 Other acute postprocedural pain: Secondary | ICD-10-CM | POA: Diagnosis not present

## 2016-07-17 DIAGNOSIS — Y999 Unspecified external cause status: Secondary | ICD-10-CM | POA: Diagnosis not present

## 2016-07-17 DIAGNOSIS — S46011A Strain of muscle(s) and tendon(s) of the rotator cuff of right shoulder, initial encounter: Secondary | ICD-10-CM | POA: Diagnosis not present

## 2016-07-17 DIAGNOSIS — M7551 Bursitis of right shoulder: Secondary | ICD-10-CM | POA: Diagnosis not present

## 2016-07-24 DIAGNOSIS — M19011 Primary osteoarthritis, right shoulder: Secondary | ICD-10-CM | POA: Diagnosis not present

## 2016-07-24 DIAGNOSIS — M75101 Unspecified rotator cuff tear or rupture of right shoulder, not specified as traumatic: Secondary | ICD-10-CM | POA: Diagnosis not present

## 2016-07-25 DIAGNOSIS — Z4789 Encounter for other orthopedic aftercare: Secondary | ICD-10-CM | POA: Diagnosis not present

## 2016-07-25 DIAGNOSIS — M19011 Primary osteoarthritis, right shoulder: Secondary | ICD-10-CM | POA: Diagnosis not present

## 2016-07-25 DIAGNOSIS — M75101 Unspecified rotator cuff tear or rupture of right shoulder, not specified as traumatic: Secondary | ICD-10-CM | POA: Diagnosis not present

## 2016-07-28 DIAGNOSIS — F339 Major depressive disorder, recurrent, unspecified: Secondary | ICD-10-CM | POA: Diagnosis not present

## 2016-07-29 DIAGNOSIS — M19011 Primary osteoarthritis, right shoulder: Secondary | ICD-10-CM | POA: Diagnosis not present

## 2016-07-29 DIAGNOSIS — M75101 Unspecified rotator cuff tear or rupture of right shoulder, not specified as traumatic: Secondary | ICD-10-CM | POA: Diagnosis not present

## 2016-07-31 DIAGNOSIS — M19011 Primary osteoarthritis, right shoulder: Secondary | ICD-10-CM | POA: Diagnosis not present

## 2016-07-31 DIAGNOSIS — M75101 Unspecified rotator cuff tear or rupture of right shoulder, not specified as traumatic: Secondary | ICD-10-CM | POA: Diagnosis not present

## 2016-08-05 DIAGNOSIS — M75101 Unspecified rotator cuff tear or rupture of right shoulder, not specified as traumatic: Secondary | ICD-10-CM | POA: Diagnosis not present

## 2016-08-05 DIAGNOSIS — M19011 Primary osteoarthritis, right shoulder: Secondary | ICD-10-CM | POA: Diagnosis not present

## 2016-08-07 DIAGNOSIS — M75101 Unspecified rotator cuff tear or rupture of right shoulder, not specified as traumatic: Secondary | ICD-10-CM | POA: Diagnosis not present

## 2016-08-07 DIAGNOSIS — M19011 Primary osteoarthritis, right shoulder: Secondary | ICD-10-CM | POA: Diagnosis not present

## 2016-08-08 DIAGNOSIS — Z4789 Encounter for other orthopedic aftercare: Secondary | ICD-10-CM | POA: Diagnosis not present

## 2016-08-08 DIAGNOSIS — S46011D Strain of muscle(s) and tendon(s) of the rotator cuff of right shoulder, subsequent encounter: Secondary | ICD-10-CM | POA: Diagnosis not present

## 2016-08-12 DIAGNOSIS — M75101 Unspecified rotator cuff tear or rupture of right shoulder, not specified as traumatic: Secondary | ICD-10-CM | POA: Diagnosis not present

## 2016-08-12 DIAGNOSIS — M19011 Primary osteoarthritis, right shoulder: Secondary | ICD-10-CM | POA: Diagnosis not present

## 2016-08-14 DIAGNOSIS — M75101 Unspecified rotator cuff tear or rupture of right shoulder, not specified as traumatic: Secondary | ICD-10-CM | POA: Diagnosis not present

## 2016-08-14 DIAGNOSIS — M19011 Primary osteoarthritis, right shoulder: Secondary | ICD-10-CM | POA: Diagnosis not present

## 2016-08-17 ENCOUNTER — Other Ambulatory Visit: Payer: Self-pay | Admitting: Endocrinology

## 2016-08-19 DIAGNOSIS — M75101 Unspecified rotator cuff tear or rupture of right shoulder, not specified as traumatic: Secondary | ICD-10-CM | POA: Diagnosis not present

## 2016-08-19 DIAGNOSIS — M19011 Primary osteoarthritis, right shoulder: Secondary | ICD-10-CM | POA: Diagnosis not present

## 2016-08-21 DIAGNOSIS — M19011 Primary osteoarthritis, right shoulder: Secondary | ICD-10-CM | POA: Diagnosis not present

## 2016-08-21 DIAGNOSIS — M75101 Unspecified rotator cuff tear or rupture of right shoulder, not specified as traumatic: Secondary | ICD-10-CM | POA: Diagnosis not present

## 2016-08-25 DIAGNOSIS — M19011 Primary osteoarthritis, right shoulder: Secondary | ICD-10-CM | POA: Diagnosis not present

## 2016-08-25 DIAGNOSIS — M75101 Unspecified rotator cuff tear or rupture of right shoulder, not specified as traumatic: Secondary | ICD-10-CM | POA: Diagnosis not present

## 2016-08-26 DIAGNOSIS — S46011D Strain of muscle(s) and tendon(s) of the rotator cuff of right shoulder, subsequent encounter: Secondary | ICD-10-CM | POA: Diagnosis not present

## 2016-08-26 DIAGNOSIS — Z4789 Encounter for other orthopedic aftercare: Secondary | ICD-10-CM | POA: Diagnosis not present

## 2016-08-30 DIAGNOSIS — M75101 Unspecified rotator cuff tear or rupture of right shoulder, not specified as traumatic: Secondary | ICD-10-CM | POA: Diagnosis not present

## 2016-08-30 DIAGNOSIS — M19011 Primary osteoarthritis, right shoulder: Secondary | ICD-10-CM | POA: Diagnosis not present

## 2016-09-02 DIAGNOSIS — M75101 Unspecified rotator cuff tear or rupture of right shoulder, not specified as traumatic: Secondary | ICD-10-CM | POA: Diagnosis not present

## 2016-09-02 DIAGNOSIS — M19011 Primary osteoarthritis, right shoulder: Secondary | ICD-10-CM | POA: Diagnosis not present

## 2016-09-04 DIAGNOSIS — M75101 Unspecified rotator cuff tear or rupture of right shoulder, not specified as traumatic: Secondary | ICD-10-CM | POA: Diagnosis not present

## 2016-09-04 DIAGNOSIS — M19011 Primary osteoarthritis, right shoulder: Secondary | ICD-10-CM | POA: Diagnosis not present

## 2016-09-09 DIAGNOSIS — M19011 Primary osteoarthritis, right shoulder: Secondary | ICD-10-CM | POA: Diagnosis not present

## 2016-09-09 DIAGNOSIS — M75101 Unspecified rotator cuff tear or rupture of right shoulder, not specified as traumatic: Secondary | ICD-10-CM | POA: Diagnosis not present

## 2016-09-11 DIAGNOSIS — M19011 Primary osteoarthritis, right shoulder: Secondary | ICD-10-CM | POA: Diagnosis not present

## 2016-09-11 DIAGNOSIS — M75101 Unspecified rotator cuff tear or rupture of right shoulder, not specified as traumatic: Secondary | ICD-10-CM | POA: Diagnosis not present

## 2016-09-15 DIAGNOSIS — M75101 Unspecified rotator cuff tear or rupture of right shoulder, not specified as traumatic: Secondary | ICD-10-CM | POA: Diagnosis not present

## 2016-09-15 DIAGNOSIS — M19011 Primary osteoarthritis, right shoulder: Secondary | ICD-10-CM | POA: Diagnosis not present

## 2016-09-18 DIAGNOSIS — M75101 Unspecified rotator cuff tear or rupture of right shoulder, not specified as traumatic: Secondary | ICD-10-CM | POA: Diagnosis not present

## 2016-09-18 DIAGNOSIS — M19011 Primary osteoarthritis, right shoulder: Secondary | ICD-10-CM | POA: Diagnosis not present

## 2016-09-23 ENCOUNTER — Other Ambulatory Visit: Payer: Self-pay | Admitting: Endocrinology

## 2016-09-23 DIAGNOSIS — M75101 Unspecified rotator cuff tear or rupture of right shoulder, not specified as traumatic: Secondary | ICD-10-CM | POA: Diagnosis not present

## 2016-09-23 DIAGNOSIS — M19011 Primary osteoarthritis, right shoulder: Secondary | ICD-10-CM | POA: Diagnosis not present

## 2016-09-24 DIAGNOSIS — S46011D Strain of muscle(s) and tendon(s) of the rotator cuff of right shoulder, subsequent encounter: Secondary | ICD-10-CM | POA: Diagnosis not present

## 2016-09-24 DIAGNOSIS — Z4789 Encounter for other orthopedic aftercare: Secondary | ICD-10-CM | POA: Diagnosis not present

## 2016-09-24 DIAGNOSIS — S46012D Strain of muscle(s) and tendon(s) of the rotator cuff of left shoulder, subsequent encounter: Secondary | ICD-10-CM | POA: Diagnosis not present

## 2016-09-24 DIAGNOSIS — M25512 Pain in left shoulder: Secondary | ICD-10-CM | POA: Diagnosis not present

## 2016-10-09 DIAGNOSIS — M75101 Unspecified rotator cuff tear or rupture of right shoulder, not specified as traumatic: Secondary | ICD-10-CM | POA: Diagnosis not present

## 2016-10-09 DIAGNOSIS — M19011 Primary osteoarthritis, right shoulder: Secondary | ICD-10-CM | POA: Diagnosis not present

## 2016-10-14 DIAGNOSIS — M19011 Primary osteoarthritis, right shoulder: Secondary | ICD-10-CM | POA: Diagnosis not present

## 2016-10-14 DIAGNOSIS — M75101 Unspecified rotator cuff tear or rupture of right shoulder, not specified as traumatic: Secondary | ICD-10-CM | POA: Diagnosis not present

## 2016-10-16 DIAGNOSIS — M75101 Unspecified rotator cuff tear or rupture of right shoulder, not specified as traumatic: Secondary | ICD-10-CM | POA: Diagnosis not present

## 2016-10-16 DIAGNOSIS — M19011 Primary osteoarthritis, right shoulder: Secondary | ICD-10-CM | POA: Diagnosis not present

## 2016-10-22 DIAGNOSIS — F339 Major depressive disorder, recurrent, unspecified: Secondary | ICD-10-CM | POA: Diagnosis not present

## 2016-10-23 DIAGNOSIS — F339 Major depressive disorder, recurrent, unspecified: Secondary | ICD-10-CM | POA: Diagnosis not present

## 2016-10-27 DIAGNOSIS — C61 Malignant neoplasm of prostate: Secondary | ICD-10-CM | POA: Diagnosis not present

## 2016-10-31 ENCOUNTER — Other Ambulatory Visit: Payer: Self-pay | Admitting: Endocrinology

## 2016-11-03 DIAGNOSIS — N3281 Overactive bladder: Secondary | ICD-10-CM | POA: Diagnosis not present

## 2016-11-03 DIAGNOSIS — N5201 Erectile dysfunction due to arterial insufficiency: Secondary | ICD-10-CM | POA: Diagnosis not present

## 2016-11-03 DIAGNOSIS — N3943 Post-void dribbling: Secondary | ICD-10-CM | POA: Diagnosis not present

## 2016-11-03 DIAGNOSIS — C61 Malignant neoplasm of prostate: Secondary | ICD-10-CM | POA: Diagnosis not present

## 2016-11-11 ENCOUNTER — Other Ambulatory Visit: Payer: Self-pay | Admitting: Endocrinology

## 2016-11-14 DIAGNOSIS — H16223 Keratoconjunctivitis sicca, not specified as Sjogren's, bilateral: Secondary | ICD-10-CM | POA: Diagnosis not present

## 2016-11-14 DIAGNOSIS — M19011 Primary osteoarthritis, right shoulder: Secondary | ICD-10-CM | POA: Diagnosis not present

## 2016-11-14 DIAGNOSIS — M75101 Unspecified rotator cuff tear or rupture of right shoulder, not specified as traumatic: Secondary | ICD-10-CM | POA: Diagnosis not present

## 2016-11-14 DIAGNOSIS — H2513 Age-related nuclear cataract, bilateral: Secondary | ICD-10-CM | POA: Diagnosis not present

## 2016-11-20 ENCOUNTER — Other Ambulatory Visit: Payer: Self-pay | Admitting: Cardiothoracic Surgery

## 2016-11-20 DIAGNOSIS — I712 Thoracic aortic aneurysm, without rupture, unspecified: Secondary | ICD-10-CM

## 2016-12-15 DIAGNOSIS — S46011D Strain of muscle(s) and tendon(s) of the rotator cuff of right shoulder, subsequent encounter: Secondary | ICD-10-CM | POA: Diagnosis not present

## 2016-12-15 DIAGNOSIS — Z4789 Encounter for other orthopedic aftercare: Secondary | ICD-10-CM | POA: Diagnosis not present

## 2016-12-15 DIAGNOSIS — M1711 Unilateral primary osteoarthritis, right knee: Secondary | ICD-10-CM | POA: Diagnosis not present

## 2016-12-16 ENCOUNTER — Other Ambulatory Visit: Payer: Self-pay | Admitting: Endocrinology

## 2016-12-25 ENCOUNTER — Ambulatory Visit (INDEPENDENT_AMBULATORY_CARE_PROVIDER_SITE_OTHER): Payer: Medicare Other | Admitting: Endocrinology

## 2016-12-25 ENCOUNTER — Ambulatory Visit
Admission: RE | Admit: 2016-12-25 | Discharge: 2016-12-25 | Disposition: A | Discharge status: Home / Self Care | Payer: Medicare Other | Source: Ambulatory Visit | Attending: Endocrinology | Admitting: Endocrinology

## 2016-12-25 ENCOUNTER — Encounter: Payer: Self-pay | Admitting: Endocrinology

## 2016-12-25 VITALS — BP 128/86 | HR 75 | Ht 70.0 in | Wt 199.0 lb

## 2016-12-25 DIAGNOSIS — E018 Other iodine-deficiency related thyroid disorders and allied conditions: Secondary | ICD-10-CM

## 2016-12-25 DIAGNOSIS — M79673 Pain in unspecified foot: Secondary | ICD-10-CM | POA: Insufficient documentation

## 2016-12-25 DIAGNOSIS — M19071 Primary osteoarthritis, right ankle and foot: Secondary | ICD-10-CM | POA: Diagnosis not present

## 2016-12-25 DIAGNOSIS — M79671 Pain in right foot: Secondary | ICD-10-CM

## 2016-12-25 LAB — TSH: TSH: 1.64 u[IU]/mL (ref 0.35–4.50)

## 2016-12-25 LAB — URIC ACID: Uric Acid, Serum: 5.2 mg/dL (ref 4.0–7.8)

## 2016-12-25 MED ORDER — LEVOTHYROXINE SODIUM 137 MCG PO TABS: 137.0000 ug | ORAL_TABLET | Freq: Every day | ORAL | 3 refills | Status: DC

## 2016-12-25 MED ORDER — DICLOFENAC SODIUM 1 % TD GEL: 4.0000 g | Freq: Four times a day (QID) | TRANSDERMAL | 2 refills | Status: DC

## 2016-12-25 MED ORDER — TAMSULOSIN HCL 0.4 MG PO CAPS: 0.4000 mg | ORAL_CAPSULE | Freq: Every day | ORAL | 3 refills | Status: AC

## 2016-12-25 NOTE — Progress Notes (Signed)
Subjective:    Patient ID: Bryan Tyler, male    DOB: [birth year], 71 y.o.   MRN: [id number]  HPI Pt states 3 weeks of moderate pain at the right foot, but no assoc numbness.   Past Medical History:  Diagnosis Date  . Adenocarcinoma of prostate ([place]) 04/16/2013  . Anxiety   . Arthritis   . Depression   . Diverticulosis   . GERD (gastroesophageal reflux disease)   . History of hyperthyroidism   . Hyperlipidemia   . Hypothyroidism, postradioiodine therapy    1980's  . Nocturia   . Organic impotence   . OSA (obstructive sleep apnea)    NO CPAP--  S/P SURGERY  2002  . Osteoporosis   . Urge urinary incontinence   . Wears contact lenses     Past Surgical History:  Procedure Laterality Date  . HEMORRHOIDECTOMY WITH HEMORRHOID BANDING  2007  . INGUINAL HERNIA REPAIR Bilateral    x4  (2 each side)  . KNEE ARTHROSCOPY Right 2012  . PROSTATE BIOPSY    . RADIOACTIVE SEED IMPLANT N/A 05/30/2013   Procedure: RADIOACTIVE SEED IMPLANT;  Surgeon: Hanley Ben, MD;  Location: [facility];  Service: Urology;  Laterality: N/A;  . ROTATOR CUFF REPAIR Right 2004  . TONSILLECTOMY  AS CHILD  . UVULOPALATOPHARYNGOPLASTY  2002   w/ septoplasty and rhinoplasty    Social History   Social History  . Marital status: Married    Spouse name: N/A  . Number of children: N/A  . Years of education: N/A   Occupational History  . Middle School Teacher    Social History Main Topics  . Smoking status: Former Smoker    Packs/day: 1.00    Years: 20.00    Types: Cigarettes    Quit date: 08/11/1988  . Smokeless tobacco: Current User  . Alcohol use No     Comment: occasionally  . Drug use: No  . Sexual activity: Not on file   Other Topics Concern  . Not on file   Social History Narrative  . No narrative on file    Current Outpatient Prescriptions on File Prior to Visit  Medication Sig Dispense Refill  . amLODipine (NORVASC) 2.5 MG tablet TAKE 1 TABLET(2.5 MG) BY MOUTH  DAILY 90 tablet 0  . aspirin 81 MG tablet Take 81 mg by mouth daily.    Marland Kitchen atorvastatin (LIPITOR) 40 MG tablet TAKE 1 TABLET BY MOUTH EVERY EVENING 90 tablet 0  . clonazePAM (KLONOPIN) 1 MG tablet Take 1 tablet by mouth 2 (two) times daily.  2  . gabapentin (NEURONTIN) 400 MG capsule Take 400 mg by mouth 2 (two) times daily.   2  . ibuprofen (ADVIL,MOTRIN) 200 MG tablet Take 400-600 mg by mouth every 6 (six) hours as needed (For pain.).    Marland Kitchen losartan (COZAAR) 100 MG tablet TAKE 1 TABLET(100 MG) BY MOUTH DAILY 90 tablet 0  . mesalamine (CANASA) 1000 MG suppository Place 1 suppository (1,000 mg total) rectally at bedtime. (Patient taking differently: Place 1,000 mg rectally daily as needed (bleeding.). ) 30 suppository 1  . Multiple Vitamin (MULTIVITAMIN WITH MINERALS) TABS tablet Take 1 tablet by mouth daily.    Marland Kitchen omeprazole (PRILOSEC) 40 MG capsule TAKE 1 CAPSULE (40 MG TOTAL) BY MOUTH EVERY MORNING. (Patient taking differently: TAKE 1 CAPSULE (40 MG TOTAL) BY MOUTH DAILY AS NEEDED FOR HEARTBURN OR ACID REFLUX.) 90 capsule 3  . ondansetron (ZOFRAN) 4 MG tablet Take 1 tablet (4 mg  total) by mouth every 8 (eight) hours as needed for nausea or vomiting. 10 tablet 0  . oxymetazoline (ANEFRIN NASAL SPRAY) 0.05 % nasal spray Place 1 spray into both nostrils at bedtime as needed for congestion.    . senna-docusate (SENOKOT-S) 8.6-50 MG tablet Take 1 tablet by mouth 2 (two) times daily. (Patient taking differently: Take 1 tablet by mouth 2 (two) times daily as needed (For constipation.). ) 21 tablet 0  . venlafaxine XR (EFFEXOR-XR) 150 MG 24 hr capsule Take 300 mg by mouth daily with breakfast.     No current facility-administered medications on file prior to visit.     Allergies  Allergen Reactions  . Codeine Nausea And Vomiting and Other (See Comments)    DIZZINESS    Family History  Problem Relation Age of Onset  . Cancer Mother        Pancreatic Cancer  . Heart disease Father 25       CABG     BP 128/86   Pulse 75   Ht 5\' 10"  (1.778 m)   Wt 199 lb (90.3 kg)   SpO2 96%   BMI 28.55 kg/m    Review of Systems Denies foot swelling and rash.      Objective:   Physical Exam VITAL SIGNS:  See vs page GENERAL: no distress Pulses: dorsalis pedis intact bilat.   MSK: no deformity of the feet CV: no leg edema Skin:  no ulcer on the feet.  normal color and temp on the feet.  Neuro: sensation is intact to touch on the feet.  Ext: slight tenderness at the plantar aspect of the right foot, at the base of the 2nd and 3rd toes.       Assessment & Plan:  Anxiety: this limits rx options.  Foot pain, new, uncertain etiology.   Patient Instructions  blood tests and x-rays are requested for you today.  We'll let you know about the results.. Please see a specialist.  you will receive a phone call, about a day and time for an appointment. I have sent a prescription to your pharmacy, for a gel to apply to the area. Vary your shoes to see if this helps the pain.

## 2016-12-25 NOTE — Patient Instructions (Addendum)
blood tests and x-rays are requested for you today.  We'll let you know about the results.. Please see a specialist.  you will receive a phone call, about a day and time for an appointment. I have sent a prescription to your pharmacy, for a gel to apply to the area. Vary your shoes to see if this helps the pain.

## 2016-12-26 ENCOUNTER — Encounter: Payer: Self-pay | Admitting: Endocrinology

## 2017-01-20 DIAGNOSIS — F339 Major depressive disorder, recurrent, unspecified: Secondary | ICD-10-CM | POA: Diagnosis not present

## 2017-01-26 ENCOUNTER — Ambulatory Visit: Payer: Medicare Other

## 2017-01-26 ENCOUNTER — Other Ambulatory Visit: Payer: Self-pay | Admitting: Endocrinology

## 2017-01-26 ENCOUNTER — Ambulatory Visit (INDEPENDENT_AMBULATORY_CARE_PROVIDER_SITE_OTHER): Payer: Medicare Other | Admitting: Podiatry

## 2017-01-26 ENCOUNTER — Encounter: Payer: Self-pay | Admitting: Podiatry

## 2017-01-26 ENCOUNTER — Ambulatory Visit (INDEPENDENT_AMBULATORY_CARE_PROVIDER_SITE_OTHER): Payer: Medicare Other

## 2017-01-26 VITALS — BP 121/82 | HR 69 | Resp 16 | Ht 70.0 in | Wt 195.0 lb

## 2017-01-26 DIAGNOSIS — M779 Enthesopathy, unspecified: Secondary | ICD-10-CM

## 2017-01-26 DIAGNOSIS — M2042 Other hammer toe(s) (acquired), left foot: Secondary | ICD-10-CM | POA: Diagnosis not present

## 2017-01-26 DIAGNOSIS — M79673 Pain in unspecified foot: Secondary | ICD-10-CM | POA: Diagnosis not present

## 2017-01-26 MED ORDER — TRIAMCINOLONE ACETONIDE 10 MG/ML IJ SUSP
10.0000 mg | Freq: Once | INTRAMUSCULAR | Status: AC
  Administered 2017-01-26: 10 mg

## 2017-01-26 NOTE — Progress Notes (Signed)
   Subjective:    Patient ID: Bryan Tyler, male    DOB: November 20, 1945, 71 y.o.   MRN: [id number]  HPI Chief Complaint  Patient presents with  . Foot Pain    Right foot; plantar forefoot-below 4th & 5th toes; pt stated, "Pain radiates into toes; feels like have an internal blister"; x2 months      Review of Systems  Constitutional: Positive for unexpected weight change.  HENT: Positive for hearing loss, sinus pain and sneezing.   Gastrointestinal: Positive for constipation.  Musculoskeletal: Positive for arthralgias and gait problem.       Objective:   Physical Exam        Assessment & Plan:

## 2017-01-26 NOTE — Progress Notes (Signed)
Subjective:    Patient ID: Bryan Tyler, male   DOB: 71 y.o.   MRN: [id number]   HPI patient states she's having a lot of pain in his right foot and it's making it increasingly difficult for him to walk. States it's been hurting for 2 months he does not remember specific injury    Review of Systems  All other systems reviewed and are negative.       Objective:  Physical Exam  Cardiovascular: Intact distal pulses.   Musculoskeletal: Normal range of motion.  Neurological: He is alert.  Skin: Skin is warm.  Nursing note and vitals reviewed.  neurovascular status intact muscle strength adequate range of motion within normal limits with patient found to have exquisite discomfort third metatarsophalangeal joint with inflammation fluid around the joint surface. Patient's also noted to have splaying of the third and an second toe secondary to this and states he probably had some kind of injury that he is not aware     Assessment:   Inflammatory capsulitis with possible flexor plate disruption third metatarsophalangeal joint right foot      Plan:    H&P condition reviewed. Today I did a proximal nerve block of the area I then aspirated the third MPJ getting out of a fluid and then went ahead and injected quarter cc deck Smith some Kenalog and applied thick plantar padding to reduce stress on the joint surface. Discussed rigid bottom shoes and reappoint to recheck  X-rays indicated splaying of the digit but did not indicate signs of fracture or other arthritic process

## 2017-01-29 ENCOUNTER — Ambulatory Visit: Payer: Medicare Other | Admitting: Cardiothoracic Surgery

## 2017-01-29 ENCOUNTER — Ambulatory Visit
Admission: RE | Admit: 2017-01-29 | Discharge: 2017-01-29 | Disposition: A | Discharge status: Home / Self Care | Payer: Medicare Other | Source: Ambulatory Visit | Attending: Cardiothoracic Surgery | Admitting: Cardiothoracic Surgery

## 2017-01-29 DIAGNOSIS — I712 Thoracic aortic aneurysm, without rupture, unspecified: Secondary | ICD-10-CM

## 2017-01-29 MED ORDER — IOPAMIDOL (ISOVUE-370) INJECTION 76%: 75.0000 mL | Freq: Once | INTRAVENOUS | Status: DC | PRN

## 2017-02-07 ENCOUNTER — Other Ambulatory Visit: Payer: Self-pay | Admitting: Endocrinology

## 2017-02-09 ENCOUNTER — Ambulatory Visit: Payer: Medicare Other | Admitting: Cardiothoracic Surgery

## 2017-02-12 ENCOUNTER — Encounter: Payer: Self-pay | Admitting: Cardiothoracic Surgery

## 2017-02-12 ENCOUNTER — Encounter: Payer: Self-pay | Admitting: Podiatry

## 2017-02-12 ENCOUNTER — Ambulatory Visit (INDEPENDENT_AMBULATORY_CARE_PROVIDER_SITE_OTHER): Payer: Medicare Other | Admitting: Cardiothoracic Surgery

## 2017-02-12 ENCOUNTER — Ambulatory Visit (INDEPENDENT_AMBULATORY_CARE_PROVIDER_SITE_OTHER): Payer: Medicare Other | Admitting: Podiatry

## 2017-02-12 VITALS — BP 131/85 | HR 88 | Resp 20 | Ht 70.0 in | Wt 196.0 lb

## 2017-02-12 DIAGNOSIS — I712 Thoracic aortic aneurysm, without rupture: Secondary | ICD-10-CM | POA: Diagnosis not present

## 2017-02-12 DIAGNOSIS — I7121 Aneurysm of the ascending aorta, without rupture: Secondary | ICD-10-CM

## 2017-02-12 DIAGNOSIS — M2042 Other hammer toe(s) (acquired), left foot: Secondary | ICD-10-CM

## 2017-02-12 DIAGNOSIS — M779 Enthesopathy, unspecified: Secondary | ICD-10-CM

## 2017-02-12 NOTE — Progress Notes (Signed)
Bryan Tyler       [place] [address]             [phone]                    [facility] Medical Record #[id number] Date of Birth: 1945/10/25  Referring: Lacretia Leigh, Bryan Primary Care: Renato Shin, Bryan  Chief Complaint:    Chief Complaint  Patient presents with  . Thoracic Aortic Aneurysm    1 year f/u with CTA Chest    History of Present Illness:  Patient returns to the office today for one-year follow-up CTA of the chest to evaluate the size of his ascending aorta.     Bryan Tyler 71 y.o. male was previously i seen in the office for Incidental finding of a 4.2 cm ascending aorta. The patient was working around his house and fell off a 5 foot wall  and suffered fractured ribs on the left lower chest he was seen at the emergency room at [facility]. CT scan of the chest was done the only previous CT scan the patient had was in 2013 in the urology office the report does not note the size of his aorta. The films are not available to compare. The patient has no previous cardiac history. He denies chest discomfort with exception of the current pain in his left lower ribs, he denies exertional chest pain. He has never had an echocardiogram. He does have hypertension for which he takes medication.   On close questioning the patient has no family history of aortic dissection aortic aneurysms or sudden death at a premature age from unexplained causes.   .  Current Activity/ Functional Status:  Patient is independent with mobility/ambulation, transfers, ADL's, IADL's.   Zubrod Score: At the time of surgery this patient's most appropriate activity status/level should be described as: [x]     0    Normal activity, no symptoms []     1    Restricted in physical strenuous activity but ambulatory, able to do out light work []     2    Ambulatory and capable of self care, unable to do work activities, up and about               >50 % of waking  hours                              []     3    Only limited self care, in bed greater than 50% of waking hours []     4    Completely disabled, no self care, confined to bed or chair []     5    Moribund   Past Medical History:  Diagnosis Date  . Adenocarcinoma of prostate ([place]) 04/16/2013  . Anxiety   . Arthritis   . Depression   . Diverticulosis   . GERD (gastroesophageal reflux disease)   . History of hyperthyroidism   . Hyperlipidemia   . Hypothyroidism, postradioiodine therapy    1980's  . Nocturia   . Organic impotence   . OSA (obstructive sleep apnea)    NO CPAP--  S/P SURGERY  2002  . Osteoporosis   . Urge urinary incontinence   . Wears contact lenses     Past Surgical History:  Procedure Laterality Date  . HEMORRHOIDECTOMY WITH HEMORRHOID BANDING  2007  . INGUINAL HERNIA  REPAIR Bilateral    x4  (2 each side)  . KNEE ARTHROSCOPY Right 2012  . PROSTATE BIOPSY    . RADIOACTIVE SEED IMPLANT N/A 05/30/2013   Procedure: RADIOACTIVE SEED IMPLANT;  Surgeon: Hanley Ben, Bryan;  Location: [facility];  Service: Urology;  Laterality: N/A;  . ROTATOR CUFF REPAIR Right 2004  . TONSILLECTOMY  AS CHILD  . UVULOPALATOPHARYNGOPLASTY  2002   w/ septoplasty and rhinoplasty    Family History  Problem Relation Age of Onset  . Cancer Mother        Pancreatic Cancer  . Heart disease Father 22       CABG    Social History   Social History  . Marital status: Married    Spouse name: N/A  . Number of children: N/A  . Years of education: N/A   Occupational History  . Middle School Teacher    Social History Main Topics  . Smoking status: Former Smoker    Packs/day: 1.00    Years: 20.00    Types: Cigarettes    Quit date: 08/11/1988  . Smokeless tobacco: Current User  . Alcohol use No     Comment: occasionally  . Drug use: No  . Sexual activity: Not on file   Other Topics Concern  . Not on file   Social History Narrative  . No narrative on file     History  Smoking Status  . Former Smoker  . Packs/day: 1.00  . Years: 20.00  . Types: Cigarettes  . Quit date: 08/11/1988  Smokeless Tobacco  . Current User    History  Alcohol Use No    Comment: occasionally     Allergies  Allergen Reactions  . Codeine Nausea And Vomiting and Other (See Comments)    DIZZINESS    Current Tyler Prescriptions  Medication Sig Dispense Refill  . amLODipine (NORVASC) 2.5 MG tablet TAKE 1 TABLET(2.5 MG) BY MOUTH DAILY 90 tablet 0  . ARIPiprazole (ABILIFY) 5 MG tablet Take 5 mg by mouth daily.    Marland Kitchen aspirin 81 MG tablet Take 81 mg by mouth daily.    Marland Kitchen atorvastatin (LIPITOR) 40 MG tablet TAKE 1 TABLET BY MOUTH EVERY EVENING 90 tablet 0  . clonazePAM (KLONOPIN) 1 MG tablet Take 1 tablet by mouth 2 (two) times daily.  2  . diclofenac sodium (VOLTAREN) 1 % GEL Apply 4 g topically 4 (four) times daily. (Patient not taking: Reported on 02/12/2017) 100 g 2  . gabapentin (NEURONTIN) 400 MG capsule Take 400 mg by mouth 2 (two) times daily.   2  . ibuprofen (ADVIL,MOTRIN) 200 MG tablet Take 400-600 mg by mouth every 6 (six) hours as needed (For pain.).    Marland Kitchen levothyroxine (SYNTHROID, LEVOTHROID) 137 MCG tablet Take 1 tablet (137 mcg total) by mouth daily before breakfast. 90 tablet 3  . losartan (COZAAR) 100 MG tablet TAKE 1 TABLET(100 MG) BY MOUTH DAILY 90 tablet 0  . mesalamine (CANASA) 1000 MG suppository Place 1 suppository (1,000 mg total) rectally at bedtime. (Patient not taking: Reported on 02/12/2017) 30 suppository 1  . Multiple Vitamin (MULTIVITAMIN WITH MINERALS) TABS tablet Take 1 tablet by mouth daily.    Marland Kitchen omeprazole (PRILOSEC) 40 MG capsule TAKE 1 CAPSULE (40 MG TOTAL) BY MOUTH EVERY MORNING. (Patient taking differently: TAKE 1 CAPSULE (40 MG TOTAL) BY MOUTH DAILY AS NEEDED FOR HEARTBURN OR ACID REFLUX.) 90 capsule 3  . ondansetron (ZOFRAN) 4 MG tablet Take 1 tablet (4 mg total)  by mouth every 8 (eight) hours as needed for nausea or vomiting. 10  tablet 0  . oxymetazoline (ANEFRIN NASAL SPRAY) 0.05 % nasal spray Place 1 spray into both nostrils at bedtime as needed for congestion.    . senna-docusate (SENOKOT-S) 8.6-50 MG tablet Take 1 tablet by mouth 2 (two) times daily. (Patient taking differently: Take 1 tablet by mouth 2 (two) times daily as needed (For constipation.). ) 21 tablet 0  . tamsulosin (FLOMAX) 0.4 MG CAPS capsule Take 1 capsule (0.4 mg total) by mouth daily. 30 capsule 3  . venlafaxine XR (EFFEXOR-XR) 150 MG 24 hr capsule Take 300 mg by mouth daily with breakfast.     No current facility-administered medications for this visit.       Review of Systems:     Cardiac Review of Systems: Y or N  Chest Pain Aqua.Slicker  ]  Resting SOB Aqua.Slicker ] Exertional SOB N ]  Orthopnea Aqua.Slicker ]   Pedal Edema [ N  ]    PalpitationsN  ] Syncope  N  ]   Presyncope [  N]  General Review of Systems: [Y] = yes [  ]=no Constitional: recent weight change Aqua.Slicker ];  Wt loss over the last 3 months [   ] anorexia [  ]; fatigue [  ]; nausea [ N ]; night sweats [  ]; fever [  ]; or chills [  ];          Dental: poor dentition[  ]; Last Dentist visit:   Eye : blurred vision [ N ]; diplopia [ N  ]; vision changes [ N ];  Amaurosis fugax[ N ]; Resp: cough [  ];  wheezing[  ];  hemoptysis[  ]; shortness of breath[  ]; paroxysmal nocturnal dyspnea[  ]; dyspnea on exertion[  ]; or orthopnea[  ];  GI:  gallstones[  ], vomiting[  ];  dysphagia[  ]; melena[  ];  hematochezia [  ]; heartburn[  ];   Hx of  Colonoscopy[ y ]; GU: kidney stones [  ]; hematuria[N  ];   dysuria [ N ];  nocturia[  ];  history of     obstruction [  ]; urinary frequency Blue.Reese  ]             Skin: rash, swelling[  ];, hair loss[  ];  peripheral edema[  ];  or itching[  ]; Musculosketetal: myalgias[  ];  joint swelling[  ];  joint erythema[  ];  joint pain[ y ];  back pain[ n ];  Heme/Lymph: bruising[N  ];  bleeding[N  ];  anemia[  ];  Neuro: TIA[ N ];  headaches[  ];  stroke[  ];  vertigo[  ];  seizures[  ];    paresthesias[  N];  difficulty walking[  ];  Psych:dNepression[y  ]; anxiety[ y ];  Endocrine: diabetes[ N ];  thyroid dysfunction[ N ];  Immunizations: Flu up to date Blue.Reese  ]; Pneumococcal up to date [ y ];  Other:  Physical Exam: BP 131/85   Pulse 88   Resp 20   Ht 5\' 10"  (1.778 m)   Wt 196 lb (88.9 kg)   SpO2 96% Comment: RA  BMI 28.12 kg/m   PHYSICAL EXAMINATION: General appearance: alert, cooperative, appears stated age and no distress Head: Normocephalic, without obvious abnormality, atraumatic Neck: no adenopathy, no carotid bruit, no JVD, supple, symmetrical, trachea midline and thyroid not enlarged, symmetric, no tenderness/mass/nodules Lymph nodes: Cervical, supraclavicular, and  axillary nodes normal. Resp: clear to auscultation bilaterally Back: negative, symmetric, no curvature. ROM normal. No CVA tenderness. Cardio: regular rate and rhythm, S1, S2 normal, no murmur, click, rub or gallop GI: soft, non-tender; bowel sounds normal; no masses,  no organomegaly Extremities: extremities normal, atraumatic, no cyanosis or edema and Homans sign is negative, no sign of DVT Neurologic: Grossly normal   PATIENT HAS NO STIGMATA OF MARFAN'S HAS PALPABLE PT AND DP PULSES BILATERALLY   Diagnostic Studies & Laboratory data:     Recent Radiology Findings:   Ct Angio Chest Aorta W/cm &/or Wo/cm  Result Date: 01/29/2017 CLINICAL DATA:  Thoracic aortic aneurysm without rupture. EXAM: CT ANGIOGRAPHY CHEST WITH CONTRAST TECHNIQUE: Multidetector CT imaging of the chest was performed using the standard protocol during bolus administration of intravenous contrast. Multiplanar CT image reconstructions and MIPs were obtained to evaluate the vascular anatomy. CONTRAST:  75 mL of Isovue 370 intravenously. COMPARISON:  CT scan of Dec 22, 2015. FINDINGS: Cardiovascular: 4.2 cm ascending thoracic aortic aneurysm is noted. No dissection is noted. Transverse aortic arch measures 2.7 cm. Proximal  descending thoracic aorta measures 2.5 cm. No pericardial effusion is noted. Mediastinum/Nodes: No enlarged mediastinal, hilar, or axillary lymph nodes. Thyroid gland, trachea, and esophagus demonstrate no significant findings. Lungs/Pleura: No pneumothorax or pleural effusion is noted. Mild bronchiectasis is noted in both lower lobes with stable bibasilar peripheral scarring. No acute abnormality is noted. Upper Abdomen: Cholelithiasis is noted without inflammation. Stable left renal cyst. Musculoskeletal: No chest wall abnormality. No acute or significant osseous findings. Review of the MIP images confirms the above findings. IMPRESSION: Grossly stable 4.2 cm ascending thoracic aortic aneurysm. Recommend annual imaging followup by CTA or MRA. This recommendation follows 2010 ACCF/AHA/AATS/ACR/ASA/SCA/SCAI/SIR/STS/SVM Guidelines for the Diagnosis and Management of Patients with Thoracic Aortic Disease. Circulation. 2010; 121: O329-V916. Cholelithiasis without inflammation. Aortic aneurysm NOS (ICD10-I71.9). Electronically Signed   By: Marijo Conception, M.D.   On: 01/29/2017 14:07    I have independently reviewed the above radiology studies  and reviewed the findings with the patient.    Ct Chest W Contrast  12/22/2015  CLINICAL DATA:  71 year old male with fall EXAM: CT CHEST, ABDOMEN, AND PELVIS WITH CONTRAST TECHNIQUE: Multidetector CT imaging of the chest, abdomen and pelvis was performed following the standard protocol during bolus administration of intravenous contrast. CONTRAST:  125mL ISOVUE-300 IOPAMIDOL (ISOVUE-300) INJECTION 61% COMPARISON:  Abdominal MRI dated 03/02/2011 FINDINGS: CT CHEST There is mild diffuse interstitial coarsening with areas of atelectasis/ scarring at the lung bases. There is no focal consolidation, pleural effusion, or pneumothorax. An accessory azygos fissure noted. The central airways are patent. There is mild dilatation of the ascending aorta measuring 4.3 cm in diameter.  The thoracic aorta is otherwise unremarkable. No dissection or traumatic injury. The origins of the great vessels of the aortic arch appear patent. The central pulmonary arteries appear patent. There is no cardiomegaly or pericardial effusion. There is coronary vascular calcification. There is no hilar or mediastinal adenopathy. The esophagus is grossly unremarkable. The thyroid nodule is not visualized. There is no axillary adenopathy. The chest wall soft tissues appear unremarkable. There is osteopenia. There is minimally displaced fracture of the left posterior ninth rib. Nondisplaced fractures of the left posterior tenth and eleventh ribs noted. Right shoulder rotator cuff surgical pinning noted. CT ABDOMEN AND PELVIS No intra-abdominal free air or free fluid. There stone within the gallbladder. No pericholecystic fluid. Ultrasound may provide better evaluation of the gallbladder if clinically indicated. The  liver, pancreas, spleen, and the adrenal glands appear unremarkable. Multiple left renal hypodense lesions measuring up to 4.2 cm in the upper pole of the left kidney. The larger lesions measure cyst. The smaller lesions are too small to characterize but however seen on the prior MRI and described as cysts. The right kidney is unremarkable. There is no hydronephrosis on either side. The visualized ureters and urinary bladder appear unremarkable. Prostate brachytherapy seeds noted. There is sigmoid diverticulosis without acute inflammatory changes. Constipation. There is no evidence of bowel obstruction or active inflammation. Normal appendix. The abdominal aorta and IVC appear unremarkable. No portal venous gas identified. There is no adenopathy. The abdominal wall soft tissues appear unremarkable. There is degenerative changes of the spine. Old healed left pubic bone fracture deformity. No acute fracture. IMPRESSION: Fractures of the left posterior 9-11th ribs. No pneumothorax. No other acute/traumatic  intrathoracic, abdominal, or pelvic pathology identified. A 4.3 cm dilatation of the ascending aorta. Recommend annual imaging followup by CTA or MRA. This recommendation follows 2010 ACCF/AHA/AATS/ACR/ASA/SCA/SCAI/SIR/STS/SVM Guidelines for the Diagnosis and Management of Patients with Thoracic Aortic Disease. Circulation. 2010; 121: [id number] Electronically Signed   By: Anner Crete M.D.   On: 12/22/2015 22:39     Ct Cervical Spine Wo Contrast  12/22/2015  CLINICAL DATA:  Fall today.  Neck Pain EXAM: CT CERVICAL SPINE WITHOUT CONTRAST TECHNIQUE: Multidetector CT imaging of the cervical spine was performed without intravenous contrast. Multiplanar CT image reconstructions were also generated. COMPARISON:  None. FINDINGS: Disc degeneration and spondylosis C4-5 and C5-6. 2 mm anterior slip C7-T1 due to advanced facet degeneration on the left. Right-sided facet degeneration C2-3 and C3-4. Negative for cervical spine fracture. No mass or adenopathy in the neck. Lung apices clear. IMPRESSION: Cervical spine degenerative change.  No acute fracture. Electronically Signed   By: Franchot Gallo M.D.   On: 12/22/2015 22:13   Ct Abdomen Pelvis W Contrast  12/22/2015  CLINICAL DATA:  71 year old male with fall EXAM: CT CHEST, ABDOMEN, AND PELVIS WITH CONTRAST TECHNIQUE: Multidetector CT imaging of the chest, abdomen and pelvis was performed following the standard protocol during bolus administration of intravenous contrast. CONTRAST:  152mL ISOVUE-300 IOPAMIDOL (ISOVUE-300) INJECTION 61% COMPARISON:  Abdominal MRI dated 03/02/2011 FINDINGS: CT CHEST There is mild diffuse interstitial coarsening with areas of atelectasis/ scarring at the lung bases. There is no focal consolidation, pleural effusion, or pneumothorax. An accessory azygos fissure noted. The central airways are patent. There is mild dilatation of the ascending aorta measuring 4.3 cm in diameter. The thoracic aorta is otherwise unremarkable. No dissection  or traumatic injury. The origins of the great vessels of the aortic arch appear patent. The central pulmonary arteries appear patent. There is no cardiomegaly or pericardial effusion. There is coronary vascular calcification. There is no hilar or mediastinal adenopathy. The esophagus is grossly unremarkable. The thyroid nodule is not visualized. There is no axillary adenopathy. The chest wall soft tissues appear unremarkable. There is osteopenia. There is minimally displaced fracture of the left posterior ninth rib. Nondisplaced fractures of the left posterior tenth and eleventh ribs noted. Right shoulder rotator cuff surgical pinning noted. CT ABDOMEN AND PELVIS No intra-abdominal free air or free fluid. There stone within the gallbladder. No pericholecystic fluid. Ultrasound may provide better evaluation of the gallbladder if clinically indicated. The liver, pancreas, spleen, and the adrenal glands appear unremarkable. Multiple left renal hypodense lesions measuring up to 4.2 cm in the upper pole of the left kidney. The larger lesions measure cyst. The  smaller lesions are too small to characterize but however seen on the prior MRI and described as cysts. The right kidney is unremarkable. There is no hydronephrosis on either side. The visualized ureters and urinary bladder appear unremarkable. Prostate brachytherapy seeds noted. There is sigmoid diverticulosis without acute inflammatory changes. Constipation. There is no evidence of bowel obstruction or active inflammation. Normal appendix. The abdominal aorta and IVC appear unremarkable. No portal venous gas identified. There is no adenopathy. The abdominal wall soft tissues appear unremarkable. There is degenerative changes of the spine. Old healed left pubic bone fracture deformity. No acute fracture. IMPRESSION: Fractures of the left posterior 9-11th ribs. No pneumothorax. No other acute/traumatic intrathoracic, abdominal, or pelvic pathology identified. A 4.3  cm dilatation of the ascending aorta. Recommend annual imaging followup by CTA or MRA. This recommendation follows 2010 ACCF/AHA/AATS/ACR/ASA/SCA/SCAI/SIR/STS/SVM Guidelines for the Diagnosis and Management of Patients with Thoracic Aortic Disease. Circulation. 2010; 121: G818-H631 Electronically Signed   By: Anner Crete M.D.   On: 12/22/2015 22:39    echo: *[facility]*                         [address], [place] [address]                            [phone]  ------------------------------------------------------------------- Echocardiography  Patient:    Carlin, Attridge MR #:       [id number] Study Date: 01/30/2016 Gender:     M Age:        71 Height:     177.8 cm Weight:     83.9 kg BSA:        2.05 m^2 Pt. Status: Room:   ATTENDING    Lanelle Bal Bryan  [place] Bryan  [place] Bryan  SONOGRAPHER  [place], Tyler  cc:  ------------------------------------------------------------------- LV EF: 60% -   65%  ------------------------------------------------------------------- History:   PMH:  Former Smoker, GERD. Preoperative Clearance.  Risk factors:  Hypertension. Dyslipidemia.  ------------------------------------------------------------------- Study Conclusions  - Left ventricle: The cavity size was normal. There was moderate   concentric hypertrophy. Systolic function was normal. The   estimated ejection fraction was in the range of 60% to 65%. Wall   motion was normal; there were no regional wall motion   abnormalities. Doppler parameters are consistent with abnormal   left ventricular relaxation (grade 1 diastolic dysfunction).   Doppler parameters are consistent with indeterminate ventricular   filling pressure. - Aortic valve: Transvalvular velocity was within the normal range.   There was no  stenosis. There was mild regurgitation.   Regurgitation pressure half-time: 586 ms. - Mitral valve: Transvalvular velocity was within the normal range.   There was no evidence for stenosis. There was no regurgitation. - Right ventricle: The cavity size was normal. Wall thickness was   normal. Systolic function was normal. - Tricuspid valve: There was mild regurgitation. - Pulmonary arteries: Systolic pressure was within the normal   range. PA peak pressure: 29 mm Hg (S).  Echocardiography.  M-mode, complete 2D, spectral Doppler, and color  Doppler.  Birthdate:  Patient birthdate: 1946/03/02.  Age:  Patient is 71 yr old.  Sex:  Gender: male.    BMI: 26.5 kg/m^2.  Patient status:  Tyler.  Study date:  Study date: 01/30/2016. Study time: 08:45 AM.  Location:  Echo laboratory.  -------------------------------------------------------------------  ------------------------------------------------------------------- Left ventricle:  The cavity size was normal. There was moderate concentric hypertrophy. Systolic function was normal. The estimated ejection fraction was in the range of 60% to 65%. Wall motion was normal; there were no regional wall motion abnormalities. Doppler parameters are consistent with abnormal left ventricular relaxation (grade 1 diastolic dysfunction). Doppler parameters are consistent with indeterminate ventricular filling pressure.  ------------------------------------------------------------------- Aortic valve:   Trileaflet; normal thickness leaflets. Mobility was not restricted.  Doppler:  Transvalvular velocity was within the normal range. There was no stenosis. There was mild regurgitation.   ------------------------------------------------------------------- Aorta:  Aortic root: The aortic root was normal in size. Ascending aorta: The ascending aorta was mildly dilated.  ------------------------------------------------------------------- Mitral  valve:   Structurally normal valve.   Mobility was not restricted.  Doppler:  Transvalvular velocity was within the normal range. There was no evidence for stenosis. There was no regurgitation.  ------------------------------------------------------------------- Left atrium:  The atrium was normal in size.  ------------------------------------------------------------------- Right ventricle:  The cavity size was normal. Wall thickness was normal. Systolic function was normal.  ------------------------------------------------------------------- Pulmonic valve:    Structurally normal valve.   Cusp separation was normal.  Doppler:  Transvalvular velocity was within the normal range. There was no evidence for stenosis. There was trivial regurgitation.  ------------------------------------------------------------------- Tricuspid valve:   Structurally normal valve.    Doppler: Transvalvular velocity was within the normal range. There was mild regurgitation.  ------------------------------------------------------------------- Pulmonary artery:   The main pulmonary artery was normal-sized. Systolic pressure was within the normal range.  ------------------------------------------------------------------- Right atrium:  The atrium was normal in size.  ------------------------------------------------------------------- Pericardium:  There was no pericardial effusion.  ------------------------------------------------------------------- Systemic veins: Inferior vena cava: The vessel was normal in size. The respirophasic diameter changes were in the normal range (>= 50%), consistent with normal central venous pressure.  ------------------------------------------------------------------- Measurements   Left ventricle                           Value        Reference  LV ID, ED, PLAX chordal          (L)     34.4  mm     43 - 52  LV ID, ES, PLAX chordal          (L)     22.3  mm      23 - 38  LV fx shortening, PLAX chordal           35    %      >=29  LV PW thickness, ED                      15.4  mm     ---------  IVS/LV PW ratio, ED                      1.03         <=1.3  LV e&', lateral                           7.94  cm/s   ---------  LV E/e&',  lateral                         5.5          ---------  LV e&', medial                            4.35  cm/s   ---------  LV E/e&', medial                          10.05        ---------  LV e&', average                           6.15  cm/s   ---------  LV E/e&', average                         7.11         ---------    Ventricular septum                       Value        Reference  IVS thickness, ED                        15.9  mm     ---------    LVOT                                     Value        Reference  LVOT ID, S                               20    mm     ---------  LVOT area                                3.14  cm^2   ---------    Aortic valve                             Value        Reference  Aortic regurg pressure half-time         586   ms     ---------    Aorta                                    Value        Reference  Aortic root ID, ED                       40    mm     ---------  Ascending aorta ID, A-P                  40.7  mm     ---------    Left atrium  Value        Reference  LA ID, A-P, ES                           28    mm     ---------  LA ID/bsa, A-P                           1.37  cm/m^2 <=2.2  LA volume, ES, 1-p A4C                   37.7  ml     ---------  LA volume/bsa, ES, 1-p A4C               18.4  ml/m^2 ---------  LA volume, ES, 1-p A2C                   38.8  ml     ---------  LA volume/bsa, ES, 1-p A2C               18.9  ml/m^2 ---------    Mitral valve                             Value        Reference  Mitral E-wave peak velocity              43.7  cm/s   ---------  Mitral A-wave peak velocity              72.8  cm/s   ---------  Mitral  deceleration time         (H)     356   ms     150 - 230  Mitral E/A ratio, peak                   0.6          ---------    Pulmonary arteries                       Value        Reference  PA pressure, S, DP                       29    mm Hg  <=30    Tricuspid valve                          Value        Reference  Tricuspid regurg peak velocity           254   cm/s   ---------  Tricuspid peak RV-RA gradient            26    mm Hg  ---------    Systemic veins                           Value        Reference  Estimated CVP                            3     mm Hg  ---------    Right ventricle  Value        Reference  TAPSE                                    19.1  mm     ---------  RV pressure, S, DP                       29    mm Hg  <=30    Pulmonic valve                           Value        Reference  Pulmonic regurg velocity, ED             124   cm/s   ---------  Pulmonic regurg gradient, ED             6     mm Hg  ---------  Legend: (L)  and  (H)  mark values outside specified reference range.  ------------------------------------------------------------------- Prepared and Electronically Authenticated by  Skeet Latch, Bryan 2017-06-21T09:59:22       Recent Lab Findings: Lab Results  Component Value Date   WBC 8.5 04/11/2016   HGB 14.6 04/11/2016   HCT 43.4 04/11/2016   PLT 280.0 04/11/2016   GLUCOSE 74 04/11/2016   CHOL 181 04/11/2016   TRIG 240.0 (H) 04/11/2016   HDL 59.00 04/11/2016   LDLDIRECT 93.0 04/11/2016   LDLCALC 100 (H) 02/06/2014   ALT 20 04/11/2016   AST 21 04/11/2016   NA 140 04/11/2016   K 4.5 04/11/2016   CL 103 04/11/2016   CREATININE 0.75 04/11/2016   BUN 14 04/11/2016   CO2 31 04/11/2016   TSH 1.64 12/25/2016   INR 0.93 05/24/2013   Aortic Size Index=    4.2     /Body surface area is 2.1 meters squared. = 2.1  < 2.75 cm/m2      4% risk per year 2.75 to 4.25          8% risk per year > 4.25 cm/m2    20%  risk per year     Assessment / Plan:    4.2 cm dilatation of the ascending aorta-Was normal. Trileaflet aortic valve without sclerosis or stenosis. Healed Fractures of the left posterior 9-11th ribs  follow-up CTA of the chest one year  Patient was warned about not using Cipro and similar antibiotics. Recent studies have raised concern that fluoroquinolone antibiotics could be associated with an increased risk of aortic aneurysm Fluoroquinolones have non-antimicrobial properties that might jeopardise the integrity of the extracellular matrix of the vascular wall In a  propensity score matched cohort study in [place], there was a 66% increased rate of aortic aneurysm or dissection associated with oral fluoroquinolone use, compared with amoxicillin use, within a 60 day risk period from start of treatment     Grace Isaac Bryan      [address] [place] [address] Office [phone]   Beeper [phone]  02/12/2017 4:47 PM

## 2017-02-12 NOTE — Patient Instructions (Signed)

## 2017-02-12 NOTE — Progress Notes (Signed)
° °  Subjective:    Patient ID: Bryan Tyler, male    DOB: 1946/07/06, 71 y.o.   MRN: [id number]  HPI  Chief Complaint  Patient presents with   Follow-up    pain in rt foot; pt stated; "doing much better"     Review of Systems     Objective:   Physical Exam        Assessment & Plan:

## 2017-02-13 NOTE — Progress Notes (Signed)
Subjective:    Patient ID: Bryan Tyler, male   DOB: 71 y.o.   MRN: [id number]   HPI patient states his foot is doing much better    ROS      Objective:  Physical Exam neurovascular status intact with patient's right foot improved with discomfort still noted upon deep palpation but much better than previously     Assessment:    Improving tendinitis fasciitis-like condition     Plan:    Advised on physical therapy anti-inflammatories and supportive shoe gear. Reviewed condition and explained treatment options and at this point patient's discharge will be seen back as needed

## 2017-02-24 DIAGNOSIS — S46011D Strain of muscle(s) and tendon(s) of the rotator cuff of right shoulder, subsequent encounter: Secondary | ICD-10-CM | POA: Diagnosis not present

## 2017-02-24 DIAGNOSIS — M542 Cervicalgia: Secondary | ICD-10-CM | POA: Diagnosis not present

## 2017-02-24 DIAGNOSIS — Z4789 Encounter for other orthopedic aftercare: Secondary | ICD-10-CM | POA: Diagnosis not present

## 2017-02-27 DIAGNOSIS — S46011D Strain of muscle(s) and tendon(s) of the rotator cuff of right shoulder, subsequent encounter: Secondary | ICD-10-CM | POA: Diagnosis not present

## 2017-03-10 DIAGNOSIS — M50122 Cervical disc disorder at C5-C6 level with radiculopathy: Secondary | ICD-10-CM | POA: Diagnosis not present

## 2017-03-10 DIAGNOSIS — S46011D Strain of muscle(s) and tendon(s) of the rotator cuff of right shoulder, subsequent encounter: Secondary | ICD-10-CM | POA: Diagnosis not present

## 2017-03-10 DIAGNOSIS — M50121 Cervical disc disorder at C4-C5 level with radiculopathy: Secondary | ICD-10-CM | POA: Diagnosis not present

## 2017-03-10 DIAGNOSIS — G5601 Carpal tunnel syndrome, right upper limb: Secondary | ICD-10-CM | POA: Diagnosis not present

## 2017-03-11 ENCOUNTER — Other Ambulatory Visit: Payer: Self-pay | Admitting: Endocrinology

## 2017-03-16 ENCOUNTER — Encounter: Payer: Self-pay | Admitting: Podiatry

## 2017-03-16 ENCOUNTER — Ambulatory Visit (INDEPENDENT_AMBULATORY_CARE_PROVIDER_SITE_OTHER): Payer: Medicare Other | Admitting: Podiatry

## 2017-03-16 DIAGNOSIS — M779 Enthesopathy, unspecified: Secondary | ICD-10-CM

## 2017-03-16 DIAGNOSIS — M79676 Pain in unspecified toe(s): Secondary | ICD-10-CM

## 2017-03-16 NOTE — Progress Notes (Signed)
Subjective:    Patient ID: Bryan Tyler, male   DOB: 71 y.o.   MRN: [id number]   HPI patient states by standing on my foot to much that it still bothers me in this left foot and it burns at times and hurts    ROS      Objective:  Physical Exam neurovascular status intact with patient noted to have inflammation pain in the right plantar foot around the third metatarsal head with protrusion current of the metatarsal head     Assessment:    Inflamed area secondary to structural change     Plan:    Advised on orthotics and at this time patient is scanned for customized orthotics to reduce plantar pressure against the right foot. Patient was scanned currently

## 2017-03-18 DIAGNOSIS — S46011D Strain of muscle(s) and tendon(s) of the rotator cuff of right shoulder, subsequent encounter: Secondary | ICD-10-CM | POA: Diagnosis not present

## 2017-03-19 DIAGNOSIS — M5412 Radiculopathy, cervical region: Secondary | ICD-10-CM | POA: Diagnosis not present

## 2017-03-25 DIAGNOSIS — M5412 Radiculopathy, cervical region: Secondary | ICD-10-CM | POA: Diagnosis not present

## 2017-04-01 DIAGNOSIS — M12811 Other specific arthropathies, not elsewhere classified, right shoulder: Secondary | ICD-10-CM | POA: Diagnosis not present

## 2017-04-01 DIAGNOSIS — M75101 Unspecified rotator cuff tear or rupture of right shoulder, not specified as traumatic: Secondary | ICD-10-CM | POA: Diagnosis not present

## 2017-04-02 ENCOUNTER — Ambulatory Visit (INDEPENDENT_AMBULATORY_CARE_PROVIDER_SITE_OTHER): Payer: Medicare Other | Admitting: Orthotics

## 2017-04-02 DIAGNOSIS — M779 Enthesopathy, unspecified: Secondary | ICD-10-CM

## 2017-04-02 DIAGNOSIS — M7750 Other enthesopathy of unspecified foot: Secondary | ICD-10-CM

## 2017-04-02 NOTE — Progress Notes (Signed)
Patient came in today to pick up custom made foot orthotics.  Several modifications were made to make the F/O fit into his Ecco dress shoes; they fit fine in the athletic shoes.  Finally got satisfactory fit in both.    The goals were accomplished and the patient reported no dissatisfaction with said orthotics.  Patient was advised of breakin period and how to report any issues.  Patient was advised that Medicare didn't cover, but he signed ABN to file with Medicare anyway and then [facility] will cover.   He was advised they would be $398 if they didn't.

## 2017-04-03 DIAGNOSIS — M5412 Radiculopathy, cervical region: Secondary | ICD-10-CM | POA: Diagnosis not present

## 2017-04-06 ENCOUNTER — Encounter: Payer: Medicare Other | Admitting: Orthotics

## 2017-04-07 DIAGNOSIS — M5412 Radiculopathy, cervical region: Secondary | ICD-10-CM | POA: Diagnosis not present

## 2017-04-10 DIAGNOSIS — M5412 Radiculopathy, cervical region: Secondary | ICD-10-CM | POA: Diagnosis not present

## 2017-04-14 ENCOUNTER — Ambulatory Visit (INDEPENDENT_AMBULATORY_CARE_PROVIDER_SITE_OTHER): Payer: Medicare Other | Admitting: Endocrinology

## 2017-04-14 VITALS — BP 112/72 | HR 88 | Wt 202.6 lb

## 2017-04-14 DIAGNOSIS — M2550 Pain in unspecified joint: Secondary | ICD-10-CM | POA: Diagnosis not present

## 2017-04-14 DIAGNOSIS — I10 Essential (primary) hypertension: Secondary | ICD-10-CM | POA: Diagnosis not present

## 2017-04-14 DIAGNOSIS — R972 Elevated prostate specific antigen [PSA]: Secondary | ICD-10-CM | POA: Diagnosis not present

## 2017-04-14 DIAGNOSIS — F419 Anxiety disorder, unspecified: Secondary | ICD-10-CM | POA: Diagnosis not present

## 2017-04-14 DIAGNOSIS — E039 Hypothyroidism, unspecified: Secondary | ICD-10-CM | POA: Diagnosis not present

## 2017-04-14 DIAGNOSIS — E785 Hyperlipidemia, unspecified: Secondary | ICD-10-CM

## 2017-04-14 DIAGNOSIS — M810 Age-related osteoporosis without current pathological fracture: Secondary | ICD-10-CM

## 2017-04-14 DIAGNOSIS — E018 Other iodine-deficiency related thyroid disorders and allied conditions: Secondary | ICD-10-CM

## 2017-04-14 DIAGNOSIS — F339 Major depressive disorder, recurrent, unspecified: Secondary | ICD-10-CM | POA: Diagnosis not present

## 2017-04-14 DIAGNOSIS — Z23 Encounter for immunization: Secondary | ICD-10-CM | POA: Diagnosis not present

## 2017-04-14 LAB — CBC WITH DIFFERENTIAL/PLATELET
BASOS ABS: 0 10*3/uL (ref 0.0–0.1)
Basophils Relative: 0.5 % (ref 0.0–3.0)
EOS ABS: 0.3 10*3/uL (ref 0.0–0.7)
Eosinophils Relative: 4.9 % (ref 0.0–5.0)
HEMATOCRIT: 42.9 % (ref 39.0–52.0)
Hemoglobin: 14.3 g/dL (ref 13.0–17.0)
Lymphocytes Relative: 35.1 % (ref 12.0–46.0)
Lymphs Abs: 2.4 10*3/uL (ref 0.7–4.0)
MCHC: 33.2 g/dL (ref 30.0–36.0)
MCV: 92.1 fl (ref 78.0–100.0)
MONOS PCT: 13.2 % — AB (ref 3.0–12.0)
Monocytes Absolute: 0.9 10*3/uL (ref 0.1–1.0)
NEUTROS ABS: 3.1 10*3/uL (ref 1.4–7.7)
Neutrophils Relative %: 46.3 % (ref 43.0–77.0)
PLATELETS: 248 10*3/uL (ref 150.0–400.0)
RBC: 4.66 Mil/uL (ref 4.22–5.81)
RDW: 13.9 % (ref 11.5–15.5)
WBC: 6.8 10*3/uL (ref 4.0–10.5)

## 2017-04-14 LAB — LIPID PANEL
Cholesterol: 160 mg/dL (ref 0–200)
HDL: 43 mg/dL (ref >39.00)
NonHDL: 116.56
Total CHOL/HDL Ratio: 4
Triglycerides: 268 mg/dL — ABNORMAL HIGH (ref 0.0–149.0)
VLDL: 53.6 mg/dL — AB (ref 0.0–40.0)

## 2017-04-14 LAB — BASIC METABOLIC PANEL
BUN: 15 mg/dL (ref 6–23)
CALCIUM: 9.6 mg/dL (ref 8.4–10.5)
CHLORIDE: 102 meq/L (ref 96–112)
CO2: 29 meq/L (ref 19–32)
CREATININE: 0.8 mg/dL (ref 0.40–1.50)
GFR: 100.84 mL/min (ref >60.00)
Glucose, Bld: 90 mg/dL (ref 70–99)
Potassium: 4 mEq/L (ref 3.5–5.1)
SODIUM: 140 meq/L (ref 135–145)

## 2017-04-14 LAB — TSH: TSH: 0.41 u[IU]/mL (ref 0.35–4.50)

## 2017-04-14 LAB — SEDIMENTATION RATE: SED RATE: 17 mm/h (ref 0–20)

## 2017-04-14 LAB — LDL CHOLESTEROL, DIRECT: LDL DIRECT: 79 mg/dL

## 2017-04-14 LAB — VITAMIN D 25 HYDROXY (VIT D DEFICIENCY, FRACTURES): VITD: 21.46 ng/mL — AB (ref 30.00–100.00)

## 2017-04-14 LAB — URIC ACID: Uric Acid, Serum: 4.8 mg/dL (ref 4.0–7.8)

## 2017-04-14 LAB — PSA: PSA: 0.04 ng/mL — AB (ref 0.10–4.00)

## 2017-04-14 NOTE — Progress Notes (Signed)
we discussed code status.  pt requests full code, but would not want to be started or maintained on artificial life-support measures if there was not a reasonable chance of recovery 

## 2017-04-14 NOTE — Progress Notes (Signed)
Subjective:    Patient ID: Bryan Tyler, male    DOB: March 21, 1946, 71 y.o.   MRN: [id number]  HPI  The state of at least three ongoing medical problems is addressed today, with interval history of each noted here: Hypothyroidism: he has weight gain (20 lbs x 6 mos).  This is a stable problem.  Chronic arthralgias: pt says these persist.  This is a stable problem.  Dyslipidemia: he denies chest pain.  This is a stable problem.  Past Medical History:  Diagnosis Date  . Adenocarcinoma of prostate ([facility]) 04/16/2013  . Anxiety   . Arthritis   . Depression   . Diverticulosis   . GERD (gastroesophageal reflux disease)   . History of hyperthyroidism   . Hyperlipidemia   . Hypothyroidism, postradioiodine therapy    1980's  . Nocturia   . Organic impotence   . OSA (obstructive sleep apnea)    NO CPAP--  S/P SURGERY  2002  . Osteoporosis   . Urge urinary incontinence   . Wears contact lenses     Past Surgical History:  Procedure Laterality Date  . HEMORRHOIDECTOMY WITH HEMORRHOID BANDING  2007  . INGUINAL HERNIA REPAIR Bilateral    x4  (2 each side)  . KNEE ARTHROSCOPY Right 2012  . PROSTATE BIOPSY    . RADIOACTIVE SEED IMPLANT N/A 05/30/2013   Procedure: RADIOACTIVE SEED IMPLANT;  Surgeon: Hanley Ben, MD;  Location: [facility];  Service: Urology;  Laterality: N/A;  . ROTATOR CUFF REPAIR Right 2004  . TONSILLECTOMY  AS CHILD  . UVULOPALATOPHARYNGOPLASTY  2002   w/ septoplasty and rhinoplasty    Social History   Social History  . Marital status: Married    Spouse name: N/A  . Number of children: N/A  . Years of education: N/A   Occupational History  . Middle School Teacher    Social History Main Topics  . Smoking status: Former Smoker    Packs/day: 1.00    Years: 20.00    Types: Cigarettes    Quit date: 08/11/1988  . Smokeless tobacco: Current User  . Alcohol use No     Comment: occasionally  . Drug use: No  . Sexual activity: Not on file    Other Topics Concern  . Not on file   Social History Narrative  . No narrative on file    Current Outpatient Prescriptions on File Prior to Visit  Medication Sig Dispense Refill  . amLODipine (NORVASC) 2.5 MG tablet TAKE 1 TABLET(2.5 MG) BY MOUTH DAILY 90 tablet 0  . ARIPiprazole (ABILIFY) 5 MG tablet Take 5 mg by mouth daily.    Marland Kitchen aspirin 81 MG tablet Take 81 mg by mouth daily.    Marland Kitchen atorvastatin (LIPITOR) 40 MG tablet TAKE 1 TABLET BY MOUTH EVERY EVENING 90 tablet 0  . clonazePAM (KLONOPIN) 1 MG tablet Take 1 tablet by mouth 2 (two) times daily.  2  . diclofenac sodium (VOLTAREN) 1 % GEL Apply 4 g topically 4 (four) times daily. 100 g 2  . gabapentin (NEURONTIN) 400 MG capsule Take 400 mg by mouth 2 (two) times daily.   2  . ibuprofen (ADVIL,MOTRIN) 200 MG tablet Take 400-600 mg by mouth every 6 (six) hours as needed (For pain.).    Marland Kitchen levothyroxine (SYNTHROID, LEVOTHROID) 137 MCG tablet Take 1 tablet (137 mcg total) by mouth daily before breakfast. 90 tablet 3  . losartan (COZAAR) 100 MG tablet TAKE 1 TABLET(100 MG) BY MOUTH DAILY 90  tablet 0  . mesalamine (CANASA) 1000 MG suppository Place 1 suppository (1,000 mg total) rectally at bedtime. 30 suppository 1  . Multiple Vitamin (MULTIVITAMIN WITH MINERALS) TABS tablet Take 1 tablet by mouth daily.    Marland Kitchen omeprazole (PRILOSEC) 40 MG capsule TAKE 1 CAPSULE (40 MG TOTAL) BY MOUTH EVERY MORNING. (Patient taking differently: TAKE 1 CAPSULE (40 MG TOTAL) BY MOUTH DAILY AS NEEDED FOR HEARTBURN OR ACID REFLUX.) 90 capsule 3  . ondansetron (ZOFRAN) 4 MG tablet Take 1 tablet (4 mg total) by mouth every 8 (eight) hours as needed for nausea or vomiting. 10 tablet 0  . oxymetazoline (ANEFRIN NASAL SPRAY) 0.05 % nasal spray Place 1 spray into both nostrils at bedtime as needed for congestion.    . senna-docusate (SENOKOT-S) 8.6-50 MG tablet Take 1 tablet by mouth 2 (two) times daily. (Patient taking differently: Take 1 tablet by mouth 2 (two) times  daily as needed (For constipation.). ) 21 tablet 0  . tamsulosin (FLOMAX) 0.4 MG CAPS capsule Take 1 capsule (0.4 mg total) by mouth daily. 30 capsule 3  . venlafaxine XR (EFFEXOR-XR) 150 MG 24 hr capsule Take 300 mg by mouth daily with breakfast.     No current facility-administered medications on file prior to visit.     Allergies  Allergen Reactions  . Codeine Nausea And Vomiting and Other (See Comments)    DIZZINESS    Family History  Problem Relation Age of Onset  . Cancer Mother        Pancreatic Cancer  . Heart disease Father 41       CABG    BP 112/72   Pulse 88   Wt 202 lb 9.6 oz (91.9 kg)   SpO2 97%   BMI 29.07 kg/m   Review of Systems Denies SOB and edema.      Objective:   Physical Exam VITAL SIGNS:  See vs page. GENERAL: no distress.  NECK: There is no palpable thyroid enlargement.  No thyroid nodule is palpable.  No palpable lymphadenopathy at the anterior neck. LUNGS:  Clear to auscultation HEART:  Regular rate and rhythm without murmurs noted. Normal S1,S2.     I personally reviewed electrocardiogram tracing (today): Indication: HTN. Impression: NSR.  Suggestive of old IMI.  No hypertrophy. Compared to 2017: no significant change     Assessment & Plan:  Hypothyroidism: due for recheck.  Chronic arthralgias: pt says these persist.  This is a stable problem.  Dyslipidemia: due for recheck.    blood tests are requested for you today.  We'll let you know about the results.  Subjective:   Patient here for Medicare annual wellness visit and management of other chronic and acute problems.     Risk factors: advanced age    77 of Physicians Providing Medical Care to Patient:  See "snapshot"   Activities of Daily Living: In your present state of health, do you have any difficulty performing the following activities (lives with wife)?:  Preparing food and eating?: No  Bathing yourself: No  Getting dressed: No  Using the toilet:No  Moving  around from place to place: No  In the past year have you fallen or had a near fall?: No    Home Safety: Has smoke detector and wears seat belts. No firearms. No excess sun exposure.   Diet and Exercise  Current exercise habits: limited by arthralgias Dietary issues discussed: pt reports a healthy diet   Depression Screen  Q1: Over the past two weeks,  have you felt down, depressed or hopeless? no  Q2: Over the past two weeks, have you felt little interest or pleasure in doing things? no   The following portions of the patient's history were reviewed and updated as appropriate: allergies, current medications, past family history, past medical history, past social history, past surgical history and problem list.    Review of Systems  Denies hearing loss, and visual loss Objective:   Vision:  Advertising account executive, so he declines VA today Hearing: grossly normal Body mass index:  See vs page Msk: pt easily and quickly performs "get-up-and-go" from a sitting position.  Cognitive Impairment Assessment: cognition, memory and judgment appear normal.  remembers 3/3 at 5 minutes.  excellent recall.  can easily read and write a sentence.  alert and oriented x 3.    Assessment:   Medicare wellness utd on preventive parameters.    Plan:   During the course of the visit the patient was educated and counseled about appropriate screening and preventive services including:       Fall prevention   Diabetes screening  Nutrition counseling   Vaccines are updated as needed  Patient Instructions (the written plan) was given to the patient.

## 2017-04-14 NOTE — Patient Instructions (Addendum)
blood tests are requested for you today.  We'll let you know about the results. Please consider these measures for your health:  minimize alcohol.  Do not use tobacco products.  Have a colonoscopy at least every 10 years from age 72. Keep firearms safely stored.  Always use seat belts.  have working smoke alarms in your home.  See an eye doctor and dentist regularly.  Never drive under the influence of alcohol or drugs (including prescription drugs).  Those with fair skin should take precautions against the sun, and should carefully examine their skin once per month, for any new or changed moles. It is critically important to prevent falling down (keep floor areas well-lit, dry, and free of loose objects.  If you have a cane, walker, or wheelchair, you should use it, even for short trips around the house.  Wear flat-soled shoes.  Also, try not to rush). Please return in 1 year.

## 2017-04-15 LAB — PTH, INTACT AND CALCIUM
Calcium: 9.2 mg/dL (ref 8.6–10.3)
PTH: 45 pg/mL (ref 14–64)

## 2017-04-17 DIAGNOSIS — M5412 Radiculopathy, cervical region: Secondary | ICD-10-CM | POA: Diagnosis not present

## 2017-04-20 ENCOUNTER — Other Ambulatory Visit: Payer: Self-pay | Admitting: Endocrinology

## 2017-05-07 ENCOUNTER — Other Ambulatory Visit: Payer: Self-pay | Admitting: Endocrinology

## 2017-05-11 DIAGNOSIS — C61 Malignant neoplasm of prostate: Secondary | ICD-10-CM | POA: Diagnosis not present

## 2017-05-15 DIAGNOSIS — C61 Malignant neoplasm of prostate: Secondary | ICD-10-CM | POA: Diagnosis not present

## 2017-05-15 DIAGNOSIS — N5201 Erectile dysfunction due to arterial insufficiency: Secondary | ICD-10-CM | POA: Diagnosis not present

## 2017-05-15 DIAGNOSIS — N3943 Post-void dribbling: Secondary | ICD-10-CM | POA: Diagnosis not present

## 2017-05-19 ENCOUNTER — Encounter (HOSPITAL_COMMUNITY)
Admission: RE | Admit: 2017-05-19 | Discharge: 2017-05-19 | Disposition: A | Discharge status: Home / Self Care | Payer: Medicare Other | Source: Ambulatory Visit | Attending: Orthopedic Surgery | Admitting: Orthopedic Surgery

## 2017-05-19 ENCOUNTER — Encounter (HOSPITAL_COMMUNITY): Payer: Self-pay

## 2017-05-19 HISTORY — DX: Other specified postprocedural states: Z98.890

## 2017-05-19 HISTORY — DX: Dyspnea, unspecified: R06.00

## 2017-05-19 HISTORY — DX: Thoracic aortic aneurysm, without rupture: I71.2

## 2017-05-19 HISTORY — DX: Thoracic aortic aneurysm, without rupture, unspecified: I71.20

## 2017-05-19 HISTORY — DX: Essential (primary) hypertension: I10

## 2017-05-19 HISTORY — DX: Other specified postprocedural states: R11.2

## 2017-05-19 LAB — CBC
HEMATOCRIT: 43.4 % (ref 39.0–52.0)
Hemoglobin: 14.5 g/dL (ref 13.0–17.0)
MCH: 30.7 pg (ref 26.0–34.0)
MCHC: 33.4 g/dL (ref 30.0–36.0)
MCV: 91.9 fL (ref 78.0–100.0)
Platelets: 253 10*3/uL (ref 150–400)
RBC: 4.72 MIL/uL (ref 4.22–5.81)
RDW: 13.1 % (ref 11.5–15.5)
WBC: 7.9 10*3/uL (ref 4.0–10.5)

## 2017-05-19 LAB — BASIC METABOLIC PANEL
Anion gap: 10 (ref 5–15)
BUN: 14 mg/dL (ref 6–20)
CHLORIDE: 102 mmol/L (ref 101–111)
CO2: 25 mmol/L (ref 22–32)
Calcium: 9.5 mg/dL (ref 8.9–10.3)
Creatinine, Ser: 0.9 mg/dL (ref 0.61–1.24)
GFR calc Af Amer: >60 mL/min (ref >60)
GFR calc non Af Amer: >60 mL/min (ref >60)
GLUCOSE: 98 mg/dL (ref 65–99)
POTASSIUM: 4.4 mmol/L (ref 3.5–5.1)
Sodium: 137 mmol/L (ref 135–145)

## 2017-05-19 LAB — SURGICAL PCR SCREEN
MRSA, PCR: NEGATIVE
Staphylococcus aureus: POSITIVE — AB

## 2017-05-19 NOTE — Pre-Procedure Instructions (Signed)
TODDRICK SANNA  05/19/2017      [facility] - [place], [place] - [address] [place] [address] [place] [address] [place] [address] [place] [address] Phone: [phone] Fax: [phone]    Your procedure is scheduled on Thursday October 11.  Report to [facility] Admitting at 5:30 A.M.  Call this number if you have problems the morning of surgery:  [phone]   Remember:  Do not eat food or drink liquids after midnight.  Take these medicines the morning of surgery with A SIP OF WATER: amlodipine (norvasc), levothyroxine (synthroid), omeprazole (prilosec), venlafaxine (Effexor), aripiprazole (Abilify), zofran if needed  7 days prior to surgery STOP taking any Aspirin (unless otherwise instructed by your surgeon), Aleve, Naproxen, Ibuprofen, Motrin, Advil, Goody's, BC's, all herbal medications, fish oil, and all vitamins    Do not wear jewelry, make-up or nail polish.  Do not wear lotions, powders, or perfumes, or deoderant.  Do not shave 48 hours prior to surgery.  Men may shave face and neck.  Do not bring valuables to the hospital.  [facility] is not responsible for any belongings or valuables.  Contacts, dentures or bridgework may not be worn into surgery.  Leave your suitcase in the car.  After surgery it may be brought to your room.  For patients admitted to the hospital, discharge time will be determined by your treatment team.  Patients discharged the day of surgery will not be allowed to drive home.   Special instructions:    - Preparing For Surgery  Before surgery, you can play an important role. Because skin is not sterile, your skin needs to be as free of germs as possible. You can reduce the number of germs on your skin by washing with CHG (chlorahexidine gluconate) Soap before surgery.  CHG is an antiseptic cleaner which kills germs and bonds with the skin to continue killing germs even after  washing.  Please do not use if you have an allergy to CHG or antibacterial soaps. If your skin becomes reddened/irritated stop using the CHG.  Do not shave (including legs and underarms) for at least 48 hours prior to first CHG shower. It is OK to shave your face.  Please follow these instructions carefully.   1. Shower the NIGHT BEFORE SURGERY and the MORNING OF SURGERY with CHG.   2. If you chose to wash your hair, wash your hair first as usual with your normal shampoo.  3. After you shampoo, rinse your hair and body thoroughly to remove the shampoo.  4. Use CHG as you would any other liquid soap. You can apply CHG directly to the skin and wash gently with a scrungie or a clean washcloth.   5. Apply the CHG Soap to your body ONLY FROM THE NECK DOWN.  Do not use on open wounds or open sores. Avoid contact with your eyes, ears, mouth and genitals (private parts). Wash genitals (private parts) with your normal soap.  USE REGULAR SHAMPOO AND CONDITIONER FOR HAIR USE REGULAR SOAP FOR FACE AND PRIVATE AREA  6. Wash thoroughly, paying special attention to the area where your surgery will be performed.  7. Thoroughly rinse your body with warm water from the neck down.  8. DO NOT shower/wash with your normal soap after using and rinsing off the CHG Soap.  9. Pat yourself dry with a CLEAN TOWEL and [place] CLOTH  10. [place]  to bed the night before surgery, wear comfortable clothes the morning of surgery  11. Place CLEAN SHEETS on your bed the night of your first shower and DO NOT SLEEP WITH PETS.    Day of Surgery: Do not apply any deodorants/lotions. Please wear clean clothes to the hospital/surgery center.      Please read over the following fact sheets that you were given. Coughing and Deep Breathing and MRSA Information

## 2017-05-19 NOTE — Progress Notes (Signed)
PCP: Dr. Mena Goes @ [facility]. Follows up with Dr. Servando Snare for aortic aneurysm.

## 2017-05-20 MED ORDER — TRANEXAMIC ACID 1000 MG/10ML IV SOLN
1000.0000 mg | INTRAVENOUS | Status: AC
  Administered 2017-05-21: 1000 mg via INTRAVENOUS
  Filled 2017-05-20: qty 1100

## 2017-05-20 NOTE — Anesthesia Preprocedure Evaluation (Addendum)
Anesthesia Evaluation  Patient identified by MRN, date of birth, ID band Patient awake    Reviewed: Allergy & Precautions, H&P , NPO status , Patient's Chart, lab work & pertinent test results  History of Anesthesia Complications (+) PONV  Airway Mallampati: II  TM Distance: >3 FB Neck ROM: Full    Dental no notable dental hx. (+) Teeth Intact, Dental Advisory Given   Pulmonary neg pulmonary ROS, former smoker,    Pulmonary exam normal breath sounds clear to auscultation       Cardiovascular Exercise Tolerance: Good hypertension, Pt. on medications + Peripheral Vascular Disease   Rhythm:Regular Rate:Normal     Neuro/Psych Anxiety Depression negative neurological ROS     GI/Hepatic Neg liver ROS, GERD  Medicated and Controlled,  Endo/Other  Hypothyroidism   Renal/GU negative Renal ROS  negative genitourinary   Musculoskeletal  (+) Arthritis , Osteoarthritis,    Abdominal   Peds  Hematology negative hematology ROS (+)   Anesthesia Other Findings   Reproductive/Obstetrics negative OB ROS                            Anesthesia Physical Anesthesia Plan  ASA: II  Anesthesia Plan: General   Post-op Pain Management:  Regional for Post-op pain   Induction: Intravenous  PONV Risk Score and Plan: 3 and Ondansetron, Dexamethasone, Midazolam and Scopolamine patch - Pre-op  Airway Management Planned: Oral ETT  Additional Equipment:   Intra-op Plan:   Post-operative Plan: Extubation in OR  Informed Consent: I have reviewed the patients History and Physical, chart, labs and discussed the procedure including the risks, benefits and alternatives for the proposed anesthesia with the patient or authorized representative who has indicated his/her understanding and acceptance.   Dental advisory given  Plan Discussed with: CRNA and Surgeon  Anesthesia Plan Comments:        Anesthesia  Quick Evaluation

## 2017-05-20 NOTE — Progress Notes (Signed)
Anesthesia Chart Review: Patient is a 71 year old male scheduled for reverse right shoulder arthroplasty on 05/21/17 by Dr. Justice Britain.  History includes former smoker (quit '90), post-operative N/V, hyperlipidemia, arthritis, prostate cancer s/p radioactive seed implant '14, GERD, hyperthyroidism s/p RAI with post-RAI hypothyroidism, anxiety, depression, osteoporosis, OSA s/p UPPP '02 (no CPAP), diverticulosis, exertional dyspnea, ascending thoracic aortic aneurysm, hypertension, tonsillectomy, inguinal hernia repair.  PCP is Dr. Renato Shin. Last visit 04/14/17. CT surgeon is Dr. Lanelle Bal. Last visit 02/12/17. One year follow-up recommended (TAA f/u).  Medications include amlodipine, Abilify, aspirin 81 mg, Lipitor, clonazepam, Neurontin, Synthroid, losartan, Prilosec, Afrin nasal spray, Flomax, Effexor XR.  BP 131/79   Pulse 83   Temp 36.7 C   Resp 20   Ht 5' 10.5" (1.791 m)   Wt 200 lb 9.6 oz (91 kg)   SpO2 97%   BMI 28.38 kg/m   EKG 04/14/17: SR, inferior infarct (old, cited at least since 05/03/13).  Echo 01/30/16: Study Conclusions - Left ventricle: The cavity size was normal. There was moderate   concentric hypertrophy. Systolic function was normal. The   estimated ejection fraction was in the range of 60% to 65%. Wall   motion was normal; there were no regional wall motion   abnormalities. Doppler parameters are consistent with abnormal   left ventricular relaxation (grade 1 diastolic dysfunction).   Doppler parameters are consistent with indeterminate ventricular   filling pressure. - Aortic valve: Transvalvular velocity was within the normal range.   There was no stenosis. There was mild regurgitation.   Regurgitation pressure half-time: 586 ms. - Mitral valve: Transvalvular velocity was within the normal range.   There was no evidence for stenosis. There was no regurgitation. - Right ventricle: The cavity size was normal. Wall thickness was   normal. Systolic  function was normal. - Tricuspid valve: There was mild regurgitation. - Pulmonary arteries: Systolic pressure was within the normal   range. PA peak pressure: 29 mm Hg (S).  Remote history of stress test > 15 years ago.   CTA Chest/aorta 01/29/17: IMPRESSION: Grossly stable 4.2 cm ascending thoracic aortic aneurysm. Recommend annual imaging followup by CTA or MRA. This recommendation follows 2010 ACCF/AHA/AATS/ACR/ASA/SCA/SCAI/SIR/STS/SVM Guidelines for the Diagnosis and Management of Patients with Thoracic Aortic Disease. Circulation. 2010; 121: O122-Q825. Cholelithiasis without inflammation.  Preoperative labs noted.   EKG is stable. TAA f/u is up to date. If no acute changes then I anticipate that he can proceed as planned.   Myra Gianotti, PA-C [facility] Stay Center/Anesthesiology Phone [phone] 05/20/2017 10:14 AM

## 2017-05-21 ENCOUNTER — Encounter (HOSPITAL_COMMUNITY): Payer: Self-pay | Admitting: *Deleted

## 2017-05-21 ENCOUNTER — Inpatient Hospital Stay (HOSPITAL_COMMUNITY): Payer: Medicare Other | Admitting: Certified Registered"

## 2017-05-21 ENCOUNTER — Inpatient Hospital Stay (HOSPITAL_COMMUNITY)
Admission: RE | Admit: 2017-05-21 | Discharge: 2017-05-22 | DRG: 483 | Disposition: A | Discharge status: Home / Self Care | Payer: Medicare Other | Source: Ambulatory Visit | Attending: Orthopedic Surgery | Admitting: Orthopedic Surgery

## 2017-05-21 ENCOUNTER — Inpatient Hospital Stay (HOSPITAL_COMMUNITY): Payer: Medicare Other | Admitting: Vascular Surgery

## 2017-05-21 ENCOUNTER — Encounter (HOSPITAL_COMMUNITY)
Admission: RE | Disposition: A | Discharge status: Home / Self Care | Payer: Self-pay | Source: Ambulatory Visit | Attending: Orthopedic Surgery

## 2017-05-21 DIAGNOSIS — I719 Aortic aneurysm of unspecified site, without rupture: Secondary | ICD-10-CM | POA: Diagnosis present

## 2017-05-21 DIAGNOSIS — K219 Gastro-esophageal reflux disease without esophagitis: Secondary | ICD-10-CM | POA: Diagnosis not present

## 2017-05-21 DIAGNOSIS — Z8249 Family history of ischemic heart disease and other diseases of the circulatory system: Secondary | ICD-10-CM | POA: Diagnosis not present

## 2017-05-21 DIAGNOSIS — M12811 Other specific arthropathies, not elsewhere classified, right shoulder: Secondary | ICD-10-CM | POA: Diagnosis not present

## 2017-05-21 DIAGNOSIS — I10 Essential (primary) hypertension: Secondary | ICD-10-CM | POA: Diagnosis present

## 2017-05-21 DIAGNOSIS — F329 Major depressive disorder, single episode, unspecified: Secondary | ICD-10-CM | POA: Diagnosis present

## 2017-05-21 DIAGNOSIS — F419 Anxiety disorder, unspecified: Secondary | ICD-10-CM | POA: Diagnosis present

## 2017-05-21 DIAGNOSIS — M810 Age-related osteoporosis without current pathological fracture: Secondary | ICD-10-CM | POA: Diagnosis not present

## 2017-05-21 DIAGNOSIS — Z8546 Personal history of malignant neoplasm of prostate: Secondary | ICD-10-CM

## 2017-05-21 DIAGNOSIS — Z96619 Presence of unspecified artificial shoulder joint: Secondary | ICD-10-CM

## 2017-05-21 DIAGNOSIS — M75101 Unspecified rotator cuff tear or rupture of right shoulder, not specified as traumatic: Secondary | ICD-10-CM | POA: Diagnosis not present

## 2017-05-21 DIAGNOSIS — M19011 Primary osteoarthritis, right shoulder: Secondary | ICD-10-CM | POA: Diagnosis not present

## 2017-05-21 DIAGNOSIS — M170 Bilateral primary osteoarthritis of knee: Secondary | ICD-10-CM | POA: Diagnosis not present

## 2017-05-21 DIAGNOSIS — Z800 Family history of malignant neoplasm of digestive organs: Secondary | ICD-10-CM

## 2017-05-21 DIAGNOSIS — E890 Postprocedural hypothyroidism: Secondary | ICD-10-CM | POA: Diagnosis present

## 2017-05-21 DIAGNOSIS — Z7982 Long term (current) use of aspirin: Secondary | ICD-10-CM

## 2017-05-21 DIAGNOSIS — G8918 Other acute postprocedural pain: Secondary | ICD-10-CM | POA: Diagnosis not present

## 2017-05-21 DIAGNOSIS — Z87891 Personal history of nicotine dependence: Secondary | ICD-10-CM

## 2017-05-21 DIAGNOSIS — I739 Peripheral vascular disease, unspecified: Secondary | ICD-10-CM | POA: Diagnosis present

## 2017-05-21 DIAGNOSIS — E785 Hyperlipidemia, unspecified: Secondary | ICD-10-CM | POA: Diagnosis present

## 2017-05-21 HISTORY — DX: Pneumonia, unspecified organism: J18.9

## 2017-05-21 HISTORY — PX: REVERSE SHOULDER ARTHROPLASTY: SHX5054

## 2017-05-21 SURGERY — ARTHROPLASTY, SHOULDER, TOTAL, REVERSE
Anesthesia: General | Site: Shoulder | Laterality: Right

## 2017-05-21 MED ORDER — EPHEDRINE SULFATE-NACL 50-0.9 MG/10ML-% IV SOSY
PREFILLED_SYRINGE | INTRAVENOUS | Status: DC | PRN
  Administered 2017-05-21 (×2): 10 mg via INTRAVENOUS
  Administered 2017-05-21: 15 mg via INTRAVENOUS
  Administered 2017-05-21: 5 mg via INTRAVENOUS
  Administered 2017-05-21 (×2): 20 mg via INTRAVENOUS

## 2017-05-21 MED ORDER — HYDROMORPHONE HCL 1 MG/ML IJ SOLN: 1.0000 mg | INTRAMUSCULAR | Status: DC | PRN

## 2017-05-21 MED ORDER — CEFAZOLIN SODIUM-DEXTROSE 2-4 GM/100ML-% IV SOLN
2.0000 g | Freq: Four times a day (QID) | INTRAVENOUS | Status: AC
  Administered 2017-05-21 – 2017-05-22 (×3): 2 g via INTRAVENOUS
  Filled 2017-05-21 (×3): qty 100

## 2017-05-21 MED ORDER — ATORVASTATIN CALCIUM 40 MG PO TABS: 40.0000 mg | ORAL_TABLET | Freq: Every evening | ORAL | Status: DC

## 2017-05-21 MED ORDER — METOCLOPRAMIDE HCL 5 MG/ML IJ SOLN: 5.0000 mg | Freq: Three times a day (TID) | INTRAMUSCULAR | Status: DC | PRN

## 2017-05-21 MED ORDER — SUGAMMADEX SODIUM 200 MG/2ML IV SOLN
INTRAVENOUS | Status: DC | PRN
  Administered 2017-05-21: 170.6 mg via INTRAVENOUS

## 2017-05-21 MED ORDER — DIAZEPAM 5 MG PO TABS
2.5000 mg | ORAL_TABLET | Freq: Four times a day (QID) | ORAL | Status: DC | PRN
  Administered 2017-05-21 – 2017-05-22 (×2): 5 mg via ORAL
  Filled 2017-05-21 (×2): qty 1

## 2017-05-21 MED ORDER — MENTHOL 3 MG MT LOZG: 1.0000 | LOZENGE | OROMUCOSAL | Status: DC | PRN

## 2017-05-21 MED ORDER — LACTATED RINGERS IV SOLN
INTRAVENOUS | Status: DC | PRN
  Administered 2017-05-21 (×2): via INTRAVENOUS

## 2017-05-21 MED ORDER — METOCLOPRAMIDE HCL 5 MG PO TABS: 5.0000 mg | ORAL_TABLET | Freq: Three times a day (TID) | ORAL | Status: DC | PRN

## 2017-05-21 MED ORDER — KETOROLAC TROMETHAMINE 15 MG/ML IJ SOLN
7.5000 mg | Freq: Four times a day (QID) | INTRAMUSCULAR | Status: AC
  Administered 2017-05-21 – 2017-05-22 (×4): 7.5 mg via INTRAVENOUS
  Filled 2017-05-21 (×3): qty 1

## 2017-05-21 MED ORDER — MIDAZOLAM HCL 5 MG/5ML IJ SOLN
INTRAMUSCULAR | Status: DC | PRN
  Administered 2017-05-21: 2 mg via INTRAVENOUS

## 2017-05-21 MED ORDER — VENLAFAXINE HCL ER 150 MG PO CP24
300.0000 mg | ORAL_CAPSULE | Freq: Every day | ORAL | Status: DC
  Administered 2017-05-22: 300 mg via ORAL
  Filled 2017-05-21: qty 2

## 2017-05-21 MED ORDER — EPHEDRINE 5 MG/ML INJ
INTRAVENOUS | Status: AC
  Filled 2017-05-21: qty 10

## 2017-05-21 MED ORDER — ONDANSETRON HCL 4 MG/2ML IJ SOLN: 4.0000 mg | Freq: Four times a day (QID) | INTRAMUSCULAR | Status: DC | PRN

## 2017-05-21 MED ORDER — PROPOFOL 10 MG/ML IV BOLUS
INTRAVENOUS | Status: DC | PRN
  Administered 2017-05-21: 170 mg via INTRAVENOUS

## 2017-05-21 MED ORDER — SCOPOLAMINE 1 MG/3DAYS TD PT72
MEDICATED_PATCH | TRANSDERMAL | Status: DC | PRN
  Administered 2017-05-21: 1 via TRANSDERMAL

## 2017-05-21 MED ORDER — BUPIVACAINE-EPINEPHRINE (PF) 0.5% -1:200000 IJ SOLN
INTRAMUSCULAR | Status: DC | PRN
  Administered 2017-05-21: 30 mL via PERINEURAL

## 2017-05-21 MED ORDER — LOSARTAN POTASSIUM 50 MG PO TABS
100.0000 mg | ORAL_TABLET | Freq: Every day | ORAL | Status: DC
  Administered 2017-05-22: 100 mg via ORAL
  Filled 2017-05-21: qty 2

## 2017-05-21 MED ORDER — DEXAMETHASONE SODIUM PHOSPHATE 10 MG/ML IJ SOLN
INTRAMUSCULAR | Status: DC | PRN
  Administered 2017-05-21: 5 mg via INTRAVENOUS

## 2017-05-21 MED ORDER — ALUM & MAG HYDROXIDE-SIMETH 200-200-20 MG/5ML PO SUSP
30.0000 mL | ORAL | Status: DC | PRN
  Administered 2017-05-21: 30 mL via ORAL
  Filled 2017-05-21: qty 30

## 2017-05-21 MED ORDER — OXYCODONE HCL 5 MG PO TABS
5.0000 mg | ORAL_TABLET | ORAL | Status: DC | PRN
  Administered 2017-05-21 – 2017-05-22 (×3): 10 mg via ORAL
  Filled 2017-05-21 (×4): qty 2

## 2017-05-21 MED ORDER — PHENYLEPHRINE 40 MCG/ML (10ML) SYRINGE FOR IV PUSH (FOR BLOOD PRESSURE SUPPORT)
PREFILLED_SYRINGE | INTRAVENOUS | Status: DC | PRN
  Administered 2017-05-21: 160 ug via INTRAVENOUS
  Administered 2017-05-21: 120 ug via INTRAVENOUS

## 2017-05-21 MED ORDER — SUGAMMADEX SODIUM 200 MG/2ML IV SOLN
INTRAVENOUS | Status: AC
  Filled 2017-05-21: qty 2

## 2017-05-21 MED ORDER — CHLORHEXIDINE GLUCONATE 4 % EX LIQD: 60.0000 mL | Freq: Once | CUTANEOUS | Status: DC

## 2017-05-21 MED ORDER — HYDROMORPHONE HCL 1 MG/ML IJ SOLN: 0.2500 mg | INTRAMUSCULAR | Status: DC | PRN

## 2017-05-21 MED ORDER — LEVOTHYROXINE SODIUM 25 MCG PO TABS
137.0000 ug | ORAL_TABLET | Freq: Every day | ORAL | Status: DC
  Administered 2017-05-22: 137 ug via ORAL
  Filled 2017-05-21: qty 1

## 2017-05-21 MED ORDER — ONDANSETRON HCL 4 MG/2ML IJ SOLN
INTRAMUSCULAR | Status: DC | PRN
  Administered 2017-05-21: 4 mg via INTRAVENOUS

## 2017-05-21 MED ORDER — ONDANSETRON HCL 4 MG PO TABS: 4.0000 mg | ORAL_TABLET | Freq: Four times a day (QID) | ORAL | Status: DC | PRN

## 2017-05-21 MED ORDER — PANTOPRAZOLE SODIUM 40 MG PO TBEC: 40.0000 mg | DELAYED_RELEASE_TABLET | Freq: Every day | ORAL | Status: DC

## 2017-05-21 MED ORDER — PHENOL 1.4 % MT LIQD: 1.0000 | OROMUCOSAL | Status: DC | PRN

## 2017-05-21 MED ORDER — ACETAMINOPHEN 325 MG PO TABS
ORAL_TABLET | ORAL | Status: AC
  Administered 2017-05-21: 650 mg via ORAL
  Filled 2017-05-21: qty 2

## 2017-05-21 MED ORDER — POLYETHYLENE GLYCOL 3350 17 G PO PACK
17.0000 g | PACK | Freq: Every day | ORAL | Status: DC | PRN
  Administered 2017-05-22: 17 g via ORAL
  Filled 2017-05-21: qty 1

## 2017-05-21 MED ORDER — BISACODYL 5 MG PO TBEC: 5.0000 mg | DELAYED_RELEASE_TABLET | Freq: Every day | ORAL | Status: DC | PRN

## 2017-05-21 MED ORDER — LIDOCAINE 2% (20 MG/ML) 5 ML SYRINGE
INTRAMUSCULAR | Status: AC
  Filled 2017-05-21: qty 5

## 2017-05-21 MED ORDER — OXYCODONE HCL 5 MG PO TABS
ORAL_TABLET | ORAL | Status: AC
  Administered 2017-05-21: 10 mg via ORAL
  Filled 2017-05-21: qty 2

## 2017-05-21 MED ORDER — MAGNESIUM CITRATE PO SOLN: 1.0000 | Freq: Once | ORAL | Status: DC | PRN

## 2017-05-21 MED ORDER — DIPHENHYDRAMINE HCL 12.5 MG/5ML PO ELIX: 12.5000 mg | ORAL_SOLUTION | ORAL | Status: DC | PRN

## 2017-05-21 MED ORDER — LACTATED RINGERS IV SOLN: INTRAVENOUS | Status: DC

## 2017-05-21 MED ORDER — DOCUSATE SODIUM 100 MG PO CAPS
100.0000 mg | ORAL_CAPSULE | Freq: Two times a day (BID) | ORAL | Status: DC
  Administered 2017-05-21 – 2017-05-22 (×2): 100 mg via ORAL
  Filled 2017-05-21 (×2): qty 1

## 2017-05-21 MED ORDER — FENTANYL CITRATE (PF) 250 MCG/5ML IJ SOLN
INTRAMUSCULAR | Status: AC
  Filled 2017-05-21: qty 5

## 2017-05-21 MED ORDER — ONDANSETRON HCL 4 MG/2ML IJ SOLN
INTRAMUSCULAR | Status: AC
  Filled 2017-05-21: qty 2

## 2017-05-21 MED ORDER — KETOROLAC TROMETHAMINE 15 MG/ML IJ SOLN
INTRAMUSCULAR | Status: AC
  Administered 2017-05-21: 7.5 mg via INTRAVENOUS
  Filled 2017-05-21: qty 1

## 2017-05-21 MED ORDER — SCOPOLAMINE 1 MG/3DAYS TD PT72
MEDICATED_PATCH | TRANSDERMAL | Status: AC
  Filled 2017-05-21: qty 1

## 2017-05-21 MED ORDER — PROPOFOL 10 MG/ML IV BOLUS
INTRAVENOUS | Status: AC
  Filled 2017-05-21: qty 20

## 2017-05-21 MED ORDER — MIDAZOLAM HCL 2 MG/2ML IJ SOLN
INTRAMUSCULAR | Status: AC
  Filled 2017-05-21: qty 2

## 2017-05-21 MED ORDER — ACETAMINOPHEN 325 MG PO TABS
650.0000 mg | ORAL_TABLET | Freq: Four times a day (QID) | ORAL | Status: DC | PRN
  Administered 2017-05-21: 650 mg via ORAL

## 2017-05-21 MED ORDER — CEFAZOLIN SODIUM-DEXTROSE 2-4 GM/100ML-% IV SOLN
2.0000 g | INTRAVENOUS | Status: AC
  Administered 2017-05-21: 2 g via INTRAVENOUS
  Filled 2017-05-21: qty 100

## 2017-05-21 MED ORDER — ASPIRIN EC 81 MG PO TBEC
81.0000 mg | DELAYED_RELEASE_TABLET | Freq: Every day | ORAL | Status: DC
  Administered 2017-05-21: 81 mg via ORAL
  Filled 2017-05-21: qty 1

## 2017-05-21 MED ORDER — PHENYLEPHRINE 40 MCG/ML (10ML) SYRINGE FOR IV PUSH (FOR BLOOD PRESSURE SUPPORT)
PREFILLED_SYRINGE | INTRAVENOUS | Status: AC
  Filled 2017-05-21: qty 10

## 2017-05-21 MED ORDER — LIDOCAINE 2% (20 MG/ML) 5 ML SYRINGE
INTRAMUSCULAR | Status: DC | PRN
  Administered 2017-05-21: 60 mg via INTRAVENOUS

## 2017-05-21 MED ORDER — GABAPENTIN 400 MG PO CAPS
800.0000 mg | ORAL_CAPSULE | Freq: Every day | ORAL | Status: DC
  Administered 2017-05-21: 800 mg via ORAL
  Filled 2017-05-21: qty 2

## 2017-05-21 MED ORDER — DEXAMETHASONE SODIUM PHOSPHATE 10 MG/ML IJ SOLN
INTRAMUSCULAR | Status: AC
  Filled 2017-05-21: qty 1

## 2017-05-21 MED ORDER — 0.9 % SODIUM CHLORIDE (POUR BTL) OPTIME
TOPICAL | Status: DC | PRN
  Administered 2017-05-21: 1000 mL

## 2017-05-21 MED ORDER — ROCURONIUM BROMIDE 10 MG/ML (PF) SYRINGE
PREFILLED_SYRINGE | INTRAVENOUS | Status: DC | PRN
  Administered 2017-05-21: 10 mg via INTRAVENOUS
  Administered 2017-05-21: 60 mg via INTRAVENOUS

## 2017-05-21 MED ORDER — PHENYLEPHRINE HCL 10 MG/ML IJ SOLN
INTRAVENOUS | Status: DC | PRN
  Administered 2017-05-21: 40 ug/min via INTRAVENOUS

## 2017-05-21 MED ORDER — FENTANYL CITRATE (PF) 250 MCG/5ML IJ SOLN
INTRAMUSCULAR | Status: DC | PRN
  Administered 2017-05-21 (×2): 50 ug via INTRAVENOUS

## 2017-05-21 MED ORDER — ROCURONIUM BROMIDE 10 MG/ML (PF) SYRINGE
PREFILLED_SYRINGE | INTRAVENOUS | Status: AC
  Filled 2017-05-21: qty 5

## 2017-05-21 MED ORDER — AMLODIPINE BESYLATE 2.5 MG PO TABS
2.5000 mg | ORAL_TABLET | Freq: Every day | ORAL | Status: DC
  Administered 2017-05-22: 2.5 mg via ORAL
  Filled 2017-05-21: qty 1

## 2017-05-21 MED ORDER — ACETAMINOPHEN 650 MG RE SUPP: 650.0000 mg | Freq: Four times a day (QID) | RECTAL | Status: DC | PRN

## 2017-05-21 MED ORDER — GLYCOPYRROLATE 0.2 MG/ML IJ SOLN
INTRAMUSCULAR | Status: DC | PRN
  Administered 2017-05-21: .2 mg via INTRAVENOUS

## 2017-05-21 MED ORDER — TAMSULOSIN HCL 0.4 MG PO CAPS: 0.4000 mg | ORAL_CAPSULE | Freq: Every evening | ORAL | Status: DC

## 2017-05-21 MED ORDER — ARIPIPRAZOLE 5 MG PO TABS
10.0000 mg | ORAL_TABLET | Freq: Every day | ORAL | Status: DC
  Administered 2017-05-22: 10 mg via ORAL
  Filled 2017-05-21: qty 2

## 2017-05-21 SURGICAL SUPPLY — 59 items
BASEPLATE GLENOID SHLDR SM (Shoulder) ×2 IMPLANT
BLADE SAW SGTL 83.5X18.5 (BLADE) ×2 IMPLANT
COVER SURGICAL LIGHT HANDLE (MISCELLANEOUS) ×2 IMPLANT
CUP SUT UNIV REVERS 39 NEU (Shoulder) ×2 IMPLANT
DERMABOND ADVANCED (GAUZE/BANDAGES/DRESSINGS) ×1
DERMABOND ADVANCED .7 DNX12 (GAUZE/BANDAGES/DRESSINGS) ×1 IMPLANT
DRAPE ORTHO SPLIT 77X108 STRL (DRAPES) ×2
DRAPE SURG 17X11 SM STRL (DRAPES) ×2 IMPLANT
DRAPE SURG ORHT 6 SPLT 77X108 (DRAPES) ×2 IMPLANT
DRAPE U-SHAPE 47X51 STRL (DRAPES) ×2 IMPLANT
DRSG AQUACEL AG ADV 3.5X10 (GAUZE/BANDAGES/DRESSINGS) ×2 IMPLANT
DURAPREP 26ML APPLICATOR (WOUND CARE) ×2 IMPLANT
ELECT BLADE 4.0 EZ CLEAN MEGAD (MISCELLANEOUS) ×2
ELECT CAUTERY BLADE 6.4 (BLADE) ×2 IMPLANT
ELECT REM PT RETURN 9FT ADLT (ELECTROSURGICAL) ×2
ELECTRODE BLDE 4.0 EZ CLN MEGD (MISCELLANEOUS) ×1 IMPLANT
ELECTRODE REM PT RTRN 9FT ADLT (ELECTROSURGICAL) ×1 IMPLANT
FACESHIELD WRAPAROUND (MASK) ×6 IMPLANT
GLENOSPHERE LAT 39+4 SHOULDER (Shoulder) ×2 IMPLANT
GLOVE BIO SURGEON STRL SZ7.5 (GLOVE) ×2 IMPLANT
GLOVE BIO SURGEON STRL SZ8 (GLOVE) ×2 IMPLANT
GLOVE EUDERMIC 7 POWDERFREE (GLOVE) ×2 IMPLANT
GLOVE SS BIOGEL STRL SZ 7.5 (GLOVE) ×1 IMPLANT
GLOVE SUPERSENSE BIOGEL SZ 7.5 (GLOVE) ×1
GOWN STRL REUS W/ TWL LRG LVL3 (GOWN DISPOSABLE) ×1 IMPLANT
GOWN STRL REUS W/ TWL XL LVL3 (GOWN DISPOSABLE) ×2 IMPLANT
GOWN STRL REUS W/TWL LRG LVL3 (GOWN DISPOSABLE) ×1
GOWN STRL REUS W/TWL XL LVL3 (GOWN DISPOSABLE) ×2
INSERT HUMERAL MED 39/ +3 (Shoulder) ×1 IMPLANT
INSERT MEDIUM HUMERAL 39/ +3 (Shoulder) ×1 IMPLANT
KIT BASIN OR (CUSTOM PROCEDURE TRAY) ×2 IMPLANT
KIT ROOM TURNOVER OR (KITS) ×2 IMPLANT
MANIFOLD NEPTUNE II (INSTRUMENTS) ×2 IMPLANT
NDL SUT 6 .5 CRC .975X.05 MAYO (NEEDLE) IMPLANT
NEEDLE MAYO TAPER (NEEDLE)
NEEDLE TAPERED W/ NITINOL LOOP (MISCELLANEOUS) ×2 IMPLANT
NS IRRIG 1000ML POUR BTL (IV SOLUTION) ×2 IMPLANT
PACK SHOULDER (CUSTOM PROCEDURE TRAY) ×2 IMPLANT
PAD ARMBOARD 7.5X6 YLW CONV (MISCELLANEOUS) ×4 IMPLANT
RESTRAINT HEAD UNIVERSAL NS (MISCELLANEOUS) ×2 IMPLANT
SCREW CENTRAL NONLOCK 25MM (Screw) ×2 IMPLANT
SCREW LOCK PERIPHERAL 30MM (Shoulder) ×2 IMPLANT
SCREW LOCK PERIPHERAL 36MM (Screw) ×2 IMPLANT
SET PIN UNIVERSAL REVERSE (SET/KITS/TRAYS/PACK) ×2 IMPLANT
SLING ARM FOAM STRAP LRG (SOFTGOODS) IMPLANT
SPACER SHLD UNI REV 39 +6 (Shoulder) ×2 IMPLANT
SPONGE LAP 18X18 X RAY DECT (DISPOSABLE) ×2 IMPLANT
SPONGE LAP 4X18 X RAY DECT (DISPOSABLE) IMPLANT
STEM HUMERAL UNI REVERS SZ6 (Stem) ×2 IMPLANT
SUT FIBERWIRE #2 38 T-5 BLUE (SUTURE) ×6
SUT MNCRL AB 3-0 PS2 18 (SUTURE) ×2 IMPLANT
SUT MON AB 2-0 CT1 27 (SUTURE) ×2 IMPLANT
SUT MON AB 2-0 CT1 36 (SUTURE) ×2 IMPLANT
SUT VIC AB 1 CT1 27 (SUTURE) ×1
SUT VIC AB 1 CT1 27XBRD ANBCTR (SUTURE) ×1 IMPLANT
SUTURE FIBERWR #2 38 T-5 BLUE (SUTURE) ×3 IMPLANT
TOWEL OR 17X24 6PK STRL BLUE (TOWEL DISPOSABLE) IMPLANT
TOWEL OR 17X26 10 PK STRL BLUE (TOWEL DISPOSABLE) ×2 IMPLANT
WATER STERILE IRR 1000ML POUR (IV SOLUTION) IMPLANT

## 2017-05-21 NOTE — Discharge Instructions (Signed)

## 2017-05-21 NOTE — Anesthesia Postprocedure Evaluation (Signed)
Anesthesia Post Note  Patient: Bryan Tyler  Procedure(s) Performed: REVERSE RIGHT SHOULDER ARTHROPLASTY (Right Shoulder)     Patient location during evaluation: PACU Anesthesia Type: General and Regional Level of consciousness: awake and alert Pain management: pain level controlled Vital Signs Assessment: post-procedure vital signs reviewed and stable Respiratory status: spontaneous breathing, nonlabored ventilation and respiratory function stable Cardiovascular status: blood pressure returned to baseline and stable Postop Assessment: no apparent nausea or vomiting Anesthetic complications: no    Last Vitals:  Vitals:   05/21/17 1100 05/21/17 1145  BP:  111/72  Pulse: 73 69  Resp: 20 20  Temp:    SpO2: 99% 97%    Last Pain:  Vitals:   05/21/17 0945  TempSrc:   PainSc: 0-No pain                 Lamont Tant,W. EDMOND

## 2017-05-21 NOTE — Transfer of Care (Signed)
Immediate Anesthesia Transfer of Care Note  Patient: [facility]  Procedure(s) Performed: REVERSE RIGHT SHOULDER ARTHROPLASTY (Right Shoulder)  Patient Location: PACU  Anesthesia Type:GA combined with regional for post-op pain  Level of Consciousness: drowsy and patient cooperative  Airway & Oxygen Therapy: Patient Spontanous Breathing and Patient connected to face mask oxygen  Post-op Assessment: Report given to RN and Post -op Vital signs reviewed and stable  Post vital signs: Reviewed and stable  Last Vitals:  Vitals:   05/21/17 0547 05/21/17 0944  BP: 121/79   Pulse: 69   Resp: 19   Temp: 36.6 C (!) 36.1 C  SpO2: 98%     Last Pain:  Vitals:   05/21/17 0944  TempSrc: Tympanic         Complications: No apparent anesthesia complications

## 2017-05-21 NOTE — Anesthesia Procedure Notes (Signed)
Procedure Name: Intubation Date/Time: 05/21/2017 7:42 AM Performed by: Freddie Breech Pre-anesthesia Checklist: Patient identified, Emergency Drugs available, Suction available and Patient being monitored Patient Re-evaluated:Patient Re-evaluated prior to induction Oxygen Delivery Method: Circle System Utilized Preoxygenation: Pre-oxygenation with 100% oxygen Induction Type: IV induction Ventilation: Mask ventilation without difficulty Grade View: Grade III Tube type: Oral Tube size: 7.5 mm Number of attempts: 1 Airway Equipment and Method: Stylet and Oral airway Placement Confirmation: ETT inserted through vocal cords under direct vision,  positive ETCO2 and breath sounds checked- equal and bilateral Secured at: 23 cm Tube secured with: Tape Dental Injury: Teeth and Oropharynx as per pre-operative assessment

## 2017-05-21 NOTE — H&P (Signed)
[facility]    Chief Complaint: Right Rotator Cuff Tear Arthropathy HPI: The patient is a 71 y.o. male with end stage right shoulder rotator cuff tear arthropathy  Past Medical History:  Diagnosis Date  . Adenocarcinoma of prostate ([place]) 04/16/2013  . Aneurysm, aorta, thoracic ([place])   . Anxiety   . Arthritis   . Depression   . Diverticulosis   . Dyspnea    on exertion for a long period time  . GERD (gastroesophageal reflux disease)   . History of hyperthyroidism   . Hyperlipidemia   . Hypertension   . Hypothyroidism, postradioiodine therapy    1980's  . Nocturia   . Organic impotence   . OSA (obstructive sleep apnea)    NO CPAP--  S/P SURGERY  2002  . Osteoporosis   . PONV (postoperative nausea and vomiting)   . Urge urinary incontinence   . Wears contact lenses     Past Surgical History:  Procedure Laterality Date  . HEMORRHOIDECTOMY WITH HEMORRHOID BANDING  2007  . INGUINAL HERNIA REPAIR Bilateral    x4  (2 each side)  . KNEE ARTHROSCOPY Right 2012  . PROSTATE BIOPSY    . RADIOACTIVE SEED IMPLANT N/A 05/30/2013   Procedure: RADIOACTIVE SEED IMPLANT;  Surgeon: Hanley Ben, MD;  Location: [facility];  Service: Urology;  Laterality: N/A;  . ROTATOR CUFF REPAIR Right 2004  . ROTATOR CUFF REPAIR Left 2016  . TONSILLECTOMY  AS CHILD  . UVULOPALATOPHARYNGOPLASTY  2002   w/ septoplasty and rhinoplasty    Family History  Problem Relation Age of Onset  . Cancer Mother        Pancreatic Cancer  . Heart disease Father 53       CABG    Social History:  reports that he quit smoking about 28 years ago. His smoking use included Cigarettes. He has a 20.00 pack-year smoking history. He has quit using smokeless tobacco. He reports that he does not drink alcohol or use drugs.   Medications Prior to Admission  Medication Sig Dispense Refill  . amLODipine (NORVASC) 2.5 MG tablet TAKE 1 TABLET(2.5 MG) BY MOUTH DAILY 90 tablet 0  . ARIPiprazole (ABILIFY)  10 MG tablet Take 10 mg by mouth daily.     Marland Kitchen aspirin 81 MG tablet Take 81 mg by mouth at bedtime.     Marland Kitchen atorvastatin (LIPITOR) 40 MG tablet TAKE 1 TABLET BY MOUTH EVERY EVENING (Patient taking differently: TAKE 40 MG BY MOUTH EVERY EVENING) 90 tablet 0  . clonazePAM (KLONOPIN) 1 MG tablet Take 1 tablet by mouth at bedtime.   2  . gabapentin (NEURONTIN) 400 MG capsule Take 800 mg by mouth at bedtime.   2  . ibuprofen (ADVIL,MOTRIN) 200 MG tablet Take 400 mg by mouth every 6 (six) hours as needed for headache or moderate pain.     Marland Kitchen levothyroxine (SYNTHROID, LEVOTHROID) 137 MCG tablet Take 1 tablet (137 mcg total) by mouth daily before breakfast. 90 tablet 3  . losartan (COZAAR) 100 MG tablet TAKE 1 TABLET(100 MG) BY MOUTH DAILY 90 tablet 0  . Multiple Vitamin (MULTIVITAMIN WITH MINERALS) TABS tablet Take 1 tablet by mouth daily.    Marland Kitchen oxymetazoline (ANEFRIN NASAL SPRAY) 0.05 % nasal spray Place 1 spray into both nostrils as needed for congestion.     . senna-docusate (SENOKOT-S) 8.6-50 MG tablet Take 1 tablet by mouth 2 (two) times daily. (Patient taking differently: Take 2 tablets by mouth 2 (two) times daily  as needed for moderate constipation. ) 21 tablet 0  . tamsulosin (FLOMAX) 0.4 MG CAPS capsule Take 1 capsule (0.4 mg total) by mouth daily. (Patient taking differently: Take 0.4 mg by mouth every evening. ) 30 capsule 3  . venlafaxine XR (EFFEXOR-XR) 150 MG 24 hr capsule Take 300 mg by mouth daily with breakfast.    . diclofenac sodium (VOLTAREN) 1 % GEL Apply 4 g topically 4 (four) times daily. (Patient not taking: Reported on 05/14/2017) 100 g 2  . mesalamine (CANASA) 1000 MG suppository Place 1 suppository (1,000 mg total) rectally at bedtime. (Patient not taking: Reported on 05/14/2017) 30 suppository 1  . omeprazole (PRILOSEC) 40 MG capsule TAKE 1 CAPSULE (40 MG TOTAL) BY MOUTH EVERY MORNING. (Patient taking differently: TAKE 1 CAPSULE (40 MG TOTAL) BY MOUTH DAILY AS NEEDED FOR HEARTBURN OR  ACID REFLUX.) 90 capsule 3  . ondansetron (ZOFRAN) 4 MG tablet Take 1 tablet (4 mg total) by mouth every 8 (eight) hours as needed for nausea or vomiting. (Patient not taking: Reported on 05/14/2017) 10 tablet 0     Physical Exam: right shoulder with  Painful and restricted motion as noted at recent office visits  Vitals  Temp:  [97.8 F (36.6 C)] 97.8 F (36.6 C) (10/11 0547) Pulse Rate:  [69] 69 (10/11 0547) Resp:  [19] 19 (10/11 0547) BP: (121)/(79) 121/79 (10/11 0547) SpO2:  [98 %] 98 % (10/11 0547) Weight:  [85.3 kg (188 lb)] 85.3 kg (188 lb) (10/11 7412)  Assessment/Plan  Impression: Right Rotator Cuff Tear Arthropathy  Plan of Action: Procedure(s): REVERSE RIGHT SHOULDER ARTHROPLASTY  [facility] M Jaqulyn Chancellor 05/21/2017, 7:25 AM Contact # [phone]

## 2017-05-21 NOTE — Anesthesia Procedure Notes (Signed)
Anesthesia Regional Block: Interscalene brachial plexus block   Pre-Anesthetic Checklist: ,, timeout performed, Correct Patient, Correct Site, Correct Laterality, Correct Procedure, Correct Position, site marked, Risks and benefits discussed, pre-op evaluation,  At surgeon's request and post-op pain management  Laterality: Right  Prep: Maximum Sterile Barrier Precautions used, chloraprep       Needles:  Injection technique: Single-shot  Needle Type: Echogenic Stimulator Needle     Needle Length: 5cm  Needle Gauge: 22     Additional Needles:   Procedures:, nerve stimulator,,, ultrasound used (permanent image in chart),,,,   Nerve Stimulator or Paresthesia:  Response: Biceps response,   Additional Responses:   Narrative:  Start time: 05/21/2017 7:00 AM End time: 05/21/2017 7:10 AM Injection made incrementally with aspirations every 5 mL. Anesthesiologist: Roderic Palau  Additional Notes: 2% Lidocaine skin wheel.

## 2017-05-21 NOTE — Op Note (Signed)
05/21/2017  9:40 AM  PATIENT:   [facility]  71 y.o. male  PRE-OPERATIVE DIAGNOSIS:  Right Rotator Cuff Tear Arthropathy  POST-OPERATIVE DIAGNOSIS:  same  PROCEDURE:  R RSA #6 stem, +6 spacer, +3 poly, small baseplate, 39/+4 glenosphere  SURGEON:  Sieara Bremer, Metta Clines M.D.  ASSISTANTS: Shuford pac   ANESTHESIA:   GET + ISB  EBL: 250  SPECIMEN:  none  Drains: none   PATIENT DISPOSITION:  PACU - hemodynamically stable.    PLAN OF CARE: Admit for overnight observation  Dictation# [id number]   Contact # [phone]

## 2017-05-22 ENCOUNTER — Encounter (HOSPITAL_COMMUNITY): Payer: Self-pay | Admitting: Orthopedic Surgery

## 2017-05-22 MED ORDER — NAPROXEN 500 MG PO TABS: 500.0000 mg | ORAL_TABLET | Freq: Two times a day (BID) | ORAL | 1 refills | Status: DC

## 2017-05-22 MED ORDER — DIAZEPAM 5 MG PO TABS: 2.5000 mg | ORAL_TABLET | Freq: Four times a day (QID) | ORAL | 1 refills | Status: DC | PRN

## 2017-05-22 MED ORDER — OXYCODONE HCL 5 MG PO TABS: 5.0000 mg | ORAL_TABLET | ORAL | 0 refills | Status: DC | PRN

## 2017-05-22 NOTE — Therapy (Signed)
Occupational Therapy Evaluation and Discharge  Patient Details Name: [facility] MRN: [id number] DOB: 10-01-1945 Today's Date: 05/22/2017    History of Present Illness Pt is a 72 y.o. male s/p reverse right shoulder arthroplasty. PMH: adenocarcinoma of prostate, thoracic aorta aneurysm, anxiety, arthritis, depression, diverticulosis, dyspnea, GERD, hyperlipidemia, HTN, nocturia, organic impotence, OSA, osteoporosis, PONV.    Clinical Impression   Pt reports being independent in ADLs, IADLs, works part time, and enjoys working in his yard PTA. Currently, pt requires min assist for UB dressing/sling management and positioning of RUE and supervision during functional mobility for safety. Pt reports wife is available 24/7 to assist as needed upon d/c. Pt completed all acute education and exercises per active shoulder protocol. Pt safe to d/c with supervision from family. No further acute OT needs at this time. OT signing off.     Follow Up Recommendations  DC plan and follow up therapy as arranged by surgeon;Supervision - Intermittent    Equipment Recommendations  None recommended by OT    Recommendations for Other Services       Precautions / Restrictions Precautions Precautions: Shoulder Type of Shoulder Precautions: Active Protocol-  OK to use operative arm for feeding, hygiene, and ADLs. OK pendulums and lap slides. For ADLs only PROM, AAROM, AROM withing ER 20, ABD 45, FF 60.  Shoulder Interventions: Shoulder sling/immobilizer;Off for dressing/bathing/exercises Precaution Booklet Issued: Yes (comment) Precaution Comments: Reviewed precautions with pt  Required Braces or Orthoses: Sling (on while sleeping. OK to take off while sitting ) Restrictions Weight Bearing Restrictions: Yes RUE Weight Bearing: Non weight bearing      Mobility Bed Mobility Overal bed mobility: Needs Assistance Bed Mobility: Supine to Sit     Supine to sit: Supervision     General bed mobility  comments: Supervision for safety and to maintain NWB in RUE   Transfers Overall transfer level: Needs assistance Equipment used: None Transfers: Sit to/from Stand Sit to Stand: Supervision         General transfer comment: Supervision for safety and to maintain NWB in RUE     Balance Overall balance assessment: Needs assistance Sitting-balance support: No upper extremity supported;Feet supported Sitting balance-Leahy Scale: Good Sitting balance - Comments: Pt able to don LB clothing sitting EOB with no LOB   Standing balance support: No upper extremity supported;During functional activity Standing balance-Leahy Scale: Good Standing balance comment: Pt able to complete pendulum exercises in standing with no LOB                            ADL either performed or assessed with clinical judgement   ADL Overall ADL's : Needs assistance/impaired                     Lower Body Dressing: Minimal assistance;Sit to/from stand Lower Body Dressing Details (indicate cue type and reason): Pt able to don LB clothing with min assist to adjust waist band due to restricted ER of RUE              Functional mobility during ADLs: Supervision/safety;Set up General ADL Comments: Pt educated on compensatory strategies to complete ADLs with shoulder precautions. See shoulder section below.      Vision Baseline Vision/History: Wears glasses Wears Glasses: At all times       Perception     Praxis      Pertinent Vitals/Pain Pain Assessment: 0-10 Pain Score: 1  Pain Location: Right  shoulder Pain Descriptors / Indicators: Aching;Sore Pain Intervention(s): Monitored during session;Ice applied     Hand Dominance Right   Extremity/Trunk Assessment Upper Extremity Assessment Upper Extremity Assessment: RUE deficits/detail RUE Deficits / Details: s/p right reverse total shoulder arthroplasty  RUE: Unable to fully assess due to immobilization   Lower Extremity  Assessment Lower Extremity Assessment: Overall WFL for tasks assessed   Cervical / Trunk Assessment Cervical / Trunk Assessment: Normal   Communication Communication Communication: No difficulties   Cognition Arousal/Alertness: Awake/alert Behavior During Therapy: WFL for tasks assessed/performed Overall Cognitive Status: Within Functional Limits for tasks assessed                                     General Comments       Exercises Exercises: Shoulder Shoulder Exercises Pendulum Exercise: PROM;Right;10 reps;Standing Elbow Flexion: AROM;Right;10 reps;Seated Elbow Extension: AROM;Right;10 reps;Seated Wrist Flexion: AROM;Right;10 reps;Seated Wrist Extension: AROM;Right;10 reps;Seated Digit Composite Flexion: AROM;Right;10 reps;Seated Composite Extension: AROM;Right;10 reps;Seated   Shoulder Instructions Shoulder Instructions Donning/doffing shirt without moving shoulder: Minimal assistance Method for sponge bathing under operated UE: Supervision/safety;Set-up Donning/doffing sling/immobilizer: Minimal assistance Correct positioning of sling/immobilizer: Minimal assistance Pendulum exercises (written home exercise program): Supervision/safety;Set-up ROM for elbow, wrist and digits of operated UE: Supervision/safety;Set-up Sling wearing schedule (on at all times/off for ADL's): Supervision/safety;Set-up Proper positioning of operated UE when showering: Supervision/safety;Set-up Positioning of UE while sleeping: Minimal assistance    Home Living Family/patient expects to be discharged to:: Private residence Living Arrangements: Spouse/significant other Available Help at Discharge: Family;Available 24 hours/day Type of Home: House Home Access: Stairs to enter [employer] of Steps: 3   Home Layout: Two level Alternate Level Stairs-Number of Steps: 12 Alternate Level Stairs-Rails: Right;Left;Can reach both Bathroom Shower/Tub: Walk-in shower;Door    [employer] Toilet: Standard     Home Equipment: None          Prior Functioning/Environment Level of Independence: Independent        Comments: 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 Pt also enjoys working in his yard.         OT Problem List: Impaired UE functional use;Decreased knowledge of precautions      OT Treatment/Interventions:      OT Goals(Current goals can be found in the care plan section) Acute Rehab OT Goals Patient Stated Goal: To go home  OT Goal Formulation: With patient  OT Frequency:     Barriers to D/C:            Co-evaluation              AM-PAC PT "6 Clicks" Daily Activity     Outcome Measure Help from another person eating meals?: None Help from another person taking care of personal grooming?: None Help from another person toileting, which includes using toliet, bedpan, or urinal?: None Help from another person bathing (including washing, rinsing, drying)?: None Help from another person to put on and taking off regular upper body clothing?: A Little Help from another person to put on and taking off regular lower body clothing?: A Little 6 Click Score: 22   End of Session Equipment Utilized During Treatment: Gait belt Nurse Communication: Mobility status;Weight bearing status  Activity Tolerance: Patient tolerated treatment well Patient left: in chair;with call bell/phone within reach  OT Visit Diagnosis: Other abnormalities of gait and mobility (R26.89)  Time: 2567-2091 OT Time Calculation (min): 33 min Charges:  OT General Charges $OT Visit: 1 Visit OT Evaluation $OT Eval Moderate Complexity: 1 Mod OT Treatments $Self Care/Home Management : 8-22 mins G-Codes:     Boykin Peek, OTS [phone]  Boykin Peek 05/22/2017, 10:52 AM

## 2017-05-22 NOTE — Op Note (Signed)
NAME:  [facility], [facility]                  ACCOUNT NO.:  MEDICAL RECORD NO.:  [id number]  LOCATION:                                 FACILITY:  PHYSICIAN:  Metta Clines. Aldeen Riga, M.D.       DATE OF BIRTH:  DATE OF PROCEDURE:  05/21/2017 DATE OF DISCHARGE:                              OPERATIVE REPORT   PREOPERATIVE DIAGNOSIS:  End-stage right shoulder rotator cuff tear arthropathy.  POSTOPERATIVE DIAGNOSIS:  End-stage right shoulder rotator cuff tear arthropathy.  PROCEDURE:  A right shoulder reverse arthroplasty utilizing a press-fit size 6 Arthrex stem, +6 spacer, +3 poly, small baseplate, 39/+4 glenosphere.  SURGEON:  Metta Clines. Bairon Klemann, MD.  Terrence DupontReather Laurence. Shuford, PA-C.  ANESTHESIA:  General endotracheal as well as interscalene block.  ESTIMATED BLOOD LOSS:  150 mL.  DRAINS:  None.  HISTORY:  Mr. Discher is a 71 year old gentleman who has had chronic and progressive increasing right shoulder pain related to end-stage rotator cuff tear arthropathy.  He has had multiple previous attempts at rotator cuff repair.  Now has clinical evidence for profound loss of shoulder mobility, strength, and function with significant pain and radiographic changes consistent with end-stage rotator cuff tear arthropathy.  He was brought to the operating room at this time for planned right shoulder reverse arthroplasty as described below.  Preoperatively, I counseled Mr. Garringer regarding treatment options and potential risks versus benefits thereof.  Possible surgical complications were all reviewed including bleeding, infection, neurovascular injury, persistent pain, loss of motion, anesthetic complication, failure of the implant, and possible need for additional surgery.  He understands and accepts and agrees with our planned procedure.  PROCEDURE IN DETAIL:  After undergoing routine preop evaluation, the patient received prophylactic antibiotics.  An interscalene block was established  in the holding area by Anesthesia Department.  Placed supine on the operating table and underwent smooth induction of a general endotracheal anesthesia.  Placed in the beach-chair position and appropriately padded and protected.  Right shoulder girdle region was sterilely prepped and draped in a standard fashion.  Time-out was called.  An anterior deltopectoral approach was made through an approximately 8-cm incision.  Skin flaps were elevated.  Dissection was carried deeply.  Electrocautery was used for hemostasis.  The deltopectoral interval was then developed from proximal and distal, vein taken laterally.  The upper centimeter of the pec major was tenotomized to enhance exposure.  The conjoined tendon mobilized, retracted medially.  We then divided the subscapularis away from its attachment on the lesser tuberosity.  The free margin was tagged for later repair and then divided the capsular attachments from the anteroinferior and inferior aspects of the humeral neck.  The humeral head was then delivered through the wound.  We outlined our proposed humeral head resection with the extramedullary guide.  The head was resected with an oscillating saw and then we removed osteophytes from the margin of the humeral neck.  Metal cap was placed over the cut surface of the proximal humerus.  We then exposed the glenoid with a combination of Fukuda, pitchfork, and snake tongue retractors.  There had been previous biceps tenotomy.  I removed the remnant of  the glenoid labrum circumferentially, gained complete visualization of the periphery of the glenoid.  A central guidepin was then placed into the glenoid.  We reamed +2 and then used our peripheral reamer obtaining an excellent bony bed for baseplate.  Baseplate was then impacted.  A central lag screw followed by superior and inferior locking screws was placed, all obtaining excellent purchase and fixation.  We then reamed the margin of the  glenoid, irrigated the space and then implanted the 39+/4 glenosphere.  I should mention that to gain proper access I did take an additional 2-mm resection off the humeral metaphyseal region for better soft tissue balance and exposure.  Once the glenosphere was impacted on the baseplate, we confirmed its stability and then returned our attention back to the proximal humerus.  The canal was then hand reamed and we broached up to a size 6 and the canal was very narrow distally, so the size 6 gave [place] excellent fit and fixation.  We then used the central reaming post and prepared the proximal humeral metaphyseal region with a 39 reamer.  We assembled a trial and trial reduction was performed showing good soft tissue balance, good shoulder motion and stability.  Our trial implant was removed.  We then assembled our final implants on the back table and then this was impacted into position with excellent fit and fixation.  We then performed a series of trial reductions and ultimately felt that a total of +9 extension off our implant was appropriate given excellent soft tissue balance.  We then assembled a +6 spacer and then a +3 poly on our final construct.  Final reduction was performed and this showed excellent soft tissue balance with good stability and excellent motion.  I then repaired the subscapularis back to the metaphysis of the proximal humerus through the islets on our stem.  This then allowed easily 45 degrees of external rotation without excessive tension on the subscap repair.  The wound was then copiously irrigated.  I should note that there was a violation of the cephalic vein noted such that ligation of the vein was necessary to achieve hemostasis.  The wound was then irrigated.  The deltopectoral interval was then closed with interrupted figure-of-eight #1 Vicryl sutures.  2-0 Vicryl was used for the subcu layer, intracuticular 3-0 Monocryl for the skin followed by Dermabond and  Aquacel dressing.  Right arm was placed into a sling.  The patient was then awakened, extubated and taken to the recovery room in stable condition.  Jenetta Loges, PA-C, was used as an Environmental consultant throughout this case, was essential for help with positioning the patient, positioning the extremity, tissue manipulation, suture management, wound closure, and intraoperative decision making.     Metta Clines. Jahden Schara, M.D.     KMS/MEDQ  D:  05/21/2017  T:  05/21/2017  Job:  [id number]

## 2017-05-22 NOTE — Progress Notes (Signed)
Pt ready for discharge. Education/instructions reviewed with pt and wife, and all questions/concerns addressed. IV removed, prescriptions given, and belongings gathered. Pt will be transported out via wheelchair to wife's vehicle.

## 2017-05-22 NOTE — Discharge Summary (Signed)
PATIENT ID:      Bryan Tyler  MRN:     [id number] DOB/AGE:    Apr 28, 1946 / 71 y.o.     DISCHARGE SUMMARY  ADMISSION DATE:    05/21/2017 DISCHARGE DATE:    ADMISSION DIAGNOSIS: Right Rotator Cuff Tear Arthropathy Past Medical History:  Diagnosis Date  . Adenocarcinoma of prostate ([place]) 04/16/2013  . Aneurysm, aorta, thoracic ([place])   . Anxiety   . Arthritis    "knees" (05/21/2017)  . Depression   . Diverticulosis   . Dyspnea    on exertion for a long period time  . GERD (gastroesophageal reflux disease)   . Hyperlipidemia   . Hypertension   . Hypothyroidism, postradioiodine therapy    1980's  . Nocturia   . Organic impotence   . OSA (obstructive sleep apnea)    NO CPAP--  S/P SURGERY  2002  . Osteoporosis   . Pneumonia 1970  . PONV (postoperative nausea and vomiting)   . Urge urinary incontinence   . Wears contact lenses     DISCHARGE DIAGNOSIS:   Active Problems:   S/p reverse total shoulder arthroplasty   PROCEDURE: Procedure(s): REVERSE RIGHT SHOULDER ARTHROPLASTY on 05/21/2017  CONSULTS:    HISTORY:  See H&P in chart.  HOSPITAL COURSE:  Bryan Tyler is a 71 y.o. admitted on 05/21/2017 with a diagnosis of Right Rotator Cuff Tear Arthropathy.  They were brought to the operating room on 05/21/2017 and underwent Procedure(s): REVERSE RIGHT SHOULDER ARTHROPLASTY.    They were given perioperative antibiotics: Anti-infectives    Start     Dose/Rate Route Frequency Ordered Stop   05/21/17 1400  ceFAZolin (ANCEF) IVPB 2g/100 mL premix     2 g 200 mL/hr over 30 Minutes Intravenous Every 6 hours 05/21/17 0953 05/22/17 0230   05/21/17 0545  ceFAZolin (ANCEF) IVPB 2g/100 mL premix     2 g 200 mL/hr over 30 Minutes Intravenous On call to O.R. 05/21/17 8850 05/21/17 0753    .  Patient underwent the above named procedure and tolerated it well. The following day they were hemodynamically stable and pain was controlled on oral analgesics. They were neurovascularly  intact to the operative extremity. OT was ordered and worked with patient per protocol. They were medically and orthopaedically stable for discharge on day 1.   DIAGNOSTIC STUDIES:  RECENT RADIOGRAPHIC STUDIES :  No results found.  RECENT VITAL SIGNS:  Patient Vitals for the past 24 hrs:  BP Temp Temp src Pulse Resp SpO2  05/22/17 0433 (!) 130/56 97.9 F (36.6 C) Oral 68 16 98 %  05/22/17 0038 120/62 98.9 F (37.2 C) Oral 70 18 99 %  05/21/17 2139 117/60 98.1 F (36.7 C) Oral 77 17 98 %  05/21/17 1300 139/84 98 F (36.7 C) - 90 18 100 %  05/21/17 1145 111/72 - - 69 20 97 %  05/21/17 1100 - - - 73 20 99 %  05/21/17 1045 124/73 - - 68 17 99 %  05/21/17 1030 116/81 - - 70 17 98 %  05/21/17 1015 116/76 - - 83 15 98 %  05/21/17 1000 116/74 - - 82 18 96 %  05/21/17 0945 115/67 - - 87 (!) 21 100 %  05/21/17 0944 - (!) 97 F (36.1 C) Tympanic - - -  .  RECENT EKG RESULTS:    Orders placed or performed in visit on 04/14/17  . EKG 12-Lead    DISCHARGE INSTRUCTIONS:    DISCHARGE MEDICATIONS:  Allergies as of 05/22/2017      Reactions   Codeine Nausea And Vomiting, Other (See Comments)   DIZZINESS      Medication List    STOP taking these medications   ibuprofen 200 MG tablet Commonly known as:  ADVIL,MOTRIN     TAKE these medications   amLODipine 2.5 MG tablet Commonly known as:  NORVASC TAKE 1 TABLET(2.5 MG) BY MOUTH DAILY   ANEFRIN NASAL SPRAY 0.05 % nasal spray Generic drug:  oxymetazoline Place 1 spray into both nostrils as needed for congestion.   ARIPiprazole 10 MG tablet Commonly known as:  ABILIFY Take 10 mg by mouth daily.   aspirin 81 MG tablet Take 81 mg by mouth at bedtime.   atorvastatin 40 MG tablet Commonly known as:  LIPITOR TAKE 1 TABLET BY MOUTH EVERY EVENING What changed:  See the new instructions.   clonazePAM 1 MG tablet Commonly known as:  KLONOPIN Take 1 tablet by mouth at bedtime.   diazepam 5 MG tablet Commonly known as:   VALIUM Take 0.5-1 tablets (2.5-5 mg total) by mouth every 6 (six) hours as needed for muscle spasms or sedation.   diclofenac sodium 1 % Gel Commonly known as:  VOLTAREN Apply 4 g topically 4 (four) times daily.   gabapentin 400 MG capsule Commonly known as:  NEURONTIN Take 800 mg by mouth at bedtime.   levothyroxine 137 MCG tablet Commonly known as:  SYNTHROID, LEVOTHROID Take 1 tablet (137 mcg total) by mouth daily before breakfast.   losartan 100 MG tablet Commonly known as:  COZAAR TAKE 1 TABLET(100 MG) BY MOUTH DAILY   mesalamine 1000 MG suppository Commonly known as:  CANASA Place 1 suppository (1,000 mg total) rectally at bedtime.   multivitamin with minerals Tabs tablet Take 1 tablet by mouth daily.   naproxen 500 MG tablet Commonly known as:  NAPROSYN Take 1 tablet (500 mg total) by mouth 2 (two) times daily with a meal.   omeprazole 40 MG capsule Commonly known as:  PRILOSEC TAKE 1 CAPSULE (40 MG TOTAL) BY MOUTH EVERY MORNING. What changed:  See the new instructions.   ondansetron 4 MG tablet Commonly known as:  ZOFRAN Take 1 tablet (4 mg total) by mouth every 8 (eight) hours as needed for nausea or vomiting.   oxyCODONE 5 MG immediate release tablet Commonly known as:  ROXICODONE Take 1 tablet (5 mg total) by mouth every 4 (four) hours as needed for severe pain.   senna-docusate 8.6-50 MG tablet Commonly known as:  Senokot-S Take 1 tablet by mouth 2 (two) times daily. What changed:  how much to take  when to take this  reasons to take this   tamsulosin 0.4 MG Caps capsule Commonly known as:  FLOMAX Take 1 capsule (0.4 mg total) by mouth daily. What changed:  when to take this   venlafaxine XR 150 MG 24 hr capsule Commonly known as:  EFFEXOR-XR Take 300 mg by mouth daily with breakfast.       FOLLOW UP VISIT:   Follow-up Information    Justice Britain, MD.   Specialty:  Orthopedic Surgery Why:  call to be seen in 10-14 days Contact  information: [address] [place] [address] [phone]           DISCHARGE TO: Home   DISCHARGE CONDITION:  Festus Barren for Dr. Justice Britain 05/22/2017, 8:49 AM

## 2017-06-02 ENCOUNTER — Ambulatory Visit (INDEPENDENT_AMBULATORY_CARE_PROVIDER_SITE_OTHER): Payer: Medicare Other | Admitting: Endocrinology

## 2017-06-02 VITALS — BP 120/76 | HR 81 | Temp 97.6°F | Wt 198.2 lb

## 2017-06-02 DIAGNOSIS — R0600 Dyspnea, unspecified: Secondary | ICD-10-CM | POA: Diagnosis not present

## 2017-06-02 DIAGNOSIS — Z23 Encounter for immunization: Secondary | ICD-10-CM | POA: Diagnosis not present

## 2017-06-02 MED ORDER — FLUTICASONE-SALMETEROL 100-50 MCG/DOSE IN AEPB: 1.0000 | INHALATION_SPRAY | Freq: Two times a day (BID) | RESPIRATORY_TRACT | 3 refills | Status: DC

## 2017-06-02 NOTE — Patient Instructions (Signed)
I have sent a prescription to your pharmacy, for an inhaler. No treatment is need for the gallstones, unless you have pain in the abdomen.

## 2017-06-02 NOTE — Progress Notes (Signed)
Subjective:    Patient ID: Bryan Tyler, male    DOB: 1946-06-15, 71 y.o.   MRN: [id number]  HPI Pt says he was noted to have cholelithiasis a few mos ago.  Pt states 1 year of slight doe sensation in the chest.  No assoc pain Past Medical History:  Diagnosis Date  . Adenocarcinoma of prostate ([place]) 04/16/2013  . Aneurysm, aorta, thoracic ([place])   . Anxiety   . Arthritis    "knees" (05/21/2017)  . Depression   . Diverticulosis   . Dyspnea    on exertion for a long period time  . GERD (gastroesophageal reflux disease)   . Hyperlipidemia   . Hypertension   . Hypothyroidism, postradioiodine therapy    1980's  . Nocturia   . Organic impotence   . OSA (obstructive sleep apnea)    NO CPAP--  S/P SURGERY  2002  . Osteoporosis   . Pneumonia 1970  . PONV (postoperative nausea and vomiting)   . Urge urinary incontinence   . Wears contact lenses     Past Surgical History:  Procedure Laterality Date  . HEMORRHOIDECTOMY WITH HEMORRHOID BANDING  2007  . INGUINAL HERNIA REPAIR Bilateral    x4  (2 each side)  . KNEE ARTHROSCOPY Right 2012  . NASAL SEPTUM SURGERY  1982   w/ rhinoplasty  . PROSTATE BIOPSY    . RADIOACTIVE SEED IMPLANT N/A 05/30/2013   Procedure: RADIOACTIVE SEED IMPLANT;  Surgeon: Hanley Ben, MD;  Location: [facility];  Service: Urology;  Laterality: N/A;  . REVERSE SHOULDER ARTHROPLASTY Right 05/21/2017  . REVERSE SHOULDER ARTHROPLASTY Right 05/21/2017  . REVERSE SHOULDER ARTHROPLASTY Right 05/21/2017   Procedure: REVERSE RIGHT SHOULDER ARTHROPLASTY;  Surgeon: Justice Britain, MD;  Location: [facility];  Service: Orthopedics;  Laterality: Right;  . RHINOPLASTY  1982   w/w/ septoplasty  . SHOULDER ARTHROSCOPY W/ ROTATOR CUFF REPAIR Right 2004  . SHOULDER ARTHROSCOPY W/ ROTATOR CUFF REPAIR Left 2016  . TONSILLECTOMY  AS CHILD  . UVULOPALATOPHARYNGOPLASTY  2002    Social History   Social History  . Marital status: Married    Spouse name: N/A   . Number of children: N/A  . Years of education: N/A   Occupational History  . Middle School Teacher    Social History Main Topics  . Smoking status: Former Smoker    Packs/day: 1.00    Years: 20.00    Types: Cigarettes    Quit date: 08/11/1988  . Smokeless tobacco: Never Used  . Alcohol use Yes     Comment: 05/21/2017 "1-2 beers/month"  . Drug use: No  . Sexual activity: Not Currently   Other Topics Concern  . Not on file   Social History Narrative  . No narrative on file    Current Outpatient Prescriptions on File Prior to Visit  Medication Sig Dispense Refill  . amLODipine (NORVASC) 2.5 MG tablet TAKE 1 TABLET(2.5 MG) BY MOUTH DAILY 90 tablet 0  . ARIPiprazole (ABILIFY) 10 MG tablet Take 10 mg by mouth daily.     Marland Kitchen aspirin 81 MG tablet Take 81 mg by mouth at bedtime.     Marland Kitchen atorvastatin (LIPITOR) 40 MG tablet TAKE 1 TABLET BY MOUTH EVERY EVENING (Patient taking differently: TAKE 40 MG BY MOUTH EVERY EVENING) 90 tablet 0  . clonazePAM (KLONOPIN) 1 MG tablet Take 1 tablet by mouth at bedtime.   2  . diazepam (VALIUM) 5 MG tablet Take 0.5-1 tablets (2.5-5 mg total) by  mouth every 6 (six) hours as needed for muscle spasms or sedation. 40 tablet 1  . diclofenac sodium (VOLTAREN) 1 % GEL Apply 4 g topically 4 (four) times daily. 100 g 2  . gabapentin (NEURONTIN) 400 MG capsule Take 800 mg by mouth at bedtime.   2  . levothyroxine (SYNTHROID, LEVOTHROID) 137 MCG tablet Take 1 tablet (137 mcg total) by mouth daily before breakfast. 90 tablet 3  . losartan (COZAAR) 100 MG tablet TAKE 1 TABLET(100 MG) BY MOUTH DAILY 90 tablet 0  . mesalamine (CANASA) 1000 MG suppository Place 1 suppository (1,000 mg total) rectally at bedtime. 30 suppository 1  . Multiple Vitamin (MULTIVITAMIN WITH MINERALS) TABS tablet Take 1 tablet by mouth daily.    . naproxen (NAPROSYN) 500 MG tablet Take 1 tablet (500 mg total) by mouth 2 (two) times daily with a meal. 60 tablet 1  . omeprazole (PRILOSEC) 40 MG  capsule TAKE 1 CAPSULE (40 MG TOTAL) BY MOUTH EVERY MORNING. (Patient taking differently: TAKE 1 CAPSULE (40 MG TOTAL) BY MOUTH DAILY AS NEEDED FOR HEARTBURN OR ACID REFLUX.) 90 capsule 3  . ondansetron (ZOFRAN) 4 MG tablet Take 1 tablet (4 mg total) by mouth every 8 (eight) hours as needed for nausea or vomiting. 10 tablet 0  . oxyCODONE (ROXICODONE) 5 MG immediate release tablet Take 1 tablet (5 mg total) by mouth every 4 (four) hours as needed for severe pain. 40 tablet 0  . oxymetazoline (ANEFRIN NASAL SPRAY) 0.05 % nasal spray Place 1 spray into both nostrils as needed for congestion.     . senna-docusate (SENOKOT-S) 8.6-50 MG tablet Take 1 tablet by mouth 2 (two) times daily. (Patient taking differently: Take 2 tablets by mouth 2 (two) times daily as needed for moderate constipation. ) 21 tablet 0  . tamsulosin (FLOMAX) 0.4 MG CAPS capsule Take 1 capsule (0.4 mg total) by mouth daily. (Patient taking differently: Take 0.4 mg by mouth every evening. ) 30 capsule 3  . venlafaxine XR (EFFEXOR-XR) 150 MG 24 hr capsule Take 300 mg by mouth daily with breakfast.     No current facility-administered medications on file prior to visit.     Allergies  Allergen Reactions  . Codeine Nausea And Vomiting and Other (See Comments)    DIZZINESS    Family History  Problem Relation Age of Onset  . Cancer Mother        Pancreatic Cancer  . Heart disease Father 34       CABG    BP 120/76 (BP Location: Left Arm)   Pulse 81   Temp 97.6 F (36.4 C) (Oral)   Wt 198 lb 3.2 oz (89.9 kg)   SpO2 97%   BMI 28.44 kg/m   Review of Systems Denies n/v and abd pain    Objective:   Physical Exam VITAL SIGNS:  See vs page GENERAL: no distress LUNGS:  Clear to auscultation ABDOMEN: abdomen is soft, nontender.  no hepatosplenomegaly.  not distended.  no hernia  Spirometry is not working today    Assessment & Plan:  Sob: without spirometry, we'll try advair Cholelithiasis: new, asymptomatic  Patient  Instructions  I have sent a prescription to your pharmacy, for an inhaler. No treatment is need for the gallstones, unless you have pain in the abdomen.

## 2017-06-02 NOTE — Progress Notes (Signed)
Pre visit review using our clinic review tool, if applicable. No additional management support is needed unless otherwise documented below in the visit note. 

## 2017-06-04 ENCOUNTER — Other Ambulatory Visit: Payer: Self-pay | Admitting: Endocrinology

## 2017-06-08 DIAGNOSIS — Z471 Aftercare following joint replacement surgery: Secondary | ICD-10-CM | POA: Diagnosis not present

## 2017-06-08 DIAGNOSIS — M75101 Unspecified rotator cuff tear or rupture of right shoulder, not specified as traumatic: Secondary | ICD-10-CM | POA: Diagnosis not present

## 2017-06-08 DIAGNOSIS — Z96611 Presence of right artificial shoulder joint: Secondary | ICD-10-CM | POA: Diagnosis not present

## 2017-06-08 DIAGNOSIS — M12811 Other specific arthropathies, not elsewhere classified, right shoulder: Secondary | ICD-10-CM | POA: Diagnosis not present

## 2017-06-23 DIAGNOSIS — F339 Major depressive disorder, recurrent, unspecified: Secondary | ICD-10-CM | POA: Diagnosis not present

## 2017-07-06 DIAGNOSIS — Z471 Aftercare following joint replacement surgery: Secondary | ICD-10-CM | POA: Diagnosis not present

## 2017-07-06 DIAGNOSIS — Z96611 Presence of right artificial shoulder joint: Secondary | ICD-10-CM | POA: Diagnosis not present

## 2017-07-07 ENCOUNTER — Other Ambulatory Visit: Payer: Self-pay

## 2017-07-07 MED ORDER — ATORVASTATIN CALCIUM 40 MG PO TABS: 40.0000 mg | ORAL_TABLET | Freq: Every evening | ORAL | 2 refills | Status: DC

## 2017-07-07 MED ORDER — LOSARTAN POTASSIUM 100 MG PO TABS: ORAL_TABLET | ORAL | 2 refills | Status: DC

## 2017-07-07 MED ORDER — AMLODIPINE BESYLATE 2.5 MG PO TABS: ORAL_TABLET | ORAL | 2 refills | Status: DC

## 2017-07-07 MED ORDER — LEVOTHYROXINE SODIUM 137 MCG PO TABS: 137.0000 ug | ORAL_TABLET | Freq: Every day | ORAL | 3 refills | Status: DC

## 2017-07-10 DIAGNOSIS — M25611 Stiffness of right shoulder, not elsewhere classified: Secondary | ICD-10-CM | POA: Diagnosis not present

## 2017-07-14 DIAGNOSIS — M25611 Stiffness of right shoulder, not elsewhere classified: Secondary | ICD-10-CM | POA: Diagnosis not present

## 2017-07-17 DIAGNOSIS — M25611 Stiffness of right shoulder, not elsewhere classified: Secondary | ICD-10-CM | POA: Diagnosis not present

## 2017-07-23 DIAGNOSIS — M25611 Stiffness of right shoulder, not elsewhere classified: Secondary | ICD-10-CM | POA: Diagnosis not present

## 2017-07-25 DIAGNOSIS — M7021 Olecranon bursitis, right elbow: Secondary | ICD-10-CM | POA: Diagnosis not present

## 2017-07-28 DIAGNOSIS — M25611 Stiffness of right shoulder, not elsewhere classified: Secondary | ICD-10-CM | POA: Diagnosis not present

## 2017-07-31 DIAGNOSIS — M25611 Stiffness of right shoulder, not elsewhere classified: Secondary | ICD-10-CM | POA: Diagnosis not present

## 2017-08-10 DIAGNOSIS — M25611 Stiffness of right shoulder, not elsewhere classified: Secondary | ICD-10-CM | POA: Diagnosis not present

## 2017-08-13 DIAGNOSIS — M25611 Stiffness of right shoulder, not elsewhere classified: Secondary | ICD-10-CM | POA: Diagnosis not present

## 2017-08-19 DIAGNOSIS — M25611 Stiffness of right shoulder, not elsewhere classified: Secondary | ICD-10-CM | POA: Diagnosis not present

## 2017-08-21 DIAGNOSIS — M25611 Stiffness of right shoulder, not elsewhere classified: Secondary | ICD-10-CM | POA: Diagnosis not present

## 2017-08-26 ENCOUNTER — Telehealth: Payer: Self-pay | Admitting: Endocrinology

## 2017-08-26 DIAGNOSIS — R251 Tremor, unspecified: Secondary | ICD-10-CM | POA: Diagnosis not present

## 2017-08-26 DIAGNOSIS — Z96611 Presence of right artificial shoulder joint: Secondary | ICD-10-CM | POA: Diagnosis not present

## 2017-08-26 DIAGNOSIS — Z471 Aftercare following joint replacement surgery: Secondary | ICD-10-CM | POA: Diagnosis not present

## 2017-08-26 DIAGNOSIS — R0600 Dyspnea, unspecified: Secondary | ICD-10-CM

## 2017-08-26 NOTE — Telephone Encounter (Signed)
Done. you will receive a phone call, about a day and time for an appointment 

## 2017-08-26 NOTE — Telephone Encounter (Signed)
Pt stated he is wanting Dr to refer him over to a cardiologist .  Please advise

## 2017-08-27 NOTE — Telephone Encounter (Signed)
I called patient & left VM to et him know he was referred to cardiology.

## 2017-09-02 ENCOUNTER — Ambulatory Visit (INDEPENDENT_AMBULATORY_CARE_PROVIDER_SITE_OTHER): Payer: Medicare Other | Admitting: Neurology

## 2017-09-02 ENCOUNTER — Encounter: Payer: Self-pay | Admitting: Neurology

## 2017-09-02 VITALS — BP 139/88 | HR 94 | Ht 70.0 in | Wt 202.0 lb

## 2017-09-02 DIAGNOSIS — R251 Tremor, unspecified: Secondary | ICD-10-CM

## 2017-09-02 NOTE — Progress Notes (Signed)
Subjective:    Patient ID: Bryan Tyler is a 72 y.o. male.  HPI     Bryan Age, MD, PhD [facility] [address] [place], [place] [address]  Dear Bryan Tyler,   I saw your patient, Bryan Tyler, upon your kind request in my neurologic clinic today for initial consultation of his tremors. The patient is unaccompanied today. As you know, Bryan Tyler is a 72 year old right-handed gentleman with an underlying medical history of hypertension, hyperlipidemia, reflux disease, depression, anxiety, arthritis, prostate cancer, sleep apnea, secondary hypothyroidism, who reports bilateral upper extremity tremors for the past 5-6 months, not before, except when he was diagnosed and treated for Graves disease around 1985. I reviewed your office records from 07/25/2017 which you kindly included. He had injection for right elbow bursitis at the time. He is married and lives with his wife, they have 1 child, he works part-time for the [employer]. He has a family history of Parkinson's disease in his paternal aunt, who lived to be 48. Father lived to be 85 years old and had dementia, maybe parkinsonism in the end, mother lived to be 22 years old and died of cancer. He quit smoking in 1980, drinks alcohol rarely, drinks caffeine in the form of coffee, typically 2-3 cups per day on average, spread out over the course of the morning.  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   He has a younger sister, no tremor.  Of note, he has been higher dose of Effexor for quite some time, maybe 2 years. He has been on Abilify as well. Recently, maybe 6 months ago the Abilify was increased from 5 mg to 10 mg. She takes it in the morning. He tries to stay active. He likes to travel. Mood wise, he is feeling well treated at this time. He tries to exercise in the form of walking on a regular basis.  His Past  Medical History Is Significant For: Past Medical History:  Diagnosis Date  . Adenocarcinoma of prostate ([place]) 04/16/2013  . Aneurysm, aorta, thoracic ([place])   . Anxiety   . Arthritis    "knees" (05/21/2017)  . Depression   . Diverticulosis   . Dyspnea    on exertion for a long period time  . GERD (gastroesophageal reflux disease)   . Hyperlipidemia   . Hypertension   . Hypothyroidism, postradioiodine therapy    1980's  . Nocturia   . Organic impotence   . OSA (obstructive sleep apnea)    NO CPAP--  S/P SURGERY  2002  . Osteoporosis   . Pneumonia 1970  . PONV (postoperative nausea and vomiting)   . Urge urinary incontinence   . Wears contact lenses     His Past Surgical History Is Significant For: Past Surgical History:  Procedure Laterality Date  . HEMORRHOIDECTOMY WITH HEMORRHOID BANDING  2007  . INGUINAL HERNIA REPAIR Bilateral    x4  (2 each side)  . KNEE ARTHROSCOPY Right 2012  . NASAL SEPTUM SURGERY  1982   w/ rhinoplasty  . PROSTATE BIOPSY    . RADIOACTIVE SEED IMPLANT N/A 05/30/2013   Procedure: RADIOACTIVE SEED IMPLANT;  Surgeon: Hanley Ben, MD;  Location: [facility];  Service: Urology;  Laterality: N/A;  . REVERSE SHOULDER ARTHROPLASTY Right 05/21/2017  . REVERSE SHOULDER ARTHROPLASTY Right 05/21/2017  . REVERSE SHOULDER ARTHROPLASTY Right 05/21/2017   Procedure: REVERSE RIGHT SHOULDER ARTHROPLASTY;  Surgeon: Onnie Graham,  Lennette Bihari, MD;  Location: [facility];  Service: Orthopedics;  Laterality: Right;  . RHINOPLASTY  1982   w/w/ septoplasty  . SHOULDER ARTHROSCOPY W/ ROTATOR CUFF REPAIR Right 2004  . SHOULDER ARTHROSCOPY W/ ROTATOR CUFF REPAIR Left 2016  . TONSILLECTOMY  AS CHILD  . UVULOPALATOPHARYNGOPLASTY  2002    His Family History Is Significant For: Family History  Problem Relation Tyler of Onset  . Cancer Mother        Pancreatic Cancer  . Heart disease Father 25       CABG    His Social History Is Significant For: Social History    Socioeconomic History  . Marital status: Married    Spouse name: None  . Number of children: None  . Years of education: None  . Highest education level: None  Social Needs  . Financial resource strain: None  . Food insecurity - worry: None  . Food insecurity - inability: None  . Transportation needs - medical: None  . Transportation needs - non-medical: None  Occupational History  . Occupation: Patent examiner  Tobacco Use  . Smoking status: Former Smoker    Packs/day: 1.00    Years: 20.00    Pack years: 20.00    Types: Cigarettes    Last attempt to quit: 08/11/1988    Years since quitting: 29.0  . Smokeless tobacco: Never Used  Substance and Sexual Activity  . Alcohol use: Yes    Comment: 05/21/2017 "1-2 beers/month"  . Drug use: No  . Sexual activity: Not Currently  Other Topics Concern  . None  Social History Narrative  . None    His Allergies Are:  Allergies  Allergen Reactions  . Codeine Nausea And Vomiting and Other (See Comments)    DIZZINESS  :   His Current Medications Are:  Outpatient Encounter Medications as of 09/02/2017  Medication Sig  . amLODipine (NORVASC) 2.5 MG tablet TAKE 1 TABLET(2.5 MG) BY MOUTH DAILY  . ARIPiprazole (ABILIFY) 10 MG tablet Take 10 mg by mouth daily.   Marland Kitchen aspirin 81 MG tablet Take 81 mg by mouth at bedtime.   Marland Kitchen atorvastatin (LIPITOR) 40 MG tablet Take 1 tablet (40 mg total) by mouth every evening.  . clonazePAM (KLONOPIN) 1 MG tablet Take 1 tablet by mouth at bedtime.   . diclofenac sodium (VOLTAREN) 1 % GEL Apply 4 g topically 4 (four) times daily.  Marland Kitchen gabapentin (NEURONTIN) 400 MG capsule Take 800 mg by mouth at bedtime.   Marland Kitchen levothyroxine (SYNTHROID, LEVOTHROID) 137 MCG tablet Take 1 tablet (137 mcg total) by mouth daily before breakfast.  . losartan (COZAAR) 100 MG tablet TAKE 1 TABLET(100 MG) BY MOUTH DAILY  . Multiple Vitamin (MULTIVITAMIN WITH MINERALS) TABS tablet Take 1 tablet by mouth daily.  Marland Kitchen omeprazole  (PRILOSEC) 40 MG capsule TAKE 1 CAPSULE (40 MG TOTAL) BY MOUTH EVERY MORNING. (Patient taking differently: TAKE 1 CAPSULE (40 MG TOTAL) BY MOUTH DAILY AS NEEDED FOR HEARTBURN OR ACID REFLUX.)  . oxymetazoline (ANEFRIN NASAL SPRAY) 0.05 % nasal spray Place 1 spray into both nostrils as needed for congestion.   . tamsulosin (FLOMAX) 0.4 MG CAPS capsule Take 1 capsule (0.4 mg total) by mouth daily. (Patient taking differently: Take 0.4 mg by mouth every evening. )  . venlafaxine XR (EFFEXOR-XR) 150 MG 24 hr capsule Take 300 mg by mouth daily with breakfast.  . [DISCONTINUED] diazepam (VALIUM) 5 MG tablet Take 0.5-1 tablets (2.5-5 mg total) by mouth every 6 (six) hours as  needed for muscle spasms or sedation.  . [DISCONTINUED] Fluticasone-Salmeterol (ADVAIR) 100-50 MCG/DOSE AEPB Inhale 1 puff into the lungs 2 (two) times daily.  . [DISCONTINUED] mesalamine (CANASA) 1000 MG suppository Place 1 suppository (1,000 mg total) rectally at bedtime.  . [DISCONTINUED] naproxen (NAPROSYN) 500 MG tablet Take 1 tablet (500 mg total) by mouth 2 (two) times daily with a meal.  . [DISCONTINUED] ondansetron (ZOFRAN) 4 MG tablet Take 1 tablet (4 mg total) by mouth every 8 (eight) hours as needed for nausea or vomiting.  . [DISCONTINUED] oxyCODONE (ROXICODONE) 5 MG immediate release tablet Take 1 tablet (5 mg total) by mouth every 4 (four) hours as needed for severe pain.  . [DISCONTINUED] senna-docusate (SENOKOT-S) 8.6-50 MG tablet Take 1 tablet by mouth 2 (two) times daily. (Patient taking differently: Take 2 tablets by mouth 2 (two) times daily as needed for moderate constipation. )   No facility-administered encounter medications on file as of 09/02/2017.   : Review of Systems:  Out of a complete 14 point review of systems, all are reviewed and negative with the exception of these symptoms as listed below:  Review of Systems  Neurological:       Pt presents today to discuss his hand tremors. The tremors seem to be  worse in the mornings. Pt is right handed.    Objective:  Neurological Exam  Physical Exam Physical Examination:   Vitals:   09/02/17 0936  BP: 139/88  Pulse: 94    General Examination: The patient is a very pleasant 73 y.o. male in no acute distress. He appears well-developed and well-nourished and well groomed.   HEENT: Normocephalic, atraumatic, pupils are equal, round and reactive to light and accommodation. He may have very mild facial masking and slight decrease in eye blink rate. He has no lip, neck or jaw tremor, no voice tremor. No significant hypophonia. Hearing is grossly intact. Airway exam is benign, tongue protrudes centrally and palate elevates symmetrically. Mild mouth dryness noted.  Chest: Clear to auscultation without wheezing, rhonchi or crackles noted.  Heart: S1+S2+0, regular and normal without murmurs, rubs or gallops noted.   Abdomen: Soft, non-tender and non-distended with normal bowel sounds appreciated on auscultation.  Extremities: There is no pitting edema in the distal lower extremities bilaterally. Pedal pulses are intact.  Skin: Warm and dry without trophic changes noted.  Musculoskeletal: exam reveals no obvious joint deformities, tenderness or joint swelling or erythema.   Neurologically:  Mental status: The patient is awake, alert and oriented in all 4 spheres. His immediate and remote memory, attention, language skills and fund of knowledge are appropriate. There is no evidence of aphasia, agnosia, apraxia or anomia. Speech is clear with normal prosody and enunciation. Thought process is linear. Mood is normal and affect is normal.  Cranial nerves II - XII are as described above under HEENT exam. In addition: shoulder shrug is normal with equal shoulder height noted. Motor exam: Normal bulk, strength and tone is noted. There is no drift, or rebound.   On 09/02/2017: On Archimedes spiral drawing he has minimal trembling with the left and right  hand, handwriting is minimally tremulous, very legible, slightly on the smaller side. He does not have any telltale resting tremor.  He has a bilateral upper extremity postural tremor, fine and fairly fast. Also, mild action tremor, no significant intention tremor.   Romberg is negative, except for mild swaying. Reflexes are 1+ throughout. Fine motor skills and coordination: intact with normal finger taps, normal hand  movements, normal rapid alternating patting, normal foot taps and normal foot agility.  Cerebellar testing: No dysmetria or intention tremor on finger to nose testing. Heel to shin is unremarkable bilaterally. There is no truncal or gait ataxia.  Sensory exam: intact to light touch in the upper and lower extremities.  Gait, station and balance: He stands easily. No veering to one side is noted. No leaning to one side is noted. Posture is Tyler-appropriate and stance is narrow based. Gait shows normal stride length and normal pace, slight decrease in arm swing on the right, could be secondary to recent shoulder surgery.               Assessment and Plan:  In summary, KALEN RATAJCZAK is a very pleasant 72 y.o.-year old male with an underlying medical history of hypertension, hyperlipidemia, reflux disease, depression, anxiety, arthritis, prostate cancer, sleep apnea, hypothyroidism, who presents for neurologic consultation of his bilateral hand tremors of approximately 6 months duration. His history and exam is not suggestive of essential tremor. He does have a family history of parkinsonism or Parkinson's disease, he does not have any telltale signs or symptoms of Parkinson's disease, nevertheless, he may have very subtle features of parkinsonism, maybe drug-induced tremor versus drug-induced parkinsonism. His medications include high-dose Effexor and also Abilify which was coincidently increased about 6 months ago to 10 mg daily. It can be really difficult for patients to change from 1  antidepressant to another. I don't believe it is worth his while to try to reduce or come off of the Effexor as this medication can also cause significant repercussions when it is changed, including withdrawal symptoms. On the other hand, he may be able to try a reduced dose of Abilify, such as 5 mg daily. He would be willing to consider this. He is encouraged to talk to his psychiatry provider about this possibility. His neurological exam is otherwise nonfocal, his history otherwise is benign from the neurological standpoint and therefore we mutually agreed to forego any additional testing such as sudden MRI brain at this time. I would like to monitor his symptoms and exam. I suggested a six-month follow-up at this time, sooner if needed. I answered all his questions today and he was in agreement.  Thank you very much for allowing me to participate in the care of this nice patient. If I can be of any further assistance to you please do not hesitate to call me at [phone].  Sincerely,   Bryan Age, MD, PhD

## 2017-09-02 NOTE — Patient Instructions (Addendum)
I do think your mild hand tremor may be due to medication effect from your Abilify and your Effexor.  It can be quite difficult for patients to change from 1 antidepressant to another and it may not be worthwhile trying to switch the Effexor as it has been working well for you. On the other hand, you may be able to try a smaller dose of Abilify and see an improvement in your tremors. Please discuss with [place] [place] next time.   Your history and exam does not suggest what we call Essential tremor.   You know telltale signs of parkinsonism. Thankfully, your exam otherwise is nonfocal.  We will continue to monitor your symptoms and exam. I will see you back routinely in 6 months, sooner as needed.

## 2017-09-10 ENCOUNTER — Ambulatory Visit (INDEPENDENT_AMBULATORY_CARE_PROVIDER_SITE_OTHER): Payer: Medicare Other | Admitting: Interventional Cardiology

## 2017-09-10 ENCOUNTER — Encounter: Payer: Self-pay | Admitting: Interventional Cardiology

## 2017-09-10 VITALS — BP 118/82 | HR 95 | Ht 70.0 in | Wt 201.8 lb

## 2017-09-10 DIAGNOSIS — I712 Thoracic aortic aneurysm, without rupture, unspecified: Secondary | ICD-10-CM

## 2017-09-10 DIAGNOSIS — Z8249 Family history of ischemic heart disease and other diseases of the circulatory system: Secondary | ICD-10-CM

## 2017-09-10 DIAGNOSIS — R9431 Abnormal electrocardiogram [ECG] [EKG]: Secondary | ICD-10-CM | POA: Diagnosis not present

## 2017-09-10 DIAGNOSIS — R0782 Intercostal pain: Secondary | ICD-10-CM

## 2017-09-10 NOTE — Progress Notes (Signed)
Cardiology Office Note   Date:  09/10/2017   ID:  Bryan Tyler, Bryan Tyler November 28, 1945, MRN [id number]  PCP:  Renato Shin, MD    No chief complaint on file.  Thoracic aortic aneurysm  Wt Readings from Last 3 Encounters:  09/10/17 201 lb 12.8 oz (91.5 kg)  09/02/17 202 lb (91.6 kg)  06/02/17 198 lb 3.2 oz (89.9 kg)       History of Present Illness: Bryan Tyler is a 72 y.o. male  Who had a thoracic aneurysm diagnosed a few years ago.  This is followed by Dr. Servando Snare with annual imaging studies.   Father had CABG x3 at age 82.  Brother passed away in car accident.  No one with stents in his generation.  He had a stress test several years ago, in [place].    Denies : Exertional Chest pain. Dizziness. Leg edema. Nitroglycerin use. Orthopnea. Palpitations. Paroxysmal nocturnal dyspnea. Shortness of breath. Syncope.   He has not exercised over the past 3 months due to shoulder replacement.   He has gained weight recently as well, since shoulder surgery.   Past Medical History:  Diagnosis Date  . Adenocarcinoma of prostate ([place]) 04/16/2013  . Aneurysm, aorta, thoracic ([place])   . Anxiety   . Arthritis    "knees" (05/21/2017)  . Depression   . Diverticulosis   . Dyspnea    on exertion for a long period time  . GERD (gastroesophageal reflux disease)   . Hyperlipidemia   . Hypertension   . Hypothyroidism, postradioiodine therapy    1980's  . Nocturia   . Organic impotence   . OSA (obstructive sleep apnea)    NO CPAP--  S/P SURGERY  2002  . Osteoporosis   . Pneumonia 1970  . PONV (postoperative nausea and vomiting)   . Urge urinary incontinence   . Wears contact lenses     Past Surgical History:  Procedure Laterality Date  . HEMORRHOIDECTOMY WITH HEMORRHOID BANDING  2007  . INGUINAL HERNIA REPAIR Bilateral    x4  (2 each side)  . KNEE ARTHROSCOPY Right 2012  . NASAL SEPTUM SURGERY  1982   w/ rhinoplasty  . PROSTATE BIOPSY    . RADIOACTIVE SEED IMPLANT  N/A 05/30/2013   Procedure: RADIOACTIVE SEED IMPLANT;  Surgeon: Hanley Ben, MD;  Location: [facility];  Service: Urology;  Laterality: N/A;  . REVERSE SHOULDER ARTHROPLASTY Right 05/21/2017  . REVERSE SHOULDER ARTHROPLASTY Right 05/21/2017  . REVERSE SHOULDER ARTHROPLASTY Right 05/21/2017   Procedure: REVERSE RIGHT SHOULDER ARTHROPLASTY;  Surgeon: Justice Britain, MD;  Location: [facility];  Service: Orthopedics;  Laterality: Right;  . RHINOPLASTY  1982   w/w/ septoplasty  . SHOULDER ARTHROSCOPY W/ ROTATOR CUFF REPAIR Right 2004  . SHOULDER ARTHROSCOPY W/ ROTATOR CUFF REPAIR Left 2016  . TONSILLECTOMY  AS CHILD  . UVULOPALATOPHARYNGOPLASTY  2002     Current Outpatient Medications  Medication Sig Dispense Refill  . ARIPiprazole (ABILIFY) 10 MG tablet Take 10 mg by mouth daily.     Marland Kitchen aspirin 81 MG tablet Take 81 mg by mouth at bedtime.     Marland Kitchen atorvastatin (LIPITOR) 40 MG tablet Take 1 tablet (40 mg total) by mouth every evening. 90 tablet 2  . clonazePAM (KLONOPIN) 1 MG tablet Take 1 tablet by mouth at bedtime.   2  . gabapentin (NEURONTIN) 400 MG capsule Take 800 mg by mouth at bedtime.   2  . levothyroxine (SYNTHROID, LEVOTHROID) 137 MCG tablet Take  1 tablet (137 mcg total) by mouth daily before breakfast. 90 tablet 3  . losartan (COZAAR) 100 MG tablet TAKE 1 TABLET(100 MG) BY MOUTH DAILY 90 tablet 2  . Multiple Vitamin (MULTIVITAMIN WITH MINERALS) TABS tablet Take 1 tablet by mouth daily.    Marland Kitchen omeprazole (PRILOSEC) 40 MG capsule Take 40 mg by mouth daily as needed (heartburn).    Marland Kitchen oxymetazoline (ANEFRIN NASAL SPRAY) 0.05 % nasal spray Place 1 spray into both nostrils as needed for congestion.     . tamsulosin (FLOMAX) 0.4 MG CAPS capsule Take 1 capsule (0.4 mg total) by mouth daily. (Patient taking differently: Take 0.4 mg by mouth every evening. ) 30 capsule 3  . venlafaxine XR (EFFEXOR-XR) 150 MG 24 hr capsule Take 300 mg by mouth daily with breakfast.     No current  facility-administered medications for this visit.     Allergies:   Codeine    Social History:  The patient  reports that he quit smoking about 29 years ago. His smoking use included cigarettes. He has a 20.00 pack-year smoking history. he has never used smokeless tobacco. He reports that he drinks alcohol. He reports that he does not use drugs.   Family History:  The patient's family history includes Cancer in his mother; Heart disease (age of onset: 32) in his father.    ROS:  Please see the history of present illness.   Otherwise, review of systems are positive for twinges in the chest.   All other systems are reviewed and negative.    PHYSICAL EXAM: VS:  BP 118/82 (BP Location: Left Arm, Patient Position: Sitting, Cuff Size: Normal)   Pulse 95   Ht 5\' 10"  (1.778 m)   Wt 201 lb 12.8 oz (91.5 kg)   SpO2 96%   BMI 28.96 kg/m  , BMI Body mass index is 28.96 kg/m. GEN: Well nourished, well developed, in no acute distress  HEENT: normal  Neck: no JVD, carotid bruits, or masses Cardiac: RRR; no murmurs, rubs, or gallops,no edema  Respiratory:  clear to auscultation bilaterally, normal work of breathing GI: soft, nontender, nondistended, + BS MS: no deformity or atrophy  Skin: warm and dry, no rash Neuro:  Strength and sensation are intact Psych: euthymic mood, full affect   EKG:   The ekg ordered in 9/18 demonstrates NSR, inferior Q waves, no ST changes   Recent Labs: 04/14/2017: TSH 0.41 05/19/2017: BUN 14; Creatinine, Ser 0.90; Hemoglobin 14.5; Platelets 253; Potassium 4.4; Sodium 137   Lipid Panel    Component Value Date/Time   CHOL 160 04/14/2017 1446   TRIG 268.0 (H) 04/14/2017 1446   HDL 43.00 04/14/2017 1446   CHOLHDL 4 04/14/2017 1446   VLDL 53.6 (H) 04/14/2017 1446   LDLCALC 100 (H) 02/06/2014 1514   LDLDIRECT 79.0 04/14/2017 1446     Other studies Reviewed: Additional studies/ records that were reviewed today with results demonstrating: 2017 echo  reviewed.   ASSESSMENT AND PLAN:  1. Thoracic aneurysm: 4.2 cm at last check.  Followed by cardiac surgery.  Avoid heavy straining.  We discussed adding beta blocker but he feels he takes too many pills.  We will reconsider at a later time.  2. Family h/o CAD: Plan for ETT.  Healthy lifestyle including healthy diet and increased exercise.  3. Atypical chest pain: twinges that are not related to exertion.  Does not sound like ischemic pain.   4. Abnormal ECG: inferior Q waves present, but no RWMA on echo  in 2017.  Q waves were present on 2013 ECG as well.  I don't think he has had a silent MI.     Current medicines are reviewed at length with the patient today.  The patient concerns regarding his medicines were addressed.  The following changes have been made:  No change  Labs/ tests ordered today include: ETT No orders of the defined types were placed in this encounter.   Recommend 150 minutes/week of aerobic exercise Low fat, low carb, high fiber diet recommended  Disposition:   FU in 1 year if ETT normal   Signed, Larae Grooms, MD  09/10/2017 3:16 PM    [facility] [place], [place], [place]  [address] Phone: [phone]; Fax: [phone]

## 2017-09-10 NOTE — Patient Instructions (Signed)
Medication Instructions:  Your physician recommends that you continue on your current medications as directed. Please refer to the Current Medication list given to you today.   Labwork: None ordered  Testing/Procedures: Your physician has requested that you have an exercise tolerance test. For further information please visit [email or web address]. Please also follow instruction sheet, as given.  Follow-Up: Your physician wants you to follow-up in: 1 year with Dr. Varanasi. You will receive a reminder letter in the mail two months in advance. If you don't receive a letter, please call our office to schedule the follow-up appointment.   Any Other Special Instructions Will Be Listed Below (If Applicable).   Exercise Stress Electrocardiogram An exercise stress electrocardiogram is a test to check how blood flows to your heart. It is done to find areas of poor blood flow. You will need to walk on a treadmill for this test. The electrocardiogram will record your heartbeat when you are at rest and when you are exercising. What happens before the procedure?  Do not have drinks with caffeine or foods with caffeine for 24 hours before the test, or as told by your doctor. This includes coffee, tea (even decaf tea), sodas, chocolate, and cocoa.  Follow your doctor's instructions about eating and drinking before the test.  Ask your doctor what medicines you should or should not take before the test. Take your medicines with water unless told by your doctor not to.  If you use an inhaler, bring it with you to the test.  Bring a snack to eat after the test.  Do not  smoke for 4 hours before the test.  Do not put lotions, powders, creams, or oils on your chest before the test.  Wear comfortable shoes and clothing. What happens during the procedure?  You will have patches put on your chest. Small areas of your chest may need to be shaved. Wires will be connected to the patches.  Your heart rate  will be watched while you are resting and while you are exercising.  You will walk on the treadmill. The treadmill will slowly get faster to raise your heart rate.  The test will take about 1-2 hours. What happens after the procedure?  Your heart rate and blood pressure will be watched after the test.  You may return to your normal diet, activities, and medicines or as told by your doctor. This information is not intended to replace advice given to you by your health care provider. Make sure you discuss any questions you have with your health care provider. Document Released: 01/14/2008 Document Revised: 03/26/2016 Document Reviewed: 04/04/2013 Elsevier Interactive Patient Education  2018 Elsevier Inc.    If you need a refill on your cardiac medications before your next appointment, please call your pharmacy.   

## 2017-09-15 DIAGNOSIS — F339 Major depressive disorder, recurrent, unspecified: Secondary | ICD-10-CM | POA: Diagnosis not present

## 2017-09-16 ENCOUNTER — Ambulatory Visit (INDEPENDENT_AMBULATORY_CARE_PROVIDER_SITE_OTHER): Payer: Medicare Other

## 2017-09-16 DIAGNOSIS — Z8249 Family history of ischemic heart disease and other diseases of the circulatory system: Secondary | ICD-10-CM | POA: Diagnosis not present

## 2017-09-16 LAB — EXERCISE TOLERANCE TEST
CHL RATE OF PERCEIVED EXERTION: 15
CSEPED: 3 min
CSEPEDS: 6 s
CSEPHR: 88 %
Estimated workload: 4.7 METS
MPHR: 148 {beats}/min
Peak HR: 131 {beats}/min
Rest HR: 92 {beats}/min

## 2017-10-20 ENCOUNTER — Telehealth: Payer: Self-pay

## 2017-10-20 NOTE — Telephone Encounter (Signed)
Patient called and states he received letter from [facility] in regards to his Losartan 100 mg medicaiton that stated that this medication has some various chemicals in that were detected that could cause cancer and wanted to see what is the next step if his medication was affected by this. CB is [phone], please review, thank Edrick Kins, RMA

## 2017-10-20 NOTE — Telephone Encounter (Signed)
I called patient & [facility] has already sent him a new prescription for mediation with new manufacturer.

## 2017-11-19 DIAGNOSIS — H2513 Age-related nuclear cataract, bilateral: Secondary | ICD-10-CM | POA: Diagnosis not present

## 2018-01-29 ENCOUNTER — Other Ambulatory Visit: Payer: Self-pay | Admitting: Cardiothoracic Surgery

## 2018-01-29 DIAGNOSIS — I712 Thoracic aortic aneurysm, without rupture, unspecified: Secondary | ICD-10-CM

## 2018-02-02 DIAGNOSIS — F339 Major depressive disorder, recurrent, unspecified: Secondary | ICD-10-CM | POA: Diagnosis not present

## 2018-02-17 DIAGNOSIS — L570 Actinic keratosis: Secondary | ICD-10-CM | POA: Diagnosis not present

## 2018-02-17 DIAGNOSIS — L820 Inflamed seborrheic keratosis: Secondary | ICD-10-CM | POA: Diagnosis not present

## 2018-02-17 DIAGNOSIS — X32XXXD Exposure to sunlight, subsequent encounter: Secondary | ICD-10-CM | POA: Diagnosis not present

## 2018-02-22 ENCOUNTER — Ambulatory Visit
Admission: RE | Admit: 2018-02-22 | Discharge: 2018-02-22 | Disposition: A | Discharge status: Home / Self Care | Payer: Medicare Other | Source: Ambulatory Visit | Attending: Cardiothoracic Surgery | Admitting: Cardiothoracic Surgery

## 2018-02-22 ENCOUNTER — Other Ambulatory Visit: Payer: Medicare Other

## 2018-02-22 ENCOUNTER — Ambulatory Visit (INDEPENDENT_AMBULATORY_CARE_PROVIDER_SITE_OTHER): Payer: Medicare Other | Admitting: Cardiothoracic Surgery

## 2018-02-22 VITALS — BP 109/73 | HR 80 | Resp 20 | Ht 70.0 in | Wt 192.0 lb

## 2018-02-22 DIAGNOSIS — I712 Thoracic aortic aneurysm, without rupture, unspecified: Secondary | ICD-10-CM

## 2018-02-22 MED ORDER — IOPAMIDOL (ISOVUE-370) INJECTION 76%
75.0000 mL | Freq: Once | INTRAVENOUS | Status: AC | PRN
  Administered 2018-02-22: 75 mL via INTRAVENOUS

## 2018-02-22 NOTE — Patient Instructions (Signed)

## 2018-02-22 NOTE — Progress Notes (Signed)
[address] GardensSuite [address]       [place] [address]             [phone]                    Fabio E Renegar [facility] Medical Record #[id number] Date of Birth: 01-15-1946  Referring: Lacretia Leigh, MD Primary Care: Renato Shin, MD Primary Cardiology:No primary care provider on file. Chief Complaint:    Chief Complaint  Patient presents with  . Thoracic Aortic Aneurysm    1 year f/u with Chest CTA    History of Present Illness: Patient returns to the office today for reevaluation of an incidental finding of a 4.2 cm ascending aortic dilatation.  The patient was working around his house and fell off a 5 foot wall and fractured ribs on the left lower chest.  Prior to his last visit an echocardiogram showed a trileaflet aortic valve.   Since last seen the patient has had his right shoulder replaced.  He also notes that he has developed increasing cough somewhat productive over the past several months after a trip to [place].  He denies fever or chills   On close questioning the patient has no family history of aortic dissection aortic aneurysms or sudden death at a premature age from unexplained causes.   .  Current Activity/ Functional Status:  Patient is independent with mobility/ambulation, transfers, ADL's, IADL's.   Zubrod Score: At the time of surgery this patient's most appropriate activity status/level should be described as: [x]     0    Normal activity, no symptoms []     1    Restricted in physical strenuous activity but ambulatory, able to do out light work []     2    Ambulatory and capable of self care, unable to do work activities, up and about               >50 % of waking hours                              []     3    Only limited self care, in bed greater than 50% of waking hours []     4    Completely disabled, no self care, confined to bed or chair []     5    Moribund   Past Medical History:  Diagnosis Date  . Adenocarcinoma of prostate ([facility])  04/16/2013  . Aneurysm, aorta, thoracic ([facility])   . Anxiety   . Arthritis    "knees" (05/21/2017)  . Depression   . Diverticulosis   . Dyspnea    on exertion for a long period time  . GERD (gastroesophageal reflux disease)   . Hyperlipidemia   . Hypertension   . Hypothyroidism, postradioiodine therapy    1980's  . Nocturia   . Organic impotence   . OSA (obstructive sleep apnea)    NO CPAP--  S/P SURGERY  2002  . Osteoporosis   . Pneumonia 1970  . PONV (postoperative nausea and vomiting)   . Urge urinary incontinence   . Wears contact lenses     Past Surgical History:  Procedure Laterality Date  . HEMORRHOIDECTOMY WITH HEMORRHOID BANDING  2007  . INGUINAL HERNIA REPAIR Bilateral    x4  (2 each side)  . KNEE ARTHROSCOPY Right 2012  . NASAL SEPTUM SURGERY  1982   w/ rhinoplasty  .  PROSTATE BIOPSY    . RADIOACTIVE SEED IMPLANT N/A 05/30/2013   Procedure: RADIOACTIVE SEED IMPLANT;  Surgeon: Hanley Ben, MD;  Location: [facility];  Service: Urology;  Laterality: N/A;  . REVERSE SHOULDER ARTHROPLASTY Right 05/21/2017  . REVERSE SHOULDER ARTHROPLASTY Right 05/21/2017  . REVERSE SHOULDER ARTHROPLASTY Right 05/21/2017   Procedure: REVERSE RIGHT SHOULDER ARTHROPLASTY;  Surgeon: Justice Britain, MD;  Location: [facility];  Service: Orthopedics;  Laterality: Right;  . RHINOPLASTY  1982   w/w/ septoplasty  . SHOULDER ARTHROSCOPY W/ ROTATOR CUFF REPAIR Right 2004  . SHOULDER ARTHROSCOPY W/ ROTATOR CUFF REPAIR Left 2016  . TONSILLECTOMY  AS CHILD  . UVULOPALATOPHARYNGOPLASTY  2002    Family History  Problem Relation Age of Onset  . Cancer Mother        Pancreatic Cancer  . Heart disease Father 86       CABG    Social History   Socioeconomic History  . Marital status: Married    Spouse name: Not on file  . Number of children: Not on file  . Years of education: Not on file  . Highest education level: Not on file  Occupational History  . Occupation: Sales promotion account executive  Social Needs  . Financial resource strain: Not on file  . Food insecurity:    Worry: Not on file    Inability: Not on file  . Transportation needs:    Medical: Not on file    Non-medical: Not on file  Tobacco Use  . Smoking status: Former Smoker    Packs/day: 1.00    Years: 20.00    Pack years: 20.00    Types: Cigarettes    Last attempt to quit: 08/11/1988    Years since quitting: 29.5  . Smokeless tobacco: Never Used  Substance and Sexual Activity  . Alcohol use: Yes    Comment: 05/21/2017 "1-2 beers/month"  . Drug use: No  . Sexual activity: Not Currently  Lifestyle  . Physical activity:    Days per week: Not on file    Minutes per session: Not on file  . Stress: Not on file  Relationships  . Social connections:    Talks on phone: Not on file    Gets together: Not on file    Attends religious service: Not on file    Active member of club or organization: Not on file    Attends meetings of clubs or organizations: Not on file    Relationship status: Not on file  . Intimate partner violence:    Fear of current or ex partner: Not on file    Emotionally abused: Not on file    Physically abused: Not on file    Forced sexual activity: Not on file  Other Topics Concern  . Not on file  Social History Narrative  . Not on file    Social History   Tobacco Use  Smoking Status Former Smoker  . Packs/day: 1.00  . Years: 20.00  . Pack years: 20.00  . Types: Cigarettes  . Last attempt to quit: 08/11/1988  . Years since quitting: 29.5  Smokeless Tobacco Never Used    Social History   Substance and Sexual Activity  Alcohol Use Yes   Comment: 05/21/2017 "1-2 beers/month"     Allergies  Allergen Reactions  . Codeine Nausea And Vomiting and Other (See Comments)    DIZZINESS    Current Outpatient Medications  Medication Sig Dispense Refill  . ARIPiprazole (ABILIFY) 10 MG tablet  Take 5 mg by mouth daily.     Marland Kitchen aspirin 81 MG tablet Take 81 mg by mouth  at bedtime.     Marland Kitchen atorvastatin (LIPITOR) 40 MG tablet Take 1 tablet (40 mg total) by mouth every evening. 90 tablet 2  . clonazePAM (KLONOPIN) 1 MG tablet Take 1 tablet by mouth at bedtime.   2  . gabapentin (NEURONTIN) 400 MG capsule Take 800 mg by mouth at bedtime.   2  . levothyroxine (SYNTHROID, LEVOTHROID) 137 MCG tablet Take 1 tablet (137 mcg total) by mouth daily before breakfast. 90 tablet 3  . losartan (COZAAR) 100 MG tablet TAKE 1 TABLET(100 MG) BY MOUTH DAILY 90 tablet 2  . Multiple Vitamin (MULTIVITAMIN WITH MINERALS) TABS tablet Take 1 tablet by mouth daily.    Marland Kitchen omeprazole (PRILOSEC) 40 MG capsule Take 40 mg by mouth daily as needed (heartburn).    Marland Kitchen oxymetazoline (ANEFRIN NASAL SPRAY) 0.05 % nasal spray Place 1 spray into both nostrils as needed for congestion.     . tamsulosin (FLOMAX) 0.4 MG CAPS capsule Take 1 capsule (0.4 mg total) by mouth daily. (Patient taking differently: Take 0.4 mg by mouth every evening. ) 30 capsule 3  . venlafaxine XR (EFFEXOR-XR) 150 MG 24 hr capsule Take 300 mg by mouth daily with breakfast.     No current facility-administered medications for this visit.       Review of Systems:  Review of Systems  Constitutional: Positive for weight loss. Negative for chills, diaphoresis, fever and malaise/fatigue.  HENT: Negative.   Eyes: Negative.   Respiratory: Positive for cough and sputum production. Negative for hemoptysis, shortness of breath and wheezing.   Cardiovascular: Negative.   Gastrointestinal: Negative.   Genitourinary: Negative.   Musculoskeletal: Negative.   Skin: Negative.   Neurological: Negative.   Endo/Heme/Allergies: Negative.   Psychiatric/Behavioral: Negative.     Immunizations: Flu up to date Blue.Reese  ]; Pneumococcal up to date [ y ];   Physical Exam: BP 109/73   Pulse 80   Resp 20   Ht 5\' 10"  (1.778 m)   Wt 192 lb (87.1 kg)   SpO2 97% Comment: RA  BMI 27.55 kg/m   PHYSICAL EXAMINATION: General appearance: alert,  cooperative and appears stated age Head: Normocephalic, without obvious abnormality, atraumatic Neck: no adenopathy, no carotid bruit, no JVD, supple, symmetrical, trachea midline and thyroid not enlarged, symmetric, no tenderness/mass/nodules Lymph nodes: Cervical, supraclavicular, and axillary nodes normal. Resp: clear to auscultation bilaterally Cardio: regular rate and rhythm, S1, S2 normal, no murmur, click, rub or gallop GI: soft, non-tender; bowel sounds normal; no masses,  no organomegaly Extremities: extremities normal, atraumatic, no cyanosis or edema and Homans sign is negative, no sign of DVT Neurologic: Grossly normal  PATIENT HAS NO STIGMATA OF MARFAN'S HAS PALPABLE PT AND DP PULSES BILATERALLY   Diagnostic Studies & Laboratory data:     Recent Radiology Findings:  Ct Angio Chest Aorta W/cm &/or Wo/cm  Result Date: 02/22/2018 CLINICAL DATA:  Followup thoracic aortic aneurysm. EXAM: CT ANGIOGRAPHY CHEST WITH CONTRAST TECHNIQUE: Multidetector CT imaging of the chest was performed using the standard protocol during bolus administration of intravenous contrast. Multiplanar CT image reconstructions and MIPs were obtained to evaluate the vascular anatomy. CONTRAST:  25mL ISOVUE-370 IOPAMIDOL (ISOVUE-370) INJECTION 76% COMPARISON:  01/29/2017 FINDINGS: Cardiovascular: Normal. No pericardial effusion. Ascending thoracic aortic aneurysm measures 4.1 cm, image 75/4 and is unchanged from previous exam. The transverse aortic arch measures 2.7 cm, image 109/11. The posterior  arch measures 2.3 cm, image 115/11. At the level of the hiatus the thoracic aorta measures 2.7 cm. Calcification noted within the LAD coronary artery. Mediastinum/Nodes: The trachea appears patent and is midline. Normal appearance of the esophagus. No enlarged mediastinal or hilar lymph nodes. Lungs/Pleura: No pleural effusions. Bilateral areas of interstitial reticulation and patchy ground-glass attenuation with traction  bronchiectasis noted. This appears progressive when compared with 12/22/2015 and is suggestive of chronic interstitial lung disease. There is a 4 mm nodule within the right lower lobe and is unchanged from 12/22/2015. Upper Abdomen: No acute findings identified within the upper abdomen. Gallstones noted. Musculoskeletal: Scoliosis and spondylosis noted. No aggressive lytic or sclerotic bone lesions. Review of the MIP images confirms the above findings. IMPRESSION: 1. Stable mild ascending thoracic aortic aneurysm. Recommend annual imaging followup by CTA or MRA. This recommendation follows 2010 ACCF/AHA/AATS/ACR/ASA/SCA/SCAI/SIR/STS/SVM Guidelines for the Diagnosis and Management of Patients with Thoracic Aortic Disease. Circulation. 2010; 121: K932-I712 2. Persistent and progressive chronic interstitial changes within both lungs which may reflect early usual interstitial pneumonia (UIP) or nonspecific interstitial pneumonia. 3. Lad coronary artery atherosclerotic calcifications 4. Gallstones. Electronically Signed   By: Kerby Moors M.D.   On: 02/22/2018 15:17   I have independently reviewed the above radiology studies  and reviewed the findings with the patient.  Ct Angio Chest Aorta W/cm &/or Wo/cm  Result Date: 01/29/2017 CLINICAL DATA:  Thoracic aortic aneurysm without rupture. EXAM: CT ANGIOGRAPHY CHEST WITH CONTRAST TECHNIQUE: Multidetector CT imaging of the chest was performed using the standard protocol during bolus administration of intravenous contrast. Multiplanar CT image reconstructions and MIPs were obtained to evaluate the vascular anatomy. CONTRAST:  75 mL of Isovue 370 intravenously. COMPARISON:  CT scan of Dec 22, 2015. FINDINGS: Cardiovascular: 4.2 cm ascending thoracic aortic aneurysm is noted. No dissection is noted. Transverse aortic arch measures 2.7 cm. Proximal descending thoracic aorta measures 2.5 cm. No pericardial effusion is noted. Mediastinum/Nodes: No enlarged mediastinal,  hilar, or axillary lymph nodes. Thyroid gland, trachea, and esophagus demonstrate no significant findings. Lungs/Pleura: No pneumothorax or pleural effusion is noted. Mild bronchiectasis is noted in both lower lobes with stable bibasilar peripheral scarring. No acute abnormality is noted. Upper Abdomen: Cholelithiasis is noted without inflammation. Stable left renal cyst. Musculoskeletal: No chest wall abnormality. No acute or significant osseous findings. Review of the MIP images confirms the above findings. IMPRESSION: Grossly stable 4.2 cm ascending thoracic aortic aneurysm. Recommend annual imaging followup by CTA or MRA. This recommendation follows 2010 ACCF/AHA/AATS/ACR/ASA/SCA/SCAI/SIR/STS/SVM Guidelines for the Diagnosis and Management of Patients with Thoracic Aortic Disease. Circulation. 2010; 121: W580-D983. Cholelithiasis without inflammation. Aortic aneurysm NOS (ICD10-I71.9). Electronically Signed   By: Marijo Conception, M.D.   On: 01/29/2017 14:07    I have independently reviewed the above radiology studies  and reviewed the findings with the patient.    Ct Chest W Contrast  12/22/2015  CLINICAL DATA:  72 year old male with fall EXAM: CT CHEST, ABDOMEN, AND PELVIS WITH CONTRAST TECHNIQUE: Multidetector CT imaging of the chest, abdomen and pelvis was performed following the standard protocol during bolus administration of intravenous contrast. CONTRAST:  167mL ISOVUE-300 IOPAMIDOL (ISOVUE-300) INJECTION 61% COMPARISON:  Abdominal MRI dated 03/02/2011 FINDINGS: CT CHEST There is mild diffuse interstitial coarsening with areas of atelectasis/ scarring at the lung bases. There is no focal consolidation, pleural effusion, or pneumothorax. An accessory azygos fissure noted. The central airways are patent. There is mild dilatation of the ascending aorta measuring 4.3 cm in diameter. The thoracic  aorta is otherwise unremarkable. No dissection or traumatic injury. The origins of the great vessels of the  aortic arch appear patent. The central pulmonary arteries appear patent. There is no cardiomegaly or pericardial effusion. There is coronary vascular calcification. There is no hilar or mediastinal adenopathy. The esophagus is grossly unremarkable. The thyroid nodule is not visualized. There is no axillary adenopathy. The chest wall soft tissues appear unremarkable. There is osteopenia. There is minimally displaced fracture of the left posterior ninth rib. Nondisplaced fractures of the left posterior tenth and eleventh ribs noted. Right shoulder rotator cuff surgical pinning noted. CT ABDOMEN AND PELVIS No intra-abdominal free air or free fluid. There stone within the gallbladder. No pericholecystic fluid. Ultrasound may provide better evaluation of the gallbladder if clinically indicated. The liver, pancreas, spleen, and the adrenal glands appear unremarkable. Multiple left renal hypodense lesions measuring up to 4.2 cm in the upper pole of the left kidney. The larger lesions measure cyst. The smaller lesions are too small to characterize but however seen on the prior MRI and described as cysts. The right kidney is unremarkable. There is no hydronephrosis on either side. The visualized ureters and urinary bladder appear unremarkable. Prostate brachytherapy seeds noted. There is sigmoid diverticulosis without acute inflammatory changes. Constipation. There is no evidence of bowel obstruction or active inflammation. Normal appendix. The abdominal aorta and IVC appear unremarkable. No portal venous gas identified. There is no adenopathy. The abdominal wall soft tissues appear unremarkable. There is degenerative changes of the spine. Old healed left pubic bone fracture deformity. No acute fracture. IMPRESSION: Fractures of the left posterior 9-11th ribs. No pneumothorax. No other acute/traumatic intrathoracic, abdominal, or pelvic pathology identified. A 4.3 cm dilatation of the ascending aorta. Recommend annual  imaging followup by CTA or MRA. This recommendation follows 2010 ACCF/AHA/AATS/ACR/ASA/SCA/SCAI/SIR/STS/SVM Guidelines for the Diagnosis and Management of Patients with Thoracic Aortic Disease. Circulation. 2010; 121: D220-U542 Electronically Signed   By: Anner Crete M.D.   On: 12/22/2015 22:39     Ct Cervical Spine Wo Contrast  12/22/2015  CLINICAL DATA:  Fall today.  Neck Pain EXAM: CT CERVICAL SPINE WITHOUT CONTRAST TECHNIQUE: Multidetector CT imaging of the cervical spine was performed without intravenous contrast. Multiplanar CT image reconstructions were also generated. COMPARISON:  None. FINDINGS: Disc degeneration and spondylosis C4-5 and C5-6. 2 mm anterior slip C7-T1 due to advanced facet degeneration on the left. Right-sided facet degeneration C2-3 and C3-4. Negative for cervical spine fracture. No mass or adenopathy in the neck. Lung apices clear. IMPRESSION: Cervical spine degenerative change.  No acute fracture. Electronically Signed   By: Franchot Gallo M.D.   On: 12/22/2015 22:13   Ct Abdomen Pelvis W Contrast  12/22/2015  CLINICAL DATA:  72 year old male with fall EXAM: CT CHEST, ABDOMEN, AND PELVIS WITH CONTRAST TECHNIQUE: Multidetector CT imaging of the chest, abdomen and pelvis was performed following the standard protocol during bolus administration of intravenous contrast. CONTRAST:  123mL ISOVUE-300 IOPAMIDOL (ISOVUE-300) INJECTION 61% COMPARISON:  Abdominal MRI dated 03/02/2011 FINDINGS: CT CHEST There is mild diffuse interstitial coarsening with areas of atelectasis/ scarring at the lung bases. There is no focal consolidation, pleural effusion, or pneumothorax. An accessory azygos fissure noted. The central airways are patent. There is mild dilatation of the ascending aorta measuring 4.3 cm in diameter. The thoracic aorta is otherwise unremarkable. No dissection or traumatic injury. The origins of the great vessels of the aortic arch appear patent. The central pulmonary arteries  appear patent. There is no cardiomegaly or  pericardial effusion. There is coronary vascular calcification. There is no hilar or mediastinal adenopathy. The esophagus is grossly unremarkable. The thyroid nodule is not visualized. There is no axillary adenopathy. The chest wall soft tissues appear unremarkable. There is osteopenia. There is minimally displaced fracture of the left posterior ninth rib. Nondisplaced fractures of the left posterior tenth and eleventh ribs noted. Right shoulder rotator cuff surgical pinning noted. CT ABDOMEN AND PELVIS No intra-abdominal free air or free fluid. There stone within the gallbladder. No pericholecystic fluid. Ultrasound may provide better evaluation of the gallbladder if clinically indicated. The liver, pancreas, spleen, and the adrenal glands appear unremarkable. Multiple left renal hypodense lesions measuring up to 4.2 cm in the upper pole of the left kidney. The larger lesions measure cyst. The smaller lesions are too small to characterize but however seen on the prior MRI and described as cysts. The right kidney is unremarkable. There is no hydronephrosis on either side. The visualized ureters and urinary bladder appear unremarkable. Prostate brachytherapy seeds noted. There is sigmoid diverticulosis without acute inflammatory changes. Constipation. There is no evidence of bowel obstruction or active inflammation. Normal appendix. The abdominal aorta and IVC appear unremarkable. No portal venous gas identified. There is no adenopathy. The abdominal wall soft tissues appear unremarkable. There is degenerative changes of the spine. Old healed left pubic bone fracture deformity. No acute fracture. IMPRESSION: Fractures of the left posterior 9-11th ribs. No pneumothorax. No other acute/traumatic intrathoracic, abdominal, or pelvic pathology identified. A 4.3 cm dilatation of the ascending aorta. Recommend annual imaging followup by CTA or MRA. This recommendation follows 2010  ACCF/AHA/AATS/ACR/ASA/SCA/SCAI/SIR/STS/SVM Guidelines for the Diagnosis and Management of Patients with Thoracic Aortic Disease. Circulation. 2010; 121: O277-A128 Electronically Signed   By: Anner Crete M.D.   On: 12/22/2015 22:39    echo: *[facility]*                         [address], [place] [address]                            [phone]  ------------------------------------------------------------------- Echocardiography  Patient:    Garlin, Batdorf MR #:       [id number] Study Date: 01/30/2016 Gender:     M Age:        51 Height:     177.8 cm Weight:     83.9 kg BSA:        2.05 m^2 Pt. Status: Room:   ATTENDING    Lanelle Bal MD  Linneus MD  Advance MD  SONOGRAPHER  [facility], Outpatient  cc:  ------------------------------------------------------------------- LV EF: 60% -   65%  ------------------------------------------------------------------- History:   PMH:  Former Smoker, GERD. Preoperative Clearance.  Risk factors:  Hypertension. Dyslipidemia.  ------------------------------------------------------------------- Study Conclusions  - Left ventricle: The cavity size was normal. There was moderate   concentric hypertrophy. Systolic function was normal. The   estimated ejection fraction was in the range of 60% to 65%. Wall   motion was normal; there were no regional wall motion  abnormalities. Doppler parameters are consistent with abnormal   left ventricular relaxation (grade 1 diastolic dysfunction).   Doppler parameters are consistent with indeterminate ventricular   filling pressure. - Aortic valve: Transvalvular velocity was within the normal range.   There was no stenosis. There was mild regurgitation.   Regurgitation pressure half-time: 586 ms. - Mitral valve: Transvalvular  velocity was within the normal range.   There was no evidence for stenosis. There was no regurgitation. - Right ventricle: The cavity size was normal. Wall thickness was   normal. Systolic function was normal. - Tricuspid valve: There was mild regurgitation. - Pulmonary arteries: Systolic pressure was within the normal   range. PA peak pressure: 29 mm Hg (S).  Echocardiography.  M-mode, complete 2D, spectral Doppler, and color Doppler.  Birthdate:  Patient birthdate: [birth year].  Age:  Patient is 72 yr old.  Sex:  Gender: male.    BMI: 26.5 kg/m^2.  Patient status:  Outpatient.  Study date:  Study date: 01/30/2016. Study time: 08:45 AM.  Location:  Echo laboratory.  -------------------------------------------------------------------  ------------------------------------------------------------------- Left ventricle:  The cavity size was normal. There was moderate concentric hypertrophy. Systolic function was normal. The estimated ejection fraction was in the range of 60% to 65%. Wall motion was normal; there were no regional wall motion abnormalities. Doppler parameters are consistent with abnormal left ventricular relaxation (grade 1 diastolic dysfunction). Doppler parameters are consistent with indeterminate ventricular filling pressure.  ------------------------------------------------------------------- Aortic valve:   Trileaflet; normal thickness leaflets. Mobility was not restricted.  Doppler:  Transvalvular velocity was within the normal range. There was no stenosis. There was mild regurgitation.   ------------------------------------------------------------------- Aorta:  Aortic root: The aortic root was normal in size. Ascending aorta: The ascending aorta was mildly dilated.  ------------------------------------------------------------------- Mitral valve:   Structurally normal valve.   Mobility was not restricted.  Doppler:  Transvalvular velocity was within the  normal range. There was no evidence for stenosis. There was no regurgitation.  ------------------------------------------------------------------- Left atrium:  The atrium was normal in size.  ------------------------------------------------------------------- Right ventricle:  The cavity size was normal. Wall thickness was normal. Systolic function was normal.  ------------------------------------------------------------------- Pulmonic valve:    Structurally normal valve.   Cusp separation was normal.  Doppler:  Transvalvular velocity was within the normal range. There was no evidence for stenosis. There was trivial regurgitation.  ------------------------------------------------------------------- Tricuspid valve:   Structurally normal valve.    Doppler: Transvalvular velocity was within the normal range. There was mild regurgitation.  ------------------------------------------------------------------- Pulmonary artery:   The main pulmonary artery was normal-sized. Systolic pressure was within the normal range.  ------------------------------------------------------------------- Right atrium:  The atrium was normal in size.  ------------------------------------------------------------------- Pericardium:  There was no pericardial effusion.  ------------------------------------------------------------------- Systemic veins: Inferior vena cava: The vessel was normal in size. The respirophasic diameter changes were in the normal range (>= 50%), consistent with normal central venous pressure.  ------------------------------------------------------------------- Measurements   Left ventricle                           Value        Reference  LV ID, ED, PLAX chordal          (L)     34.4  mm     43 - 52  LV ID, ES, PLAX chordal          (L)     22.3  mm     23 - 38  LV fx shortening,  PLAX chordal           35    %      >=29  LV PW thickness, ED                       15.4  mm     ---------  IVS/LV PW ratio, ED                      1.03         <=1.3  LV e&', lateral                           7.94  cm/s   ---------  LV E/e&', lateral                         5.5          ---------  LV e&', medial                            4.35  cm/s   ---------  LV E/e&', medial                          10.05        ---------  LV e&', average                           6.15  cm/s   ---------  LV E/e&', average                         7.11         ---------    Ventricular septum                       Value        Reference  IVS thickness, ED                        15.9  mm     ---------    LVOT                                     Value        Reference  LVOT ID, S                               20    mm     ---------  LVOT area                                3.14  cm^2   ---------    Aortic valve                             Value        Reference  Aortic regurg pressure half-time         586   ms     ---------    Aorta  Value        Reference  Aortic root ID, ED                       40    mm     ---------  Ascending aorta ID, A-P                  40.7  mm     ---------    Left atrium                              Value        Reference  LA ID, A-P, ES                           28    mm     ---------  LA ID/bsa, A-P                           1.37  cm/m^2 <=2.2  LA volume, ES, 1-p A4C                   37.7  ml     ---------  LA volume/bsa, ES, 1-p A4C               18.4  ml/m^2 ---------  LA volume, ES, 1-p A2C                   38.8  ml     ---------  LA volume/bsa, ES, 1-p A2C               18.9  ml/m^2 ---------    Mitral valve                             Value        Reference  Mitral E-wave peak velocity              43.7  cm/s   ---------  Mitral A-wave peak velocity              72.8  cm/s   ---------  Mitral deceleration time         (H)     356   ms     150 - 230  Mitral E/A ratio, peak                   0.6           ---------    Pulmonary arteries                       Value        Reference  PA pressure, S, DP                       29    mm Hg  <=30    Tricuspid valve                          Value        Reference  Tricuspid regurg peak velocity           254   cm/s   ---------  Tricuspid peak RV-RA gradient  26    mm Hg  ---------    Systemic veins                           Value        Reference  Estimated CVP                            3     mm Hg  ---------    Right ventricle                          Value        Reference  TAPSE                                    19.1  mm     ---------  RV pressure, S, DP                       29    mm Hg  <=30    Pulmonic valve                           Value        Reference  Pulmonic regurg velocity, ED             124   cm/s   ---------  Pulmonic regurg gradient, ED             6     mm Hg  ---------  Legend: (L)  and  (H)  mark values outside specified reference range.  ------------------------------------------------------------------- Prepared and Electronically Authenticated by  Skeet Latch, MD 2017-06-21T09:59:22       Recent Lab Findings: Lab Results  Component Value Date   WBC 7.9 05/19/2017   HGB 14.5 05/19/2017   HCT 43.4 05/19/2017   PLT 253 05/19/2017   GLUCOSE 98 05/19/2017   CHOL 160 04/14/2017   TRIG 268.0 (H) 04/14/2017   HDL 43.00 04/14/2017   LDLDIRECT 79.0 04/14/2017   LDLCALC 100 (H) 02/06/2014   ALT 20 04/11/2016   AST 21 04/11/2016   NA 137 05/19/2017   K 4.4 05/19/2017   CL 102 05/19/2017   CREATININE 0.90 05/19/2017   BUN 14 05/19/2017   CO2 25 05/19/2017   TSH 0.41 04/14/2017   INR 0.93 05/24/2013   Aortic Size Index=    4.2     /Body surface area is 2.07 meters squared. = 2.1  < 2.75 cm/m2      4% risk per year 2.75 to 4.25          8% risk per year > 4.25 cm/m2    20% risk per year     Assessment / Plan:  #1 stable 4.2 cm dilatation of the ascending aorta, with a trileaflet  aortic valve by echo without sclerosis or stenosis #2. Persistent and progressive chronic interstitial changes within both lungs which may reflect early usual interstitial pneumonia (UIP) or nonspecific interstitial pneumonia.  #3 lad coronary artery atherosclerotic calcifications by CT scan-without signs or symptoms of angina #4 gallstones-by CT scan asymptomatic   follow-up CTA of the chest one year-to evaluate the size of his aorta Patient will be referred to Dr. Chase Caller by our pulmonology-to evaluate for possible interstitial  lung disease with the persistence noted on CT scan and also the patient's increasing symptoms of cough.  Patient was warned about not using Cipro and similar antibiotics. Recent studies have raised concern that fluoroquinolone antibiotics could be associated with an increased risk of aortic aneurysm Fluoroquinolones have non-antimicrobial properties that might jeopardise the integrity of the extracellular matrix of the vascular wall In a  propensity score matched cohort study in [place], there was a 66% increased rate of aortic aneurysm or dissection associated with oral fluoroquinolone use, compared with amoxicillin use, within a 60 day risk period from start of treatment     Grace Isaac MD      [address] [place] [address] Office [phone]   Beeper [phone]  02/22/2018 3:40 PM

## 2018-03-02 ENCOUNTER — Encounter: Payer: Self-pay | Admitting: Neurology

## 2018-03-02 ENCOUNTER — Ambulatory Visit (INDEPENDENT_AMBULATORY_CARE_PROVIDER_SITE_OTHER): Payer: Medicare Other | Admitting: Neurology

## 2018-03-02 VITALS — BP 110/83 | HR 95 | Ht 70.0 in | Wt 191.0 lb

## 2018-03-02 DIAGNOSIS — R251 Tremor, unspecified: Secondary | ICD-10-CM | POA: Diagnosis not present

## 2018-03-02 DIAGNOSIS — G2119 Other drug induced secondary parkinsonism: Secondary | ICD-10-CM | POA: Diagnosis not present

## 2018-03-02 NOTE — Progress Notes (Signed)
Subjective:    Patient ID: Bryan Tyler is a 72 y.o. male.  HPI     Interim history:   Bryan Tyler is a 72 year old right-handed gentleman with an underlying medical history of hypertension, hyperlipidemia, reflux disease, depression, anxiety, arthritis, prostate cancer, sleep apnea, secondary hypothyroidism, who presents for follow-up consultation of his upper extremity tremors. The patient is unaccompanied today. Her first met him on 09/02/2017 at the request of his primary care provider, at which time he reported an approximately six-month history of hand tremors. He was on some psychotropic medications at the time including Effexor and Abilify. His Abilify had been increased some 6 months prior. I suggested clinical observation and perhaps consideration of reducing or eliminating his Abilify. He was advised to talk to his psychiatry provider about this.  Today, 03/02/2018: He reports doing fairly well, he feels stable. Tremor is not particularly bothersome, occurs primarily first thing in the morning, can affect both upper extremities and lower extremities and seems to subside after a minute or 2, he has been able to reduce his Abilify to 5 mg, no obvious repercussions thankfully and no obvious change in the tremor condition either. He is still on Effexor long-acting 300 mg in the morning. He is working on weight loss and has lost about 10 pounds, getting ready for his daughter's wedding in [place] in December. He is looking forward to it. He and his wife go to the gym on a regular basis. He has not had any recent falls, does have the occasional balance issue when he first stands up. He is wondering if any of his nighttime medications could be contributing to the tremor in the morning but I explained to him the clonazepam and gabapentin showed if anything help with the tremor. However, these medications certainly don't necessarily help with balance issues.  The patient's allergies,  current medications, family history, past medical history, past social history, past surgical history and problem list were reviewed and updated as appropriate.   Previously:   09/02/2017: (He) reports bilateral upper extremity tremors for the past 5-6 months, not before, except when he was diagnosed and treated for Graves disease around 1985. I reviewed your office records from 07/25/2017 which you kindly included. He had injection for right elbow bursitis at the time. He is married and lives with his wife, they have 1 child, he works part-time for the [employer]. He has a family history of Parkinson's disease in his paternal aunt, who lived to be 29. Father lived to be 55 years old and had dementia, maybe parkinsonism in the end, mother lived to be 19 years old and died of cancer. He quit smoking in 1980, drinks alcohol rarely, drinks caffeine in the form of coffee, typically 2-3 cups per day on average, spread out over the course of the morning.  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   He has a younger sister, no tremor.  Of note, he has been higher dose of Effexor for quite some time, maybe 2 years. He has been on Abilify as well. Recently, maybe 6 months ago the Abilify was increased from 5 mg to 10 mg. He takes it in the morning. He tries to stay active. He likes to travel. Mood wise, he is feeling well treated at this time. He tries to exercise in the form of walking on a regular basis.   His Past Medical History Is Significant For: Past Medical History:  Diagnosis Date  . Adenocarcinoma of prostate ([place]) 04/16/2013  . Aneurysm, aorta, thoracic ([place])   . Anxiety   . Arthritis    "knees" (05/21/2017)  . Depression   . Diverticulosis   . Dyspnea    on exertion for a long period time  . GERD (gastroesophageal reflux disease)   . Hyperlipidemia   . Hypertension   . Hypothyroidism, postradioiodine therapy    1980's   . Nocturia   . Organic impotence   . OSA (obstructive sleep apnea)    NO CPAP--  S/P SURGERY  2002  . Osteoporosis   . Pneumonia 1970  . PONV (postoperative nausea and vomiting)   . Urge urinary incontinence   . Wears contact lenses     His Past Surgical History Is Significant For: Past Surgical History:  Procedure Laterality Date  . HEMORRHOIDECTOMY WITH HEMORRHOID BANDING  2007  . INGUINAL HERNIA REPAIR Bilateral    x4  (2 each side)  . KNEE ARTHROSCOPY Right 2012  . NASAL SEPTUM SURGERY  1982   w/ rhinoplasty  . PROSTATE BIOPSY    . RADIOACTIVE SEED IMPLANT N/A 05/30/2013   Procedure: RADIOACTIVE SEED IMPLANT;  Surgeon: Hanley Ben, MD;  Location: [facility];  Service: Urology;  Laterality: N/A;  . REVERSE SHOULDER ARTHROPLASTY Right 05/21/2017  . REVERSE SHOULDER ARTHROPLASTY Right 05/21/2017  . REVERSE SHOULDER ARTHROPLASTY Right 05/21/2017   Procedure: REVERSE RIGHT SHOULDER ARTHROPLASTY;  Surgeon: Justice Britain, MD;  Location: [facility];  Service: Orthopedics;  Laterality: Right;  . RHINOPLASTY  1982   w/w/ septoplasty  . SHOULDER ARTHROSCOPY W/ ROTATOR CUFF REPAIR Right 2004  . SHOULDER ARTHROSCOPY W/ ROTATOR CUFF REPAIR Left 2016  . TONSILLECTOMY  AS CHILD  . UVULOPALATOPHARYNGOPLASTY  2002    His Family History Is Significant For: Family History  Problem Relation Age of Onset  . Cancer Mother        Pancreatic Cancer  . Heart disease Father 63       CABG    His Social History Is Significant For: Social History   Socioeconomic History  . Marital status: Married    Spouse name: Not on file  . Number of children: Not on file  . Years of education: Not on file  . Highest education level: Not on file  Occupational History  . Occupation: Patent examiner  Social Needs  . Financial resource strain: Not on file  . Food insecurity:    Worry: Not on file    Inability: Not on file  . Transportation needs:    Medical: Not on file     Non-medical: Not on file  Tobacco Use  . Smoking status: Former Smoker    Packs/day: 1.00    Years: 20.00    Pack years: 20.00    Types: Cigarettes    Last attempt to quit: 08/11/1988    Years since quitting: 29.5  . Smokeless tobacco: Never Used  Substance and Sexual Activity  . Alcohol use: Yes    Comment: 05/21/2017 "1-2 beers/month"  . Drug use: No  . Sexual activity: Not Currently  Lifestyle  . Physical activity:    Days per week: Not on file    Minutes per session: Not on file  . Stress: Not on file  Relationships  . Social connections:    Talks on phone: Not on file    Gets together: Not on file    Attends religious service: Not on file    Active member  of club or organization: Not on file    Attends meetings of clubs or organizations: Not on file    Relationship status: Not on file  Other Topics Concern  . Not on file  Social History Narrative  . Not on file    His Allergies Are:  Allergies  Allergen Reactions  . Codeine Nausea And Vomiting and Other (See Comments)    DIZZINESS  . Ciprofloxacin   :  His Current Medications Are:  Outpatient Encounter Medications as of 03/02/2018  Medication Sig  . ARIPiprazole (ABILIFY) 10 MG tablet Take 5 mg by mouth daily.   Marland Kitchen aspirin 81 MG tablet Take 81 mg by mouth at bedtime.   Marland Kitchen atorvastatin (LIPITOR) 40 MG tablet Take 1 tablet (40 mg total) by mouth every evening.  . clonazePAM (KLONOPIN) 1 MG tablet Take 1 tablet by mouth at bedtime.   . gabapentin (NEURONTIN) 400 MG capsule Take 800 mg by mouth at bedtime.   Marland Kitchen levothyroxine (SYNTHROID, LEVOTHROID) 137 MCG tablet Take 1 tablet (137 mcg total) by mouth daily before breakfast.  . losartan (COZAAR) 100 MG tablet TAKE 1 TABLET(100 MG) BY MOUTH DAILY  . Multiple Vitamin (MULTIVITAMIN WITH MINERALS) TABS tablet Take 1 tablet by mouth daily.  Marland Kitchen omeprazole (PRILOSEC) 40 MG capsule Take 40 mg by mouth daily as needed (heartburn).  Marland Kitchen oxymetazoline (ANEFRIN NASAL SPRAY) 0.05 %  nasal spray Place 1 spray into both nostrils as needed for congestion.   . tamsulosin (FLOMAX) 0.4 MG CAPS capsule Take 1 capsule (0.4 mg total) by mouth daily. (Patient taking differently: Take 0.4 mg by mouth every evening. )  . venlafaxine XR (EFFEXOR-XR) 150 MG 24 hr capsule Take 300 mg by mouth daily with breakfast.   No facility-administered encounter medications on file as of 03/02/2018.   :  Review of Systems:  Out of a complete 14 point review of systems, all are reviewed and negative with the exception of these symptoms as listed below: Review of Systems  Neurological:       Patient returning to discuss tremor. Patient stated symptoms have been the same. Tremor present upon waking up in the morning.     Objective:  Neurological Exam  Physical Exam Physical Examination:   Vitals:   03/02/18 1031  BP: 110/83  Pulse: 95   General Examination: The patient is a very pleasant 72 y.o. male in no acute distress. He appears well-developed and well-nourished and well groomed. Good spirits.   HEENT: Normocephalic, atraumatic, pupils are equal, round and reactive to light and accommodation. He may have very mild facial masking and slight decrease in eye blink rate. He has no lip, neck or jaw tremor, nor voice tremor. No significant hypophonia. Hearing is grossly intact. Airway exam is benign, tongue protrudes centrally and palate elevates symmetrically. Mild mouth dryness noted. No oral facial dyskinesias.  Chest: Clear to auscultation without wheezing, rhonchi or crackles noted.  Heart: S1+S2+0, regular and normal without murmurs, rubs or gallops noted.   Abdomen: Soft, non-tender and non-distended.  Extremities: There is no pitting edema in the distal lower extremities bilaterally.   Skin: Warm and dry without trophic changes noted.  Musculoskeletal: exam reveals no obvious joint deformities, tenderness or joint swelling or erythema.   Neurologically:  Mental status:  The patient is awake, alert and oriented in all 4 spheres. His immediate and remote memory, attention, language skills and fund of knowledge are appropriate. There is no evidence of aphasia, agnosia, apraxia or anomia. Speech  is clear with normal prosody and enunciation. Thought process is linear. Mood is normal and affect is normal.  Cranial nerves II - XII are as described above under HEENT exam. In addition: shoulder shrug is normal with equal shoulder height noted. Motor exam: Normal bulk, strength and tone is noted. There is no drift, or rebound.   (On 09/02/2017: On Archimedes spiral drawing he has minimal trembling with the left and right hand, handwriting is minimally tremulous, very legible, slightly on the smaller side. He does not have any telltale resting tremor.)  He has a very minimal postural tremor, no action tremor, no resting tremor and no significant intention tremor, appears improved compared to last time   Romberg is negative, except for mild swaying. Reflexes are 1+ throughout. Fine motor skills and coordination: intact with normal finger taps, normal hand movements, normal rapid alternating patting, normal foot taps and normal foot agility, although, he may have a mild decrement in amplitude in all 4 extremities with foot taps and finger taps.  Cerebellar testing: No dysmetria or intention tremor. There is no truncal or gait ataxia.  Sensory exam: intact to light touch in the upper and lower extremities.  Gait, station and balance: He stands easily. No veering to one side is noted. No leaning to one side is noted. Posture is age-appropriate and stance is narrow based. Gait shows normal stride length and normal pace, slight decrease in arm swing on the right, could be secondary to recent shoulder surgery. Slight insecurity with turns.               Assessment and Plan:  In summary, Bryan Tyler is a very pleasant 72 year old male with an underlying medical history of  hypertension, hyperlipidemia, reflux disease, depression, anxiety, arthritis, prostate cancer, sleep apnea, hypothyroidism, who presents for FU consultation of his bilateral hand tremors of approximately several months duration, maybe one year. His history and exam are not telltale for essential tremor. While he does have a family history of parkinsonism/Parkinson's disease, his history and exam are also not telltale for idiopathic Parkinson's disease but he may have very subtle features of parkinsonism, possibly drug-induced parkinsonism. Drug-induced tremor is also a possibility what with his higher dose of Effexor. Because of these complicating factors I would like to proceed with a DaT scan, which can be a helpful tool in diagnostic dilemmas with tremor disorders. He is advised that this is not a diagnostic test. His neurological exam is stable, tremor slightly better on my examination but clinically and by his perception he is about the same. He is advised to continue with his current medication regimen. I did not suggest any new medications. We talked about the importance of good hydration, good nutrition and healthy lifestyle and exercise. I would like to routinely see him back in follow-up in 6 months, sooner if needed. We will call him with his scan results in the interim. I answered all his questions today and he was in agreement. I spent 25 minutes in total face-to-face time with the patient, more than 50% of which was spent in counseling and coordination of care, reviewing test results, reviewing medication and discussing or reviewing the diagnosis of tremor and parkinsonism, the prognosis and treatment options. Pertinent laboratory and imaging test results that were available during this visit with the patient were reviewed by me and considered in my medical decision making (see chart for details).

## 2018-03-02 NOTE — Patient Instructions (Signed)
As discussed, I would like to proceed with a DaT scan: This is a specialized brain scan designed to help with diagnosis of tremor disorders. A radioactive marker gets injected and the uptake is measured in the brain and compared to normal controls and right side is compared to the left, a change in uptake can help with diagnosis of certain tremor disorders. A brain MRI on the other hand is a brain scan that helps look at the brain structure in more detail overall and look for age-related changes, blood vessel related changes and look for stroke and volume loss which we call atrophy.   I think it would be a helpful tool to get this test done. We will call you with the results and I will routinely see you in follow-up for a recheck on the tremors in about 6 months.

## 2018-03-10 ENCOUNTER — Telehealth: Payer: Self-pay | Admitting: Neurology

## 2018-03-10 NOTE — Telephone Encounter (Signed)
Pt is asking for a call re: the status of being scheduled for his DAT scan, please call

## 2018-03-10 NOTE — Telephone Encounter (Signed)
Called and spoke to patient still waiting on insurance process DAT scan still pending . Patient understood details.

## 2018-03-22 ENCOUNTER — Other Ambulatory Visit: Payer: Self-pay | Admitting: Endocrinology

## 2018-03-25 ENCOUNTER — Encounter (HOSPITAL_COMMUNITY)
Admission: RE | Admit: 2018-03-25 | Discharge: 2018-03-25 | Disposition: A | Discharge status: Home / Self Care | Payer: Medicare Other | Source: Ambulatory Visit | Attending: Neurology | Admitting: Neurology

## 2018-03-25 DIAGNOSIS — R251 Tremor, unspecified: Secondary | ICD-10-CM

## 2018-03-25 MED ORDER — IODINE STRONG (LUGOLS) 5 % PO SOLN
0.8000 mL | Freq: Once | ORAL | Status: AC
  Administered 2018-03-25: 0.8 mL via ORAL
  Filled 2018-03-25: qty 0.8

## 2018-03-25 MED ORDER — IOFLUPANE I 123 185 MBQ/2.5ML IV SOLN
4.7000 | Freq: Once | INTRAVENOUS | Status: AC
  Administered 2018-03-25: 4.7 via INTRAVENOUS

## 2018-03-30 ENCOUNTER — Telehealth: Payer: Self-pay

## 2018-03-30 NOTE — Progress Notes (Signed)
DaT scan showed slightly abnormal findings, I believe, not enough to sway diagnosis in one direction or another. Overall, mildly asymmetric uptake of the marker, bottom line is, we should follow him clinically, FU as planned in 6 months. Please update pt. Bryan Age, MD, PhD [facility])

## 2018-03-30 NOTE — Telephone Encounter (Signed)
I called pt. I advised him of his DAT scan results. Pt is agreeable to continuing to follow up in 6 months as planned, and I reminded pt of his appt with [place] in January. Pt verbalized understanding of results. Pt had no questions at this time but was encouraged to call back if questions arise.

## 2018-03-30 NOTE — Telephone Encounter (Signed)
-----   Message from Star Age, MD sent at 03/30/2018  7:41 AM EDT ----- DaT scan showed slightly abnormal findings, I believe, not enough to sway diagnosis in one direction or another. Overall, mildly asymmetric uptake of the marker, bottom line is, we should follow him clinically, FU as planned in 6 months. Please update pt. Star Age, MD, PhD [facility])

## 2018-04-13 ENCOUNTER — Other Ambulatory Visit: Payer: Self-pay | Admitting: Endocrinology

## 2018-04-14 ENCOUNTER — Telehealth: Payer: Self-pay | Admitting: Endocrinology

## 2018-04-14 ENCOUNTER — Ambulatory Visit: Payer: Medicare Other | Admitting: Endocrinology

## 2018-04-14 ENCOUNTER — Other Ambulatory Visit: Payer: Self-pay

## 2018-04-14 MED ORDER — LOSARTAN POTASSIUM 100 MG PO TABS: ORAL_TABLET | ORAL | 2 refills | Status: DC

## 2018-04-14 NOTE — Telephone Encounter (Signed)
Please advise pt last OV 06/02/17

## 2018-04-14 NOTE — Telephone Encounter (Signed)
ok 

## 2018-04-14 NOTE — Telephone Encounter (Signed)
Patient needs RX for Losartan tablets 100 mg sent to [facility] in [place] (asap-patient out-needs enough medication to get through until his mail order pharmacy ([facility]-is faxing Dr. Loanne Drilling refill request) which will take approx. 18 days

## 2018-04-14 NOTE — Telephone Encounter (Signed)
done

## 2018-04-20 ENCOUNTER — Other Ambulatory Visit: Payer: Self-pay | Admitting: Endocrinology

## 2018-04-27 DIAGNOSIS — F339 Major depressive disorder, recurrent, unspecified: Secondary | ICD-10-CM | POA: Diagnosis not present

## 2018-04-27 NOTE — Telephone Encounter (Signed)
Pt is asking for a call from RN to discuss in greater detail the results of his DAT scan, please call

## 2018-04-27 NOTE — Telephone Encounter (Signed)
I called pt. He wanted his DAT scan results explained to him again. I explained to him his results again. Pt is agreeable to following up in January as planned. Pt verbalized understanding of results.

## 2018-04-27 NOTE — Telephone Encounter (Signed)
I called pt to discuss. No answer, left a message asking him to call me back. 

## 2018-04-27 NOTE — Telephone Encounter (Signed)
Pt has returned call to RN Kristen, he is asking for a call back °

## 2018-04-29 ENCOUNTER — Encounter: Payer: Self-pay | Admitting: Internal Medicine

## 2018-04-29 ENCOUNTER — Ambulatory Visit (INDEPENDENT_AMBULATORY_CARE_PROVIDER_SITE_OTHER): Payer: Medicare Other | Admitting: Endocrinology

## 2018-04-29 ENCOUNTER — Encounter: Payer: Self-pay | Admitting: Endocrinology

## 2018-04-29 ENCOUNTER — Other Ambulatory Visit (INDEPENDENT_AMBULATORY_CARE_PROVIDER_SITE_OTHER): Payer: Medicare Other

## 2018-04-29 ENCOUNTER — Ambulatory Visit (INDEPENDENT_AMBULATORY_CARE_PROVIDER_SITE_OTHER): Payer: Medicare Other | Admitting: Internal Medicine

## 2018-04-29 VITALS — BP 110/62 | HR 90 | Ht 68.0 in | Wt 197.6 lb

## 2018-04-29 VITALS — BP 120/80 | HR 87 | Ht 68.0 in | Wt 198.2 lb

## 2018-04-29 DIAGNOSIS — J849 Interstitial pulmonary disease, unspecified: Secondary | ICD-10-CM

## 2018-04-29 DIAGNOSIS — Z125 Encounter for screening for malignant neoplasm of prostate: Secondary | ICD-10-CM | POA: Diagnosis not present

## 2018-04-29 DIAGNOSIS — R0602 Shortness of breath: Secondary | ICD-10-CM | POA: Diagnosis not present

## 2018-04-29 DIAGNOSIS — Z0000 Encounter for general adult medical examination without abnormal findings: Secondary | ICD-10-CM | POA: Diagnosis not present

## 2018-04-29 DIAGNOSIS — E785 Hyperlipidemia, unspecified: Secondary | ICD-10-CM | POA: Diagnosis not present

## 2018-04-29 DIAGNOSIS — M810 Age-related osteoporosis without current pathological fracture: Secondary | ICD-10-CM

## 2018-04-29 DIAGNOSIS — I10 Essential (primary) hypertension: Secondary | ICD-10-CM

## 2018-04-29 DIAGNOSIS — E018 Other iodine-deficiency related thyroid disorders and allied conditions: Secondary | ICD-10-CM

## 2018-04-29 LAB — BASIC METABOLIC PANEL
BUN: 17 mg/dL (ref 6–23)
CO2: 29 mEq/L (ref 19–32)
CREATININE: 0.9 mg/dL (ref 0.40–1.50)
Calcium: 9.3 mg/dL (ref 8.4–10.5)
Chloride: 103 mEq/L (ref 96–112)
GFR: 87.77 mL/min (ref >60.00)
GLUCOSE: 95 mg/dL (ref 70–99)
Potassium: 4.4 mEq/L (ref 3.5–5.1)
Sodium: 139 mEq/L (ref 135–145)

## 2018-04-29 LAB — CBC WITH DIFFERENTIAL/PLATELET
Basophils Absolute: 0.1 10*3/uL (ref 0.0–0.1)
Basophils Relative: 0.8 % (ref 0.0–3.0)
EOS PCT: 2.3 % (ref 0.0–5.0)
Eosinophils Absolute: 0.2 10*3/uL (ref 0.0–0.7)
HCT: 41.2 % (ref 39.0–52.0)
Hemoglobin: 13.7 g/dL (ref 13.0–17.0)
LYMPHS ABS: 2.2 10*3/uL (ref 0.7–4.0)
Lymphocytes Relative: 31 % (ref 12.0–46.0)
MCHC: 33.3 g/dL (ref 30.0–36.0)
MCV: 92.1 fl (ref 78.0–100.0)
MONO ABS: 0.8 10*3/uL (ref 0.1–1.0)
Monocytes Relative: 11.2 % (ref 3.0–12.0)
NEUTROS ABS: 3.9 10*3/uL (ref 1.4–7.7)
NEUTROS PCT: 54.7 % (ref 43.0–77.0)
PLATELETS: 238 10*3/uL (ref 150.0–400.0)
RBC: 4.47 Mil/uL (ref 4.22–5.81)
RDW: 14.1 % (ref 11.5–15.5)
WBC: 7.1 10*3/uL (ref 4.0–10.5)

## 2018-04-29 LAB — URINALYSIS, ROUTINE W REFLEX MICROSCOPIC
Bilirubin Urine: NEGATIVE
Hgb urine dipstick: NEGATIVE
KETONES UR: NEGATIVE
Leukocytes, UA: NEGATIVE
Nitrite: NEGATIVE
PH: 6 (ref 5.0–8.0)
RBC / HPF: NONE SEEN (ref 0–?)
Specific Gravity, Urine: 1.025 (ref 1.000–1.030)
TOTAL PROTEIN, URINE-UPE24: NEGATIVE
URINE GLUCOSE: NEGATIVE
UROBILINOGEN UA: 0.2 (ref 0.0–1.0)

## 2018-04-29 LAB — LDL CHOLESTEROL, DIRECT: Direct LDL: 74 mg/dL

## 2018-04-29 LAB — LIPID PANEL
CHOLESTEROL: 140 mg/dL (ref 0–200)
HDL: 49.1 mg/dL (ref >39.00)
NonHDL: 91.26
Total CHOL/HDL Ratio: 3
Triglycerides: 211 mg/dL — ABNORMAL HIGH (ref 0.0–149.0)
VLDL: 42.2 mg/dL — AB (ref 0.0–40.0)

## 2018-04-29 LAB — HEPATIC FUNCTION PANEL
ALBUMIN: 4.2 g/dL (ref 3.5–5.2)
ALT: 16 U/L (ref 0–53)
AST: 17 U/L (ref 0–37)
Alkaline Phosphatase: 53 U/L (ref 39–117)
Bilirubin, Direct: 0.1 mg/dL (ref 0.0–0.3)
TOTAL PROTEIN: 6.9 g/dL (ref 6.0–8.3)
Total Bilirubin: 0.6 mg/dL (ref 0.2–1.2)

## 2018-04-29 LAB — VITAMIN D 25 HYDROXY (VIT D DEFICIENCY, FRACTURES): VITD: 33.2 ng/mL (ref 30.00–100.00)

## 2018-04-29 LAB — TSH: TSH: 0.15 u[IU]/mL — AB (ref 0.35–4.50)

## 2018-04-29 LAB — SEDIMENTATION RATE: Sed Rate: 23 mm/hr — ABNORMAL HIGH (ref 0–20)

## 2018-04-29 MED ORDER — LEVOTHYROXINE SODIUM 125 MCG PO TABS: 125.0000 ug | ORAL_TABLET | Freq: Every day | ORAL | 3 refills | Status: DC

## 2018-04-29 NOTE — Patient Instructions (Addendum)
ICD-10-CM   1. ILD (interstitial lung disease) ([place]) J84.9   2. Shortness of breath R06.02 CT Chest High Resolution     - I am concerned you might have Interstitial Lung Disease (ILD)  -  There are > 100 varieties of this - To narrow down possibilities and assess severity please do the following tests  - do ILD questionnaire   - do Serum: ESR, ACE, ANA, DS-DNA, RF, anti-CCP, ssA, ssB, scl-70, ANCA screen, MPO, PR-3, Total CK,  RNP, Aldolase,  Hypersensitivity Pneumonitis Panel  - do Full PFT  - do HRCT supine and pronne - 2018 ATS protocol  Followup  - return to see me next few to several weeks In ILD clinic to narrow down diagnosis further - might involve biopsy v bronch/bal and advice to get rid off feathered pillows

## 2018-04-29 NOTE — Progress Notes (Signed)
we discussed code status.  pt requests full code, but would not want to be started or maintained on artificial life-support measures if there was not a reasonable chance of recovery 

## 2018-04-29 NOTE — Patient Instructions (Addendum)
blood tests are requested for you today.  We'll let you know about the results.   Please consider these measures for your health:  minimize alcohol.  Do not use tobacco products.  Have a colonoscopy at least every 10 years from age 72. Keep firearms safely stored.  Always use seat belts.  have working smoke alarms in your home.  See an eye doctor and dentist regularly.  Never drive under the influence of alcohol or drugs (including prescription drugs).  Those with fair skin should take precautions against the sun, and should carefully examine their skin once per month, for any new or changed moles.   It is critically important to prevent falling down (keep floor areas well-lit, dry, and free of loose objects.  If you have a cane, walker, or wheelchair, you should use it, even for short trips around the house.  Wear flat-soled shoes.  Also, try not to rush).   Best wishes with your new primary care provider.

## 2018-04-29 NOTE — Progress Notes (Signed)
   Subjective:    Patient ID: Bryan Tyler, male    DOB: 08-07-1946, 72 y.o.   MRN: [id number]  HPI    Review of Systems  Constitutional: Negative for fever and unexpected weight change.  HENT: Positive for congestion and postnasal drip. Negative for dental problem, ear pain, nosebleeds, rhinorrhea, sinus pressure, sneezing, sore throat and trouble swallowing.   Eyes: Negative for redness and itching.  Respiratory: Positive for cough and shortness of breath. Negative for chest tightness and wheezing.   Cardiovascular: Negative for palpitations and leg swelling.  Gastrointestinal: Negative for nausea and vomiting.  Genitourinary: Negative for dysuria.  Musculoskeletal: Positive for joint swelling.  Skin: Negative for rash.  Allergic/Immunologic: Negative.  Negative for environmental allergies, food allergies and immunocompromised state.  Neurological: Negative for headaches.  Hematological: Does not bruise/bleed easily.  Psychiatric/Behavioral: Positive for dysphoric mood. The patient is nervous/anxious.        Objective:   Physical Exam        Assessment & Plan:

## 2018-04-29 NOTE — Progress Notes (Signed)
Subjective:    Patient ID: Bryan Tyler, male    DOB: [birth year], 72 y.o.   MRN: [id number]  HPI  Anxiety: pt says is well-controlled.  This is a stable problem. Hypothyroidism: denies weight change.  This is a stable problem. Dyslipidemia: denies chest pain.  This is a stable problem.  Past Medical History:  Diagnosis Date  . Adenocarcinoma of prostate ([place]) 04/16/2013  . Aneurysm, aorta, thoracic ([place])   . Anxiety   . Arthritis    "knees" (05/21/2017)  . Depression   . Diverticulosis   . Dyspnea    on exertion for a long period time  . GERD (gastroesophageal reflux disease)   . Hyperlipidemia   . Hypertension   . Hypothyroidism, postradioiodine therapy    1980's  . Nocturia   . Organic impotence   . OSA (obstructive sleep apnea)    NO CPAP--  S/P SURGERY  2002  . Osteoporosis   . Pneumonia 1970  . PONV (postoperative nausea and vomiting)   . Urge urinary incontinence   . Wears contact lenses     Past Surgical History:  Procedure Laterality Date  . HEMORRHOIDECTOMY WITH HEMORRHOID BANDING  2007  . INGUINAL HERNIA REPAIR Bilateral    x4  (2 each side)  . KNEE ARTHROSCOPY Right 2012  . NASAL SEPTUM SURGERY  1982   w/ rhinoplasty  . PROSTATE BIOPSY    . RADIOACTIVE SEED IMPLANT N/A 05/30/2013   Procedure: RADIOACTIVE SEED IMPLANT;  Surgeon: Hanley Ben, MD;  Location: [facility];  Service: Urology;  Laterality: N/A;  . REVERSE SHOULDER ARTHROPLASTY Right 05/21/2017  . REVERSE SHOULDER ARTHROPLASTY Right 05/21/2017  . REVERSE SHOULDER ARTHROPLASTY Right 05/21/2017   Procedure: REVERSE RIGHT SHOULDER ARTHROPLASTY;  Surgeon: Justice Britain, MD;  Location: [facility];  Service: Orthopedics;  Laterality: Right;  . RHINOPLASTY  1982   w/w/ septoplasty  . SHOULDER ARTHROSCOPY W/ ROTATOR CUFF REPAIR Right 2004  . SHOULDER ARTHROSCOPY W/ ROTATOR CUFF REPAIR Left 2016  . TONSILLECTOMY  AS CHILD  . UVULOPALATOPHARYNGOPLASTY  2002    Social History    Socioeconomic History  . Marital status: Married    Spouse name: Not on file  . Number of children: Not on file  . Years of education: Not on file  . Highest education level: Not on file  Occupational History  . Occupation: Patent examiner  Social Needs  . Financial resource strain: Not on file  . Food insecurity:    Worry: Not on file    Inability: Not on file  . Transportation needs:    Medical: Not on file    Non-medical: Not on file  Tobacco Use  . Smoking status: Former Smoker    Packs/day: 1.00    Years: 20.00    Pack years: 20.00    Types: Cigarettes    Last attempt to quit: 08/11/1988    Years since quitting: 29.7  . Smokeless tobacco: Never Used  Substance and Sexual Activity  . Alcohol use: Yes    Comment: 05/21/2017 "1-2 beers/month"  . Drug use: No  . Sexual activity: Not Currently  Lifestyle  . Physical activity:    Days per week: Not on file    Minutes per session: Not on file  . Stress: Not on file  Relationships  . Social connections:    Talks on phone: Not on file    Gets together: Not on file    Attends religious service: Not on file  Active member of club or organization: Not on file    Attends meetings of clubs or organizations: Not on file    Relationship status: Not on file  . Intimate partner violence:    Fear of current or ex partner: Not on file    Emotionally abused: Not on file    Physically abused: Not on file    Forced sexual activity: Not on file  Other Topics Concern  . Not on file  Social History Narrative  . Not on file    Current Outpatient Medications on File Prior to Visit  Medication Sig Dispense Refill  . amLODipine (NORVASC) 2.5 MG tablet TAKE 1 TABLET DAILY 30 tablet 0  . ARIPiprazole (ABILIFY) 10 MG tablet Take 5 mg by mouth daily.     Marland Kitchen aspirin 81 MG tablet Take 81 mg by mouth at bedtime.     Marland Kitchen atorvastatin (LIPITOR) 40 MG tablet TAKE 1 TABLET EVERY EVENING 90 tablet 2  . clonazePAM (KLONOPIN) 1 MG tablet  Take 1 tablet by mouth at bedtime.   2  . gabapentin (NEURONTIN) 400 MG capsule Take 800 mg by mouth at bedtime.   2  . losartan (COZAAR) 100 MG tablet TAKE 1 TABLET(100 MG) BY MOUTH DAILY 90 tablet 2  . Multiple Vitamin (MULTIVITAMIN WITH MINERALS) TABS tablet Take 1 tablet by mouth daily.    Marland Kitchen omeprazole (PRILOSEC) 40 MG capsule Take 40 mg by mouth daily as needed (heartburn).    Marland Kitchen oxymetazoline (ANEFRIN NASAL SPRAY) 0.05 % nasal spray Place 1 spray into both nostrils as needed for congestion.     . tamsulosin (FLOMAX) 0.4 MG CAPS capsule Take 1 capsule (0.4 mg total) by mouth daily. (Patient taking differently: Take 0.4 mg by mouth every evening. ) 30 capsule 3  . venlafaxine XR (EFFEXOR-XR) 150 MG 24 hr capsule Take 300 mg by mouth daily with breakfast.     No current facility-administered medications on file prior to visit.     Allergies  Allergen Reactions  . Codeine Nausea And Vomiting and Other (See Comments)    DIZZINESS  . Ciprofloxacin     Family History  Problem Relation Age of Onset  . Cancer Mother        Pancreatic Cancer  . Heart disease Father 48       CABG    BP 110/62 (BP Location: Left Arm, Patient Position: Sitting)   Pulse 90   Ht 5\' 8"  (1.727 m)   Wt 197 lb 9.6 oz (89.6 kg)   SpO2 97%   BMI 30.04 kg/m    Review of Systems Denies sob and leg edema.      Objective:   Physical Exam VITAL SIGNS:  See vs page GENERAL: no distress NECK: There is no palpable thyroid enlargement.  No thyroid nodule is palpable.  No palpable lymphadenopathy at the anterior neck. LUNGS:  Clear to auscultation HEART:  Regular rate and rhythm without murmurs noted. Normal S1,S2.     I personally reviewed electrocardiogram tracing (today):  Indication: HTN.  Impression: NSR.  No MI.  No hypertrophy. Compared to: no significant change.     Assessment & Plan:  Hypothyroidism: due for recheck.  Dyslipidemia: recheck today. Anxiety: well-controlled.  Please continue the  same medications.  Subjective:   Patient here for Medicare annual wellness visit and management of other chronic and acute problems.     Risk factors: advanced age    35 of Physicians Providing Medical Care to Patient:  See "snapshot"   Activities of Daily Living: In your present state of health, do you have any difficulty performing the following activities (lives with wife)?:  Preparing food and eating?: No  Bathing yourself: No  Getting dressed: No  Using the toilet:No  Moving around from place to place: No  In the past year have you fallen or had a near fall?:No    Home Safety: Has smoke detector and wears seat belts. No firearms. No excess sun exposure.    Opioid Use: none   Diet and Exercise  Current exercise habits: pt says good Dietary issues discussed: pt reports a healthy diet   Depression Screen  Q1: Over the past two weeks, have you felt down, depressed or hopeless? no  Q2: Over the past two weeks, have you felt little interest or pleasure in doing things? no   The following portions of the patient's history were reviewed and updated as appropriate: allergies, current medications, past family history, past medical history, past social history, past surgical history and problem list.   Review of Systems  Denies hearing loss, and visual loss Objective:   Vision:  See [facility] done today.  Hearing: grossly normal Body mass index:  See vs page Msk: pt easily and quickly performs "get-up-and-go" from a sitting position Cognitive Impairment Assessment: cognition, memory and judgment appear normal.  remembers 3/3 at 5 minutes.  excellent recall.  can easily read and write a sentence.  alert and oriented x 3.    Assessment:   Medicare wellness utd on preventive parameters    Plan:   During the course of the visit the patient was educated and counseled about appropriate screening and preventive services including:        Fall prevention is advised today  Diabetes  screening  Nutrition counseling is offered  advanced directives/end of life addressed today:  see healthcare directives hyperlink  Vaccines are updated as needed  Patient Instructions (the written plan) was given to the patient.

## 2018-04-29 NOTE — Progress Notes (Signed)
OV 04/29/2018  Subjective:  Patient ID: Bryan Tyler, male , DOB: 05/19/1946 , age 72 y.o. , MRN: [id number] , ADDRESS: [place] [id number]   04/29/2018 -   Chief Complaint  Patient presents with  . Consult    Referred by Dr. Madolyn Frieze for ILD.  Pt states he had problems of SOB with exertion and also when he goes up and down stairs that has been going on x4 years, has a cough that is worse in the mornings and also becomes congested. Denies any CP.     HPI Bryan Tyler 1 y.o. - seen by Dr. Eleanora Neighbor thoracic surgeon in July 2019. Based on his notes review patient had fractures of the left posterior 9-11th rib after a fall. He was also being followed for a 4.3 cm dilation of the ascending aorta for which she is on annual surveillance. CT scan of the chest at this time in July 2019showed ILD changes in the lung bases and the radiologist reported it is progressive since 2013 and therefore patient has been referred here. In talking to patient - [place] ILD Questionnaire:,    - He has been short of breath for 4 years. It is not progressive but it is definitely there This no cough or wheezing. Insidious onset of shortness of breath. No episodic shortness of breath. He has level I-II dyspnea for showering or making the bed or doing laundry but a level III dyspnea for walking up stairs or walking up a hill. 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 Although he does not have a cough he does have constant clearing of the throat   Past medical history" denies asthma, COPD, heart failure, rheumatoid arthritis, scleroderma, lupus, polymyositis, dermatomyositis, Sjogren's, acid reflux, immune system disorder, pulmonary hypertension, diabetes, stroke, seizures, hepatitis, tuberculosis, kidney disease, blood clots or heart disease and pleurisy. She reported positive for some Thyroid disease for the last 30 years and obstructive sleep apnea for  which she had uvuloplasty many years ago and was corrected. Though he reported negative and the question that he told me that he did have acid reflux which is treated successfully with proton pump inhibitors  Review of systems: Positive for fatigue and arthralgia for the last several years. He gets fatigue after work. But no dysphagia no dry eyes, no discoloration of the fingers in cold weather. No recurrent fever no weight loss. No heartburn no snoring no rash no oral ulcers   Family history: In particular lung disease denies any pulmonary fibrosis or COPD or asthma or sarcoidosis of cystic fibrosis or hypersensitivity pneumonitis or autoimmune disease  Personal exposure history: He did smoke cigarettes between [place] around 8 cigarettes per day and quit Does not use any electronic cigarettes. No marijuana use. No cocaine use no intravenous drug use. He lives in a single-family home in the [place] setting for the last 11 years. 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 Lifelong he is used for another pillows and blankets. He believes he was exposed to mold in the school buildings at [employer] but is never worked in a dusty environment. Has been a previous gas leak at homeit otherwise 122 question occupational history is negative.  Medication history: Relevant medicines related to pulmonary toxicity are negative  He's had several CT scans of the chest from 2013including most recent 02/22/2018. None of these a high-resolution CT chest. I personally visualized the CT scans. As best as I can interpret that is bilateral bibasal subpleural basal predominant reticulation. I do not see clear-cut honeycombing. The might be some asymmetry with slightly predominant disease on the right side. TO me findings appear PROGRESSIVE. Marland Kitchen And in terms of 2018 radiologic ATS criteria in  my personal opinion this might be probable UIP given subpleural basilar predominance and some reticular pattern. However high-resolution CT chest as needed.  Lab work: I do not see any serology workup. Hemoglobin in October 2018 normal at 14.5 g percent. In September 2018 and September 2017 slightly high eosinophils at 300 cells per cubic millimeter. ESR in September 2018 was normal at 17. Normal creatinine 0.8 mg percent during this time.  chocardiogram June 2017: Ejection fraction 65%, slightly elevated pulmonary artery systolic pressure 29 mmHg and grade 1 diastolic dysfunction. Trileaflet aortic valve   Simple office walk 185 feet x  3 laps goal with forehead probe 04/29/2018   O2 used Room air  Number laps completed 3 laps  Comments about pace normal  Resting Pulse Ox/HR 98% and 87/min  Final Pulse Ox/HR 98% and 106/min  Desaturated </= 88% no  Desaturated <= 3% points no  Got Tachycardic >/= 90/min yes  Symptoms at end of test no  Miscellaneous comments goold      ROS - per HPI     has a past medical history of Adenocarcinoma of prostate ([place]) (04/16/2013), Aneurysm, aorta, thoracic ([place]), Anxiety, Arthritis, Depression, Diverticulosis, Dyspnea, GERD (gastroesophageal reflux disease), Hyperlipidemia, Hypertension, Hypothyroidism, postradioiodine therapy, Nocturia, Organic impotence, OSA (obstructive sleep apnea), Osteoporosis, Pneumonia (1970), PONV (postoperative nausea and vomiting), Urge urinary incontinence, and Wears contact lenses.   reports that he quit smoking about 29 years ago. His smoking use included cigarettes. He has a 20.00 pack-year smoking history. He has never used smokeless tobacco.  Past Surgical History:  Procedure Laterality Date  . HEMORRHOIDECTOMY WITH HEMORRHOID BANDING  2007  . INGUINAL HERNIA REPAIR Bilateral    x4  (2 each side)  . KNEE ARTHROSCOPY Right 2012  . NASAL SEPTUM SURGERY  1982   w/ rhinoplasty  . PROSTATE BIOPSY    . RADIOACTIVE SEED  IMPLANT N/A 05/30/2013   Procedure: RADIOACTIVE SEED IMPLANT;  Surgeon: Hanley Ben, MD;  Location: [facility];  Service: Urology;  Laterality: N/A;  . REVERSE SHOULDER ARTHROPLASTY Right 05/21/2017  . REVERSE SHOULDER ARTHROPLASTY Right 05/21/2017  . REVERSE SHOULDER ARTHROPLASTY Right 05/21/2017   Procedure: REVERSE RIGHT SHOULDER ARTHROPLASTY;  Surgeon: Justice Britain, MD;  Location: [facility];  Service: Orthopedics;  Laterality: Right;  . RHINOPLASTY  1982   w/w/ septoplasty  . SHOULDER ARTHROSCOPY W/ ROTATOR CUFF REPAIR Right 2004  . SHOULDER ARTHROSCOPY W/ ROTATOR CUFF REPAIR Left 2016  . TONSILLECTOMY  AS CHILD  . UVULOPALATOPHARYNGOPLASTY  2002    Allergies  Allergen Reactions  . Codeine Nausea And Vomiting and Other (See Comments)    DIZZINESS  . Ciprofloxacin     Immunization History  Administered Date(s) Administered  . Hepatitis B 10/21/2007, 11/22/2007, 05/09/2008  . Influenza Split 05/11/2012  . Influenza Whole 06/05/2009, 05/10/2011  . Influenza, High Dose Seasonal PF 05/03/2014, 04/01/2018  . Influenza,inj,Quad PF,6+ Mos 04/11/2016, 04/14/2017  . Pneumococcal Conjugate-13 02/06/2014  . Pneumococcal Polysaccharide-23 10/31/2008, 04/11/2016  . Td 08/11/2006  . Tdap 06/02/2017  . Zoster 11/22/2007    Family  History  Problem Relation Age of Onset  . Cancer Mother        Pancreatic Cancer  . Heart disease Father 44       CABG     Current Outpatient Medications:  .  amLODipine (NORVASC) 2.5 MG tablet, TAKE 1 TABLET DAILY, Disp: 30 tablet, Rfl: 0 .  ARIPiprazole (ABILIFY) 10 MG tablet, Take 5 mg by mouth daily. , Disp: , Rfl:  .  aspirin 81 MG tablet, Take 81 mg by mouth at bedtime. , Disp: , Rfl:  .  atorvastatin (LIPITOR) 40 MG tablet, TAKE 1 TABLET EVERY EVENING, Disp: 90 tablet, Rfl: 2 .  clonazePAM (KLONOPIN) 1 MG tablet, Take 1 tablet by mouth at bedtime. , Disp: , Rfl: 2 .  gabapentin (NEURONTIN) 400 MG capsule, Take 800 mg by mouth at  bedtime. , Disp: , Rfl: 2 .  levothyroxine (SYNTHROID, LEVOTHROID) 137 MCG tablet, Take 1 tablet (137 mcg total) by mouth daily before breakfast., Disp: 90 tablet, Rfl: 3 .  losartan (COZAAR) 100 MG tablet, TAKE 1 TABLET(100 MG) BY MOUTH DAILY, Disp: 90 tablet, Rfl: 2 .  Multiple Vitamin (MULTIVITAMIN WITH MINERALS) TABS tablet, Take 1 tablet by mouth daily., Disp: , Rfl:  .  oxymetazoline (ANEFRIN NASAL SPRAY) 0.05 % nasal spray, Place 1 spray into both nostrils as needed for congestion. , Disp: , Rfl:  .  tamsulosin (FLOMAX) 0.4 MG CAPS capsule, Take 1 capsule (0.4 mg total) by mouth daily. (Patient taking differently: Take 0.4 mg by mouth every evening. ), Disp: 30 capsule, Rfl: 3 .  venlafaxine XR (EFFEXOR-XR) 150 MG 24 hr capsule, Take 300 mg by mouth daily with breakfast., Disp: , Rfl:  .  omeprazole (PRILOSEC) 40 MG capsule, Take 40 mg by mouth daily as needed (heartburn)., Disp: , Rfl:       Objective:   Vitals:   04/29/18 0919  BP: 120/80  Pulse: 87  SpO2: 98%  Weight: 198 lb 3.2 oz (89.9 kg)  Height: '5\' 8"'  (1.727 m)    Estimated body mass index is 30.14 kg/m as calculated from the following:   Height as of this encounter: '5\' 8"'  (1.727 m).   Weight as of this encounter: 198 lb 3.2 oz (89.9 kg).  '@WEIGHTCHANGE' @  [employer]   04/29/18 0919  Weight: 198 lb 3.2 oz (89.9 kg)     Physical Exam  General Appearance:    Alert, cooperative, no distress, appears stated age - looks younger , sitting on - chair , Deconditioned looking - no. Is muscular and looks well  Head:    Normocephalic, without obvious abnormality, atraumatic  Eyes:    PERRL, conjunctiva/corneas clear,  Ears:    Normal TM's and external ear canals, both ears  Nose:   Nares normal, septum midline, mucosa normal, no drainage    or sinus tenderness. OXYGEN ON  - no . Patient is @ ra   Throat:   Lips, mucosa, and tongue normal; teeth and gums normal. Cyanosis on lips - no  Neck:   Supple, symmetrical,  trachea midline, no adenopathy;    thyroid:  no enlargement/tenderness/nodules; no carotid   bruit or JVD  Back:     Symmetric, no curvature, ROM normal, no CVA tenderness  Lungs:     Distress - no , Wheeze no, Barrell Chest - no, Purse lip breathing - no, Crackles - Biltaeral Lower Lobe Crackles  - L > R . Most in left infra-axillay area   Chest Wall:  No tenderness or deformity. Scars in chest no   Heart:    Regular rate and rhythm, S1 and S2 normal, no rub   or gallop, Murmur - no  Breast Exam:    NOT DONE  Abdomen:     Soft, non-tender, bowel sounds active all four quadrants,    no masses, no organomegaly  Genitalia:   NOT DONE  Rectal:   NOT DONE  Extremities:   Extremities normal, atraumatic, Clubbing - no, Edema - no  Pulses:   2+ and symmetric all extremities  Skin:   Stigmata of Connective Tissue Disease - no  Lymph nodes:   Cervical, supraclavicular, and axillary nodes normal  Psychiatric:  Neurologic:   pleasant  CAm-ICU - neg, Alert and Oriented x 3 - yes, Moves all 4s - yes, Speech - normal, Cognition - normal           Assessment:       ICD-10-CM   1. ILD (interstitial lung disease) (HCC) J84.9 Sedimentation rate    ANA    Anti-DNA antibody, double-stranded    CK total and CKMB (cardiac)not at [facility]    Aldolase    ANCA screen with reflex titer    Hypersensitivity pnuemonitis profile    Mpo/pr-3 (anca) antibodies    Rheumatoid factor    RNP Antibodies    Sjogren's syndrome antibods(ssa + ssb)    Angiotensin converting enzyme    Cyclic citrul peptide antibody, IgG    Anti-scleroderma antibody    Pulmonary function test  2. Shortness of breath R06.02 CT Chest High Resolution    And was concerned this is IPF given his Caucasian ethnicity, age greater than 39, my personal opinion that this is probable UIP on cT chest albeit this is non-high-resolution. However a strong differential diagnosis is hypersensitivity chronic pneumonitis. This is based on slow  proprogression over 6 years of radiologic data. 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 ILD (interstitial lung disease) ([place]) J84.9   2. Shortness of breath R06.02 CT Chest High Resolution     - I am concerned you might have Interstitial Lung Disease (ILD)  -  There are > 100 varieties of this - To narrow down possibilities and assess severity please do the following tests  - do ILD questionnaire   - do Serum: ESR, ACE, ANA, DS-DNA, RF, anti-CCP, ssA, ssB, scl-70, ANCA screen, MPO, PR-3, Total CK,  RNP, Aldolase,  Hypersensitivity Pneumonitis Panel  - do Full PFT  - do HRCT supine and pronne - 2018 ATS protocol  Followup  - return to see me next few to several weeks In ILD clinic to narrow down diagnosis further - might involve biopsy v bronch/bal and advice to get rid off feathered pillows     SIGNATURE    Dr. Brand Males, M.D., F.C.C.P,  Pulmonary and Critical Care Medicine Staff Physician, [place] Director - Interstitial Lung Disease  Program  Pulmonary [place] [facility] [place], [place], [address]  Pager: [phone], If no answer or between  15:00h - 7:00h: call [phone] Telephone: [phone]  9:53 AM 04/29/2018

## 2018-04-30 LAB — PTH, INTACT AND CALCIUM
Calcium: 9.4 mg/dL (ref 8.6–10.3)
PTH: 52 pg/mL (ref 14–64)

## 2018-04-30 LAB — RNP ANTIBODIES: ENA RNP Ab: <0.2 AI (ref 0.0–0.9)

## 2018-05-03 ENCOUNTER — Other Ambulatory Visit: Payer: Medicare Other

## 2018-05-03 DIAGNOSIS — J849 Interstitial pulmonary disease, unspecified: Secondary | ICD-10-CM | POA: Diagnosis not present

## 2018-05-04 LAB — ALDOLASE: ALDOLASE: 5 U/L (ref <=8.1)

## 2018-05-05 LAB — ANGIOTENSIN CONVERTING ENZYME: Angiotensin-Converting Enzyme: 24 U/L (ref 9–67)

## 2018-05-05 LAB — CK TOTAL AND CKMB (NOT AT ARMC)
CK TOTAL: 111 U/L (ref 44–196)
CK, MB: 2.9 ng/mL (ref 0–5.0)
Relative Index: 2.6 (ref 0–4.0)

## 2018-05-05 LAB — HYPERSENSITIVITY PNUEMONITIS PROFILE
ASPERGILLUS FUMIGATUS: NEGATIVE
FAENIA RETIVIRGULA: NEGATIVE
PIGEON SERUM: NEGATIVE
S. VIRIDIS: NEGATIVE
T. CANDIDUS: NEGATIVE
T. VULGARIS: NEGATIVE

## 2018-05-05 LAB — ALDOLASE

## 2018-05-05 LAB — SJOGREN'S SYNDROME ANTIBODS(SSA + SSB)
SSA (Ro) (ENA) Antibody, IgG: <1 AI
SSB (La) (ENA) Antibody, IgG: <1 AI

## 2018-05-05 LAB — MPO/PR-3 (ANCA) ANTIBODIES
Myeloperoxidase Abs: <1 AI
Serine Protease 3: <1 AI

## 2018-05-05 LAB — ANTI-DNA ANTIBODY, DOUBLE-STRANDED: ds DNA Ab: <1 IU/mL

## 2018-05-05 LAB — ANTI-SCLERODERMA ANTIBODY: Scleroderma (Scl-70) (ENA) Antibody, IgG: <1 AI

## 2018-05-05 LAB — ANA: ANA: NEGATIVE

## 2018-05-05 LAB — CYCLIC CITRUL PEPTIDE ANTIBODY, IGG: Cyclic Citrullin Peptide Ab: 47 UNITS — ABNORMAL HIGH

## 2018-05-05 LAB — ANCA SCREEN W REFLEX TITER: ANCA SCREEN: NEGATIVE

## 2018-05-05 LAB — RHEUMATOID FACTOR: Rheumatoid fact SerPl-aCnc: <14 [IU]/mL

## 2018-05-06 ENCOUNTER — Ambulatory Visit (HOSPITAL_COMMUNITY)
Admission: RE | Admit: 2018-05-06 | Discharge: 2018-05-06 | Disposition: A | Discharge status: Home / Self Care | Payer: Medicare Other | Source: Ambulatory Visit | Attending: Internal Medicine | Admitting: Internal Medicine

## 2018-05-06 DIAGNOSIS — R0602 Shortness of breath: Secondary | ICD-10-CM | POA: Diagnosis not present

## 2018-05-06 DIAGNOSIS — I2510 Atherosclerotic heart disease of native coronary artery without angina pectoris: Secondary | ICD-10-CM | POA: Diagnosis not present

## 2018-05-06 DIAGNOSIS — I700 Atherosclerosis of aorta: Secondary | ICD-10-CM | POA: Insufficient documentation

## 2018-05-06 DIAGNOSIS — R918 Other nonspecific abnormal finding of lung field: Secondary | ICD-10-CM | POA: Insufficient documentation

## 2018-05-06 DIAGNOSIS — I358 Other nonrheumatic aortic valve disorders: Secondary | ICD-10-CM | POA: Insufficient documentation

## 2018-05-06 DIAGNOSIS — K8020 Calculus of gallbladder without cholecystitis without obstruction: Secondary | ICD-10-CM | POA: Insufficient documentation

## 2018-05-13 DIAGNOSIS — C61 Malignant neoplasm of prostate: Secondary | ICD-10-CM | POA: Diagnosis not present

## 2018-05-24 DIAGNOSIS — C61 Malignant neoplasm of prostate: Secondary | ICD-10-CM | POA: Diagnosis not present

## 2018-05-24 DIAGNOSIS — N5201 Erectile dysfunction due to arterial insufficiency: Secondary | ICD-10-CM | POA: Diagnosis not present

## 2018-05-24 DIAGNOSIS — N3943 Post-void dribbling: Secondary | ICD-10-CM | POA: Diagnosis not present

## 2018-05-27 ENCOUNTER — Encounter: Payer: Self-pay | Admitting: Internal Medicine

## 2018-05-27 ENCOUNTER — Ambulatory Visit (INDEPENDENT_AMBULATORY_CARE_PROVIDER_SITE_OTHER): Payer: Medicare Other | Admitting: Internal Medicine

## 2018-05-27 VITALS — BP 120/78 | HR 90 | Ht 68.0 in | Wt 197.0 lb

## 2018-05-27 DIAGNOSIS — J849 Interstitial pulmonary disease, unspecified: Secondary | ICD-10-CM | POA: Diagnosis not present

## 2018-05-27 DIAGNOSIS — J84112 Idiopathic pulmonary fibrosis: Secondary | ICD-10-CM | POA: Diagnosis not present

## 2018-05-27 LAB — PULMONARY FUNCTION TEST
FEF 25-75 POST: 5.09 L/s
FEF 25-75 Pre: 4.97 L/sec
FEF2575-%Change-Post: 2 %
FEF2575-%PRED-POST: 237 %
FEF2575-%PRED-PRE: 232 %
FEV1-%Change-Post: 0 %
FEV1-%PRED-POST: 97 %
FEV1-%PRED-PRE: 96 %
FEV1-POST: 2.81 L
FEV1-Pre: 2.8 L
FEV1FVC-%Change-Post: 1 %
FEV1FVC-%PRED-PRE: 123 %
FEV6-%Change-Post: -1 %
FEV6-%PRED-POST: 82 %
FEV6-%Pred-Pre: 83 %
FEV6-Post: 3.08 L
FEV6-Pre: 3.12 L
FEV6FVC-%CHANGE-POST: 0 %
FEV6FVC-%Pred-Post: 106 %
FEV6FVC-%Pred-Pre: 106 %
FVC-%Change-Post: -1 %
FVC-%Pred-Post: 77 %
FVC-%Pred-Pre: 78 %
FVC-Post: 3.08 L
FVC-Pre: 3.12 L
POST FEV1/FVC RATIO: 91 %
PRE FEV1/FVC RATIO: 90 %
Post FEV6/FVC ratio: 100 %
Pre FEV6/FVC Ratio: 100 %

## 2018-05-27 NOTE — Patient Instructions (Signed)
ICD-10-CM   1. IPF (idiopathic pulmonary fibrosis) ([facility]) X38.182     - Diagnosis is IPF. Given on 05/27/2018 -Basing this diagnosis because you are a male, age greater than 83, essentially negative history for exposures related to interstitial lung diseases, classic CT scan of the chest with progression over time -Current disease severity is mild -The general outlook is 1 of progression for this disease  Plan -There are many pillars to treatment which include patient support group, research trials, symptom management, pulmonary rehabilitation and anti-fibrotic therapy -Of these today we discussed fibrotic therapy in detail.  We discussed the 2 drugs nintedanib and pirfenidone in extensive detail -We came to a shared decision that he wanted to try nintedanib (ofev)  -Start nintedanib at 150 mg twice daily  -  = Take it with meals   -Apply general habit of applying sunscreen when you go out in the sun   -Okay to recruit patient support group provided by the manufacture  of this drug  Follow-up  -6 weeks to ILD clinic to report course especially side effect tolerance  -In the future we can discuss other pillars of management of this disease

## 2018-05-27 NOTE — Progress Notes (Signed)
OV 04/29/2018  Subjective:  Patient ID: Bryan Tyler, male , DOB: Dec 27, 1945 , age 72 y.o. , MRN: [id number] , ADDRESS: [place] [address]   04/29/2018 -   Chief Complaint  Patient presents with  . Consult    Referred by Dr. Madolyn Frieze for ILD.  Pt states he had problems of SOB with exertion and also when he goes up and down stairs that has been going on x4 years, has a cough that is worse in the mornings and also becomes congested. Denies any CP.     HPI Bryan Tyler 72 y.o. - seen by Dr. Eleanora Neighbor thoracic surgeon in July 2019. Based on his notes review patient had fractures of the left posterior 9-11th rib after a fall. He was also being followed for a 4.3 cm dilation of the ascending aorta for which she is on annual surveillance. CT scan of the chest at this time in July 2019showed ILD changes in the lung bases and the radiologist reported it is progressive since 2013 and therefore patient has been referred here. In talking to patient - [facility] ILD Questionnaire:,    - He has been short of breath for 4 years. It is not progressive but it is definitely there This no cough or wheezing. Insidious onset of shortness of breath. No episodic shortness of breath. He has level I-II dyspnea for showering or making the bed or doing laundry but a level III dyspnea for walking up stairs or walking up a hill. 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 Although he does not have a cough he does have constant clearing of the throat   Past medical history" denies asthma, COPD, heart failure, rheumatoid arthritis, scleroderma, lupus, polymyositis, dermatomyositis, Sjogren's, acid reflux, immune system disorder, pulmonary hypertension, diabetes, stroke, seizures, hepatitis, tuberculosis, kidney disease, blood clots or heart disease and pleurisy. She reported positive for some Thyroid disease for the last 30 years and obstructive sleep apnea for  which she had uvuloplasty many years ago and was corrected. Though he reported negative and the question that he told me that he did have acid reflux which is treated successfully with proton pump inhibitors  Review of systems: Positive for fatigue and arthralgia for the last several years. He gets fatigue after work. But no dysphagia no dry eyes, no discoloration of the fingers in cold weather. No recurrent fever no weight loss. No heartburn no snoring no rash no oral ulcers   Family history: In particular lung disease denies any pulmonary fibrosis or COPD or asthma or sarcoidosis of cystic fibrosis or hypersensitivity pneumonitis or autoimmune disease  Personal exposure history: He did smoke cigarettes between [place] around 8 cigarettes per day and quit Does not use any electronic cigarettes. No marijuana use. No cocaine use no intravenous drug use. He lives in a single-family home in the [place] setting for the last 11 years. 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 Lifelong he is used for another pillows and blankets. He believes he was exposed to mold in the school buildings at [employer] [facility] [facility] but is never worked in a dusty environment. Has been a previous gas leak at homeit otherwise 122 question occupational history is negative.  Medication history: Relevant medicines related to pulmonary toxicity are negative  He's had several CT scans of the  chest from 2013including most recent 02/22/2018. None of these a high-resolution CT chest. I personally visualized the CT scans. As best as I can interpret that is bilateral bibasal subpleural basal predominant reticulation. I do not see clear-cut honeycombing. The might be some asymmetry with slightly predominant disease on the right side. TO me findings appear PROGRESSIVE. Marland Kitchen And in terms of 2018 radiologic ATS criteria in my personal opinion  this might be probable UIP given subpleural basilar predominance and some reticular pattern. However high-resolution CT chest as needed.  Lab work: I do not see any serology workup. Hemoglobin in October 2018 normal at 14.5 g percent. In September 2018 and September 2017 slightly high eosinophils at 300 cells per cubic millimeter. ESR in September 2018 was normal at 17. Normal creatinine 0.8 mg percent during this time.  chocardiogram June 2017: Ejection fraction 65%, slightly elevated pulmonary artery systolic pressure 29 mmHg and grade 1 diastolic dysfunction. Trileaflet aortic valve     OV 05/27/2018  Subjective:  Patient ID: Bryan Tyler, male , DOB: 11-24-1945 , age 72 y.o. , MRN: [id number] , ADDRESS: [place] [address]   05/27/2018 -   Chief Complaint  Patient presents with  . Follow-up    PFT done today, reports breathing is fair      HPI Bryan Tyler 72 y.o. -  Returns for follow-up after evaluation work-up for his interstitial lung disease.  He had high-resolution CT chest that I personally visualized it is classic for UIP.  He had serology.  His ESR is essentially normal.  The only positive serology is a trace positive CCP antibody.  He denies any arthritis.  He had pulmonary function test that shows mild restriction.  However he could not complete DLCO.  We walked him and his walk test is stable.   He said the PFT tech instructions were  Tough and he could not complete. He said he gets claustrophobic in closed rooms. Wanted me to leave door openafter I left    Simple office walk 185 feet x  3 laps goal with forehead probe 04/29/2018  05/27/2018   O2 used Room air Room air  Number laps completed 3 laps 3 laps  Comments about pace normal normal  Resting Pulse Ox/HR 98% and 87/min 100% and 74/min  Final Pulse Ox/HR 98% and 106/min 98% and 98/min  Desaturated </= 88% no no  Desaturated <= 3% points no no  Got Tachycardic >/= 90/min yes  yes  Symptoms at end of test no no  Miscellaneous comments goold stable    IMPRESSION: 1. The appearance of the lungs is compatible with interstitial lung disease, and based on the findings this is classified as ATS UIP (usual interstitial pneumonia). 2. Aortic atherosclerosis, in addition to left main and 3 vessel coronary artery disease. Assessment for potential risk factor modification, dietary therapy or pharmacologic therapy may be warranted, if clinically indicated. 3. There are calcifications of the aortic valve. Echocardiographic correlation for evaluation of potential valvular dysfunction may be warranted if clinically indicated. 4. Cholelithiasis.  Aortic Atherosclerosis (ICD10-I70.0).   Electronically Signed   By: Vinnie Langton M.D.   On: 05/07/2018 09:16   Results for Bryan Tyler, Bryan "CHET" (MRN [id number]) as of 05/27/2018 10:25  Ref. Range 04/29/2018 10:27  ASPERGILLUS FUMIGATUS Latest Ref Range: NEGATIVE  NEGATIVE  [place] Serum Latest Ref Range: NEGATIVE  NEGATIVE  Anti Nuclear Antibody(ANA) Latest Ref Range: NEGATIVE  NEGATIVE  Angiotensin-Converting Enzyme Latest Ref Range: 9 -  67 U/L 24  Cyclic Citrullin Peptide Ab Latest Units: UNITS 47 (H)  ds DNA Ab Latest Units: IU/mL <1  ENA RNP Ab Latest Ref Range: 0.0 - 0.9 AI <0.2  Myeloperoxidase Abs Latest Units: AI <1.0  Serine Protease 3 Latest Units: AI <1.0  RA Latex Turbid. Latest Ref Range: <14 IU/mL <14  SSA (Ro) (ENA) Antibody, IgG Latest Ref Range: <1.0 NEG AI <1.0 NEG  SSB (La) (ENA) Antibody, IgG Latest Ref Range: <1.0 NEG AI <1.0 NEG  Scleroderma (Scl-70) (ENA) Antibody, IgG Latest Ref Range: <1.0 NEG AI <1.0 NEG  Results for Hinde, Bryan E "CHET" (MRN [id number]) as of 05/27/2018 10:25  Ref. Range 03/05/2010 15:18 04/14/2017 14:46 04/29/2018 10:27  Sed Rate Latest Ref Range: 0 - 20 mm/hr '10 17 23 ' (H)   ROS - per HPI     has a past medical history of Adenocarcinoma of prostate ([place] [facility])  (04/16/2013), Aneurysm, aorta, thoracic ([facility]), Anxiety, Arthritis, Depression, Diverticulosis, Dyspnea, GERD (gastroesophageal reflux disease), Hyperlipidemia, Hypertension, Hypothyroidism, postradioiodine therapy, Nocturia, Organic impotence, OSA (obstructive sleep apnea), Osteoporosis, Pneumonia (1970), PONV (postoperative nausea and vomiting), Urge urinary incontinence, and Wears contact lenses.   reports that he quit smoking about 29 years ago. His smoking use included cigarettes. He has a 20.00 pack-year smoking history. He has never used smokeless tobacco.  Past Surgical History:  Procedure Laterality Date  . HEMORRHOIDECTOMY WITH HEMORRHOID BANDING  2007  . INGUINAL HERNIA REPAIR Bilateral    x4  (2 each side)  . KNEE ARTHROSCOPY Right 2012  . NASAL SEPTUM SURGERY  1982   w/ rhinoplasty  . PROSTATE BIOPSY    . RADIOACTIVE SEED IMPLANT N/A 05/30/2013   Procedure: RADIOACTIVE SEED IMPLANT;  Surgeon: Hanley Ben, MD;  Location: [facility];  Service: Urology;  Laterality: N/A;  . REVERSE SHOULDER ARTHROPLASTY Right 05/21/2017  . REVERSE SHOULDER ARTHROPLASTY Right 05/21/2017  . REVERSE SHOULDER ARTHROPLASTY Right 05/21/2017   Procedure: REVERSE RIGHT SHOULDER ARTHROPLASTY;  Surgeon: Justice Britain, MD;  Location: [facility];  Service: Orthopedics;  Laterality: Right;  . RHINOPLASTY  1982   w/w/ septoplasty  . SHOULDER ARTHROSCOPY W/ ROTATOR CUFF REPAIR Right 2004  . SHOULDER ARTHROSCOPY W/ ROTATOR CUFF REPAIR Left 2016  . TONSILLECTOMY  AS CHILD  . UVULOPALATOPHARYNGOPLASTY  2002    Allergies  Allergen Reactions  . Codeine Nausea And Vomiting and Other (See Comments)    DIZZINESS  . Ciprofloxacin     Immunization History  Administered Date(s) Administered  . Hepatitis B 10/21/2007, 11/22/2007, 05/09/2008  . Influenza Split 05/11/2012  . Influenza Whole 06/05/2009, 05/10/2011  . Influenza, High Dose Seasonal PF 05/03/2014, 04/01/2018  . Influenza,inj,Quad PF,6+  Mos 04/11/2016, 04/14/2017  . Influenza,inj,quad, With Preservative 05/11/2017  . Pneumococcal Conjugate-13 02/06/2014  . Pneumococcal Polysaccharide-23 10/31/2008, 04/11/2016  . Td 08/11/2006  . Tdap 06/02/2017  . Zoster 11/22/2007    Family History  Problem Relation Age of Onset  . Cancer Mother        Pancreatic Cancer  . Heart disease Father 106       CABG     Current Outpatient Medications:  .  amLODipine (NORVASC) 2.5 MG tablet, TAKE 1 TABLET DAILY, Disp: 30 tablet, Rfl: 0 .  ARIPiprazole (ABILIFY) 10 MG tablet, Take 5 mg by mouth daily. , Disp: , Rfl:  .  aspirin 81 MG tablet, Take 81 mg by mouth at bedtime. , Disp: , Rfl:  .  atorvastatin (LIPITOR) 40 MG tablet, TAKE 1 TABLET EVERY EVENING,  Disp: 90 tablet, Rfl: 2 .  clonazePAM (KLONOPIN) 1 MG tablet, Take 1 tablet by mouth at bedtime. , Disp: , Rfl: 2 .  gabapentin (NEURONTIN) 400 MG capsule, Take 800 mg by mouth at bedtime. , Disp: , Rfl: 2 .  levothyroxine (SYNTHROID, LEVOTHROID) 125 MCG tablet, Take 1 tablet (125 mcg total) by mouth daily before breakfast., Disp: 90 tablet, Rfl: 3 .  losartan (COZAAR) 100 MG tablet, TAKE 1 TABLET(100 MG) BY MOUTH DAILY, Disp: 90 tablet, Rfl: 2 .  Multiple Vitamin (MULTIVITAMIN WITH MINERALS) TABS tablet, Take 1 tablet by mouth daily., Disp: , Rfl:  .  omeprazole (PRILOSEC) 40 MG capsule, Take 40 mg by mouth daily as needed (heartburn)., Disp: , Rfl:  .  oxymetazoline (ANEFRIN NASAL SPRAY) 0.05 % nasal spray, Place 1 spray into both nostrils as needed for congestion. , Disp: , Rfl:  .  tamsulosin (FLOMAX) 0.4 MG CAPS capsule, Take 1 capsule (0.4 mg total) by mouth daily. (Patient taking differently: Take 0.4 mg by mouth every evening. ), Disp: 30 capsule, Rfl: 3 .  venlafaxine XR (EFFEXOR-XR) 150 MG 24 hr capsule, Take 300 mg by mouth daily with breakfast., Disp: , Rfl:       Objective:   Vitals:   05/27/18 0950  BP: 120/78  Pulse: 90  SpO2: 96%  Weight: 197 lb (89.4 kg)  Height:  '5\' 8"'  (1.727 m)    Estimated body mass index is 29.95 kg/m as calculated from the following:   Height as of this encounter: '5\' 8"'  (1.727 m).   Weight as of this encounter: 197 lb (89.4 kg).  '@WEIGHTCHANGE' @  [employer]   05/27/18 0950  Weight: 197 lb (89.4 kg)     Physical Exam Discussion only          Assessment:       ICD-10-CM   1. IPF (idiopathic pulmonary fibrosis) ([facility]) J84.112     Basing this diagnosis because you are a male, age greater than 46, essentially negative history for exposures related to interstitial lung diseases, classic CT scan of the chest with progression over time. Will discount the trace positive CCP at this point.       Plan:     Patient Instructions     ICD-10-CM   1. IPF (idiopathic pulmonary fibrosis) ([facility]) Z61.096     - Diagnosis is IPF. Given on 05/27/2018 -Basing this diagnosis because you are a male, age greater than 88, essentially negative history for exposures related to interstitial lung diseases, classic CT scan of the chest with progression over time -Current disease severity is mild -The general outlook is 1 of progression for this disease  Plan -There are many pillars to treatment which include patient support group, research trials, symptom management, pulmonary rehabilitation and anti-fibrotic therapy -Of these today we discussed fibrotic therapy in detail.  We discussed the 2 drugs nintedanib and pirfenidone in extensive detail -We came to a shared decision that he wanted to try nintedanib (ofev)  -Start nintedanib at 150 mg twice daily  -  = Take it with meals   -Apply general habit of applying sunscreen when you go out in the sun   -Okay to recruit patient support group provided by the manufacture  of this drug  Follow-up  -6 weeks to ILD clinic to report course especially side effect tolerance  -In the future we can discuss other pillars of management of this disease  - can consider rheum referral at followup  if needed   >  50% of this > 40 min visit spent in face to face counseling or/and coordination of care - by this undersigned MD - Dr Brand Males. This includes one or more of the following documented above: discussion of test results, diagnostic or treatment recommendations, prognosis, risks and benefits of management options, instructions, education, compliance or risk-factor reduction    SIGNATURE    Dr. Brand Males, M.D., F.C.C.P,  Pulmonary and Critical Care Medicine Staff Physician, [place] Director - Interstitial Lung Disease  Program  Pulmonary [place] [facility] [place], [place], [address]  Pager: [phone], If no answer or between  15:00h - 7:00h: call [phone] Telephone: [phone]  10:48 AM 05/27/2018

## 2018-05-27 NOTE — Progress Notes (Signed)
Spirometry pre and post done today. 

## 2018-06-03 ENCOUNTER — Telehealth: Payer: Self-pay | Admitting: Internal Medicine

## 2018-06-03 NOTE — Telephone Encounter (Signed)
Checked to see if we have received a PA form for pt's OFEV and there is no form in our PA folder.  Called [facility] and spoke with Bryan Tyler stating to her that we have not received a form for the PA.  Bryan Tyler stated she would get word to have the form refaxed. I have given the fax number for machine back in triage. Once we have received the fax, we will initiate the PA for pt's OFEV.

## 2018-06-07 ENCOUNTER — Ambulatory Visit (INDEPENDENT_AMBULATORY_CARE_PROVIDER_SITE_OTHER): Payer: Medicare Other | Admitting: Physician Assistant

## 2018-06-07 ENCOUNTER — Encounter: Payer: Self-pay | Admitting: Physician Assistant

## 2018-06-07 ENCOUNTER — Other Ambulatory Visit: Payer: Self-pay

## 2018-06-07 VITALS — BP 104/70 | HR 88 | Temp 97.5°F | Resp 14 | Ht 68.0 in | Wt 194.0 lb

## 2018-06-07 DIAGNOSIS — K061 Gingival enlargement: Secondary | ICD-10-CM

## 2018-06-07 DIAGNOSIS — E018 Other iodine-deficiency related thyroid disorders and allied conditions: Secondary | ICD-10-CM

## 2018-06-07 DIAGNOSIS — I10 Essential (primary) hypertension: Secondary | ICD-10-CM

## 2018-06-07 NOTE — Patient Instructions (Signed)
Please go to the lab today for blood work.  I will call you with your results. We will alter treatment regimen(s) if indicated by your results.   Stop the amlodipine for now. Continue the losartan. Check BP daily and record. Limit salt intake.  Follow-up 2 weeks for reassessment.   For the hip, try taking some tylenol arthritis 1-2 x daily, alternating every other day with an Aleve.   I am rechecking your thyroid function today as it was abnormal on last check with prior PCP.   DASH Eating Plan DASH stands for "Dietary Approaches to Stop Hypertension." The DASH eating plan is a healthy eating plan that has been shown to reduce high blood pressure (hypertension). It may also reduce your risk for type 2 diabetes, heart disease, and stroke. The DASH eating plan may also help with weight loss. What are tips for following this plan? General guidelines  Avoid eating more than 2,300 mg (milligrams) of salt (sodium) a day. If you have hypertension, you may need to reduce your sodium intake to 1,500 mg a day.  Limit alcohol intake to no more than 1 drink a day for nonpregnant women and 2 drinks a day for men. One drink equals 12 oz of beer, 5 oz of wine, or 1 oz of hard liquor.  Work with your health care provider to maintain a healthy body weight or to lose weight. Ask what an ideal weight is for you.  Get at least 30 minutes of exercise that causes your heart to beat faster (aerobic exercise) most days of the week. Activities may include walking, swimming, or biking.  Work with your health care provider or diet and nutrition specialist (dietitian) to adjust your eating plan to your individual calorie needs. Reading food labels  Check food labels for the amount of sodium per serving. Choose foods with less than 5 percent of the Daily Value of sodium. Generally, foods with less than 300 mg of sodium per serving fit into this eating plan.  To find whole grains, look for the word "whole" as the  first word in the ingredient list. Shopping  Buy products labeled as "low-sodium" or "no salt added."  Buy fresh foods. Avoid canned foods and premade or frozen meals. Cooking  Avoid adding salt when cooking. Use salt-free seasonings or herbs instead of table salt or sea salt. Check with your health care provider or pharmacist before using salt substitutes.  Do not fry foods. Cook foods using healthy methods such as baking, boiling, grilling, and broiling instead.  Cook with heart-healthy oils, such as olive, canola, soybean, or sunflower oil. Meal planning   Eat a balanced diet that includes: ? 5 or more servings of fruits and vegetables each day. At each meal, try to fill half of your plate with fruits and vegetables. ? Up to 6-8 servings of whole grains each day. ? Less than 6 oz of lean meat, poultry, or fish each day. A 3-oz serving of meat is about the same size as a deck of cards. One egg equals 1 oz. ? 2 servings of low-fat dairy each day. ? A serving of nuts, seeds, or beans 5 times each week. ? Heart-healthy fats. Healthy fats called Omega-3 fatty acids are found in foods such as flaxseeds and coldwater fish, like sardines, salmon, and mackerel.  Limit how much you eat of the following: ? Canned or prepackaged foods. ? Food that is high in trans fat, such as fried foods. ? Food that is  high in saturated fat, such as fatty meat. ? Sweets, desserts, sugary drinks, and other foods with added sugar. ? Full-fat dairy products.  Do not salt foods before eating.  Try to eat at least 2 vegetarian meals each week.  Eat more home-cooked food and less restaurant, buffet, and fast food.  When eating at a restaurant, ask that your food be prepared with less salt or no salt, if possible. What foods are recommended? The items listed may not be a complete list. Talk with your dietitian about what dietary choices are best for you. Grains Whole-grain or whole-wheat bread. Whole-grain  or whole-wheat pasta. Brown rice. [place]. Bulgur. Whole-grain and low-sodium cereals. Pita bread. Low-fat, low-sodium crackers. Whole-wheat flour tortillas. Vegetables Fresh or frozen vegetables (raw, steamed, roasted, or grilled). Low-sodium or reduced-sodium tomato and vegetable juice. Low-sodium or reduced-sodium tomato sauce and tomato paste. Low-sodium or reduced-sodium canned vegetables. Fruits All fresh, dried, or frozen fruit. Canned fruit in natural juice (without added sugar). Meat and other protein foods Skinless chicken or [place]. Ground chicken or [place]. Pork with fat trimmed off. Fish and seafood. Egg whites. Dried beans, peas, or lentils. Unsalted nuts, nut butters, and seeds. Unsalted canned beans. Lean cuts of beef with fat trimmed off. Low-sodium, lean deli meat. Dairy Low-fat (1%) or fat-free (skim) milk. Fat-free, low-fat, or reduced-fat cheeses. Nonfat, low-sodium ricotta or cottage cheese. Low-fat or nonfat yogurt. Low-fat, low-sodium cheese. Fats and oils Soft margarine without trans fats. Vegetable oil. Low-fat, reduced-fat, or light mayonnaise and salad dressings (reduced-sodium). Canola, safflower, olive, soybean, and sunflower oils. Avocado. Seasoning and other foods Herbs. Spices. Seasoning mixes without salt. Unsalted popcorn and pretzels. Fat-free sweets. What foods are not recommended? The items listed may not be a complete list. Talk with your dietitian about what dietary choices are best for you. Grains Baked goods made with fat, such as croissants, muffins, or some breads. Dry pasta or rice meal packs. Vegetables Creamed or fried vegetables. Vegetables in a cheese sauce. Regular canned vegetables (not low-sodium or reduced-sodium). Regular canned tomato sauce and paste (not low-sodium or reduced-sodium). Regular tomato and vegetable juice (not low-sodium or reduced-sodium). Angie Fava. Olives. Fruits Canned fruit in a light or heavy syrup. Fried fruit.  Fruit in cream or butter sauce. Meat and other protein foods Fatty cuts of meat. Ribs. Fried meat. Berniece Salines. Sausage. Bologna and other processed lunch meats. Salami. Fatback. Hotdogs. Bratwurst. Salted nuts and seeds. Canned beans with added salt. Canned or smoked fish. Whole eggs or egg yolks. Chicken or [place] with skin. Dairy Whole or 2% milk, cream, and half-and-half. Whole or full-fat cream cheese. Whole-fat or sweetened yogurt. Full-fat cheese. Nondairy creamers. Whipped toppings. Processed cheese and cheese spreads. Fats and oils Butter. Stick margarine. Lard. Shortening. Ghee. Bacon fat. Tropical oils, such as coconut, palm kernel, or palm oil. Seasoning and other foods Salted popcorn and pretzels. Onion salt, garlic salt, seasoned salt, table salt, and sea salt. Worcestershire sauce. Tartar sauce. Barbecue sauce. Teriyaki sauce. Soy sauce, including reduced-sodium. Steak sauce. Canned and packaged gravies. Fish sauce. Oyster sauce. Cocktail sauce. Horseradish that you find on the shelf. Ketchup. Mustard. Meat flavorings and tenderizers. Bouillon cubes. Hot sauce and Tabasco sauce. Premade or packaged marinades. Premade or packaged taco seasonings. Relishes. Regular salad dressings. Where to find more information:  National Heart, Lung, and [place]: https://wilson-eaton.com/  American Heart Association: www.heart.org Summary  The DASH eating plan is a healthy eating plan that has been shown to reduce high blood pressure (hypertension). It  may also reduce your risk for type 2 diabetes, heart disease, and stroke.  With the DASH eating plan, you should limit salt (sodium) intake to 2,300 mg a day. If you have hypertension, you may need to reduce your sodium intake to 1,500 mg a day.  When on the DASH eating plan, aim to eat more fresh fruits and vegetables, whole grains, lean proteins, low-fat dairy, and heart-healthy fats.  Work with your health care provider or diet and nutrition  specialist (dietitian) to adjust your eating plan to your individual calorie needs. This information is not intended to replace advice given to you by your health care provider. Make sure you discuss any questions you have with your health care provider. Document Released: 07/17/2011 Document Revised: 07/21/2016 Document Reviewed: 07/21/2016 Elsevier Interactive Patient Education  [employer].

## 2018-06-07 NOTE — Progress Notes (Signed)
Patient presents to clinic today to transfer care from Dr. Loanne Drilling for primary care.   Patient with a history of hypertension, currently on a regimen of losartan and spironolactone. Endorses taking medications as directed. Is currently seeing oral surgeon for significant issue with gingival hyperplasia. Is felt that is stemming from the Spironolactone that he is currently taking. Was instructed by specialist to discuss alternatives for this medication. Patient denies chest pain, palpitations, lightheadedness, dizziness, vision changes or frequent headaches.  BP Readings from Last 3 Encounters:  06/07/18 104/70  05/27/18 120/78  04/29/18 110/62   Health Maintenance: Immunizations --up-to-date.  Colonoscopy --  Up-to-date. Last in 2016 per patient. We are obtaining records for [facility].  Past Medical History:  Diagnosis Date  . Adenocarcinoma of prostate ([place]) 04/16/2013  . Aneurysm, aorta, thoracic ([place])   . Anxiety   . Arthritis    "knees" (05/21/2017)  . Depression   . Diverticulosis   . Dyspnea    on exertion for a long period time  . GERD (gastroesophageal reflux disease)   . Hyperlipidemia   . Hypertension   . Hypothyroidism, postradioiodine therapy    1980's  . Nocturia   . Organic impotence   . OSA (obstructive sleep apnea)    NO CPAP--  S/P SURGERY  2002  . Osteoporosis   . Pneumonia 1970  . PONV (postoperative nausea and vomiting)   . Urge urinary incontinence   . Wears contact lenses     Past Surgical History:  Procedure Laterality Date  . HEMORRHOIDECTOMY WITH HEMORRHOID BANDING  2007  . INGUINAL HERNIA REPAIR Bilateral    x4  (2 each side)  . KNEE ARTHROSCOPY Right 2012  . NASAL SEPTUM SURGERY  1982   w/ rhinoplasty  . PROSTATE BIOPSY    . RADIOACTIVE SEED IMPLANT N/A 05/30/2013   Procedure: RADIOACTIVE SEED IMPLANT;  Surgeon: Hanley Ben, MD;  Location: [place];  Service: Urology;  Laterality: N/A;  . REVERSE SHOULDER  ARTHROPLASTY Right 05/21/2017  . REVERSE SHOULDER ARTHROPLASTY Right 05/21/2017  . REVERSE SHOULDER ARTHROPLASTY Right 05/21/2017   Procedure: REVERSE RIGHT SHOULDER ARTHROPLASTY;  Surgeon: Justice Britain, MD;  Location: [place];  Service: Orthopedics;  Laterality: Right;  . RHINOPLASTY  1982   w/w/ septoplasty  . SHOULDER ARTHROSCOPY W/ ROTATOR CUFF REPAIR Right 2004  . SHOULDER ARTHROSCOPY W/ ROTATOR CUFF REPAIR Left 2016  . TONSILLECTOMY  AS CHILD  . UVULOPALATOPHARYNGOPLASTY  2002    Current Outpatient Medications on File Prior to Visit  Medication Sig Dispense Refill  . amLODipine (NORVASC) 2.5 MG tablet TAKE 1 TABLET DAILY 30 tablet 0  . ARIPiprazole (ABILIFY) 10 MG tablet Take 5 mg by mouth daily.     Marland Kitchen aspirin 81 MG tablet Take 81 mg by mouth at bedtime.     Marland Kitchen atorvastatin (LIPITOR) 40 MG tablet TAKE 1 TABLET EVERY EVENING 90 tablet 2  . busPIRone (BUSPAR) 15 MG tablet Take 15 mg by mouth 2 (two) times daily.    . clonazePAM (KLONOPIN) 1 MG tablet Take 1 tablet by mouth at bedtime.   2  . gabapentin (NEURONTIN) 400 MG capsule Take 800 mg by mouth at bedtime.   2  . levothyroxine (SYNTHROID, LEVOTHROID) 125 MCG tablet Take 1 tablet (125 mcg total) by mouth daily before breakfast. 90 tablet 3  . losartan (COZAAR) 100 MG tablet TAKE 1 TABLET(100 MG) BY MOUTH DAILY 90 tablet 2  . Multiple Vitamin (MULTIVITAMIN WITH MINERALS) TABS tablet Take 1  tablet by mouth daily.    Marland Kitchen omeprazole (PRILOSEC) 40 MG capsule Take 40 mg by mouth daily as needed (heartburn).    Marland Kitchen oxymetazoline (ANEFRIN NASAL SPRAY) 0.05 % nasal spray Place 1 spray into both nostrils as needed for congestion.     . tamsulosin (FLOMAX) 0.4 MG CAPS capsule Take 1 capsule (0.4 mg total) by mouth daily. (Patient taking differently: Take 0.4 mg by mouth every evening. ) 30 capsule 3  . venlafaxine XR (EFFEXOR-XR) 150 MG 24 hr capsule Take 300 mg by mouth daily with breakfast.     No current facility-administered medications on  file prior to visit.     Allergies  Allergen Reactions  . Codeine Nausea And Vomiting and Other (See Comments)    DIZZINESS  . Ciprofloxacin     Family History  Problem Relation Age of Onset  . Cancer Mother        Pancreatic Cancer  . Heart disease Father 55       CABG    Social History   Socioeconomic History  . Marital status: Married    Spouse name: Not on file  . Number of children: Not on file  . Years of education: Not on file  . Highest education level: Not on file  Occupational History  . Occupation: Patent examiner  Social Needs  . Financial resource strain: Not on file  . Food insecurity:    Worry: Not on file    Inability: Not on file  . Transportation needs:    Medical: Not on file    Non-medical: Not on file  Tobacco Use  . Smoking status: Former Smoker    Packs/day: 1.00    Years: 20.00    Pack years: 20.00    Types: Cigarettes    Last attempt to quit: 08/11/1988    Years since quitting: 29.8  . Smokeless tobacco: Never Used  Substance and Sexual Activity  . Alcohol use: Yes    Comment: 05/21/2017 "1-2 beers/month"  . Drug use: No  . Sexual activity: Not Currently  Lifestyle  . Physical activity:    Days per week: Not on file    Minutes per session: Not on file  . Stress: Not on file  Relationships  . Social connections:    Talks on phone: Not on file    Gets together: Not on file    Attends religious service: Not on file    Active member of club or organization: Not on file    Attends meetings of clubs or organizations: Not on file    Relationship status: Not on file  . Intimate partner violence:    Fear of current or ex partner: Not on file    Emotionally abused: Not on file    Physically abused: Not on file    Forced sexual activity: Not on file  Other Topics Concern  . Not on file  Social History Narrative  . Not on file   Review of Systems  Constitutional: Negative for fever, malaise/fatigue and weight loss.  Eyes:  Negative for blurred vision and double vision.  Cardiovascular: Negative for chest pain and palpitations.  Musculoskeletal: Negative for back pain, falls, joint pain, myalgias and neck pain.  Neurological: Negative for dizziness and loss of consciousness.  Psychiatric/Behavioral: Negative for depression.   BP 104/70   Pulse 88   Temp (!) 97.5 F (36.4 C) (Oral)   Resp 14   Ht 5\' 8"  (1.727 m)   Wt 194 lb (  88 kg)   SpO2 98%   BMI 29.50 kg/m   Physical Exam  Constitutional: He is oriented to person, place, and time. He appears well-developed and well-nourished.  HENT:  Head: Normocephalic and atraumatic.  Significant gingival hyperplasia noted on examination  Cardiovascular: Normal rate, regular rhythm, normal heart sounds and intact distal pulses.  Pulmonary/Chest: Effort normal and breath sounds normal. No stridor. No respiratory distress. He has no wheezes. He has no rales. He exhibits no tenderness.  Neurological: He is alert and oriented to person, place, and time.  Psychiatric: He has a normal mood and affect.  Vitals reviewed.   Recent Results (from the past 2160 hour(s))  Anti-scleroderma antibody     Status: None   Collection Time: 04/29/18 10:27 AM  Result Value Ref Range   Scleroderma (Scl-70) (ENA) Antibody, IgG <1.0 NEG <2.4 NEG AI  Cyclic citrul peptide antibody, IgG     Status: Abnormal   Collection Time: 04/29/18 10:27 AM  Result Value Ref Range   Cyclic Citrullin Peptide Ab 47 (H) UNITS    Comment: Reference Range Negative:            <20 Weak Positive:       20-39 Moderate Positive:   40-59 Strong Positive:     >59 .   Angiotensin converting enzyme     Status: None   Collection Time: 04/29/18 10:27 AM  Result Value Ref Range   Angiotensin-Converting Enzyme 24 9 - 67 U/L  Sjogren's syndrome antibods(ssa + ssb)     Status: None   Collection Time: 04/29/18 10:27 AM  Result Value Ref Range   SSA (Ro) (ENA) Antibody, IgG <1.0 NEG <1.0 NEG AI   SSB (La)  (ENA) Antibody, IgG <1.0 NEG <1.0 NEG AI  RNP Antibodies     Status: None   Collection Time: 04/29/18 10:27 AM  Result Value Ref Range   ENA RNP Ab <0.2 0.0 - 0.9 AI  Rheumatoid factor     Status: None   Collection Time: 04/29/18 10:27 AM  Result Value Ref Range   Rhuematoid fact SerPl-aCnc <14 <14 IU/mL  Mpo/pr-3 (anca) antibodies     Status: None   Collection Time: 04/29/18 10:27 AM  Result Value Ref Range   Myeloperoxidase Abs <1.0 AI    Comment:      Value        Interpretation      -----        --------------      <1.0         No Antibody Detected      > or = 1.0   Antibody Detected . Autoantibodies to myeloperoxidase (MPO) are commonly associated with the following small-vessel vasculitides: microscopic polyangiitis, polyarteritis nodosa, Churg-Strauss syndrome, necrotizing and crescentic glomerulonephritis and occasionally granulomatosis with polyangiitis (GPA, Wegener's). The perinuclear IFA pattern, (p-ANCA) is based largely on autoantibody to  myeloperoxidase which serves as the primary antigen. These autoantibodies are present in active disease. .    Serine Protease 3 <1.0 AI    Comment:      Value        Interpretation      -----        --------------      <1.0         No Antibody Detected      > or = 1.0   Antibody Detected . Autoantibodies to proteinase-3 (PR-3) are accepted as characteristic for granulomatosis with polyangiitis (GPA, Wegener's), and are detectable in 95%  of the histologically proven cases. The cytoplasmic IFA pattern, (c-ANCA), is based largely on autoantibody to PR-3 which serves as the primary antigen. These autoantibodies are present in active disease. Marland Kitchen   Hypersensitivity pnuemonitis profile     Status: None   Collection Time: 04/29/18 10:27 AM  Result Value Ref Range   ASPERGILLUS FUMIGATUS NEGATIVE NEGATIVE   [place] retivirgula NEGATIVE NEGATIVE   [place] Serum NEGATIVE NEGATIVE   T. CANDIDUS NEGATIVE NEGATIVE   T. VULGARIS  NEGATIVE NEGATIVE   S. VIRIDIS NEGATIVE NEGATIVE    Comment: . This test was developed and its analytical performance characteristics have been determined by [facility]. It has not been cleared or approved by FDA. This assay has been validated pursuant to the CLIA regulations and is used for clinical purposes.   ANCA screen with reflex titer     Status: None   Collection Time: 04/29/18 10:27 AM  Result Value Ref Range   ANCA Screen NEGATIVE NEGATIVE    Comment: ANCA Screen includes evaluation for p-ANCA, c-ANCA and atypical p-ANCA. A positive ANCA screen reflexes to titer and pattern(s), e.g., cytoplasmic pattern (c-ANCA), perinuclear pattern (p-ANCA), or atypical p-ANCA pattern.  c-ANCA and p-ANCA are observed in vasculitis, whereas atypical p-ANCA is observed in IBD (Inflammatory Bowel Disease). Atypical p-ANCA is  detected in about 55% to 80% of patients with ulcerative colitis but only 5% to 25% of patients with Crohn's disease. .   Aldolase     Status: None   Collection Time: 04/29/18 10:27 AM  Result Value Ref Range   Aldolase CANCELED     Comment: Test Not Performed. A single specimen was submitted and cannot be shared for multiple tests.  Result canceled by the ancillary.   CK total and CKMB (cardiac)not at [facility]     Status: None   Collection Time: 04/29/18 10:27 AM  Result Value Ref Range   Total CK 111 44 - 196 U/L   CK, MB 2.9 0 - 5.0 ng/mL   Relative Index 2.6 0 - 4.0  Anti-DNA antibody, double-stranded     Status: None   Collection Time: 04/29/18 10:27 AM  Result Value Ref Range   ds DNA Ab <1 IU/mL    Comment:                            IU/mL       Interpretation                            < or = 4    Negative                            5-9         Indeterminate                            > or = 10   Positive .   ANA     Status: None   Collection Time: 04/29/18 10:27 AM  Result Value Ref Range   Anti  Nuclear Antibody(ANA) NEGATIVE NEGATIVE    Comment: ANA IFA is a first line screen for detecting the presence of up to approximately 150 autoantibodies in various autoimmune diseases. A negative ANA IFA result suggests an ANA-associated autoimmune disease is not present at this time, but  is not definitive. If there is high clinical suspicion for Sjogren's syndrome, testing for anti-SS-A/Ro antibody should be considered. Anti-Jo-1 antibody should be considered for clinically suspected inflammatory myopathies. . AC-0: Negative . International Consensus on ANA Patterns (https://www.hernandez-brewer.com/) . For additional information, please refer to http://education.[facility].com/faq/FAQ177 (This link is being provided for informational/ educational purposes only.) .   Sedimentation rate     Status: Abnormal   Collection Time: 04/29/18 10:27 AM  Result Value Ref Range   Sed Rate 23 (H) 0 - 20 mm/hr  PTH, intact and calcium     Status: None   Collection Time: 04/29/18  1:55 PM  Result Value Ref Range   PTH 52 14 - 64 pg/mL    Comment: . Interpretive Guide    Intact PTH           Calcium ------------------    ----------           ------- Normal Parathyroid    Normal               Normal Hypoparathyroidism    Low or Low Normal    Low Hyperparathyroidism    Primary            Normal or High       High    Secondary          High                 Normal or Low    Tertiary           High                 High Non-Parathyroid    Hypercalcemia      Low or Low Normal    High .    Calcium 9.4 8.6 - 10.3 mg/dL  VITAMIN D 25 Hydroxy (Vit-D Deficiency, Fractures)     Status: None   Collection Time: 04/29/18  1:55 PM  Result Value Ref Range   VITD 33.20 30.00 - 100.00 ng/mL  Urinalysis, Routine w reflex microscopic     Status: None   Collection Time: 04/29/18  1:55 PM  Result Value Ref Range   Color, Urine YELLOW Yellow;Lt. Yellow   APPearance CLEAR Clear   Specific  Gravity, Urine 1.025 1.000 - 1.030   pH 6.0 5.0 - 8.0   Total Protein, Urine NEGATIVE Negative   Urine Glucose NEGATIVE Negative   Ketones, ur NEGATIVE Negative   Bilirubin Urine NEGATIVE Negative   Hgb urine dipstick NEGATIVE Negative   Urobilinogen, UA 0.2 0.0 - 1.0   Leukocytes, UA NEGATIVE Negative   Nitrite NEGATIVE Negative   WBC, UA 0-2/hpf 0-2/hpf   RBC / HPF none seen 0-2/hpf  Hepatic function panel     Status: None   Collection Time: 04/29/18  1:55 PM  Result Value Ref Range   Total Bilirubin 0.6 0.2 - 1.2 mg/dL   Bilirubin, Direct 0.1 0.0 - 0.3 mg/dL   Alkaline Phosphatase 53 39 - 117 U/L   AST 17 0 - 37 U/L   ALT 16 0 - 53 U/L   Total Protein 6.9 6.0 - 8.3 g/dL   Albumin 4.2 3.5 - 5.2 g/dL  CBC with Differential/Platelet     Status: None   Collection Time: 04/29/18  1:55 PM  Result Value Ref Range   WBC 7.1 4.0 - 10.5 K/uL   RBC 4.47 4.22 - 5.81 Mil/uL   Hemoglobin 13.7 13.0 - 17.0 g/dL   HCT  41.2 39.0 - 52.0 %   MCV 92.1 78.0 - 100.0 fl   MCHC 33.3 30.0 - 36.0 g/dL   RDW 14.1 11.5 - 15.5 %   Platelets 238.0 150.0 - 400.0 K/uL   Neutrophils Relative % 54.7 43.0 - 77.0 %   Lymphocytes Relative 31.0 12.0 - 46.0 %   Monocytes Relative 11.2 3.0 - 12.0 %   Eosinophils Relative 2.3 0.0 - 5.0 %   Basophils Relative 0.8 0.0 - 3.0 %   Neutro Abs 3.9 1.4 - 7.7 K/uL   Lymphs Abs 2.2 0.7 - 4.0 K/uL   Monocytes Absolute 0.8 0.1 - 1.0 K/uL   Eosinophils Absolute 0.2 0.0 - 0.7 K/uL   Basophils Absolute 0.1 0.0 - 0.1 K/uL  Lipid panel     Status: Abnormal   Collection Time: 04/29/18  1:55 PM  Result Value Ref Range   Cholesterol 140 0 - 200 mg/dL    Comment: ATP III Classification       Desirable:  < 200 mg/dL               Borderline High:  200 - 239 mg/dL          High:  > = 240 mg/dL   Triglycerides 211.0 (H) 0.0 - 149.0 mg/dL    Comment: Normal:  <150 mg/dLBorderline High:  150 - 199 mg/dL   HDL 49.10 >39.00 mg/dL   VLDL 42.2 (H) 0.0 - 40.0 mg/dL   Total CHOL/HDL  Ratio 3     Comment:                Men          Women1/2 Average Risk     3.4          3.3Average Risk          5.0          4.42X Average Risk          9.6          7.13X Average Risk          15.0          11.0                       NonHDL 91.26     Comment: NOTE:  Non-HDL goal should be 30 mg/dL higher than patient's LDL goal (i.e. LDL goal of < 70 mg/dL, would have non-HDL goal of < 100 mg/dL)  Basic metabolic panel     Status: None   Collection Time: 04/29/18  1:55 PM  Result Value Ref Range   Sodium 139 135 - 145 mEq/L   Potassium 4.4 3.5 - 5.1 mEq/L   Chloride 103 96 - 112 mEq/L   CO2 29 19 - 32 mEq/L   Glucose, Bld 95 70 - 99 mg/dL   BUN 17 6 - 23 mg/dL   Creatinine, Ser 0.90 0.40 - 1.50 mg/dL   Calcium 9.3 8.4 - 10.5 mg/dL   GFR 87.77 >60.00 mL/min  TSH     Status: Abnormal   Collection Time: 04/29/18  1:55 PM  Result Value Ref Range   TSH 0.15 (L) 0.35 - 4.50 uIU/mL  LDL cholesterol, direct     Status: None   Collection Time: 04/29/18  1:55 PM  Result Value Ref Range   Direct LDL 74.0 mg/dL    Comment: Optimal:  <100 mg/dLNear or Above Optimal:  100-129 mg/dLBorderline High:  130-159 mg/dLHigh:  160-189 mg/dLVery High:  >190 mg/dL  Aldolase     Status: None   Collection Time: 05/03/18  1:00 PM  Result Value Ref Range   Aldolase 5.0 < OR = 8.1 U/L  Pulmonary function test     Status: None (Preliminary result)   Collection Time: 05/27/18  8:32 AM  Result Value Ref Range   FVC-Pre 3.12 L   FVC-%Pred-Pre 78 %   FVC-Post 3.08 L   FVC-%Pred-Post 77 %   FVC-%Change-Post -1 %   FEV1-Pre 2.80 L   FEV1-%Pred-Pre 96 %   FEV1-Post 2.81 L   FEV1-%Pred-Post 97 %   FEV1-%Change-Post 0 %   FEV6-Pre 3.12 L   FEV6-%Pred-Pre 83 %   FEV6-Post 3.08 L   FEV6-%Pred-Post 82 %   FEV6-%Change-Post -1 %   Pre FEV1/FVC ratio 90 %   FEV1FVC-%Pred-Pre 123 %   Post FEV1/FVC ratio 91 %   FEV1FVC-%Change-Post 1 %   Pre FEV6/FVC Ratio 100 %   FEV6FVC-%Pred-Pre 106 %   Post FEV6/FVC  ratio 100 %   FEV6FVC-%Pred-Post 106 %   FEV6FVC-%Change-Post 0 %   FEF 25-75 Pre 4.97 L/sec   FEF2575-%Pred-Pre 232 %   FEF 25-75 Post 5.09 L/sec   FEF2575-%Pred-Post 237 %   FEF2575-%Change-Post 2 %    Assessment/Plan: 1. HYPOTHYROIDISM, POST-RADIOACTIVE IODINE Last TSH low. No notation by prior PCP seen on results on in EMR. Will recheck TSH level today. Will alter thyroid medication accordingly.  - TSH  2. Essential hypertension BP lower end of normal today. Will stop Spironolactone. Continue losartan daily along with GERD diet. Check home BP. Follow-up 2 weeks for reassessment of BP.  3. Gingival hyperplasia Will stop Spironolactone as is likely contributor. Follow-up with oral surgeon as scheduled.    Leeanne Rio, PA-C

## 2018-06-08 ENCOUNTER — Telehealth: Payer: Self-pay | Admitting: Internal Medicine

## 2018-06-08 ENCOUNTER — Ambulatory Visit: Payer: Self-pay | Admitting: Psychiatry

## 2018-06-08 LAB — TSH: TSH: 0.28 u[IU]/mL — ABNORMAL LOW (ref 0.35–4.50)

## 2018-06-08 NOTE — Telephone Encounter (Signed)
We did receive the fax from [employer] for the PA for [place].  The information has partially been filled out but need to be finished by MR. Form has been placed in MR's urgent sign and review folder.   Once form has been received back from MR, we will fax it in.  Called [facility] and spoke with Almyra Free letting her know that we were waiting on MR to finish completing the form and sign so we could get it faxed back.  Routing to MR as an Pharmacist, hospital. This form for the PA for pt's OFEV is in your purple sign and review folder. There are a couple places on there that you need to finish filling out and then sign the form.

## 2018-06-10 ENCOUNTER — Telehealth: Payer: Self-pay | Admitting: Physician Assistant

## 2018-06-10 DIAGNOSIS — E018 Other iodine-deficiency related thyroid disorders and allied conditions: Secondary | ICD-10-CM

## 2018-06-10 NOTE — Telephone Encounter (Signed)
Copied from [place] [phone]. Topic: General - Inquiry >> Jun 10, 2018  4:25 PM Blase Mess A wrote: Reason for CRM: Patient is agreeable to the  Levothyroxine to 112 mcg daily.he asked if the medicine could be sent to [place], [place] [address] Phone: [phone] Fax: [phone]

## 2018-06-10 NOTE — Telephone Encounter (Signed)
Filled and given to Wallace

## 2018-06-11 ENCOUNTER — Other Ambulatory Visit: Payer: Self-pay | Admitting: Physician Assistant

## 2018-06-11 MED ORDER — LEVOTHYROXINE SODIUM 112 MCG PO TABS: 112.0000 ug | ORAL_TABLET | Freq: Every day | ORAL | 1 refills | Status: DC

## 2018-06-11 NOTE — Addendum Note (Signed)
Addended by: Katina Dung on: 06/11/2018 10:20 AM   Modules accepted: Orders

## 2018-06-11 NOTE — Telephone Encounter (Signed)
Patient has been made aware, TSH ordered and appt scheduled.

## 2018-06-11 NOTE — Telephone Encounter (Signed)
Form has been faxed. Nothing further is needed at this time.

## 2018-06-11 NOTE — Telephone Encounter (Signed)
Rx sent in 30 and 1 refill. Needs repeat TSH in 6 weeks.

## 2018-06-16 ENCOUNTER — Ambulatory Visit (INDEPENDENT_AMBULATORY_CARE_PROVIDER_SITE_OTHER): Payer: Medicare Other | Admitting: Psychiatry

## 2018-06-16 DIAGNOSIS — F3342 Major depressive disorder, recurrent, in full remission: Secondary | ICD-10-CM | POA: Diagnosis not present

## 2018-06-16 MED ORDER — ARIPIPRAZOLE 10 MG PO TABS: 5.0000 mg | ORAL_TABLET | Freq: Every day | ORAL | 1 refills | Status: DC

## 2018-06-16 MED ORDER — BUSPIRONE HCL 15 MG PO TABS: 15.0000 mg | ORAL_TABLET | Freq: Two times a day (BID) | ORAL | 1 refills | Status: DC

## 2018-06-16 MED ORDER — GABAPENTIN 400 MG PO CAPS: 800.0000 mg | ORAL_CAPSULE | Freq: Every day | ORAL | 1 refills | Status: DC

## 2018-06-16 MED ORDER — CLONAZEPAM 1 MG PO TABS: 1.0000 mg | ORAL_TABLET | Freq: Every day | ORAL | 1 refills | Status: DC

## 2018-06-16 MED ORDER — VENLAFAXINE HCL ER 150 MG PO CP24: 300.0000 mg | ORAL_CAPSULE | Freq: Every day | ORAL | 1 refills | Status: DC

## 2018-06-16 NOTE — Progress Notes (Signed)
[facility] Check  Patient ID: Bryan Tyler,  MRN: [id number]  PCP: Brunetta Jeans, PA-C  Date of Evaluation: 06/16/2018  Time spent:20 minutes  Chief Complaint:   HISTORY/CURRENT STATUS: HPI patient is a 72 year old white male last seen 04/27/2018.  Having some depression and some anxiety.The medications the same.  Patient was now doing well.  Is having some tremors but did not pick up the ginkgo biloba.  Individual Medical History/ Review of Systems: Changes? :No   Allergies: Codeine and Ciprofloxacin  Current Medications:  Current Outpatient Medications:  .  aspirin 81 MG tablet, Take 81 mg by mouth at bedtime. , Disp: , Rfl:  .  atorvastatin (LIPITOR) 40 MG tablet, TAKE 1 TABLET EVERY EVENING, Disp: 90 tablet, Rfl: 2 .  busPIRone (BUSPAR) 15 MG tablet, Take 1 tablet (15 mg total) by mouth 2 (two) times daily., Disp: 180 tablet, Rfl: 1 .  clonazePAM (KLONOPIN) 1 MG tablet, Take 1 tablet (1 mg total) by mouth at bedtime., Disp: 90 tablet, Rfl: 1 .  gabapentin (NEURONTIN) 400 MG capsule, Take 2 capsules (800 mg total) by mouth at bedtime., Disp: 180 capsule, Rfl: 1 .  levothyroxine (SYNTHROID, LEVOTHROID) 112 MCG tablet, Take 1 tablet (112 mcg total) by mouth daily., Disp: 30 tablet, Rfl: 1 .  losartan (COZAAR) 100 MG tablet, TAKE 1 TABLET(100 MG) BY MOUTH DAILY, Disp: 90 tablet, Rfl: 2 .  Multiple Vitamin (MULTIVITAMIN WITH MINERALS) TABS tablet, Take 1 tablet by mouth daily., Disp: , Rfl:  .  tamsulosin (FLOMAX) 0.4 MG CAPS capsule, Take 1 capsule (0.4 mg total) by mouth daily. (Patient taking differently: Take 0.4 mg by mouth every evening. ), Disp: 30 capsule, Rfl: 3 .  venlafaxine XR (EFFEXOR-XR) 150 MG 24 hr capsule, Take 2 capsules (300 mg total) by mouth daily with breakfast., Disp: 180 capsule, Rfl: 1 .  ARIPiprazole (ABILIFY) 10 MG tablet, Take 0.5 tablets (5 mg total) by mouth daily., Disp: 90 tablet, Rfl: 1 .  omeprazole (PRILOSEC) 40 MG capsule, Take 40 mg  by mouth daily as needed (heartburn)., Disp: , Rfl:  .  oxymetazoline (ANEFRIN NASAL SPRAY) 0.05 % nasal spray, Place 1 spray into both nostrils as needed for congestion. , Disp: , Rfl:  Medication Side Effects: none  Family Medical/ Social History: Changes? no  MENTAL HEALTH EXAM:  There were no vitals taken for this visit.There is no height or weight on file to calculate BMI.  General Appearance: Casual  Eye Contact:  Good  Speech:  Normal Rate  Volume:  Normal  Mood:  Euthymic  Affect:  Appropriate  Thought Process:  Goal Directed  Orientation:  Full (Time, Place, and Person)  Thought Content: Logical   Suicidal Thoughts:  No  Homicidal Thoughts:  No  Memory:  WNL  Judgement:  Good  Insight:  Good  Psychomotor Activity:  Normal  Concentration:  Concentration: Good  Recall:  Good  Fund of Knowledge: Good  Language: Good  Assets:  Social Support  ADL's:  Intact  Cognition: WNL  Prognosis:  Good    DIAGNOSES:    ICD-10-CM   1. Recurrent major depressive disorder, in full remission ([place]) F33.42     Receiving Psychotherapy: No    RECOMMENDATIONS: same meds.  Abilify 2.5 mg daily.  Effexor XR 150 mg 2 a day.  Gabapentin 800 mg daily.  Klonopin 1 mg at bedtime.  BuSpar 15 mg twice daily.  Start ginkgo biloba for his tremor 120 mg twice daily  Comer Locket, PA-C

## 2018-06-21 ENCOUNTER — Ambulatory Visit: Payer: Self-pay | Admitting: Psychiatry

## 2018-06-21 ENCOUNTER — Encounter: Payer: Self-pay | Admitting: Physician Assistant

## 2018-06-21 ENCOUNTER — Other Ambulatory Visit: Payer: Self-pay

## 2018-06-21 ENCOUNTER — Ambulatory Visit (INDEPENDENT_AMBULATORY_CARE_PROVIDER_SITE_OTHER): Payer: Medicare Other | Admitting: Physician Assistant

## 2018-06-21 DIAGNOSIS — I10 Essential (primary) hypertension: Secondary | ICD-10-CM

## 2018-06-21 MED ORDER — OMEPRAZOLE 40 MG PO CPDR: 40.0000 mg | DELAYED_RELEASE_CAPSULE | Freq: Every day | ORAL | 1 refills | Status: DC | PRN

## 2018-06-21 MED ORDER — LEVOTHYROXINE SODIUM 112 MCG PO TABS: 112.0000 ug | ORAL_TABLET | Freq: Every day | ORAL | 1 refills | Status: DC

## 2018-06-21 NOTE — Patient Instructions (Signed)
Please keep well-hydrated.  Your BP looks great today. We will continue your current regimen.  I have sent in 90-day supply with refills of Omeprazole to [facility] for you.  Make sure to pick up the Levothyroxine from your local [facility]. Take daily as directed. Reschedule your lab appointment for 4 weeks from today.  Follow-up with specialists as scheduled.  Take care!

## 2018-06-21 NOTE — Progress Notes (Signed)
Patient presents to clinic today for 2 week follow-up of hypertension. At last visit Spironolactone was discontinued due to causing gingival hyperplasia. At that time because BP was on lower end of normal without him having the Spironolactone for a couple of days, we opted to not start any additional agents. He has continued Losartan as directed. Patient denies chest pain, palpitations, lightheadedness, dizziness, vision changes or frequent headaches.  BP Readings from Last 3 Encounters:  06/21/18 118/78  06/07/18 104/70  05/27/18 120/78   Patient requesting refill of his Omeprazole to [facility].   Past Medical History:  Diagnosis Date  . Adenocarcinoma of prostate ([place]) 04/16/2013  . Aneurysm, aorta, thoracic ([place])   . Anxiety   . Arthritis    "knees" (05/21/2017)  . Depression   . Diverticulosis   . Dyspnea    on exertion for a long period time  . GERD (gastroesophageal reflux disease)   . Hyperlipidemia   . Hypertension   . Hypothyroidism, postradioiodine therapy    1980's  . Nocturia   . Organic impotence   . OSA (obstructive sleep apnea)    NO CPAP--  S/P SURGERY  2002  . Osteoporosis   . Pneumonia 1970  . PONV (postoperative nausea and vomiting)   . Urge urinary incontinence   . Wears contact lenses     Current Outpatient Medications on File Prior to Visit  Medication Sig Dispense Refill  . ARIPiprazole (ABILIFY) 10 MG tablet Take 0.5 tablets (5 mg total) by mouth daily. 90 tablet 1  . aspirin 81 MG tablet Take 81 mg by mouth at bedtime.     Marland Kitchen atorvastatin (LIPITOR) 40 MG tablet TAKE 1 TABLET EVERY EVENING 90 tablet 2  . busPIRone (BUSPAR) 15 MG tablet Take 1 tablet (15 mg total) by mouth 2 (two) times daily. 180 tablet 1  . clonazePAM (KLONOPIN) 1 MG tablet Take 1 tablet (1 mg total) by mouth at bedtime. 90 tablet 1  . gabapentin (NEURONTIN) 400 MG capsule Take 2 capsules (800 mg total) by mouth at bedtime. 180 capsule 1  . losartan (COZAAR) 100 MG tablet TAKE 1  TABLET(100 MG) BY MOUTH DAILY 90 tablet 2  . Multiple Vitamin (MULTIVITAMIN WITH MINERALS) TABS tablet Take 1 tablet by mouth daily.    Marland Kitchen oxymetazoline (ANEFRIN NASAL SPRAY) 0.05 % nasal spray Place 1 spray into both nostrils as needed for congestion.     . tamsulosin (FLOMAX) 0.4 MG CAPS capsule Take 1 capsule (0.4 mg total) by mouth daily. (Patient taking differently: Take 0.4 mg by mouth every evening. ) 30 capsule 3  . venlafaxine XR (EFFEXOR-XR) 150 MG 24 hr capsule Take 2 capsules (300 mg total) by mouth daily with breakfast. 180 capsule 1   No current facility-administered medications on file prior to visit.     Allergies  Allergen Reactions  . Codeine Nausea And Vomiting and Other (See Comments)    DIZZINESS  . Ciprofloxacin     Family History  Problem Relation Age of Onset  . Cancer Mother        Pancreatic Cancer  . Heart disease Father 3       CABG    Social History   Socioeconomic History  . Marital status: Married    Spouse name: Not on file  . Number of children: Not on file  . Years of education: Not on file  . Highest education level: Not on file  Occupational History  . Occupation: Patent examiner  Social  Needs  . Financial resource strain: Not on file  . Food insecurity:    Worry: Not on file    Inability: Not on file  . Transportation needs:    Medical: Not on file    Non-medical: Not on file  Tobacco Use  . Smoking status: Former Smoker    Packs/day: 1.00    Years: 20.00    Pack years: 20.00    Types: Cigarettes    Last attempt to quit: 08/11/1988    Years since quitting: 29.8  . Smokeless tobacco: Never Used  Substance and Sexual Activity  . Alcohol use: Yes    Comment: 05/21/2017 "1-2 beers/month"  . Drug use: No  . Sexual activity: Not Currently  Lifestyle  . Physical activity:    Days per week: Not on file    Minutes per session: Not on file  . Stress: Not on file  Relationships  . Social connections:    Talks on phone: Not  on file    Gets together: Not on file    Attends religious service: Not on file    Active member of club or organization: Not on file    Attends meetings of clubs or organizations: Not on file    Relationship status: Not on file  Other Topics Concern  . Not on file  Social History Narrative  . Not on file    Review of Systems - See HPI.  All other ROS are negative.  BP 118/78   Pulse 93   Temp 97.7 F (36.5 C) (Oral)   Resp 14   Ht 5\' 8"  (1.727 m)   Wt 194 lb (88 kg)   SpO2 98%   BMI 29.50 kg/m   Physical Exam  Constitutional: He is oriented to person, place, and time. He appears well-developed and well-nourished.  HENT:  Head: Normocephalic and atraumatic.  Eyes: Conjunctivae are normal.  Neck: Neck supple.  Cardiovascular: Normal rate, regular rhythm, normal heart sounds and intact distal pulses.  Pulmonary/Chest: Effort normal and breath sounds normal. No stridor. No respiratory distress. He has no wheezes. He has no rales. He exhibits no tenderness.  Neurological: He is alert and oriented to person, place, and time.  Vitals reviewed.   Recent Results (from the past 2160 hour(s))  Anti-scleroderma antibody     Status: None   Collection Time: 04/29/18 10:27 AM  Result Value Ref Range   Scleroderma (Scl-70) (ENA) Antibody, IgG <1.0 NEG <8.9 NEG AI  Cyclic citrul peptide antibody, IgG     Status: Abnormal   Collection Time: 04/29/18 10:27 AM  Result Value Ref Range   Cyclic Citrullin Peptide Ab 47 (H) UNITS    Comment: Reference Range Negative:            <20 Weak Positive:       20-39 Moderate Positive:   40-59 Strong Positive:     >59 .   Angiotensin converting enzyme     Status: None   Collection Time: 04/29/18 10:27 AM  Result Value Ref Range   Angiotensin-Converting Enzyme 24 9 - 67 U/L  Sjogren's syndrome antibods(ssa + ssb)     Status: None   Collection Time: 04/29/18 10:27 AM  Result Value Ref Range   SSA (Ro) (ENA) Antibody, IgG <1.0 NEG <1.0 NEG  AI   SSB (La) (ENA) Antibody, IgG <1.0 NEG <1.0 NEG AI  RNP Antibodies     Status: None   Collection Time: 04/29/18 10:27 AM  Result Value Ref Range  ENA RNP Ab <0.2 0.0 - 0.9 AI  Rheumatoid factor     Status: None   Collection Time: 04/29/18 10:27 AM  Result Value Ref Range   Rhuematoid fact SerPl-aCnc <14 <14 IU/mL  Mpo/pr-3 (anca) antibodies     Status: None   Collection Time: 04/29/18 10:27 AM  Result Value Ref Range   Myeloperoxidase Abs <1.0 AI    Comment:      Value        Interpretation      -----        --------------      <1.0         No Antibody Detected      > or = 1.0   Antibody Detected . Autoantibodies to myeloperoxidase (MPO) are commonly associated with the following small-vessel vasculitides: microscopic polyangiitis, polyarteritis nodosa, Churg-Strauss syndrome, necrotizing and crescentic glomerulonephritis and occasionally granulomatosis with polyangiitis (GPA, Wegener's). The perinuclear IFA pattern, (p-ANCA) is based largely on autoantibody to  myeloperoxidase which serves as the primary antigen. These autoantibodies are present in active disease. .    Serine Protease 3 <1.0 AI    Comment:      Value        Interpretation      -----        --------------      <1.0         No Antibody Detected      > or = 1.0   Antibody Detected . Autoantibodies to proteinase-3 (PR-3) are accepted as characteristic for granulomatosis with polyangiitis (GPA, Wegener's), and are detectable in 95% of the histologically proven cases. The cytoplasmic IFA pattern, (c-ANCA), is based largely on autoantibody to PR-3 which serves as the primary antigen. These autoantibodies are present in active disease. Marland Kitchen   Hypersensitivity pnuemonitis profile     Status: None   Collection Time: 04/29/18 10:27 AM  Result Value Ref Range   ASPERGILLUS FUMIGATUS NEGATIVE NEGATIVE   Fa[place] retivirgula NEGATIVE NEGATIVE   [place] Serum NEGATIVE NEGATIVE   T. CANDIDUS NEGATIVE NEGATIVE    T. VULGARIS NEGATIVE NEGATIVE   S. VIRIDIS NEGATIVE NEGATIVE    Comment: . This test was developed and its analytical performance characteristics have been determined by [facility]. It has not been cleared or approved by FDA. This assay has been validated pursuant to the CLIA regulations and is used for clinical purposes.   ANCA screen with reflex titer     Status: None   Collection Time: 04/29/18 10:27 AM  Result Value Ref Range   ANCA Screen NEGATIVE NEGATIVE    Comment: ANCA Screen includes evaluation for p-ANCA, c-ANCA and atypical p-ANCA. A positive ANCA screen reflexes to titer and pattern(s), e.g., cytoplasmic pattern (c-ANCA), perinuclear pattern (p-ANCA), or atypical p-ANCA pattern.  c-ANCA and p-ANCA are observed in vasculitis, whereas atypical p-ANCA is observed in IBD (Inflammatory Bowel Disease). Atypical p-ANCA is  detected in about 55% to 80% of patients with ulcerative colitis but only 5% to 25% of patients with Crohn's disease. .   Aldolase     Status: None   Collection Time: 04/29/18 10:27 AM  Result Value Ref Range   Aldolase CANCELED     Comment: Test Not Performed. A single specimen was submitted and cannot be shared for multiple tests.  Result canceled by the ancillary.   CK total and CKMB (cardiac)not at [facility]     Status: None   Collection Time: 04/29/18 10:27 AM  Result Value  Ref Range   Total CK 111 44 - 196 U/L   CK, MB 2.9 0 - 5.0 ng/mL   Relative Index 2.6 0 - 4.0  Anti-DNA antibody, double-stranded     Status: None   Collection Time: 04/29/18 10:27 AM  Result Value Ref Range   ds DNA Ab <1 IU/mL    Comment:                            IU/mL       Interpretation                            < or = 4    Negative                            5-9         Indeterminate                            > or = 10   Positive .   ANA     Status: None   Collection Time: 04/29/18 10:27 AM  Result Value Ref Range    Anti Nuclear Antibody(ANA) NEGATIVE NEGATIVE    Comment: ANA IFA is a first line screen for detecting the presence of up to approximately 150 autoantibodies in various autoimmune diseases. A negative ANA IFA result suggests an ANA-associated autoimmune disease is not present at this time, but is not definitive. If there is high clinical suspicion for Sjogren's syndrome, testing for anti-SS-A/Ro antibody should be considered. Anti-Jo-1 antibody should be considered for clinically suspected inflammatory myopathies. . AC-0: Negative . International Consensus on ANA Patterns (https://www.hernandez-brewer.com/) . For additional information, please refer to http://education.[facility].com/faq/FAQ177 (This link is being provided for informational/ educational purposes only.) .   Sedimentation rate     Status: Abnormal   Collection Time: 04/29/18 10:27 AM  Result Value Ref Range   Sed Rate 23 (H) 0 - 20 mm/hr  PTH, intact and calcium     Status: None   Collection Time: 04/29/18  1:55 PM  Result Value Ref Range   PTH 52 14 - 64 pg/mL    Comment: . Interpretive Guide    Intact PTH           Calcium ------------------    ----------           ------- Normal Parathyroid    Normal               Normal Hypoparathyroidism    Low or Low Normal    Low Hyperparathyroidism    Primary            Normal or High       High    Secondary          High                 Normal or Low    Tertiary           High                 High Non-Parathyroid    Hypercalcemia      Low or Low Normal    High .    Calcium 9.4 8.6 - 10.3 mg/dL  VITAMIN D 25 Hydroxy (Vit-D Deficiency, Fractures)     Status:  None   Collection Time: 04/29/18  1:55 PM  Result Value Ref Range   VITD 33.20 30.00 - 100.00 ng/mL  Urinalysis, Routine w reflex microscopic     Status: None   Collection Time: 04/29/18  1:55 PM  Result Value Ref Range   Color, Urine YELLOW Yellow;Lt. Yellow   APPearance CLEAR Clear    Specific Gravity, Urine 1.025 1.000 - 1.030   pH 6.0 5.0 - 8.0   Total Protein, Urine NEGATIVE Negative   Urine Glucose NEGATIVE Negative   Ketones, ur NEGATIVE Negative   Bilirubin Urine NEGATIVE Negative   Hgb urine dipstick NEGATIVE Negative   Urobilinogen, UA 0.2 0.0 - 1.0   Leukocytes, UA NEGATIVE Negative   Nitrite NEGATIVE Negative   WBC, UA 0-2/hpf 0-2/hpf   RBC / HPF none seen 0-2/hpf  Hepatic function panel     Status: None   Collection Time: 04/29/18  1:55 PM  Result Value Ref Range   Total Bilirubin 0.6 0.2 - 1.2 mg/dL   Bilirubin, Direct 0.1 0.0 - 0.3 mg/dL   Alkaline Phosphatase 53 39 - 117 U/L   AST 17 0 - 37 U/L   ALT 16 0 - 53 U/L   Total Protein 6.9 6.0 - 8.3 g/dL   Albumin 4.2 3.5 - 5.2 g/dL  CBC with Differential/Platelet     Status: None   Collection Time: 04/29/18  1:55 PM  Result Value Ref Range   WBC 7.1 4.0 - 10.5 K/uL   RBC 4.47 4.22 - 5.81 Mil/uL   Hemoglobin 13.7 13.0 - 17.0 g/dL   HCT 41.2 39.0 - 52.0 %   MCV 92.1 78.0 - 100.0 fl   MCHC 33.3 30.0 - 36.0 g/dL   RDW 14.1 11.5 - 15.5 %   Platelets 238.0 150.0 - 400.0 K/uL   Neutrophils Relative % 54.7 43.0 - 77.0 %   Lymphocytes Relative 31.0 12.0 - 46.0 %   Monocytes Relative 11.2 3.0 - 12.0 %   Eosinophils Relative 2.3 0.0 - 5.0 %   Basophils Relative 0.8 0.0 - 3.0 %   Neutro Abs 3.9 1.4 - 7.7 K/uL   Lymphs Abs 2.2 0.7 - 4.0 K/uL   Monocytes Absolute 0.8 0.1 - 1.0 K/uL   Eosinophils Absolute 0.2 0.0 - 0.7 K/uL   Basophils Absolute 0.1 0.0 - 0.1 K/uL  Lipid panel     Status: Abnormal   Collection Time: 04/29/18  1:55 PM  Result Value Ref Range   Cholesterol 140 0 - 200 mg/dL    Comment: ATP III Classification       Desirable:  < 200 mg/dL               Borderline High:  200 - 239 mg/dL          High:  > = 240 mg/dL   Triglycerides 211.0 (H) 0.0 - 149.0 mg/dL    Comment: Normal:  <150 mg/dLBorderline High:  150 - 199 mg/dL   HDL 49.10 >39.00 mg/dL   VLDL 42.2 (H) 0.0 - 40.0 mg/dL   Total  CHOL/HDL Ratio 3     Comment:                Men          Women1/2 Average Risk     3.4          3.3Average Risk          5.0          4.42X  Average Risk          9.6          7.13X Average Risk          15.0          11.0                       NonHDL 91.26     Comment: NOTE:  Non-HDL goal should be 30 mg/dL higher than patient's LDL goal (i.e. LDL goal of < 70 mg/dL, would have non-HDL goal of < 100 mg/dL)  Basic metabolic panel     Status: None   Collection Time: 04/29/18  1:55 PM  Result Value Ref Range   Sodium 139 135 - 145 mEq/L   Potassium 4.4 3.5 - 5.1 mEq/L   Chloride 103 96 - 112 mEq/L   CO2 29 19 - 32 mEq/L   Glucose, Bld 95 70 - 99 mg/dL   BUN 17 6 - 23 mg/dL   Creatinine, Ser 0.90 0.40 - 1.50 mg/dL   Calcium 9.3 8.4 - 10.5 mg/dL   GFR 87.77 >60.00 mL/min  TSH     Status: Abnormal   Collection Time: 04/29/18  1:55 PM  Result Value Ref Range   TSH 0.15 (L) 0.35 - 4.50 uIU/mL  LDL cholesterol, direct     Status: None   Collection Time: 04/29/18  1:55 PM  Result Value Ref Range   Direct LDL 74.0 mg/dL    Comment: Optimal:  <100 mg/dLNear or Above Optimal:  100-129 mg/dLBorderline High:  130-159 mg/dLHigh:  160-189 mg/dLVery High:  >190 mg/dL  Aldolase     Status: None   Collection Time: 05/03/18  1:00 PM  Result Value Ref Range   Aldolase 5.0 < OR = 8.1 U/L  Pulmonary function test     Status: None (Preliminary result)   Collection Time: 05/27/18  8:32 AM  Result Value Ref Range   FVC-Pre 3.12 L   FVC-%Pred-Pre 78 %   FVC-Post 3.08 L   FVC-%Pred-Post 77 %   FVC-%Change-Post -1 %   FEV1-Pre 2.80 L   FEV1-%Pred-Pre 96 %   FEV1-Post 2.81 L   FEV1-%Pred-Post 97 %   FEV1-%Change-Post 0 %   FEV6-Pre 3.12 L   FEV6-%Pred-Pre 83 %   FEV6-Post 3.08 L   FEV6-%Pred-Post 82 %   FEV6-%Change-Post -1 %   Pre FEV1/FVC ratio 90 %   FEV1FVC-%Pred-Pre 123 %   Post FEV1/FVC ratio 91 %   FEV1FVC-%Change-Post 1 %   Pre FEV6/FVC Ratio 100 %   FEV6FVC-%Pred-Pre 106 %   Post  FEV6/FVC ratio 100 %   FEV6FVC-%Pred-Post 106 %   FEV6FVC-%Change-Post 0 %   FEF 25-75 Pre 4.97 L/sec   FEF2575-%Pred-Pre 232 %   FEF 25-75 Post 5.09 L/sec   FEF2575-%Pred-Post 237 %   FEF2575-%Change-Post 2 %  TSH     Status: Abnormal   Collection Time: 06/07/18  2:24 PM  Result Value Ref Range   TSH 0.28 (L) 0.35 - 4.50 uIU/mL    Assessment/Plan: HTN (hypertension) BP stable today. Asymptomatic. Will continue with losartan as monotherapy. Continue DASH diet and good hydration. Follow-up scheduled.     Leeanne Rio, PA-C

## 2018-06-21 NOTE — Assessment & Plan Note (Signed)
BP stable today. Asymptomatic. Will continue with losartan as monotherapy. Continue DASH diet and good hydration. Follow-up scheduled.

## 2018-06-22 ENCOUNTER — Telehealth: Payer: Self-pay | Admitting: Internal Medicine

## 2018-06-22 NOTE — Telephone Encounter (Signed)
I refaxed forms to [facility], called [facility] but was on hold for over 7 mins. Will try to call again to confirm they received the forms.

## 2018-06-23 ENCOUNTER — Telehealth: Payer: Self-pay

## 2018-06-23 NOTE — Telephone Encounter (Signed)
[facility] Phone [phone] Fax [phone]  Called and spoke with Sula Soda at [place], [place] initiated for [facility], claim denied. Per Sula Soda the preferred drug is Esbriet which the patient has not tried and failed. Case ID [id number]. Per Sula Soda we will need a letter of medical necessity, a medical consent form from the patient, and any supporting documentation. Appeal Fax [phone].  Turn around time for the appeal is 20-30 days and can take an upwards of 90 days.   MR please advise if you would like to initiate appeal or would like to try the patient on Esbriet instead?

## 2018-06-24 NOTE — Telephone Encounter (Signed)
Please refer to open encounter from 49/70/26 as it is a duplicate encounter to this one. This encounter will be closed.

## 2018-06-24 NOTE — Telephone Encounter (Signed)
LEt Bryan Tyler know that his insurance wants him to try esbriet first. And this is fine with me bcause is as good as ofev in this situation. Only things are   - slow titration up  - will end up with 3 pill tid - take with meals -  5h apart between tablets - less diarrhea but more chance for nausea - compared to ofev - some fatigue and weight loss - similar to ofev - has to wear sunscreen - and just like ofev needs lft check  If he is ok with it please start - please get [place] or an APP to start/sign paper work IF he is not ok with it, I can see him as scheduled on 07/13/18 to discuss then

## 2018-06-24 NOTE — Telephone Encounter (Signed)
Paperwork received, OFEV PA has been denied. Message to MR to see if he would like to appeal or try Esbriet per the appeal recommendations. Paperwork placed in MR hold/look at folder. Will await recommendations from MR.

## 2018-06-25 NOTE — Telephone Encounter (Signed)
Called and spoke with pt letting him know per his [employer], pt needed to do Esbriet before doing OFEV. I asked pt if he was fine going ahead and getting paperwork taken care of or if he wanted to come to office to discuss this with MR first.  Pt stated he preferred to discuss this in person with MR. I did move pt's appt up to 07/07/18 so pt can discuss this with MR and then we could get the paperwork started for the [place]. Nothing further needed.

## 2018-06-28 ENCOUNTER — Telehealth: Payer: Self-pay | Admitting: Internal Medicine

## 2018-06-28 NOTE — Telephone Encounter (Signed)
Called and spoke to [employer] at [employer], informed her we were waiting to hear back from MR to decide whether he wanted to appeal the ofev or start esbriet which is the patient's insurance preferred drug. Voiced understanding. Nothing further is needed at this time.

## 2018-07-06 ENCOUNTER — Telehealth: Payer: Self-pay | Admitting: Internal Medicine

## 2018-07-06 NOTE — Telephone Encounter (Signed)
ATC Cory but was unable to leave a message at this time.

## 2018-07-07 ENCOUNTER — Ambulatory Visit: Payer: Medicare Other | Admitting: Internal Medicine

## 2018-07-07 NOTE — Telephone Encounter (Signed)
I was fine with him starting esbriet. See muy 06/23/18 phone note. I never got a phone message after that. So, what answer are you waiting for ?

## 2018-07-07 NOTE — Telephone Encounter (Signed)
Patient has an appointment today. This will be addressed at office visit. Nothing further is needed at this time.

## 2018-07-07 NOTE — Telephone Encounter (Signed)
Called and spoke with Bryan Tyler, she stated that they are still waiting on the appeal process for the patients medication. I advised Bryan Tyler that we were still waiting on the doctors answer.   MR please advise, see phone noted dated 06/28/18. Thank you.

## 2018-07-12 ENCOUNTER — Telehealth: Payer: Self-pay | Admitting: Internal Medicine

## 2018-07-12 NOTE — Telephone Encounter (Signed)
Pt has to try Esbriet first due to his insurance being [employer] before he can be able to be approved for OFEV. This was addressed in another phone note.  Pt wanted to wait to discuss this with MR before we did anything in regards to getting paperwork started for the Esbriet.  Pt did have an OV 07/07/18 and the paperwork for the Esbriet was to be handled at that [place]. But, no pt is not going to start on OFEV at this time due to his insurance.  Called [facility] and spoke with [facility], pharmacist letting her know the above information. Holly expressed understanding and stated she would discontinue the OFEV start and would let [facility] know. Nothing further needed.

## 2018-07-12 NOTE — Telephone Encounter (Signed)
Raquel Sarna have you received any correspondence regarding OFEV. I remember giving you the paperwork because I worked with MR that day. Please advise.

## 2018-07-13 ENCOUNTER — Ambulatory Visit: Payer: Medicare Other | Admitting: Internal Medicine

## 2018-07-13 ENCOUNTER — Other Ambulatory Visit: Payer: Self-pay | Admitting: Emergency Medicine

## 2018-07-13 MED ORDER — LEVOTHYROXINE SODIUM 112 MCG PO TABS: 112.0000 ug | ORAL_TABLET | Freq: Every day | ORAL | 1 refills | Status: DC

## 2018-07-15 ENCOUNTER — Telehealth: Payer: Self-pay | Admitting: Internal Medicine

## 2018-07-15 NOTE — Telephone Encounter (Signed)
Pt was scheduled to see MR on 11.27.19 >> appt was for follow up after patient was to begin [place] Per patient's chart, pt is not going to take OFEV but instead needs to start Esbriet d/t insurance  While patient was here in the office on 11.27.19, patient was able to fill out and sign the [employer] with Raquel Sarna about the above  Forms personally handed to [place] and message routed to her as well.

## 2018-07-19 NOTE — Telephone Encounter (Signed)
Pt's paperwork was faxed 07/16/18 after I received it from [place]. Still awaiting approval. Will update when I have more information.

## 2018-07-20 DIAGNOSIS — M1711 Unilateral primary osteoarthritis, right knee: Secondary | ICD-10-CM | POA: Diagnosis not present

## 2018-07-20 NOTE — Telephone Encounter (Addendum)
Received a fax from [facility] that stated due to pt's insurance, pt must obtain Esbriet via [employer], community.  [place] and spoke with Margreta Journey to see if the application was sent directly to [employer] from [place] but I was told by the representative stated that the Rx was not sent to them from [place].  Transferred to pharmacist line and spoke with Jocelyn Lamer at [facility]. While speaking with Jocelyn Lamer, I gave a verbal for two prescriptions. One was for the initial titration with instructions: days 1-7 take one capsule by mouth three times daily with meals, days 8-14 take two capsules by mouth three times daily with meals, days 15 on take three capsules by mouth three times daily.  Also gave a second Rx for the maintenance dose with instructions take three capsules by mouth three times daily with meals.   Attempted to call pt to relay the information I found out from [place] in regards to his insurance and what we had to do but unable to reach him. Left message for pt to return call.

## 2018-07-21 NOTE — Telephone Encounter (Signed)
Attempted to call the number listed but when I called, I immediately got a tone that sounded like a fax machine.  Called pt's mobile number listed but unable to reach pt. Left message for pt to return call.

## 2018-07-21 NOTE — Telephone Encounter (Signed)
Pt is calling back [phone]

## 2018-07-23 ENCOUNTER — Telehealth: Payer: Self-pay | Admitting: *Deleted

## 2018-07-23 NOTE — Telephone Encounter (Signed)
   [facility] Pre-operative Risk Assessment    Request for surgical clearance:  1. What type of surgery is being performed? Right TKA   2. When is this surgery scheduled?  Not scheduled   3. What type of clearance is required (medical clearance vs. Pharmacy clearance to hold med vs. Both)? Medical  4. Are there any medications that need to be held prior to surgery and how long? none noted.    5. Practice name and name of physician performing surgery? [name]. Dr. Theda Sers   6. What is your office phone [phone]    7.   What is your office fax number [phone] Attn:Laurie Oran Rein  8.   Anesthesia type (None, local, MAC, general) ? Spinal with adductor canal    _________________________________________________________________   (provider comments below)

## 2018-07-27 NOTE — Telephone Encounter (Signed)
Received a fax from [employer] stating that they have approved pt's Esbriet medication. Nothing further needed.

## 2018-07-28 ENCOUNTER — Other Ambulatory Visit: Payer: Self-pay

## 2018-07-28 ENCOUNTER — Encounter: Payer: Self-pay | Admitting: Physician Assistant

## 2018-07-28 ENCOUNTER — Other Ambulatory Visit: Payer: Medicare Other

## 2018-07-28 ENCOUNTER — Ambulatory Visit (INDEPENDENT_AMBULATORY_CARE_PROVIDER_SITE_OTHER): Payer: Medicare Other | Admitting: Physician Assistant

## 2018-07-28 VITALS — BP 118/72 | HR 94 | Temp 97.5°F | Resp 16 | Ht 68.0 in | Wt 196.4 lb

## 2018-07-28 DIAGNOSIS — I10 Essential (primary) hypertension: Secondary | ICD-10-CM | POA: Diagnosis not present

## 2018-07-28 DIAGNOSIS — Z01818 Encounter for other preprocedural examination: Secondary | ICD-10-CM

## 2018-07-28 DIAGNOSIS — E018 Other iodine-deficiency related thyroid disorders and allied conditions: Secondary | ICD-10-CM

## 2018-07-28 LAB — COMPREHENSIVE METABOLIC PANEL
ALT: 20 U/L (ref 0–53)
AST: 19 U/L (ref 0–37)
Albumin: 4.4 g/dL (ref 3.5–5.2)
Alkaline Phosphatase: 61 U/L (ref 39–117)
BUN: 18 mg/dL (ref 6–23)
CO2: 26 mEq/L (ref 19–32)
Calcium: 9.6 mg/dL (ref 8.4–10.5)
Chloride: 102 mEq/L (ref 96–112)
Creatinine, Ser: 0.99 mg/dL (ref 0.40–1.50)
GFR: 78.58 mL/min (ref >60.00)
Glucose, Bld: 105 mg/dL — ABNORMAL HIGH (ref 70–99)
Potassium: 4.5 mEq/L (ref 3.5–5.1)
Sodium: 137 mEq/L (ref 135–145)
Total Bilirubin: 0.7 mg/dL (ref 0.2–1.2)
Total Protein: 6.9 g/dL (ref 6.0–8.3)

## 2018-07-28 LAB — CBC WITH DIFFERENTIAL/PLATELET
BASOS ABS: 0.1 10*3/uL (ref 0.0–0.1)
Basophils Relative: 0.7 % (ref 0.0–3.0)
Eosinophils Absolute: 0.2 10*3/uL (ref 0.0–0.7)
Eosinophils Relative: 3 % (ref 0.0–5.0)
HCT: 44.3 % (ref 39.0–52.0)
Hemoglobin: 14.9 g/dL (ref 13.0–17.0)
LYMPHS PCT: 27 % (ref 12.0–46.0)
Lymphs Abs: 2.2 10*3/uL (ref 0.7–4.0)
MCHC: 33.6 g/dL (ref 30.0–36.0)
MCV: 93 fl (ref 78.0–100.0)
Monocytes Absolute: 0.8 10*3/uL (ref 0.1–1.0)
Monocytes Relative: 9.4 % (ref 3.0–12.0)
NEUTROS ABS: 4.9 10*3/uL (ref 1.4–7.7)
Neutrophils Relative %: 59.9 % (ref 43.0–77.0)
Platelets: 251 10*3/uL (ref 150.0–400.0)
RBC: 4.76 Mil/uL (ref 4.22–5.81)
RDW: 13.7 % (ref 11.5–15.5)
WBC: 8.1 10*3/uL (ref 4.0–10.5)

## 2018-07-28 LAB — TSH: TSH: 1.45 u[IU]/mL (ref 0.35–4.50)

## 2018-07-28 NOTE — Telephone Encounter (Signed)
   [place], MD  Chart reviewed as part of pre-operative protocol coverage. Because of CHADRICK SPRINKLE past medical history and time since last visit, he/she will require a follow-up visit in order to better assess preoperative cardiovascular risk.  Coming up on need for 1 year follow-up. ETT earlier this year stated, "Given poor overall exercise effort, consider further cardiac testing if symptoms warrant." Therefore I feel OV is safest means to clear.  Pre-op covering staff: - Please schedule appointment and call patient to inform them. - Please contact requesting surgeon's office via preferred method (i.e, phone, fax) to inform them of need for appointment prior to surgery.  Charlie Pitter, PA-C  07/28/2018, 5:01 PM

## 2018-07-28 NOTE — Progress Notes (Signed)
Patient presents to clinic today to follow-up regarding hypertension and hypothyroidism. Patient is also needing a preoperative clearance.   Hypertension -- Patient currently on a regimen of losartan 100 mg. Endorses taking as directed. Patient denies chest pain, palpitations, lightheadedness, dizziness, vision changes or frequent headaches.  BP Readings from Last 3 Encounters:  07/28/18 118/72  06/21/18 118/78  06/07/18 104/70   Hypothyroidism -- At last visit, TSH noted to be decreased. As such, dose of levothyroxine was lowered to 112 mcg daily. Endorses taking daily as directed. Is feeling well overall. Due for repeat TSH which will be drawn today.  Patient is scheduled for an upcoming TKR with Dr. Landis Gandy. Date is TBD.Marland Kitchen   Past Medical History:  Diagnosis Date  . Adenocarcinoma of prostate ([place]) 04/16/2013  . Aneurysm, aorta, thoracic ([place])   . Anxiety   . Arthritis    "knees" (05/21/2017)  . Depression   . Diverticulosis   . Dyspnea    on exertion for a long period time  . GERD (gastroesophageal reflux disease)   . Hyperlipidemia   . Hypertension   . Hypothyroidism, postradioiodine therapy    1980's  . Nocturia   . Organic impotence   . OSA (obstructive sleep apnea)    NO CPAP--  S/P SURGERY  2002  . Osteoporosis   . Pneumonia 1970  . PONV (postoperative nausea and vomiting)   . Urge urinary incontinence   . Wears contact lenses     Current Outpatient Medications on File Prior to Visit  Medication Sig Dispense Refill  . ARIPiprazole (ABILIFY) 10 MG tablet Take 0.5 tablets (5 mg total) by mouth daily. 90 tablet 1  . aspirin 81 MG tablet Take 81 mg by mouth at bedtime.     Marland Kitchen atorvastatin (LIPITOR) 40 MG tablet TAKE 1 TABLET EVERY EVENING 90 tablet 2  . busPIRone (BUSPAR) 15 MG tablet Take 1 tablet (15 mg total) by mouth 2 (two) times daily. 180 tablet 1  . clonazePAM (KLONOPIN) 1 MG tablet Take 1 tablet (1 mg total) by mouth at bedtime. 90 tablet 1  . gabapentin  (NEURONTIN) 400 MG capsule Take 2 capsules (800 mg total) by mouth at bedtime. 180 capsule 1  . levothyroxine (SYNTHROID, LEVOTHROID) 112 MCG tablet Take 1 tablet (112 mcg total) by mouth daily. 90 tablet 1  . losartan (COZAAR) 100 MG tablet TAKE 1 TABLET(100 MG) BY MOUTH DAILY 90 tablet 2  . Multiple Vitamin (MULTIVITAMIN WITH MINERALS) TABS tablet Take 1 tablet by mouth daily.    Marland Kitchen omeprazole (PRILOSEC) 40 MG capsule Take 1 capsule (40 mg total) by mouth daily as needed (heartburn). 90 capsule 1  . oxymetazoline (ANEFRIN NASAL SPRAY) 0.05 % nasal spray Place 1 spray into both nostrils as needed for congestion.     . tamsulosin (FLOMAX) 0.4 MG CAPS capsule Take 1 capsule (0.4 mg total) by mouth daily. (Patient taking differently: Take 0.4 mg by mouth every evening. ) 30 capsule 3  . venlafaxine XR (EFFEXOR-XR) 150 MG 24 hr capsule Take 2 capsules (300 mg total) by mouth daily with breakfast. 180 capsule 1   No current facility-administered medications on file prior to visit.     Allergies  Allergen Reactions  . Codeine Nausea And Vomiting and Other (See Comments)    DIZZINESS  . Ciprofloxacin     Family History  Problem Relation Age of Onset  . Cancer Mother        Pancreatic Cancer  . Heart disease Father  89       CABG    Social History   Socioeconomic History  . Marital status: Married    Spouse name: Not on file  . Number of children: Not on file  . Years of education: Not on file  . Highest education level: Not on file  Occupational History  . Occupation: Patent examiner  Social Needs  . Financial resource strain: Not on file  . Food insecurity:    Worry: Not on file    Inability: Not on file  . Transportation needs:    Medical: Not on file    Non-medical: Not on file  Tobacco Use  . Smoking status: Former Smoker    Packs/day: 1.00    Years: 20.00    Pack years: 20.00    Types: Cigarettes    Last attempt to quit: 08/11/1988    Years since quitting: 29.9    . Smokeless tobacco: Never Used  Substance and Sexual Activity  . Alcohol use: Yes    Comment: 05/21/2017 "1-2 beers/month"  . Drug use: No  . Sexual activity: Not Currently  Lifestyle  . Physical activity:    Days per week: Not on file    Minutes per session: Not on file  . Stress: Not on file  Relationships  . Social connections:    Talks on phone: Not on file    Gets together: Not on file    Attends religious service: Not on file    Active member of club or organization: Not on file    Attends meetings of clubs or organizations: Not on file    Relationship status: Not on file  Other Topics Concern  . Not on file  Social History Narrative  . Not on file   Review of Systems - See HPI.  All other ROS are negative.  BP 118/72   Pulse 94   Temp (!) 97.5 F (36.4 C) (Oral)   Resp 16   Ht 5\' 8"  (1.727 m)   Wt 196 lb 6.4 oz (89.1 kg)   SpO2 98%   BMI 29.86 kg/m   Physical Exam  Recent Results (from the past 2160 hour(s))  PTH, intact and calcium     Status: None   Collection Time: 04/29/18  1:55 PM  Result Value Ref Range   PTH 52 14 - 64 pg/mL    Comment: . Interpretive Guide    Intact PTH           Calcium ------------------    ----------           ------- Normal Parathyroid    Normal               Normal Hypoparathyroidism    Low or Low Normal    Low Hyperparathyroidism    Primary            Normal or High       High    Secondary          High                 Normal or Low    Tertiary           High                 High Non-Parathyroid    Hypercalcemia      Low or Low Normal    High .    Calcium 9.4 8.6 - 10.3 mg/dL  VITAMIN D  25 Hydroxy (Vit-D Deficiency, Fractures)     Status: None   Collection Time: 04/29/18  1:55 PM  Result Value Ref Range   VITD 33.20 30.00 - 100.00 ng/mL  Urinalysis, Routine w reflex microscopic     Status: None   Collection Time: 04/29/18  1:55 PM  Result Value Ref Range   Color, Urine YELLOW Yellow;Lt. Yellow   APPearance  CLEAR Clear   Specific Gravity, Urine 1.025 1.000 - 1.030   pH 6.0 5.0 - 8.0   Total Protein, Urine NEGATIVE Negative   Urine Glucose NEGATIVE Negative   Ketones, ur NEGATIVE Negative   Bilirubin Urine NEGATIVE Negative   Hgb urine dipstick NEGATIVE Negative   Urobilinogen, UA 0.2 0.0 - 1.0   Leukocytes, UA NEGATIVE Negative   Nitrite NEGATIVE Negative   WBC, UA 0-2/hpf 0-2/hpf   RBC / HPF none seen 0-2/hpf  Hepatic function panel     Status: None   Collection Time: 04/29/18  1:55 PM  Result Value Ref Range   Total Bilirubin 0.6 0.2 - 1.2 mg/dL   Bilirubin, Direct 0.1 0.0 - 0.3 mg/dL   Alkaline Phosphatase 53 39 - 117 U/L   AST 17 0 - 37 U/L   ALT 16 0 - 53 U/L   Total Protein 6.9 6.0 - 8.3 g/dL   Albumin 4.2 3.5 - 5.2 g/dL  CBC with Differential/Platelet     Status: None   Collection Time: 04/29/18  1:55 PM  Result Value Ref Range   WBC 7.1 4.0 - 10.5 K/uL   RBC 4.47 4.22 - 5.81 Mil/uL   Hemoglobin 13.7 13.0 - 17.0 g/dL   HCT 41.2 39.0 - 52.0 %   MCV 92.1 78.0 - 100.0 fl   MCHC 33.3 30.0 - 36.0 g/dL   RDW 14.1 11.5 - 15.5 %   Platelets 238.0 150.0 - 400.0 K/uL   Neutrophils Relative % 54.7 43.0 - 77.0 %   Lymphocytes Relative 31.0 12.0 - 46.0 %   Monocytes Relative 11.2 3.0 - 12.0 %   Eosinophils Relative 2.3 0.0 - 5.0 %   Basophils Relative 0.8 0.0 - 3.0 %   Neutro Abs 3.9 1.4 - 7.7 K/uL   Lymphs Abs 2.2 0.7 - 4.0 K/uL   Monocytes Absolute 0.8 0.1 - 1.0 K/uL   Eosinophils Absolute 0.2 0.0 - 0.7 K/uL   Basophils Absolute 0.1 0.0 - 0.1 K/uL  Lipid panel     Status: Abnormal   Collection Time: 04/29/18  1:55 PM  Result Value Ref Range   Cholesterol 140 0 - 200 mg/dL    Comment: ATP III Classification       Desirable:  < 200 mg/dL               Borderline High:  200 - 239 mg/dL          High:  > = 240 mg/dL   Triglycerides 211.0 (H) 0.0 - 149.0 mg/dL    Comment: Normal:  <150 mg/dLBorderline High:  150 - 199 mg/dL   HDL 49.10 >39.00 mg/dL   VLDL 42.2 (H) 0.0 - 40.0  mg/dL   Total CHOL/HDL Ratio 3     Comment:                Men          Women1/2 Average Risk     3.4          3.3Average Risk          5.0  4.42X Average Risk          9.6          7.13X Average Risk          15.0          11.0                       NonHDL 91.26     Comment: NOTE:  Non-HDL goal should be 30 mg/dL higher than patient's LDL goal (i.e. LDL goal of < 70 mg/dL, would have non-HDL goal of < 100 mg/dL)  Basic metabolic panel     Status: None   Collection Time: 04/29/18  1:55 PM  Result Value Ref Range   Sodium 139 135 - 145 mEq/L   Potassium 4.4 3.5 - 5.1 mEq/L   Chloride 103 96 - 112 mEq/L   CO2 29 19 - 32 mEq/L   Glucose, Bld 95 70 - 99 mg/dL   BUN 17 6 - 23 mg/dL   Creatinine, Ser 0.90 0.40 - 1.50 mg/dL   Calcium 9.3 8.4 - 10.5 mg/dL   GFR 87.77 >60.00 mL/min  TSH     Status: Abnormal   Collection Time: 04/29/18  1:55 PM  Result Value Ref Range   TSH 0.15 (L) 0.35 - 4.50 uIU/mL  LDL cholesterol, direct     Status: None   Collection Time: 04/29/18  1:55 PM  Result Value Ref Range   Direct LDL 74.0 mg/dL    Comment: Optimal:  <100 mg/dLNear or Above Optimal:  100-129 mg/dLBorderline High:  130-159 mg/dLHigh:  160-189 mg/dLVery High:  >190 mg/dL  Aldolase     Status: None   Collection Time: 05/03/18  1:00 PM  Result Value Ref Range   Aldolase 5.0 < OR = 8.1 U/L  Pulmonary function test     Status: None (Preliminary result)   Collection Time: 05/27/18  8:32 AM  Result Value Ref Range   FVC-Pre 3.12 L   FVC-%Pred-Pre 78 %   FVC-Post 3.08 L   FVC-%Pred-Post 77 %   FVC-%Change-Post -1 %   FEV1-Pre 2.80 L   FEV1-%Pred-Pre 96 %   FEV1-Post 2.81 L   FEV1-%Pred-Post 97 %   FEV1-%Change-Post 0 %   FEV6-Pre 3.12 L   FEV6-%Pred-Pre 83 %   FEV6-Post 3.08 L   FEV6-%Pred-Post 82 %   FEV6-%Change-Post -1 %   Pre FEV1/FVC ratio 90 %   FEV1FVC-%Pred-Pre 123 %   Post FEV1/FVC ratio 91 %   FEV1FVC-%Change-Post 1 %   Pre FEV6/FVC Ratio 100 %   FEV6FVC-%Pred-Pre 106  %   Post FEV6/FVC ratio 100 %   FEV6FVC-%Pred-Post 106 %   FEV6FVC-%Change-Post 0 %   FEF 25-75 Pre 4.97 L/sec   FEF2575-%Pred-Pre 232 %   FEF 25-75 Post 5.09 L/sec   FEF2575-%Pred-Post 237 %   FEF2575-%Change-Post 2 %  TSH     Status: Abnormal   Collection Time: 06/07/18  2:24 PM  Result Value Ref Range   TSH 0.28 (L) 0.35 - 4.50 uIU/mL    Assessment/Plan: HYPOTHYROIDISM, POST-RADIOACTIVE IODINE Taking new dose as directed. Repeat TSH today. Will alter dose accordingly.   HTN (hypertension) BP normotensive. Asymptomatic. Continue current regimen.   Preoperative clearance EKG with NSR. Vitals stable. Lab panel today. Will send clearance once results are in.    Leeanne Rio, PA-C

## 2018-07-28 NOTE — Assessment & Plan Note (Signed)
EKG with NSR. Vitals stable. Lab panel today. Will send clearance once results are in.

## 2018-07-28 NOTE — Assessment & Plan Note (Signed)
BP normotensive. Asymptomatic. Continue current regimen.  

## 2018-07-28 NOTE — Assessment & Plan Note (Signed)
Taking new dose as directed. Repeat TSH today. Will alter dose accordingly.

## 2018-07-28 NOTE — Patient Instructions (Signed)
Please go to the lab today for blood work.  I will call you with your results. We will alter treatment regimen(s) if indicated by your results.   For now we will plan on follow-up in 6 months.  I will send in your surgical clearance once labs have resulted.

## 2018-07-29 LAB — URINE CULTURE
MICRO NUMBER:: 91515144
SPECIMEN QUALITY:: ADEQUATE

## 2018-07-29 NOTE — Telephone Encounter (Signed)
Pt is scheduled to see [name] NP, on 08/17/17. Clearance will be addressed at visit.

## 2018-07-29 NOTE — Telephone Encounter (Signed)
1st attempt: Left detail message for pt to call back and schedule appt for surgical clearance.

## 2018-07-30 ENCOUNTER — Encounter: Payer: Self-pay | Admitting: Physician Assistant

## 2018-07-30 NOTE — Telephone Encounter (Signed)
See result note sent. Ok to respond via MyChart or call patient.

## 2018-07-31 ENCOUNTER — Encounter: Payer: Self-pay | Admitting: Physician Assistant

## 2018-08-02 ENCOUNTER — Other Ambulatory Visit: Payer: Self-pay | Admitting: *Deleted

## 2018-08-02 MED ORDER — LEVOTHYROXINE SODIUM 112 MCG PO TABS: 112.0000 ug | ORAL_TABLET | Freq: Every day | ORAL | 1 refills | Status: DC

## 2018-08-17 ENCOUNTER — Ambulatory Visit (INDEPENDENT_AMBULATORY_CARE_PROVIDER_SITE_OTHER): Payer: Medicare Other | Admitting: Nurse Practitioner

## 2018-08-17 ENCOUNTER — Encounter: Payer: Self-pay | Admitting: Nurse Practitioner

## 2018-08-17 ENCOUNTER — Telehealth (HOSPITAL_COMMUNITY): Payer: Self-pay | Admitting: *Deleted

## 2018-08-17 VITALS — BP 118/82 | HR 90 | Ht 69.0 in | Wt 197.8 lb

## 2018-08-17 DIAGNOSIS — Z01818 Encounter for other preprocedural examination: Secondary | ICD-10-CM | POA: Diagnosis not present

## 2018-08-17 DIAGNOSIS — Z01810 Encounter for preprocedural cardiovascular examination: Secondary | ICD-10-CM | POA: Diagnosis not present

## 2018-08-17 DIAGNOSIS — I712 Thoracic aortic aneurysm, without rupture, unspecified: Secondary | ICD-10-CM

## 2018-08-17 NOTE — Progress Notes (Signed)
CARDIOLOGY OFFICE NOTE  Date:  08/17/2018    Bryan Tyler Date of Birth: 1945-12-29 Medical Record #[id number]  PCP:  Delorse Limber  Cardiologist:  [facility]    Chief Complaint  Patient presents with  . Pre-op Exam    Surgical clearance - seen for Dr. Irish Lack    History of Present Illness: Bryan Tyler is a 73 y.o. male who presents today for a surgical clearance visit. Seen for Dr. Irish Lack.   He has a history of thoracic aneurysm followed by Dr. Servando Snare with annual imaging studies. FH is + for CAD with Father had CABG x3 at age 13 and died at 15.  Brother passed away in car accident.  No one with stents in his generation. Remote smoker. Has ILD - followed by pulmonary along with HDL - on statin.   Last seen here in January - felt to be doing ok was recovering from shoulder surgery - was referred for GXT - this was ok but showed poor exercise tolerance.   Comes in today. Here alone. Wanting surgical clearance for knee replacement. This is not scheduled yet. He has no chest pain. He has DOE after 3 flights of stairs. He can walk 2 blocks. He is going to the gym - uses the treadmill at no incline and low speed due to his knee. Tries to remain active. On medicine for ILD. Postural lightheadedness occasionally - no frank syncope. He has no known CAD - but says he was told of an abnormal EKG in the past and "due to prior heart attack".   Past Medical History:  Diagnosis Date  . Adenocarcinoma of prostate ([place]) 04/16/2013  . Aneurysm, aorta, thoracic ([place])   . Anxiety   . Arthritis    "knees" (05/21/2017)  . Depression   . Diverticulosis   . Dyspnea    on exertion for a long period time  . GERD (gastroesophageal reflux disease)   . Hyperlipidemia   . Hypertension   . Hypothyroidism, postradioiodine therapy    1980's  . Nocturia   . Organic impotence   . OSA (obstructive sleep apnea)    NO CPAP--  S/P SURGERY  2002  . Osteoporosis   . Pneumonia 1970    . PONV (postoperative nausea and vomiting)   . Urge urinary incontinence   . Wears contact lenses     Past Surgical History:  Procedure Laterality Date  . HEMORRHOIDECTOMY WITH HEMORRHOID BANDING  2007  . INGUINAL HERNIA REPAIR Bilateral    x4  (2 each side)  . KNEE ARTHROSCOPY Right 2012  . NASAL SEPTUM SURGERY  1982   w/ rhinoplasty  . PROSTATE BIOPSY    . RADIOACTIVE SEED IMPLANT N/A 05/30/2013   Procedure: RADIOACTIVE SEED IMPLANT;  Surgeon: Hanley Ben, MD;  Location: [facility];  Service: Urology;  Laterality: N/A;  . REVERSE SHOULDER ARTHROPLASTY Right 05/21/2017  . REVERSE SHOULDER ARTHROPLASTY Right 05/21/2017  . REVERSE SHOULDER ARTHROPLASTY Right 05/21/2017   Procedure: REVERSE RIGHT SHOULDER ARTHROPLASTY;  Surgeon: Justice Britain, MD;  Location: [facility];  Service: Orthopedics;  Laterality: Right;  . RHINOPLASTY  1982   w/w/ septoplasty  . SHOULDER ARTHROSCOPY W/ ROTATOR CUFF REPAIR Right 2004  . SHOULDER ARTHROSCOPY W/ ROTATOR CUFF REPAIR Left 2016  . TONSILLECTOMY  AS CHILD  . UVULOPALATOPHARYNGOPLASTY  2002     Medications: Current Meds  Medication Sig  . ARIPiprazole (ABILIFY) 10 MG tablet Take 0.5 tablets (5 mg total) by  mouth daily.  Marland Kitchen aspirin 81 MG tablet Take 81 mg by mouth at bedtime.   Marland Kitchen atorvastatin (LIPITOR) 40 MG tablet TAKE 1 TABLET EVERY EVENING  . busPIRone (BUSPAR) 15 MG tablet Take 1 tablet (15 mg total) by mouth 2 (two) times daily.  . clonazePAM (KLONOPIN) 1 MG tablet Take 1 tablet (1 mg total) by mouth at bedtime.  . gabapentin (NEURONTIN) 400 MG capsule Take 2 capsules (800 mg total) by mouth at bedtime.  Marland Kitchen levothyroxine (SYNTHROID, LEVOTHROID) 112 MCG tablet Take 1 tablet (112 mcg total) by mouth daily.  Marland Kitchen losartan (COZAAR) 100 MG tablet TAKE 1 TABLET(100 MG) BY MOUTH DAILY  . Multiple Vitamin (MULTIVITAMIN WITH MINERALS) TABS tablet Take 1 tablet by mouth daily.  Marland Kitchen omeprazole (PRILOSEC) 40 MG capsule Take 1 capsule (40 mg  total) by mouth daily as needed (heartburn).  Marland Kitchen oxymetazoline (ANEFRIN NASAL SPRAY) 0.05 % nasal spray Place 1 spray into both nostrils as needed for congestion.   . Pirfenidone (ESBRIET) 267 MG CAPS Take 3 capsules by mouth 3 (three) times daily. Lung scarring  . tamsulosin (FLOMAX) 0.4 MG CAPS capsule Take 1 capsule (0.4 mg total) by mouth daily. (Patient taking differently: Take 0.4 mg by mouth every evening. )  . venlafaxine XR (EFFEXOR-XR) 150 MG 24 hr capsule Take 2 capsules (300 mg total) by mouth daily with breakfast.     Allergies: Allergies  Allergen Reactions  . Codeine Nausea And Vomiting and Other (See Comments)    DIZZINESS  . Ciprofloxacin     Social History: The patient  reports that he quit smoking about 30 years ago. His smoking use included cigarettes. He has a 20.00 pack-year smoking history. He has never used smokeless tobacco. He reports current alcohol use. He reports that he does not use drugs.   Family History: The patient's family history includes Cancer in his mother; Heart disease (age of onset: 32) in his father.   Review of Systems: Please see the history of present illness.   Otherwise, the review of systems is positive for none.   All other systems are reviewed and negative.   Physical Exam: VS:  BP 118/82 (BP Location: Left Arm, Patient Position: Sitting, Cuff Size: Normal)   Pulse 90   Ht 5\' 9"  (1.753 m)   Wt 197 lb 12.8 oz (89.7 kg)   BMI 29.21 kg/m  .  BMI Body mass index is 29.21 kg/m.  Wt Readings from Last 3 Encounters:  08/17/18 197 lb 12.8 oz (89.7 kg)  07/28/18 196 lb 6.4 oz (89.1 kg)  06/21/18 194 lb (88 kg)    General: Very pleasant. Well developed, well nourished and in no acute distress.   HEENT: Normal.  Neck: Supple, no JVD, carotid bruits, or masses noted.  Cardiac: Regular rate and rhythm. No murmurs, rubs, or gallops. No edema.  Respiratory:  Lungs are clear to auscultation bilaterally with normal work of breathing.  GI:  Soft and nontender.  MS: No deformity or atrophy. Gait and ROM intact.  Skin: Warm and dry. Color is normal.  Neuro:  Strength and sensation are intact and no gross focal deficits noted.  Psych: Alert, appropriate and with normal affect.   LABORATORY DATA:  EKG:  EKG is ordered today. This demonstrates NSR with inferior Q's.   Lab Results  Component Value Date   WBC 8.1 07/28/2018   HGB 14.9 07/28/2018   HCT 44.3 07/28/2018   PLT 251.0 07/28/2018   GLUCOSE 105 (H) 07/28/2018  CHOL 140 04/29/2018   TRIG 211.0 (H) 04/29/2018   HDL 49.10 04/29/2018   LDLDIRECT 74.0 04/29/2018   LDLCALC 100 (H) 02/06/2014   ALT 20 07/28/2018   AST 19 07/28/2018   NA 137 07/28/2018   K 4.5 07/28/2018   CL 102 07/28/2018   CREATININE 0.99 07/28/2018   BUN 18 07/28/2018   CO2 26 07/28/2018   TSH 1.45 07/28/2018   PSA 0.04 (L) 04/14/2017   INR 0.93 05/24/2013     BNP (last 3 results) No results for input(s): BNP in the last 8760 hours.  ProBNP (last 3 results) No results for input(s): PROBNP in the last 8760 hours.   Other Studies Reviewed Today:  GXT 09/2017 Study Highlights     Blood pressure demonstrated a normal response to exercise.  There was no ST segment deviation noted during stress.  Poor exercise effort, 3 minutes and 6 seconds  No ischemic changes noted on ECG during stress.  Given poor overall exercise effort, consider further cardiac testing if symptoms warrant.   Candee Furbish, MD   Echo Study Conclusions 01/2016  - Left ventricle: The cavity size was normal. There was moderate   concentric hypertrophy. Systolic function was normal. The   estimated ejection fraction was in the range of 60% to 65%. Wall   motion was normal; there were no regional wall motion   abnormalities. Doppler parameters are consistent with abnormal   left ventricular relaxation (grade 1 diastolic dysfunction).   Doppler parameters are consistent with indeterminate ventricular    filling pressure. - Aortic valve: Transvalvular velocity was within the normal range.   There was no stenosis. There was mild regurgitation.   Regurgitation pressure half-time: 586 ms. - Mitral valve: Transvalvular velocity was within the normal range.   There was no evidence for stenosis. There was no regurgitation. - Right ventricle: The cavity size was normal. Wall thickness was   normal. Systolic function was normal. - Tricuspid valve: There was mild regurgitation. - Pulmonary arteries: Systolic pressure was within the normal   range. PA peak pressure: 29 mm Hg (S).   Assessment/Plan:  1. Pre op clearance for knee replacement - no active symptoms but with abnormal EKG and multiple CV risk factors (+FH, HLD, gender, remote smoker) - he had GXT almost a year ago with poor exercise tolerance noted -  will arrange Lexiscan. He did not have RWMA on echo from 2017 - Dr. Irish Lack did not feel like he had had a prior MI but given need for upcoming surgery - will arrange for further testing. Further disposition to follow. If ok, ok to proceed with surgery and hold aspirin as needed.   2. Thoracic aneurysm - monitored by Dr. Servando Snare - last scan from July noted.   3. HLD - on statin - labs per PCP noted.  4. ILD - followed by pulmonary    Current medicines are reviewed with the patient today.  The patient does not have concerns regarding medicines other than what has been noted above.  The following changes have been made:  See above.  Labs/ tests ordered today include:    Orders Placed This Encounter  Procedures  . EKG 12-Lead     Disposition:   FU with Dr. Irish Lack as planned.  I am happy to see back as needed.   Patient is agreeable to this plan and will call if any problems develop in the interim.   SignedTruitt Merle, NP  08/17/2018 9:47 AM  [facility] [address] [place], [place]  [address] Phone: [phone] Fax: [phone]

## 2018-08-17 NOTE — Patient Instructions (Addendum)
We will be checking the following labs today - NONE   Medication Instructions:    Continue with your current medicines.    If you need a refill on your cardiac medications before your next appointment, please call your pharmacy.     Testing/Procedures To Be Arranged:  Lexiscan Myoview  Follow-Up:   See Dr. Beau Fanny back as planned    At [facility], you and your health needs are our priority.  As part of our continuing mission to provide you with exceptional heart care, we have created designated Provider Care Teams.  These Care Teams include your primary Cardiologist (physician) and Advanced Practice Providers (APPs -  Physician Assistants and Nurse Practitioners) who all work together to provide you with the care you need, when you need it.  Special Instructions:  You are scheduled for a Myocardial Perfusion Imaging Study on ________________________ at ____________________________________.   Please arrive 15 minutes prior to your appointment time for registration and insurance purposes.   The test will take approximately 3 to 4 hours to complete; you may bring reading material. If someone comes with you to your appointment, they will need to remain in the main lobby due to limited space in the testing area.   How to prepare for your Myocardial Perfusion test:   Do not eat or drink 3 hours prior to your test, except you may have water.    Do not consume products containing caffeine (regular or decaffeinated) 12 hours prior to your test (ex: coffee, chocolate, soda, tea)   Do bring a list of your current medications with you. If not listed below, you may take your medications as normal.   Bring any held medication to your appointment, as you may be required to take it once the test is complete.   Do wear comfortable clothes (no dresses or overalls) and walking shoes. Tennis shoes are preferred. No heels or open toed shoes.  Do not wear cologne, perfume, aftershave or  lotions (deodorant is allowed).   If these instructions are not followed, you test will have to be rescheduled.   Please report to [address] for your test. If you have questions or concerns about your appointment, please call the Nuclear Lab at [phone].  If you cannot keep your appointment, please provide 24 hour notification to the Nuclear lab to avoid a possible $50 charge to your account.      Call the [place] office at [phone] if you have any questions, problems or concerns.

## 2018-08-17 NOTE — Telephone Encounter (Signed)
Patient given detailed instructions per Myocardial Perfusion Study Information Sheet for the test on 08/18/18 at 1015. Patient notified to arrive 15 minutes early and that it is imperative to arrive on time for appointment to keep from having the test rescheduled.  If you need to cancel or reschedule your appointment, please call the office within 24 hours of your appointment. . Patient verbalized understanding. Patient did not a letter with instructions by myChart.Jamy Cleckler, Ranae [place]ms

## 2018-08-18 ENCOUNTER — Ambulatory Visit (HOSPITAL_COMMUNITY): Payer: Medicare Other | Attending: Cardiovascular Disease

## 2018-08-18 DIAGNOSIS — Z01810 Encounter for preprocedural cardiovascular examination: Secondary | ICD-10-CM | POA: Insufficient documentation

## 2018-08-18 LAB — MYOCARDIAL PERFUSION IMAGING
LV dias vol: 84 mL (ref 62–150)
LV sys vol: 36 mL
Peak HR: 90 {beats}/min
Rest HR: 65 {beats}/min
SDS: 0
SRS: 0
SSS: 0
TID: 1.06

## 2018-08-18 MED ORDER — TECHNETIUM TC 99M TETROFOSMIN IV KIT
32.8000 | PACK | Freq: Once | INTRAVENOUS | Status: AC | PRN
  Administered 2018-08-18: 32.8 via INTRAVENOUS
  Filled 2018-08-18: qty 33

## 2018-08-18 MED ORDER — TECHNETIUM TC 99M TETROFOSMIN IV KIT
10.2000 | PACK | Freq: Once | INTRAVENOUS | Status: AC | PRN
  Administered 2018-08-18: 10.2 via INTRAVENOUS
  Filled 2018-08-18: qty 11

## 2018-08-18 MED ORDER — REGADENOSON 0.4 MG/5ML IV SOLN
0.4000 mg | Freq: Once | INTRAVENOUS | Status: AC
  Administered 2018-08-18: 0.4 mg via INTRAVENOUS

## 2018-08-30 ENCOUNTER — Ambulatory Visit: Payer: Medicare Other | Admitting: Neurology

## 2018-08-31 ENCOUNTER — Telehealth: Payer: Self-pay | Admitting: Psychiatry

## 2018-08-31 NOTE — Telephone Encounter (Signed)
Pt rx for Clonazepam will run out soon 1 1/2 tablets at bedtime. [facility]

## 2018-09-01 ENCOUNTER — Other Ambulatory Visit: Payer: Self-pay

## 2018-09-01 DIAGNOSIS — M1711 Unilateral primary osteoarthritis, right knee: Secondary | ICD-10-CM | POA: Diagnosis not present

## 2018-09-01 NOTE — Telephone Encounter (Signed)
Has already been submitted to [facility] for 90 day

## 2018-09-01 NOTE — Telephone Encounter (Signed)
See phone message

## 2018-09-01 NOTE — Telephone Encounter (Signed)
°  Please fax clearance letter to [name].

## 2018-09-02 ENCOUNTER — Ambulatory Visit: Payer: Medicare Other | Admitting: Neurology

## 2018-09-02 DIAGNOSIS — M1711 Unilateral primary osteoarthritis, right knee: Secondary | ICD-10-CM | POA: Diagnosis not present

## 2018-09-06 ENCOUNTER — Telehealth: Payer: Self-pay | Admitting: *Deleted

## 2018-09-06 NOTE — Telephone Encounter (Signed)
Left message for [name] at [name] pt was cleared for surgery per [name], NP and that [name] has forwarded to Dr. Theda Sers a copy of results stating clearance.  If any questions please call [phone].

## 2018-09-07 NOTE — Telephone Encounter (Signed)
° °  PLEASE FAX CLEARANCE TO [phone]

## 2018-09-07 NOTE — Telephone Encounter (Signed)
   Preoperative assessment provided by [name] NP 08/17/2018 was faxed to Dr. Theda Sers' office today.   Abigail Butts, PA-C 09/07/18, 4:45 PM

## 2018-09-08 NOTE — H&P (Signed)
TOTAL KNEE ADMISSION H&P  Patient is being admitted for right total knee arthroplasty.  Subjective:  Chief Complaint:right knee pain.  HPI: Bryan Tyler, 73 y.o. male, has a history of pain and functional disability in the right knee due to arthritis and has failed non-surgical conservative treatments for greater than 12 weeks to includecorticosteriod injections, flexibility and strengthening excercises, use of assistive devices and activity modification.  Onset of symptoms was gradual, starting 3 years ago with gradually worsening course since that time. The patient noted no past surgery on the right knee(s).  Patient currently rates pain in the right knee(s) at 8 out of 10 with activity. Patient has worsening of pain with activity and weight bearing, pain that interferes with activities of daily living, pain with passive range of motion and joint swelling.  Patient has evidence of subchondral sclerosis, periarticular osteophytes and joint space narrowing by imaging studies. There is no active infection.  Patient Active Problem List   Diagnosis Date Noted  . Preoperative clearance 07/28/2018  . Dyspnea 06/02/2017  . S/p reverse total shoulder arthroplasty 05/21/2017  . Arthralgia 04/14/2017  . Foot pain 12/25/2016  . HTN (hypertension) 10/16/2015  . Rectal bleeding 08/15/2014  . Adenocarcinoma of prostate ([place]) 04/16/2013  . Nonspecific abnormal electrocardiogram (ECG) (EKG) 01/24/2013  . Abnormal PSA 01/24/2013  . Hemorrhoids 05/31/2012  . Screening for prostate cancer 01/21/2012  . Renal cysts, acquired, bilateral 07/15/2011  . Medication side effect 07/08/2011  . HEARING LOSS 09/23/2010  . SLEEP APNEA, OBSTRUCTIVE 03/05/2010  . ORGANIC IMPOTENCE 02/22/2009  . PARESTHESIA 02/22/2009  . Internal hemorrhoids 10/31/2008  . FOLLICULITIS, CHRONIC 96/78/9381  . Anxiety 05/09/2008  . HYPOTHYROIDISM, POST-RADIOACTIVE IODINE 10/21/2007  . Dyslipidemia 10/21/2007  . Depression  10/21/2007  . GERD 10/21/2007  . RECTAL BLEEDING 10/21/2007  . Osteoporosis 10/21/2007  . INSOMNIA 10/21/2007   Past Medical History:  Diagnosis Date  . Adenocarcinoma of prostate ([place]) 04/16/2013  . Aneurysm, aorta, thoracic ([place])   . Anxiety   . Arthritis    "knees" (05/21/2017)  . Depression   . Diverticulosis   . Dyspnea    on exertion for a long period time  . GERD (gastroesophageal reflux disease)   . Hyperlipidemia   . Hypertension   . Hypothyroidism, postradioiodine therapy    1980's  . Nocturia   . Organic impotence   . OSA (obstructive sleep apnea)    NO CPAP--  S/P SURGERY  2002  . Osteoporosis   . Pneumonia 1970  . PONV (postoperative nausea and vomiting)   . Urge urinary incontinence   . Wears contact lenses     Past Surgical History:  Procedure Laterality Date  . HEMORRHOIDECTOMY WITH HEMORRHOID BANDING  2007  . INGUINAL HERNIA REPAIR Bilateral    x4  (2 each side)  . KNEE ARTHROSCOPY Right 2012  . NASAL SEPTUM SURGERY  1982   w/ rhinoplasty  . PROSTATE BIOPSY    . RADIOACTIVE SEED IMPLANT N/A 05/30/2013   Procedure: RADIOACTIVE SEED IMPLANT;  Surgeon: Hanley Ben, MD;  Location: [place];  Service: Urology;  Laterality: N/A;  . REVERSE SHOULDER ARTHROPLASTY Right 05/21/2017  . REVERSE SHOULDER ARTHROPLASTY Right 05/21/2017  . REVERSE SHOULDER ARTHROPLASTY Right 05/21/2017   Procedure: REVERSE RIGHT SHOULDER ARTHROPLASTY;  Surgeon: Justice Britain, MD;  Location: [place];  Service: Orthopedics;  Laterality: Right;  . RHINOPLASTY  1982   w/w/ septoplasty  . SHOULDER ARTHROSCOPY W/ ROTATOR CUFF REPAIR Right 2004  . SHOULDER ARTHROSCOPY W/  ROTATOR CUFF REPAIR Left 2016  . TONSILLECTOMY  AS CHILD  . UVULOPALATOPHARYNGOPLASTY  2002    No current facility-administered medications for this encounter.    Current Outpatient Medications  Medication Sig Dispense Refill Last Dose  . ARIPiprazole (ABILIFY) 10 MG tablet Take 0.5 tablets (5 mg  total) by mouth daily. 90 tablet 1 Taking  . aspirin 81 MG tablet Take 81 mg by mouth at bedtime.    Taking  . atorvastatin (LIPITOR) 40 MG tablet TAKE 1 TABLET EVERY EVENING 90 tablet 2 Taking  . busPIRone (BUSPAR) 15 MG tablet Take 1 tablet (15 mg total) by mouth 2 (two) times daily. 180 tablet 1 Taking  . clonazePAM (KLONOPIN) 1 MG tablet Take 1 tablet (1 mg total) by mouth at bedtime. 90 tablet 1 Taking  . gabapentin (NEURONTIN) 400 MG capsule Take 2 capsules (800 mg total) by mouth at bedtime. 180 capsule 1 Taking  . levothyroxine (SYNTHROID, LEVOTHROID) 112 MCG tablet Take 1 tablet (112 mcg total) by mouth daily. 90 tablet 1 Taking  . losartan (COZAAR) 100 MG tablet TAKE 1 TABLET(100 MG) BY MOUTH DAILY 90 tablet 2 Taking  . Multiple Vitamin (MULTIVITAMIN WITH MINERALS) TABS tablet Take 1 tablet by mouth daily.   Taking  . omeprazole (PRILOSEC) 40 MG capsule Take 1 capsule (40 mg total) by mouth daily as needed (heartburn). 90 capsule 1 Taking  . oxymetazoline (ANEFRIN NASAL SPRAY) 0.05 % nasal spray Place 1 spray into both nostrils as needed for congestion.    Taking  . Pirfenidone (ESBRIET) 267 MG CAPS Take 3 capsules by mouth 3 (three) times daily. Lung scarring   Taking  . tamsulosin (FLOMAX) 0.4 MG CAPS capsule Take 1 capsule (0.4 mg total) by mouth daily. (Patient taking differently: Take 0.4 mg by mouth every evening. ) 30 capsule 3 Taking  . venlafaxine XR (EFFEXOR-XR) 150 MG 24 hr capsule Take 2 capsules (300 mg total) by mouth daily with breakfast. 180 capsule 1 Taking   Allergies  Allergen Reactions  . Codeine Nausea And Vomiting and Other (See Comments)    DIZZINESS  . Ciprofloxacin     Social History   Tobacco Use  . Smoking status: Former Smoker    Packs/day: 1.00    Years: 20.00    Pack years: 20.00    Types: Cigarettes    Last attempt to quit: 08/11/1988    Years since quitting: 30.0  . Smokeless tobacco: Never Used  Substance Use Topics  . Alcohol use: Yes     Comment: 05/21/2017 "1-2 beers/month"    Family History  Problem Relation Age of Onset  . Cancer Mother        Pancreatic Cancer  . Heart disease Father 25       CABG     Review of Systems  Constitutional: Negative.   HENT: Negative.   Eyes: Negative.   Respiratory: Negative.   Cardiovascular: Negative.   Gastrointestinal: Positive for heartburn.  Genitourinary: Negative.   Musculoskeletal: Positive for joint pain.  Skin: Negative.   Neurological: Negative.   Endo/Heme/Allergies: Negative.   Psychiatric/Behavioral: Negative.     Objective:  Physical Exam  Constitutional: He is oriented to person, place, and time. He appears well-developed.  HENT:  Head: Normocephalic.  Eyes: EOM are normal.  Neck: Normal range of motion.  Cardiovascular: Normal rate and intact distal pulses.  Respiratory: Effort normal.  GI: Soft.  Genitourinary:    Genitourinary Comments: Deferred   Musculoskeletal:     Comments:  Knee pain with ROM. Knee is stable at varus and valgus stress. Calf is soft.  Neurological: He is alert and oriented to person, place, and time.  Skin: Skin is warm and dry.  Psychiatric: His behavior is normal.    Vital signs in last 24 hours: BP: ()/()  Arterial Line BP: ()/()   Labs:   Estimated body mass index is 29.09 kg/m as calculated from the following:   Height as of 08/18/18: 5\' 9"  (1.753 m).   Weight as of 08/18/18: 89.4 kg.   Imaging Review Plain radiographs demonstrate severe degenerative joint disease of the right knee(s). The overall alignment ismild varus. The bone quality appears to be good for age and reported activity level.   Preoperative templating of the joint replacement has been completed, documented, and submitted to the Operating Room personnel in order to optimize intra-operative equipment management.   Anticipated LOS equal to or greater than 2 midnights due to - Age 26 and older with one or more of the following:  - Obesity  -  Expected need for hospital services (PT, OT, Nursing) required for safe  discharge  - Anticipated need for postoperative skilled nursing care or inpatient rehab  - Active co-morbidities: None OR   - Unanticipated findings during/Post Surgery: None  - Patient is a high risk of re-admission due to: None     Assessment/Plan:  End stage arthritis, right knee   The patient history, physical examination, clinical judgment of the provider and imaging studies are consistent with end stage degenerative joint disease of the right knee(s) and total knee arthroplasty is deemed medically necessary. The treatment options including medical management, injection therapy arthroscopy and arthroplasty were discussed at length. The risks and benefits of total knee arthroplasty were presented and reviewed. The risks due to aseptic loosening, infection, stiffness, patella tracking problems, thromboembolic complications and other imponderables were discussed. The patient acknowledged the explanation, agreed to proceed with the plan and consent was signed. Patient is being admitted for inpatient treatment for surgery, pain control, PT, OT, prophylactic antibiotics, VTE prophylaxis, progressive ambulation and ADL's and discharge planning. The patient is planning to be discharged home wit OPPT.

## 2018-09-15 ENCOUNTER — Ambulatory Visit: Payer: Self-pay | Admitting: Orthopedic Surgery

## 2018-09-21 NOTE — Progress Notes (Signed)
09-01-18 Cardiac Clearance on chart from Bernette Mayers  08-18-18 [facility]) Cardiac Clearance in Stress Test Report   08-18-18 (Epic) Stress Test  02-22-18 (Epic) CT Angio Chest Aorta for f/u of Thoracic Aortic Aneurysm

## 2018-09-21 NOTE — Patient Instructions (Addendum)
Bryan Tyler  09/21/2018   Your procedure is scheduled on: 09-24-18    Report to [facility] Main  Entrance    Report to Admitting at 5:30 AM    Call this number if you have problems the morning of surgery [phone]    Remember: Do not eat food or drink liquids :After Midnight.    BRUSH YOUR TEETH MORNING OF SURGERY AND RINSE YOUR MOUTH OUT, NO CHEWING GUM CANDY OR MINTS.     Take these medicines the morning of surgery with A SIP OF WATER: Amlodipine (Norvasc), Aripiprazole (Abilify), Buspirone (Buspar), Levothyroxine (Synthroid) and Venlafaxine XR (Effexor-XR) and Omeprazole (Prilosec), prn. You may also use and bring your nasal spray.                                   You may not have any metal on your body including hair pins and              piercings  Do not wear jewelry, colgne, lotions, powders or deodorant             Men may shave face and neck.   Do not bring valuables to the hospital. [place].  Contacts, dentures or bridgework may not be worn into surgery.  Leave suitcase in the car. After surgery it may be brought to your room.     Patients discharged the day of surgery will not be allowed to drive home. IF YOU ARE HAVING SURGERY AND GOING HOME THE SAME DAY, YOU MUST HAVE AN ADULT TO DRIVE YOU HOME AND BE WITH YOU FOR 24 HOURS. YOU MAY GO HOME BY TAXI OR UBER OR ORTHERWISE, BUT AN ADULT MUST ACCOMPANY YOU HOME AND STAY WITH YOU FOR 24 HOURS.    Special Instructions: N/A              Please read over the following fact sheets you were given: _____________________________________________________________________             [facility] - Preparing for Surgery Before surgery, you can play an important role.  Because skin is not sterile, your skin needs to be as free of germs as possible.  You can reduce the number of germs on your skin by washing with CHG (chlorahexidine gluconate) soap  before surgery.  CHG is an antiseptic cleaner which kills germs and bonds with the skin to continue killing germs even after washing. Please DO NOT use if you have an allergy to CHG or antibacterial soaps.  If your skin becomes reddened/irritated stop using the CHG and inform your nurse when you arrive at Short Stay. Do not shave (including legs and underarms) for at least 48 hours prior to the first CHG shower.  You may shave your face/neck. Please follow these instructions carefully:  1.  Shower with CHG Soap the night before surgery and the  morning of Surgery.  2.  If you choose to wash your hair, wash your hair first as usual with your  normal  shampoo.  3.  After you shampoo, rinse your hair and body thoroughly to remove the  shampoo.  4.  Use CHG as you would any other liquid soap.  You can apply chg directly  to the skin and wash                       Gently with a scrungie or clean washcloth.  5.  Apply the CHG Soap to your body ONLY FROM THE NECK DOWN.   Do not use on face/ open                           Wound or open sores. Avoid contact with eyes, ears mouth and genitals (private parts).                       Wash face,  Genitals (private parts) with your normal soap.             6.  Wash thoroughly, paying special attention to the area where your surgery  will be performed.  7.  Thoroughly rinse your body with warm water from the neck down.  8.  DO NOT shower/wash with your normal soap after using and rinsing off  the CHG Soap.                9.  Pat yourself dry with a clean towel.            10.  Wear clean pajamas.            11.  Place clean sheets on your bed the night of your first shower and do not  sleep with pets. Day of Surgery : Do not apply any lotions/deodorants the morning of surgery.  Please wear clean clothes to the hospital/surgery center.  FAILURE TO FOLLOW THESE INSTRUCTIONS MAY RESULT IN THE CANCELLATION OF YOUR SURGERY PATIENT  SIGNATURE_________________________________  NURSE SIGNATURE__________________________________  ________________________________________________________________________   [name]  An incentive spirometer is a tool that can help keep your lungs clear and active. This tool measures how well you are filling your lungs with each breath. Taking long deep breaths may help reverse or decrease the chance of developing breathing (pulmonary) problems (especially infection) following:  A long period of time when you are unable to move or be active. BEFORE THE PROCEDURE   If the spirometer includes an indicator to show your best effort, your nurse or respiratory therapist will set it to a desired goal.  If possible, sit up straight or lean slightly forward. Try not to slouch.  Hold the incentive spirometer in an upright position. INSTRUCTIONS FOR USE  1. Sit on the edge of your bed if possible, or sit up as far as you can in bed or on a chair. 2. Hold the incentive spirometer in an upright position. 3. Breathe out normally. 4. Place the mouthpiece in your mouth and seal your lips tightly around it. 5. Breathe in slowly and as deeply as possible, raising the piston or the ball toward the top of the column. 6. Hold your breath for 3-5 seconds or for as long as possible. Allow the piston or ball to fall to the bottom of the column. 7. Remove the mouthpiece from your mouth and breathe out normally. 8. Rest for a few seconds and repeat Steps 1 through 7 at least 10 times every 1-2 hours when you are awake. Take your time and take a few normal breaths between deep breaths. 9. The spirometer may include an indicator to show  your best effort. Use the indicator as a goal to work toward during each repetition. 10. After each set of 10 deep breaths, practice coughing to be sure your lungs are clear. If you have an incision (the cut made at the time of surgery), support your incision when coughing  by placing a pillow or rolled up towels firmly against it. Once you are able to get out of bed, walk around indoors and cough well. You may stop using the incentive spirometer when instructed by your caregiver.  RISKS AND COMPLICATIONS  Take your time so you do not get dizzy or light-headed.  If you are in pain, you may need to take or ask for pain medication before doing incentive spirometry. It is harder to take a deep breath if you are having pain. AFTER USE  Rest and breathe slowly and easily.  It can be helpful to keep track of a log of your progress. Your caregiver can provide you with a simple table to help with this. If you are using the spirometer at home, follow these instructions: [place] IF:   You are having difficultly using the spirometer.  You have trouble using the spirometer as often as instructed.  Your pain medication is not giving enough relief while using the spirometer.  You develop fever of 100.5 F (38.1 C) or higher. SEEK IMMEDIATE MEDICAL CARE IF:   You cough up bloody sputum that had not been present before.  You develop fever of 102 F (38.9 C) or greater.  You develop worsening pain at or near the incision site. MAKE SURE YOU:   Understand these instructions.  Will watch your condition.  Will get help right away if you are not doing well or get worse. Document Released: 12/08/2006 Document Revised: 10/20/2011 Document Reviewed: 02/08/2007 ExitCare Patient Information 2014 Exit[employer], [place].   ________________________________________________________________________  WHAT IS A BLOOD TRANSFUSION? Blood Transfusion Information  A transfusion is the replacement of blood or some of its parts. Blood is made up of multiple cells which provide different functions.  Red blood cells carry oxygen and are used for blood loss replacement.  White blood cells fight against infection.  Platelets control bleeding.  Plasma helps clot  blood.  Other blood products are available for specialized needs, such as hemophilia or other clotting disorders. BEFORE THE TRANSFUSION  Who gives blood for transfusions?   Healthy volunteers who are fully evaluated to make sure their blood is safe. This is blood bank blood. Transfusion therapy is the safest it has ever been in the practice of medicine. Before blood is taken from a donor, a complete history is taken to make sure that person has no history of diseases nor engages in risky social behavior (examples are intravenous drug use or sexual activity with multiple partners). The donor's travel history is screened to minimize risk of transmitting infections, such as malaria. The donated blood is tested for signs of infectious diseases, such as HIV and hepatitis. The blood is then tested to be sure it is compatible with you in order to minimize the chance of a transfusion reaction. If you or a relative donates blood, this is often done in anticipation of surgery and is not appropriate for emergency situations. It takes many days to process the donated blood. RISKS AND COMPLICATIONS Although transfusion therapy is very safe and saves many lives, the main dangers of transfusion include:   Getting an infectious disease.  Developing a transfusion reaction. This is an allergic reaction to  something in the blood you were given. Every precaution is taken to prevent this. The decision to have a blood transfusion has been considered carefully by your caregiver before blood is given. Blood is not given unless the benefits outweigh the risks. AFTER THE TRANSFUSION  Right after receiving a blood transfusion, you will usually feel much better and more energetic. This is especially true if your red blood cells have gotten low (anemic). The transfusion raises the level of the red blood cells which carry oxygen, and this usually causes an energy increase.  The nurse administering the transfusion will monitor  you carefully for complications. HOME CARE INSTRUCTIONS  No special instructions are needed after a transfusion. You may find your energy is better. Speak with your caregiver about any limitations on activity for underlying diseases you may have. SEEK MEDICAL CARE IF:   Your condition is not improving after your transfusion.  You develop redness or irritation at the intravenous (IV) site. SEEK IMMEDIATE MEDICAL CARE IF:  Any of the following symptoms occur over the next 12 hours:  Shaking chills.  You have a temperature by mouth above 102 F (38.9 C), not controlled by medicine.  Chest, back, or muscle pain.  People around you feel you are not acting correctly or are confused.  Shortness of breath or difficulty breathing.  Dizziness and fainting.  You get a rash or develop hives.  You have a decrease in urine output.  Your urine turns a dark color or changes to pink, red, or brown. Any of the following symptoms occur over the next 10 days:  You have a temperature by mouth above 102 F (38.9 C), not controlled by medicine.  Shortness of breath.  Weakness after normal activity.  The white part of the eye turns yellow (jaundice).  You have a decrease in the amount of urine or are urinating less often.  Your urine turns a dark color or changes to pink, red, or brown. Document Released: 07/25/2000 Document Revised: 10/20/2011 Document Reviewed: 03/13/2008 [facility] Patient Information 2014 [place], [place].  _______________________________________________________________________

## 2018-09-22 ENCOUNTER — Encounter (HOSPITAL_COMMUNITY): Payer: Self-pay

## 2018-09-22 ENCOUNTER — Other Ambulatory Visit: Payer: Self-pay

## 2018-09-22 ENCOUNTER — Encounter (HOSPITAL_COMMUNITY)
Admission: RE | Admit: 2018-09-22 | Discharge: 2018-09-22 | Disposition: A | Discharge status: Home / Self Care | Payer: Medicare Other | Source: Ambulatory Visit

## 2018-09-22 DIAGNOSIS — Z96611 Presence of right artificial shoulder joint: Secondary | ICD-10-CM | POA: Diagnosis not present

## 2018-09-22 DIAGNOSIS — Z885 Allergy status to narcotic agent status: Secondary | ICD-10-CM | POA: Diagnosis not present

## 2018-09-22 DIAGNOSIS — M1711 Unilateral primary osteoarthritis, right knee: Secondary | ICD-10-CM | POA: Diagnosis not present

## 2018-09-22 DIAGNOSIS — Z7982 Long term (current) use of aspirin: Secondary | ICD-10-CM | POA: Diagnosis not present

## 2018-09-22 DIAGNOSIS — I10 Essential (primary) hypertension: Secondary | ICD-10-CM | POA: Diagnosis not present

## 2018-09-22 DIAGNOSIS — Z87891 Personal history of nicotine dependence: Secondary | ICD-10-CM | POA: Diagnosis not present

## 2018-09-22 DIAGNOSIS — Z881 Allergy status to other antibiotic agents status: Secondary | ICD-10-CM | POA: Diagnosis not present

## 2018-09-22 DIAGNOSIS — Z8249 Family history of ischemic heart disease and other diseases of the circulatory system: Secondary | ICD-10-CM | POA: Diagnosis not present

## 2018-09-22 DIAGNOSIS — E039 Hypothyroidism, unspecified: Secondary | ICD-10-CM | POA: Diagnosis not present

## 2018-09-22 DIAGNOSIS — R262 Difficulty in walking, not elsewhere classified: Secondary | ICD-10-CM | POA: Diagnosis not present

## 2018-09-22 DIAGNOSIS — F329 Major depressive disorder, single episode, unspecified: Secondary | ICD-10-CM | POA: Diagnosis not present

## 2018-09-22 DIAGNOSIS — G4733 Obstructive sleep apnea (adult) (pediatric): Secondary | ICD-10-CM | POA: Diagnosis not present

## 2018-09-22 DIAGNOSIS — I739 Peripheral vascular disease, unspecified: Secondary | ICD-10-CM | POA: Diagnosis not present

## 2018-09-22 DIAGNOSIS — F419 Anxiety disorder, unspecified: Secondary | ICD-10-CM | POA: Diagnosis not present

## 2018-09-22 DIAGNOSIS — Z01812 Encounter for preprocedural laboratory examination: Secondary | ICD-10-CM

## 2018-09-22 DIAGNOSIS — E669 Obesity, unspecified: Secondary | ICD-10-CM | POA: Diagnosis not present

## 2018-09-22 DIAGNOSIS — Z79890 Hormone replacement therapy (postmenopausal): Secondary | ICD-10-CM | POA: Diagnosis not present

## 2018-09-22 DIAGNOSIS — Z6829 Body mass index (BMI) 29.0-29.9, adult: Secondary | ICD-10-CM | POA: Diagnosis not present

## 2018-09-22 DIAGNOSIS — E785 Hyperlipidemia, unspecified: Secondary | ICD-10-CM | POA: Diagnosis not present

## 2018-09-22 DIAGNOSIS — K219 Gastro-esophageal reflux disease without esophagitis: Secondary | ICD-10-CM | POA: Diagnosis not present

## 2018-09-22 DIAGNOSIS — Z79899 Other long term (current) drug therapy: Secondary | ICD-10-CM | POA: Diagnosis not present

## 2018-09-22 LAB — CBC
HCT: 46.1 % (ref 39.0–52.0)
Hemoglobin: 14.5 g/dL (ref 13.0–17.0)
MCH: 30.9 pg (ref 26.0–34.0)
MCHC: 31.5 g/dL (ref 30.0–36.0)
MCV: 98.3 fL (ref 80.0–100.0)
NRBC: 0 % (ref 0.0–0.2)
Platelets: 217 10*3/uL (ref 150–400)
RBC: 4.69 MIL/uL (ref 4.22–5.81)
RDW: 13.2 % (ref 11.5–15.5)
WBC: 6.7 10*3/uL (ref 4.0–10.5)

## 2018-09-22 LAB — URINALYSIS, ROUTINE W REFLEX MICROSCOPIC
BACTERIA UA: NONE SEEN
Bilirubin Urine: NEGATIVE
Glucose, UA: NEGATIVE mg/dL
Hgb urine dipstick: NEGATIVE
Ketones, ur: NEGATIVE mg/dL
Leukocytes,Ua: NEGATIVE
Nitrite: NEGATIVE
Protein, ur: NEGATIVE mg/dL
Specific Gravity, Urine: 1.015 (ref 1.005–1.030)
pH: 6 (ref 5.0–8.0)

## 2018-09-22 LAB — BASIC METABOLIC PANEL
Anion gap: 9 (ref 5–15)
BUN: 16 mg/dL (ref 8–23)
CHLORIDE: 102 mmol/L (ref 98–111)
CO2: 27 mmol/L (ref 22–32)
Calcium: 9.3 mg/dL (ref 8.9–10.3)
Creatinine, Ser: 0.86 mg/dL (ref 0.61–1.24)
GFR calc non Af Amer: >60 mL/min (ref >60)
Glucose, Bld: 91 mg/dL (ref 70–99)
Potassium: 4.4 mmol/L (ref 3.5–5.1)
Sodium: 138 mmol/L (ref 135–145)

## 2018-09-22 LAB — SURGICAL PCR SCREEN
MRSA, PCR: NEGATIVE
Staphylococcus aureus: NEGATIVE

## 2018-09-22 LAB — APTT: aPTT: 30 seconds (ref 24–36)

## 2018-09-22 LAB — PROTIME-INR
INR: 0.89
Prothrombin Time: 12 seconds (ref 11.4–15.2)

## 2018-09-22 LAB — ABO/RH: ABO/RH(D): AB POS

## 2018-09-22 NOTE — Progress Notes (Signed)
PCP: Brunetta Jeans  CARDIOLOGIST:Dr. Manton IN Epic:-09-01-18 Cardiac Clearance on chart from Bernette Mayers  08-18-18 [facility]) Cardiac Clearance in Stress Test Report   08-18-18 (Epic) Stress Test  02-22-18 (Epic) CT Angio Chest Aorta for f/u of Thoracic Aortic Aneurysm  INFO ON CHART:  BLOOD THINNERS AND LAST DOSES: ASA. Stopped on 09/12/18 ____________________________________  PATIENT SYMPTOMS AT TIME OF PREOP:  Hx of Thoracic Aortic Aneurysm, HTN

## 2018-09-23 MED ORDER — BUPIVACAINE LIPOSOME 1.3 % IJ SUSP
20.0000 mL | INTRAMUSCULAR | Status: DC
  Filled 2018-09-23: qty 20

## 2018-09-23 NOTE — Anesthesia Preprocedure Evaluation (Addendum)
Anesthesia Evaluation  Patient identified by MRN, date of birth, ID band Patient awake    Reviewed: Allergy & Precautions, NPO status , Patient's Chart, lab work & pertinent test results  History of Anesthesia Complications (+) PONV  Airway Mallampati: II  TM Distance: >3 FB Neck ROM: Full    Dental   Pulmonary sleep apnea , former smoker,    breath sounds clear to auscultation       Cardiovascular hypertension, Pt. on medications + Peripheral Vascular Disease   Rhythm:Regular Rate:Normal  Stress test 08/18/2018 low risk study. No ischemia. Normal EF.   Neuro/Psych negative neurological ROS     GI/Hepatic Neg liver ROS, GERD  ,  Endo/Other  Hypothyroidism   Renal/GU negative Renal ROS     Musculoskeletal  (+) Arthritis ,   Abdominal   Peds  Hematology negative hematology ROS (+)   Anesthesia Other Findings   Reproductive/Obstetrics                           Lab Results  Component Value Date   WBC 6.7 09/22/2018   HGB 14.5 09/22/2018   HCT 46.1 09/22/2018   MCV 98.3 09/22/2018   PLT 217 09/22/2018   Lab Results  Component Value Date   INR 0.89 09/22/2018   INR 0.93 05/24/2013   Lab Results  Component Value Date   CREATININE 0.86 09/22/2018   BUN 16 09/22/2018   NA 138 09/22/2018   K 4.4 09/22/2018   CL 102 09/22/2018   CO2 27 09/22/2018    Anesthesia Physical Anesthesia Plan  ASA: II  Anesthesia Plan: Spinal and MAC   Post-op Pain Management:  Regional for Post-op pain   Induction: Intravenous  PONV Risk Score and Plan: 3 and Propofol infusion, Ondansetron, Treatment may vary due to age or medical condition, Midazolam and Dexamethasone  Airway Management Planned: Simple Face Mask and Natural Airway  Additional Equipment:   Intra-op Plan:   Post-operative Plan: Extubation in OR  Informed Consent: I have reviewed the patients History and Physical, chart, labs  and discussed the procedure including the risks, benefits and alternatives for the proposed anesthesia with the patient or authorized representative who has indicated his/her understanding and acceptance.     Dental advisory given  Plan Discussed with: CRNA  Anesthesia Plan Comments:       Anesthesia Quick Evaluation

## 2018-09-23 NOTE — Progress Notes (Signed)
Anesthesia Chart Review   Case:  [id number] Date/Time:  09/24/18 0715   Procedure:  TOTAL KNEE ARTHROPLASTY (Right )   Anesthesia type:  Spinal   Pre-op diagnosis:  Right knee osteoarthritis   Location:  [place] [facility]   Surgeon:  Sydnee Cabal, MD      DISCUSSION: 73 yo former smoker (20 pack years, quit 08/11/88) with h/o PONV, GERD, hypothyroidism (s/p radiodiodine therapy), prostate adenocarcinoma (2014), HLD, anxiety, depression, HTN, thoracic aortic aneurysm (stable, mild, followed by Dr. Irish Lack), right knee OA scheduled for above procedure 09/24/18 with Dr. Sydnee Cabal.   Clearance received from Raiford Noble, PA-C on 07/30/18 (on chart) which states pt is moderate risk for surgical procedure due to age greater than 71 yo.  Advised pt to stop ASA 7 days prior to surgery and resume 1-2 days after.   Pt seen by cardiology 08/17/2018.  Seen by [name], NP.  Pt asymptomatic at this visit, stress test ordered for further evaluation due to multiple risk factors prior to giving clearance.  Stress test 08/18/2018 low risk study.  Per [name], NP on results, "normal EF and no ischemia noted.  Ok to proceed with his surgery."  Pt can proceed with planned procedure barring acute status change.  VS: BP (!) 144/84 (BP Location: Right Arm)   Pulse 84   Temp 36.4 C (Oral)   Resp 18   Ht 5\' 10"  (1.778 m)   Wt 90.3 kg   SpO2 97%   BMI 28.55 kg/m   PROVIDERS: Brunetta Jeans, PA-C is PCP   Larae Grooms, MD is Cardiologist  LABS: Labs reviewed: Acceptable for surgery. (all labs ordered are listed, but only abnormal results are displayed)  Labs Reviewed  SURGICAL PCR SCREEN  APTT  BASIC METABOLIC PANEL  CBC  PROTIME-INR  URINALYSIS, ROUTINE W REFLEX MICROSCOPIC  TYPE AND SCREEN  ABO/RH     IMAGES: CT Chest 05/06/18 IMPRESSION: 1. The appearance of the lungs is compatible with interstitial lung disease, and based on the findings this is classified as ATS  UIP (usual interstitial pneumonia). 2. Aortic atherosclerosis, in addition to left main and 3 vessel coronary artery disease. Assessment for potential risk factor modification, dietary therapy or pharmacologic therapy may be warranted, if clinically indicated. 3. There are calcifications of the aortic valve. Echocardiographic correlation for evaluation of potential valvular dysfunction may be warranted if clinically indicated. 4. Cholelithiasis.   EKG: 08/17/2018 Rate 90 bpm Normal sinus rhythm Possible left atrial enlargement Inferior infarct, age undetermined Abnormal ECG  CV: Stress Test 08/18/2018  Nuclear stress EF: 57%.  There was no ST segment deviation noted during stress.  The study is normal.  This is a low risk study.  The left ventricular ejection fraction is normal (55-65%).   Normal resting and stress perfusion. No ischemia or infarction EF 57%  Echo 01/30/16 Study Conclusions  - Left ventricle: The cavity size was normal. There was moderate   concentric hypertrophy. Systolic function was normal. The   estimated ejection fraction was in the range of 60% to 65%. Wall   motion was normal; there were no regional wall motion   abnormalities. Doppler parameters are consistent with abnormal   left ventricular relaxation (grade 1 diastolic dysfunction).   Doppler parameters are consistent with indeterminate ventricular   filling pressure. - Aortic valve: Transvalvular velocity was within the normal range.   There was no stenosis. There was mild regurgitation.   Regurgitation pressure half-time: 586 ms. -  Mitral valve: Transvalvular velocity was within the normal range.   There was no evidence for stenosis. There was no regurgitation. - Right ventricle: The cavity size was normal. Wall thickness was   normal. Systolic function was normal. - Tricuspid valve: There was mild regurgitation. - Pulmonary arteries: Systolic pressure was within the normal    range. PA peak pressure: 29 mm Hg (S). Past Medical History:  Diagnosis Date  . Adenocarcinoma of prostate ([place]) 04/16/2013  . Aneurysm, aorta, thoracic ([place])   . Anxiety   . Arthritis    "knees" (05/21/2017)  . Depression   . Diverticulosis   . Dyspnea    on exertion for a long period time  . GERD (gastroesophageal reflux disease)   . Hyperlipidemia   . Hypertension   . Hypothyroidism, postradioiodine therapy    1980's  . Nocturia   . Organic impotence   . OSA (obstructive sleep apnea)    NO CPAP--  S/P SURGERY  2002  . Osteoporosis   . Pneumonia 1970  . PONV (postoperative nausea and vomiting)   . Urge urinary incontinence   . Wears contact lenses     Past Surgical History:  Procedure Laterality Date  . HEMORRHOIDECTOMY WITH HEMORRHOID BANDING  2007  . INGUINAL HERNIA REPAIR Bilateral    x4  (2 each side)  . KNEE ARTHROSCOPY Right 2012  . NASAL SEPTUM SURGERY  1982   w/ rhinoplasty  . PROSTATE BIOPSY    . RADIOACTIVE SEED IMPLANT N/A 05/30/2013   Procedure: RADIOACTIVE SEED IMPLANT;  Surgeon: Hanley Ben, MD;  Location: [place];  Service: Urology;  Laterality: N/A;  . REVERSE SHOULDER ARTHROPLASTY Right 05/21/2017  . REVERSE SHOULDER ARTHROPLASTY Right 05/21/2017  . REVERSE SHOULDER ARTHROPLASTY Right 05/21/2017   Procedure: REVERSE RIGHT SHOULDER ARTHROPLASTY;  Surgeon: Justice Britain, MD;  Location: [place];  Service: Orthopedics;  Laterality: Right;  . RHINOPLASTY  1982   w/w/ septoplasty  . SHOULDER ARTHROSCOPY W/ ROTATOR CUFF REPAIR Right 2004  . SHOULDER ARTHROSCOPY W/ ROTATOR CUFF REPAIR Left 2016  . TONSILLECTOMY  AS CHILD  . UVULOPALATOPHARYNGOPLASTY  2002    MEDICATIONS: . amLODipine (NORVASC) 2.5 MG tablet  . ARIPiprazole (ABILIFY) 10 MG tablet  . aspirin 81 MG tablet  . atorvastatin (LIPITOR) 40 MG tablet  . busPIRone (BUSPAR) 15 MG tablet  . gabapentin (NEURONTIN) 800 MG tablet  . levothyroxine (SYNTHROID, LEVOTHROID) 112 MCG  tablet  . losartan (COZAAR) 100 MG tablet  . Multiple Vitamin (MULTIVITAMIN WITH MINERALS) TABS tablet  . omeprazole (PRILOSEC) 40 MG capsule  . oxymetazoline (ANEFRIN NASAL SPRAY) 0.05 % nasal spray  . tamsulosin (FLOMAX) 0.4 MG CAPS capsule  . venlafaxine XR (EFFEXOR-XR) 150 MG 24 hr capsule   No current facility-administered medications for this encounter.     Maia Plan [facility] Pre-Surgical Testing [phone] 09/23/18 10:49 AM

## 2018-09-24 ENCOUNTER — Other Ambulatory Visit: Payer: Self-pay

## 2018-09-24 ENCOUNTER — Inpatient Hospital Stay (HOSPITAL_COMMUNITY): Payer: Medicare Other | Admitting: Certified Registered Nurse Anesthetist

## 2018-09-24 ENCOUNTER — Ambulatory Visit (HOSPITAL_COMMUNITY)
Admission: RE | Admit: 2018-09-24 | Discharge: 2018-09-26 | Disposition: A | Discharge status: Home / Self Care | Payer: Medicare Other | Source: Ambulatory Visit | Attending: Specialist | Admitting: Specialist

## 2018-09-24 ENCOUNTER — Encounter (HOSPITAL_COMMUNITY): Payer: Self-pay | Admitting: Emergency Medicine

## 2018-09-24 ENCOUNTER — Encounter (HOSPITAL_COMMUNITY)
Admission: RE | Disposition: A | Discharge status: Home / Self Care | Payer: Self-pay | Source: Ambulatory Visit | Attending: Specialist

## 2018-09-24 ENCOUNTER — Inpatient Hospital Stay (HOSPITAL_COMMUNITY): Payer: Medicare Other | Admitting: Physician Assistant

## 2018-09-24 DIAGNOSIS — Z885 Allergy status to narcotic agent status: Secondary | ICD-10-CM | POA: Insufficient documentation

## 2018-09-24 DIAGNOSIS — G4733 Obstructive sleep apnea (adult) (pediatric): Secondary | ICD-10-CM | POA: Diagnosis not present

## 2018-09-24 DIAGNOSIS — F419 Anxiety disorder, unspecified: Secondary | ICD-10-CM | POA: Insufficient documentation

## 2018-09-24 DIAGNOSIS — Z96611 Presence of right artificial shoulder joint: Secondary | ICD-10-CM | POA: Insufficient documentation

## 2018-09-24 DIAGNOSIS — I10 Essential (primary) hypertension: Secondary | ICD-10-CM | POA: Insufficient documentation

## 2018-09-24 DIAGNOSIS — M1711 Unilateral primary osteoarthritis, right knee: Principal | ICD-10-CM | POA: Insufficient documentation

## 2018-09-24 DIAGNOSIS — F329 Major depressive disorder, single episode, unspecified: Secondary | ICD-10-CM | POA: Diagnosis not present

## 2018-09-24 DIAGNOSIS — Z7982 Long term (current) use of aspirin: Secondary | ICD-10-CM | POA: Insufficient documentation

## 2018-09-24 DIAGNOSIS — R262 Difficulty in walking, not elsewhere classified: Secondary | ICD-10-CM | POA: Insufficient documentation

## 2018-09-24 DIAGNOSIS — I739 Peripheral vascular disease, unspecified: Secondary | ICD-10-CM | POA: Insufficient documentation

## 2018-09-24 DIAGNOSIS — E039 Hypothyroidism, unspecified: Secondary | ICD-10-CM | POA: Diagnosis not present

## 2018-09-24 DIAGNOSIS — E785 Hyperlipidemia, unspecified: Secondary | ICD-10-CM | POA: Insufficient documentation

## 2018-09-24 DIAGNOSIS — Z881 Allergy status to other antibiotic agents status: Secondary | ICD-10-CM | POA: Insufficient documentation

## 2018-09-24 DIAGNOSIS — Z79899 Other long term (current) drug therapy: Secondary | ICD-10-CM | POA: Insufficient documentation

## 2018-09-24 DIAGNOSIS — Z87891 Personal history of nicotine dependence: Secondary | ICD-10-CM | POA: Insufficient documentation

## 2018-09-24 DIAGNOSIS — E669 Obesity, unspecified: Secondary | ICD-10-CM | POA: Insufficient documentation

## 2018-09-24 DIAGNOSIS — G8918 Other acute postprocedural pain: Secondary | ICD-10-CM | POA: Diagnosis not present

## 2018-09-24 DIAGNOSIS — Z6829 Body mass index (BMI) 29.0-29.9, adult: Secondary | ICD-10-CM | POA: Diagnosis not present

## 2018-09-24 DIAGNOSIS — Z8249 Family history of ischemic heart disease and other diseases of the circulatory system: Secondary | ICD-10-CM | POA: Insufficient documentation

## 2018-09-24 DIAGNOSIS — K219 Gastro-esophageal reflux disease without esophagitis: Secondary | ICD-10-CM | POA: Diagnosis not present

## 2018-09-24 DIAGNOSIS — Z79890 Hormone replacement therapy (postmenopausal): Secondary | ICD-10-CM | POA: Insufficient documentation

## 2018-09-24 DIAGNOSIS — Z96659 Presence of unspecified artificial knee joint: Secondary | ICD-10-CM

## 2018-09-24 HISTORY — PX: TOTAL KNEE ARTHROPLASTY: SHX125

## 2018-09-24 LAB — TYPE AND SCREEN
ABO/RH(D): AB POS
ANTIBODY SCREEN: NEGATIVE

## 2018-09-24 SURGERY — ARTHROPLASTY, KNEE, TOTAL
Anesthesia: Monitor Anesthesia Care | Site: Knee | Laterality: Right

## 2018-09-24 MED ORDER — SODIUM CHLORIDE (PF) 0.9 % IJ SOLN
INTRAMUSCULAR | Status: AC
  Filled 2018-09-24: qty 50

## 2018-09-24 MED ORDER — KETOROLAC TROMETHAMINE 30 MG/ML IJ SOLN
INTRAMUSCULAR | Status: DC | PRN
  Administered 2018-09-24: 30 mg

## 2018-09-24 MED ORDER — SODIUM CHLORIDE 0.9 % IV SOLN
INTRAVENOUS | Status: DC
  Administered 2018-09-24: 12:00:00 via INTRAVENOUS

## 2018-09-24 MED ORDER — ARIPIPRAZOLE 5 MG PO TABS
5.0000 mg | ORAL_TABLET | Freq: Every day | ORAL | Status: DC
  Administered 2018-09-25 – 2018-09-26 (×2): 5 mg via ORAL
  Filled 2018-09-24 (×2): qty 1

## 2018-09-24 MED ORDER — BUSPIRONE HCL 5 MG PO TABS
15.0000 mg | ORAL_TABLET | Freq: Two times a day (BID) | ORAL | Status: DC
  Administered 2018-09-24 – 2018-09-26 (×4): 15 mg via ORAL
  Filled 2018-09-24 (×4): qty 1

## 2018-09-24 MED ORDER — BUPIVACAINE IN DEXTROSE 0.75-8.25 % IT SOLN
INTRATHECAL | Status: DC | PRN
  Administered 2018-09-24: 1.8 mL via INTRATHECAL

## 2018-09-24 MED ORDER — BUPIVACAINE HCL (PF) 0.25 % IJ SOLN
INTRAMUSCULAR | Status: AC
  Filled 2018-09-24: qty 30

## 2018-09-24 MED ORDER — ALUM & MAG HYDROXIDE-SIMETH 200-200-20 MG/5ML PO SUSP: 30.0000 mL | ORAL | Status: DC | PRN

## 2018-09-24 MED ORDER — ONDANSETRON HCL 4 MG/2ML IJ SOLN: 4.0000 mg | Freq: Four times a day (QID) | INTRAMUSCULAR | Status: DC | PRN

## 2018-09-24 MED ORDER — PHENYLEPHRINE 40 MCG/ML (10ML) SYRINGE FOR IV PUSH (FOR BLOOD PRESSURE SUPPORT)
PREFILLED_SYRINGE | INTRAVENOUS | Status: AC
  Filled 2018-09-24: qty 10

## 2018-09-24 MED ORDER — METOCLOPRAMIDE HCL 5 MG PO TABS: 5.0000 mg | ORAL_TABLET | Freq: Three times a day (TID) | ORAL | Status: DC | PRN

## 2018-09-24 MED ORDER — CLONIDINE HCL (ANALGESIA) 100 MCG/ML EP SOLN
EPIDURAL | Status: DC | PRN
  Administered 2018-09-24: 50 ug

## 2018-09-24 MED ORDER — DEXAMETHASONE SODIUM PHOSPHATE 10 MG/ML IJ SOLN
10.0000 mg | Freq: Once | INTRAMUSCULAR | Status: AC
  Administered 2018-09-25: 10 mg via INTRAVENOUS
  Filled 2018-09-24: qty 1

## 2018-09-24 MED ORDER — DEXAMETHASONE SODIUM PHOSPHATE 10 MG/ML IJ SOLN
INTRAMUSCULAR | Status: AC
  Filled 2018-09-24: qty 1

## 2018-09-24 MED ORDER — OXYCODONE HCL 5 MG PO TABS
5.0000 mg | ORAL_TABLET | ORAL | Status: DC | PRN
  Administered 2018-09-24 (×2): 10 mg via ORAL
  Administered 2018-09-24: 5 mg via ORAL
  Filled 2018-09-24: qty 2
  Filled 2018-09-24 (×2): qty 1
  Filled 2018-09-24: qty 2

## 2018-09-24 MED ORDER — PROPOFOL 10 MG/ML IV BOLUS
INTRAVENOUS | Status: DC | PRN
  Administered 2018-09-24 (×4): 10 mg via INTRAVENOUS

## 2018-09-24 MED ORDER — LOSARTAN POTASSIUM 50 MG PO TABS
100.0000 mg | ORAL_TABLET | Freq: Every day | ORAL | Status: DC
  Administered 2018-09-25 – 2018-09-26 (×2): 100 mg via ORAL
  Filled 2018-09-24 (×3): qty 2

## 2018-09-24 MED ORDER — ONDANSETRON HCL 4 MG PO TABS: 4.0000 mg | ORAL_TABLET | Freq: Four times a day (QID) | ORAL | Status: DC | PRN

## 2018-09-24 MED ORDER — CEFAZOLIN SODIUM-DEXTROSE 2-4 GM/100ML-% IV SOLN
2.0000 g | INTRAVENOUS | Status: AC
  Administered 2018-09-24: 2 g via INTRAVENOUS
  Filled 2018-09-24: qty 100

## 2018-09-24 MED ORDER — METOCLOPRAMIDE HCL 5 MG/ML IJ SOLN: 5.0000 mg | Freq: Three times a day (TID) | INTRAMUSCULAR | Status: DC | PRN

## 2018-09-24 MED ORDER — POLYETHYLENE GLYCOL 3350 17 G PO PACK: 17.0000 g | PACK | Freq: Every day | ORAL | Status: DC | PRN

## 2018-09-24 MED ORDER — CHLORHEXIDINE GLUCONATE 4 % EX LIQD: 60.0000 mL | Freq: Once | CUTANEOUS | Status: DC

## 2018-09-24 MED ORDER — ATORVASTATIN CALCIUM 40 MG PO TABS
40.0000 mg | ORAL_TABLET | Freq: Every evening | ORAL | Status: DC
  Administered 2018-09-24 – 2018-09-25 (×2): 40 mg via ORAL
  Filled 2018-09-24 (×2): qty 1

## 2018-09-24 MED ORDER — PHENYLEPHRINE HCL 10 MG/ML IJ SOLN
INTRAMUSCULAR | Status: AC
  Filled 2018-09-24: qty 1

## 2018-09-24 MED ORDER — FENTANYL CITRATE (PF) 100 MCG/2ML IJ SOLN
INTRAMUSCULAR | Status: DC | PRN
  Administered 2018-09-24 (×2): 50 ug via INTRAVENOUS

## 2018-09-24 MED ORDER — BUPIVACAINE-EPINEPHRINE (PF) 0.5% -1:200000 IJ SOLN
INTRAMUSCULAR | Status: DC | PRN
  Administered 2018-09-24: 20 mL via PERINEURAL

## 2018-09-24 MED ORDER — ACETAMINOPHEN 325 MG PO TABS
325.0000 mg | ORAL_TABLET | Freq: Four times a day (QID) | ORAL | Status: DC | PRN
  Administered 2018-09-25 – 2018-09-26 (×3): 650 mg via ORAL
  Filled 2018-09-24 (×4): qty 2

## 2018-09-24 MED ORDER — VENLAFAXINE HCL ER 150 MG PO CP24
300.0000 mg | ORAL_CAPSULE | Freq: Every day | ORAL | Status: DC
  Administered 2018-09-25 – 2018-09-26 (×2): 300 mg via ORAL
  Filled 2018-09-24 (×2): qty 2

## 2018-09-24 MED ORDER — FERROUS SULFATE 325 (65 FE) MG PO TABS
325.0000 mg | ORAL_TABLET | Freq: Three times a day (TID) | ORAL | Status: DC
  Administered 2018-09-25 – 2018-09-26 (×4): 325 mg via ORAL
  Filled 2018-09-24 (×4): qty 1

## 2018-09-24 MED ORDER — HYDROMORPHONE HCL 1 MG/ML IJ SOLN: 0.2500 mg | INTRAMUSCULAR | Status: DC | PRN

## 2018-09-24 MED ORDER — OXYCODONE HCL 5 MG PO TABS: 5.0000 mg | ORAL_TABLET | ORAL | 0 refills | Status: AC | PRN

## 2018-09-24 MED ORDER — METHOCARBAMOL 500 MG PO TABS
500.0000 mg | ORAL_TABLET | Freq: Four times a day (QID) | ORAL | Status: DC | PRN
  Administered 2018-09-24 – 2018-09-25 (×2): 500 mg via ORAL
  Filled 2018-09-24 (×3): qty 1

## 2018-09-24 MED ORDER — METHOCARBAMOL 500 MG IVPB - SIMPLE MED
INTRAVENOUS | Status: AC
  Administered 2018-09-24: 500 mg
  Filled 2018-09-24: qty 50

## 2018-09-24 MED ORDER — 0.9 % SODIUM CHLORIDE (POUR BTL) OPTIME
TOPICAL | Status: DC | PRN
  Administered 2018-09-24: 1000 mL

## 2018-09-24 MED ORDER — DIPHENHYDRAMINE HCL 12.5 MG/5ML PO ELIX
12.5000 mg | ORAL_SOLUTION | ORAL | Status: DC | PRN
  Administered 2018-09-24 – 2018-09-25 (×2): 25 mg via ORAL
  Filled 2018-09-24 (×2): qty 10

## 2018-09-24 MED ORDER — PROPOFOL 10 MG/ML IV BOLUS
INTRAVENOUS | Status: AC
  Filled 2018-09-24: qty 20

## 2018-09-24 MED ORDER — AMLODIPINE BESYLATE 2.5 MG PO TABS
2.5000 mg | ORAL_TABLET | Freq: Every day | ORAL | Status: DC
  Administered 2018-09-25 – 2018-09-26 (×2): 2.5 mg via ORAL
  Filled 2018-09-24 (×2): qty 1

## 2018-09-24 MED ORDER — HYDROMORPHONE HCL 1 MG/ML IJ SOLN: 0.5000 mg | INTRAMUSCULAR | Status: DC | PRN

## 2018-09-24 MED ORDER — TRANEXAMIC ACID-NACL 1000-0.7 MG/100ML-% IV SOLN
INTRAVENOUS | Status: AC
  Filled 2018-09-24: qty 100

## 2018-09-24 MED ORDER — MAGNESIUM CITRATE PO SOLN: 1.0000 | Freq: Once | ORAL | Status: DC | PRN

## 2018-09-24 MED ORDER — ONDANSETRON HCL 4 MG/2ML IJ SOLN
INTRAMUSCULAR | Status: DC | PRN
  Administered 2018-09-24: 4 mg via INTRAVENOUS

## 2018-09-24 MED ORDER — SODIUM CHLORIDE 0.9 % IR SOLN
Status: DC | PRN
  Administered 2018-09-24: 1000 mL

## 2018-09-24 MED ORDER — METHOCARBAMOL 500 MG PO TABS: 500.0000 mg | ORAL_TABLET | Freq: Three times a day (TID) | ORAL | 1 refills | Status: DC | PRN

## 2018-09-24 MED ORDER — METHOCARBAMOL 500 MG IVPB - SIMPLE MED
500.0000 mg | Freq: Four times a day (QID) | INTRAVENOUS | Status: DC | PRN
  Filled 2018-09-24: qty 50

## 2018-09-24 MED ORDER — SODIUM CHLORIDE (PF) 0.9 % IJ SOLN
INTRAMUSCULAR | Status: DC | PRN
  Administered 2018-09-24: 29 mL

## 2018-09-24 MED ORDER — PANTOPRAZOLE SODIUM 40 MG PO TBEC
40.0000 mg | DELAYED_RELEASE_TABLET | Freq: Every day | ORAL | Status: DC
  Administered 2018-09-25 – 2018-09-26 (×2): 40 mg via ORAL
  Filled 2018-09-24 (×2): qty 1

## 2018-09-24 MED ORDER — BISACODYL 5 MG PO TBEC: 5.0000 mg | DELAYED_RELEASE_TABLET | Freq: Every day | ORAL | Status: DC | PRN

## 2018-09-24 MED ORDER — KETOROLAC TROMETHAMINE 30 MG/ML IJ SOLN
INTRAMUSCULAR | Status: AC
  Filled 2018-09-24: qty 1

## 2018-09-24 MED ORDER — LIDOCAINE 2% (20 MG/ML) 5 ML SYRINGE
INTRAMUSCULAR | Status: AC
  Filled 2018-09-24: qty 5

## 2018-09-24 MED ORDER — PROPOFOL 500 MG/50ML IV EMUL
INTRAVENOUS | Status: DC | PRN
  Administered 2018-09-24: 100 ug/kg/min via INTRAVENOUS

## 2018-09-24 MED ORDER — ENOXAPARIN SODIUM 30 MG/0.3ML ~~LOC~~ SOLN
30.0000 mg | Freq: Two times a day (BID) | SUBCUTANEOUS | Status: DC
  Administered 2018-09-25 – 2018-09-26 (×3): 30 mg via SUBCUTANEOUS
  Filled 2018-09-24 (×3): qty 0.3

## 2018-09-24 MED ORDER — PHENYLEPHRINE HCL 10 MG/ML IJ SOLN
INTRAVENOUS | Status: DC | PRN
  Administered 2018-09-24: 25 ug/min via INTRAVENOUS

## 2018-09-24 MED ORDER — PROPOFOL 10 MG/ML IV BOLUS
INTRAVENOUS | Status: AC
  Filled 2018-09-24: qty 60

## 2018-09-24 MED ORDER — FENTANYL CITRATE (PF) 100 MCG/2ML IJ SOLN
INTRAMUSCULAR | Status: AC
  Filled 2018-09-24: qty 2

## 2018-09-24 MED ORDER — ONDANSETRON HCL 4 MG/2ML IJ SOLN
INTRAMUSCULAR | Status: AC
  Filled 2018-09-24: qty 2

## 2018-09-24 MED ORDER — MENTHOL 3 MG MT LOZG: 1.0000 | LOZENGE | OROMUCOSAL | Status: DC | PRN

## 2018-09-24 MED ORDER — DOCUSATE SODIUM 100 MG PO CAPS
100.0000 mg | ORAL_CAPSULE | Freq: Two times a day (BID) | ORAL | Status: DC
  Administered 2018-09-24 – 2018-09-26 (×4): 100 mg via ORAL
  Filled 2018-09-24 (×4): qty 1

## 2018-09-24 MED ORDER — OXYCODONE HCL 5 MG PO TABS: 10.0000 mg | ORAL_TABLET | ORAL | Status: DC | PRN

## 2018-09-24 MED ORDER — PHENOL 1.4 % MT LIQD: 1.0000 | OROMUCOSAL | Status: DC | PRN

## 2018-09-24 MED ORDER — ASPIRIN EC 325 MG PO TBEC: 325.0000 mg | DELAYED_RELEASE_TABLET | Freq: Two times a day (BID) | ORAL | 11 refills | Status: AC

## 2018-09-24 MED ORDER — ACETAMINOPHEN 500 MG PO TABS
1000.0000 mg | ORAL_TABLET | Freq: Four times a day (QID) | ORAL | Status: AC
  Administered 2018-09-24 – 2018-09-25 (×4): 1000 mg via ORAL
  Filled 2018-09-24 (×3): qty 2

## 2018-09-24 MED ORDER — CEFAZOLIN SODIUM-DEXTROSE 2-4 GM/100ML-% IV SOLN
2.0000 g | Freq: Four times a day (QID) | INTRAVENOUS | Status: AC
  Administered 2018-09-24 (×2): 2 g via INTRAVENOUS
  Filled 2018-09-24 (×2): qty 100

## 2018-09-24 MED ORDER — GABAPENTIN 400 MG PO CAPS
800.0000 mg | ORAL_CAPSULE | Freq: Every day | ORAL | Status: DC
  Administered 2018-09-24 – 2018-09-25 (×2): 800 mg via ORAL
  Filled 2018-09-24 (×2): qty 2

## 2018-09-24 MED ORDER — STERILE WATER FOR IRRIGATION IR SOLN
Status: DC | PRN
  Administered 2018-09-24: 2000 mL

## 2018-09-24 MED ORDER — DEXAMETHASONE SODIUM PHOSPHATE 10 MG/ML IJ SOLN
10.0000 mg | Freq: Once | INTRAMUSCULAR | Status: AC
  Administered 2018-09-24: 10 mg via INTRAVENOUS

## 2018-09-24 MED ORDER — LACTATED RINGERS IV SOLN
INTRAVENOUS | Status: DC
  Administered 2018-09-24 (×2): via INTRAVENOUS

## 2018-09-24 MED ORDER — PHENYLEPHRINE 40 MCG/ML (10ML) SYRINGE FOR IV PUSH (FOR BLOOD PRESSURE SUPPORT)
PREFILLED_SYRINGE | INTRAVENOUS | Status: DC | PRN
  Administered 2018-09-24 (×2): 80 ug via INTRAVENOUS

## 2018-09-24 MED ORDER — BUPIVACAINE HCL (PF) 0.25 % IJ SOLN
INTRAMUSCULAR | Status: DC | PRN
  Administered 2018-09-24: 30 mL

## 2018-09-24 MED ORDER — TAMSULOSIN HCL 0.4 MG PO CAPS
0.4000 mg | ORAL_CAPSULE | Freq: Every evening | ORAL | Status: DC
  Administered 2018-09-24 – 2018-09-25 (×2): 0.4 mg via ORAL
  Filled 2018-09-24 (×2): qty 1

## 2018-09-24 SURGICAL SUPPLY — 69 items
ATTUNE MED DOME PAT 41 KNEE (Knees) ×2 IMPLANT
ATTUNE PS FEM RT SZ 5 CEM KNEE (Femur) ×2 IMPLANT
ATTUNE PSRP INSR SZ5 5 KNEE (Insert) ×2 IMPLANT
BAG ZIPLOCK 12X15 (MISCELLANEOUS) ×4 IMPLANT
BANDAGE ACE 4X5 VEL STRL LF (GAUZE/BANDAGES/DRESSINGS) ×2 IMPLANT
BANDAGE ACE 6X5 VEL STRL LF (GAUZE/BANDAGES/DRESSINGS) ×2 IMPLANT
BASE TIBIAL ROT PLAT SZ 7 KNEE (Knees) ×1 IMPLANT
BLADE SAG 18X100X1.27 (BLADE) ×2 IMPLANT
BLADE SAW SGTL 11.0X1.19X90.0M (BLADE) ×2 IMPLANT
BLADE SURG SZ10 CARB STEEL (BLADE) ×4 IMPLANT
BOWL SMART MIX CTS (DISPOSABLE) ×2 IMPLANT
CEMENT HV SMART SET (Cement) ×4 IMPLANT
COVER SURGICAL LIGHT HANDLE (MISCELLANEOUS) ×2 IMPLANT
CUFF TOURN SGL QUICK 34 (TOURNIQUET CUFF) ×1
CUFF TRNQT CYL 34X4X40X1 (TOURNIQUET CUFF) ×1 IMPLANT
DECANTER SPIKE VIAL GLASS SM (MISCELLANEOUS) ×2 IMPLANT
DERMABOND ADVANCED (GAUZE/BANDAGES/DRESSINGS) ×1
DERMABOND ADVANCED .7 DNX12 (GAUZE/BANDAGES/DRESSINGS) ×1 IMPLANT
DRAPE U-SHAPE 47X51 STRL (DRAPES) ×2 IMPLANT
DRESSING AQUACEL AG SP 3.5X10 (GAUZE/BANDAGES/DRESSINGS) ×1 IMPLANT
DRSG AQUACEL AG SP 3.5X10 (GAUZE/BANDAGES/DRESSINGS) ×2
DRSG TEGADERM 4X4.75 (GAUZE/BANDAGES/DRESSINGS) ×2 IMPLANT
DURAPREP 26ML APPLICATOR (WOUND CARE) ×4 IMPLANT
ELECT PENCIL ROCKER SW 15FT (MISCELLANEOUS) ×2 IMPLANT
ELECT REM PT RETURN 15FT ADLT (MISCELLANEOUS) ×2 IMPLANT
EVACUATOR 1/8 PVC DRAIN (DRAIN) ×2 IMPLANT
GAUZE SPONGE 2X2 8PLY STRL LF (GAUZE/BANDAGES/DRESSINGS) ×1 IMPLANT
GLOVE BIO SURGEON STRL SZ7.5 (GLOVE) ×4 IMPLANT
GLOVE BIOGEL PI IND STRL 6.5 (GLOVE) ×1 IMPLANT
GLOVE BIOGEL PI IND STRL 7.5 (GLOVE) ×3 IMPLANT
GLOVE BIOGEL PI IND STRL 8 (GLOVE) ×2 IMPLANT
GLOVE BIOGEL PI INDICATOR 6.5 (GLOVE) ×1
GLOVE BIOGEL PI INDICATOR 7.5 (GLOVE) ×3
GLOVE BIOGEL PI INDICATOR 8 (GLOVE) ×2
GLOVE ECLIPSE 6.5 STRL STRAW (GLOVE) ×2 IMPLANT
GLOVE ECLIPSE 8.0 STRL XLNG CF (GLOVE) ×4 IMPLANT
GLOVE SURG ORTHO 9.0 STRL STRW (GLOVE) ×2 IMPLANT
GLOVE SURG SS PI 7.5 STRL IVOR (GLOVE) ×2 IMPLANT
GOWN SPEC L3 XXLG W/TWL (GOWN DISPOSABLE) ×2 IMPLANT
GOWN STRL REUS W/TWL LRG LVL3 (GOWN DISPOSABLE) ×2 IMPLANT
GOWN STRL REUS W/TWL XL LVL3 (GOWN DISPOSABLE) ×4 IMPLANT
HANDPIECE INTERPULSE COAX TIP (DISPOSABLE) ×1
HOLDER FOLEY CATH W/STRAP (MISCELLANEOUS) ×2 IMPLANT
NDL SAFETY ECLIPSE 18X1.5 (NEEDLE) ×1 IMPLANT
NEEDLE HYPO 18GX1.5 SHARP (NEEDLE) ×1
PACK TOTAL KNEE CUSTOM (KITS) ×2 IMPLANT
PIN STEINMAN FIXATION KNEE (PIN) ×2 IMPLANT
PIN THREADED HEADED SIGMA (PIN) ×2 IMPLANT
PROTECTOR NERVE ULNAR (MISCELLANEOUS) ×2 IMPLANT
SET HNDPC FAN SPRY TIP SCT (DISPOSABLE) ×1 IMPLANT
SET PAD KNEE POSITIONER (MISCELLANEOUS) ×2 IMPLANT
SPONGE GAUZE 2X2 STER 10/PKG (GAUZE/BANDAGES/DRESSINGS) ×1
SPONGE SURGIFOAM ABS GEL 100 (HEMOSTASIS) ×2 IMPLANT
STOCKINETTE 6  STRL (DRAPES) ×1
STOCKINETTE 6 STRL (DRAPES) ×1 IMPLANT
SUT BONE WAX W31G (SUTURE) IMPLANT
SUT MNCRL AB 3-0 PS2 18 (SUTURE) ×2 IMPLANT
SUT VIC AB 1 CT1 27 (SUTURE) ×4
SUT VIC AB 1 CT1 27XBRD ANTBC (SUTURE) ×4 IMPLANT
SUT VIC AB 2-0 CT1 27 (SUTURE) ×2
SUT VIC AB 2-0 CT1 TAPERPNT 27 (SUTURE) ×2 IMPLANT
SUT VLOC 180 0 24IN GS25 (SUTURE) ×2 IMPLANT
SYR 3ML LL SCALE MARK (SYRINGE) ×2 IMPLANT
SYR 50ML LL SCALE MARK (SYRINGE) ×2 IMPLANT
TAPE STRIPS DRAPE STRL (GAUZE/BANDAGES/DRESSINGS) ×2 IMPLANT
TIBIAL BASE ROT PLAT SZ 7 KNEE (Knees) ×2 IMPLANT
TRAY FOLEY MTR SLVR 16FR STAT (SET/KITS/TRAYS/PACK) ×2 IMPLANT
WRAP KNEE MAXI GEL POST OP (GAUZE/BANDAGES/DRESSINGS) ×2 IMPLANT
YANKAUER SUCT BULB TIP 10FT TU (MISCELLANEOUS) ×2 IMPLANT

## 2018-09-24 NOTE — Interval H&P Note (Signed)
History and Physical Interval Note:  09/24/2018 7:37 AM  Bryan Tyler  has presented today for surgery, with the diagnosis of Right knee osteoarthritis  The various methods of treatment have been discussed with the patient and family. After consideration of risks, benefits and other options for treatment, the patient has consented to  Procedure(s) with comments: TOTAL KNEE ARTHROPLASTY (Right) - Adductor Block as a surgical intervention .  The patient's history has been reviewed, patient examined, no change in status, stable for surgery.  I have reviewed the patient's chart and labs.  Questions were answered to the patient's satisfaction.     Donny Heffern ANDREW

## 2018-09-24 NOTE — Anesthesia Procedure Notes (Addendum)
Spinal  Patient location during procedure: OR Start time: 09/24/2018 7:45 AM End time: 09/24/2018 7:52 AM Staffing Anesthesiologist: Suzette Battiest, MD Preanesthetic Checklist Completed: patient identified, site marked, surgical consent, pre-op evaluation, timeout performed, IV checked, risks and benefits discussed and monitors and equipment checked Spinal Block Patient position: sitting Prep: ChloraPrep and site prepped and draped Patient monitoring: blood pressure, continuous pulse ox and heart rate Approach: right paramedian Location: L4-5 Injection technique: single-shot Needle Needle type: Quincke  Needle gauge: 22 G Needle length: 9 cm Additional Notes x3 attempts midline unsuccessful. Right paramedian approach with free flow CSF. Needle rotated 90degrees after unable to aspirate. Free flow CSF present again and LA injected.

## 2018-09-24 NOTE — Anesthesia Postprocedure Evaluation (Addendum)
Anesthesia Post Note  Patient: Bryan Tyler  Procedure(s) Performed: TOTAL KNEE ARTHROPLASTY (Right Knee)     Patient location during evaluation: PACU Anesthesia Type: Spinal Level of consciousness: awake and alert Pain management: pain level controlled Vital Signs Assessment: post-procedure vital signs reviewed and stable Respiratory status: spontaneous breathing and respiratory function stable Cardiovascular status: blood pressure returned to baseline and stable Postop Assessment: spinal receding Anesthetic complications: no    Last Vitals:  Vitals:   09/24/18 1145 09/24/18 1254  BP: (!) 118/91 117/83  Pulse: 76 79  Resp: 16 16  Temp: 36.5 C (!) 36.3 C  SpO2: 100% 100%    Last Pain:  Vitals:   09/24/18 1254  TempSrc: Oral  PainSc:                  [name]

## 2018-09-24 NOTE — Op Note (Signed)
DATE OF SURGERY:  09/24/2018  TIME: 9:44 AM  PATIENT NAME:  Bryan Tyler    AGE: 73 y.o.   PRE-OPERATIVE DIAGNOSIS:  Right knee osteoarthritis  POST-OPERATIVE DIAGNOSIS:  Right knee osteoarthritis  PROCEDURE:  Procedure(s): TOTAL KNEE ARTHROPLASTY  SURGEON:  Timathy Newberry ANDREW  ASSISTANT:  Bryson Stilwell, PA-C, present and scrubbed throughout the case, critical for assistance with exposure, retraction, instrumentation, and closure.  OPERATIVE IMPLANTS: Depuy PFC Attune Rotating Platform.  Femur size 5, Tibia size 7, Patella size 41 3-peg oval button, with a 5 mm polyethylene insert.   PREOPERATIVE INDICATIONS:   Bryan Tyler is a 73 y.o. year old male with end stage bone on bone arthritis of the knee who failed conservative treatment and elected for Total Knee Arthroplasty.   The risks, benefits, and alternatives were discussed at length including but not limited to the risks of infection, bleeding, nerve injury, stiffness, blood clots, the need for revision surgery, cardiopulmonary complications, among others, and they were willing to proceed.  OPERATIVE DESCRIPTION:  The patient was brought to the operative room and placed in a supine position.  Spinal anesthesia was administered.  IV antibiotics were given.  The lower extremity was prepped and draped in the usual sterile fashion.  Time out was performed.  The leg was elevated and exsanguinated and the tourniquet was inflated.  Anterior quadriceps tendon splitting approach was performed.  The patella was retracted and osteophytes were removed.  The anterior horn of the medial and lateral meniscus was removed and cruciate ligaments resected.   The distal femur was opened with the drill and the intramedullary distal femoral cutting jig was utilized, set at 5 degrees resecting 10 mm off the distal femur.  Care was taken to protect the collateral ligaments.  The distal femoral sizing jig was applied, taking care to  avoid notching.  Then the 4-in-1 cutting jig was applied and the anterior and posterior femur was cut, along with the chamfer cuts.    Then the extramedullary tibial cutting jig was utilized making the appropriate cut using the anterior tibial crest as a reference building in appropriate posterior slope.  Care was taken during the cut to protect the medial and collateral ligaments.  The proximal tibia was removed along with the posterior horns of the menisci.   The posterior medial femoral osteophytes and posterior lateral femoral osteophytes were removed.    The flexion gap was then measured and was symmetric with the extension gap, measured at 5.  I completed the distal femoral preparation using the appropriate jig to prepare the box.  The patella was then measured, and cut with the saw.    The proximal tibia sized and prepared accordingly with the reamer and the punch, and then all components were trialed with the trial insert.  The knee was found to have excellent balance and full motion.    The above named components were then cemented into place and all excess cement was removed.  The trial polyethylene component was in place during cementation, and then was exchanged for the real polyethylene component.    The knee was easily taken through a range of motion and the patella tracked well and the knee irrigated copiously and the parapatellar and subcutaneous tissue closed with vicryl, and monocryl with steri strips for the skin.  The arthrotomy was closed at 90 of flexion. The wounds were dressed with sterile gauze and the tourniquet released and the patient was awakened and returned to the PACU in  stable and satisfactory condition.  There were no complications.Tourniquet was let down after arthrotomy closure and hemostasis was obtained.  Total tourniquet time was 65 minutes.

## 2018-09-24 NOTE — Anesthesia Procedure Notes (Signed)
Procedure Name: MAC Date/Time: 09/24/2018 7:40 AM Performed by: Maxwell Caul, CRNA Pre-anesthesia Checklist: Patient identified, Emergency Drugs available, Suction available and Patient being monitored Oxygen Delivery Method: Simple face mask

## 2018-09-24 NOTE — Transfer of Care (Signed)
Immediate Anesthesia Transfer of Care Note  Patient: Bryan Tyler  Procedure(s) Performed: TOTAL KNEE ARTHROPLASTY (Right Knee)  Patient Location: PACU  Anesthesia Type:Regional and Spinal  Level of Consciousness: awake, alert  and oriented  Airway & Oxygen Therapy: Patient Spontanous Breathing and Patient connected to face mask oxygen  Post-op Assessment: Report given to RN and Post -op Vital signs reviewed and stable  Post vital signs: Reviewed and stable  Last Vitals:  Vitals Value Taken Time  BP 103/78 09/24/2018 10:04 AM  Temp    Pulse 87 09/24/2018 10:05 AM  Resp 15 09/24/2018 10:05 AM  SpO2 99 % 09/24/2018 10:05 AM  Vitals shown include unvalidated device data.  Last Pain:  Vitals:   09/24/18 0615  TempSrc:   PainSc: 0-No pain      Patients Stated Pain Goal: 4 (00/76/22 6333)  Complications: No apparent anesthesia complications

## 2018-09-24 NOTE — Anesthesia Procedure Notes (Signed)
Anesthesia Regional Block: Adductor canal block   Pre-Anesthetic Checklist: ,, timeout performed, Correct Patient, Correct Site, Correct Laterality, Correct Procedure, Correct Position, site marked, Risks and benefits discussed,  Surgical consent,  Pre-op evaluation,  At surgeon's request and post-op pain management  Laterality: Right  Prep: chloraprep       Needles:  Injection technique: Single-shot  Needle Type: Echogenic Needle     Needle Length: 9cm  Needle Gauge: 21     Additional Needles:   Procedures:,,,, ultrasound used (permanent image in chart),,,,  Narrative:  Start time: 09/24/2018 7:08 AM End time: 09/24/2018 7:15 AM Injection made incrementally with aspirations every 5 mL.  Performed by: Personally  Anesthesiologist: Suzette Battiest, MD

## 2018-09-24 NOTE — Evaluation (Signed)
Physical Therapy Evaluation Patient Details Name: Bryan Tyler MRN: [id number] DOB: 01/23/1946 Today's Date: 09/24/2018   History of Present Illness  Pt s/p R TKR and with hx of R TSR  Clinical Impression  Pt s/p R TKR and presents with decreased R LE strength/ROM and post op pain limiting functional mobility.  Pt should progress to dc home with family assist.    Follow Up Recommendations Follow surgeon's recommendation for DC plan and follow-up therapies    Equipment Recommendations  Rolling walker with 5" wheels    Recommendations for Other Services       Precautions / Restrictions Precautions Precautions: Knee;Fall Restrictions Weight Bearing Restrictions: No Other Position/Activity Restrictions: WBAT      Mobility  Bed Mobility Overal bed mobility: Needs Assistance Bed Mobility: Supine to Sit     Supine to sit: Min assist     General bed mobility comments: cues for sequence and use of L LE to self assist  Transfers Overall transfer level: Needs assistance Equipment used: Rolling walker (2 wheeled) Transfers: Sit to/from Stand Sit to Stand: Min assist         General transfer comment: cues for LE management and use of UEs to self assist  Ambulation/Gait Ambulation/Gait assistance: Min assist Gait Distance (Feet): 80 Feet Assistive device: Rolling walker (2 wheeled) Gait Pattern/deviations: Step-to pattern;Decreased step length - right;Decreased step length - left;Shuffle;Trunk flexed Gait velocity: decr   General Gait Details: cues for sequence, posture and position from [employer]            Wheelchair Mobility    Modified Rankin (Stroke Patients Only)       Balance Overall balance assessment: Mild deficits observed, not formally tested                                           Pertinent Vitals/Pain Pain Assessment: 0-10 Pain Score: 2  Pain Location: R knee Pain Descriptors / Indicators: Aching;Sore Pain  Intervention(s): Limited activity within patient's tolerance;Monitored during session;Premedicated before session;Ice applied    Home Living Family/patient expects to be discharged to:: Private residence Living Arrangements: Spouse/significant other Available Help at Discharge: Family Type of Home: House Home Access: Stairs to enter Entrance Stairs-Rails: None Entrance Stairs-Number of Steps: 2 Home Layout: Able to live on main level with bedroom/bathroom Home Equipment: Bedside commode      Prior Function Level of Independence: Independent               Hand Dominance        Extremity/Trunk Assessment   Upper Extremity Assessment Upper Extremity Assessment: Overall WFL for tasks assessed    Lower Extremity Assessment Lower Extremity Assessment: RLE deficits/detail    Cervical / Trunk Assessment Cervical / Trunk Assessment: Normal  Communication   Communication: No difficulties  Cognition Arousal/Alertness: Awake/alert Behavior During Therapy: WFL for tasks assessed/performed Overall Cognitive Status: Within Functional Limits for tasks assessed                                        General Comments      Exercises Total Joint Exercises Ankle Circles/Pumps: AROM;Both;20 reps;Supine   Assessment/Plan    PT Assessment Patient needs continued PT services  PT Problem List Decreased strength;Decreased range of motion;Decreased activity tolerance;Decreased mobility;Decreased knowledge  of use of DME;Decreased balance;Pain       PT Treatment Interventions DME instruction;Gait training;Stair training;Functional mobility training;Therapeutic activities;Therapeutic exercise;Patient/family education    PT Goals (Current goals can be found in the Care Plan section)  Acute Rehab PT Goals Patient Stated Goal: Regain IND PT Goal Formulation: With patient Time For Goal Achievement: 10/01/18 Potential to Achieve Goals: Good    Frequency 7X/week    Barriers to discharge        Co-evaluation               AM-PAC PT "6 Clicks" Mobility  Outcome Measure Help needed turning from your back to your side while in a flat bed without using bedrails?: A Little Help needed moving from lying on your back to sitting on the side of a flat bed without using bedrails?: A Little Help needed moving to and from a bed to a chair (including a wheelchair)?: A Little Help needed standing up from a chair using your arms (e.g., wheelchair or bedside chair)?: A Little Help needed to walk in hospital room?: A Little Help needed climbing 3-5 steps with a railing? : A Little 6 Click Score: 18    End of Session Equipment Utilized During Treatment: Gait belt Activity Tolerance: Patient tolerated treatment well Patient left: in chair;with call bell/phone within reach;with chair alarm set Nurse Communication: Mobility status PT Visit Diagnosis: Difficulty in walking, not elsewhere classified (R26.2)    Time: 1412-1440 PT Time Calculation (min) (ACUTE ONLY): 28 min   Charges:   PT Evaluation $PT Eval Low Complexity: 1 Low PT Treatments $Gait Training: 8-22 mins        [place] Pager [phone] Office [phone]   Dominiq Fontaine 09/24/2018, 2:55 PM

## 2018-09-24 NOTE — Care Plan (Signed)
Ortho Bundle Case Management Note  Patient Details  Name: Bryan Tyler MRN: [id number] Date of Birth: [birth year]  R TKA scheduled on 09-24-2018 DCP:  Home with spouse.  2 story home with 2 ste.   DME:  No needs.  Has a RW and doesn't want/need a 3-in-1, has sink and countertop beside toilet. PT:  [facility].  PT eval scheduled on 09-28-2018.                   DME Arranged:  N/A DME Agency:  NA  HH Arranged:  NA HH Agency:  NA  Additional Comments: Please contact me with any questions of if this plan should need to change.  Marianne Sofia, RN,CCM [facility]  [phone] 09/24/2018, 8:09 AM

## 2018-09-25 DIAGNOSIS — E039 Hypothyroidism, unspecified: Secondary | ICD-10-CM | POA: Diagnosis not present

## 2018-09-25 DIAGNOSIS — F329 Major depressive disorder, single episode, unspecified: Secondary | ICD-10-CM | POA: Diagnosis not present

## 2018-09-25 DIAGNOSIS — I739 Peripheral vascular disease, unspecified: Secondary | ICD-10-CM | POA: Diagnosis not present

## 2018-09-25 DIAGNOSIS — M1711 Unilateral primary osteoarthritis, right knee: Secondary | ICD-10-CM | POA: Diagnosis not present

## 2018-09-25 DIAGNOSIS — I10 Essential (primary) hypertension: Secondary | ICD-10-CM | POA: Diagnosis not present

## 2018-09-25 DIAGNOSIS — R262 Difficulty in walking, not elsewhere classified: Secondary | ICD-10-CM | POA: Diagnosis not present

## 2018-09-25 LAB — CBC
HCT: 36.7 % — ABNORMAL LOW (ref 39.0–52.0)
Hemoglobin: 11.5 g/dL — ABNORMAL LOW (ref 13.0–17.0)
MCH: 30.7 pg (ref 26.0–34.0)
MCHC: 31.3 g/dL (ref 30.0–36.0)
MCV: 98.1 fL (ref 80.0–100.0)
NRBC: 0 % (ref 0.0–0.2)
Platelets: 207 10*3/uL (ref 150–400)
RBC: 3.74 MIL/uL — ABNORMAL LOW (ref 4.22–5.81)
RDW: 13.2 % (ref 11.5–15.5)
WBC: 15.8 10*3/uL — ABNORMAL HIGH (ref 4.0–10.5)

## 2018-09-25 LAB — BASIC METABOLIC PANEL
Anion gap: 6 (ref 5–15)
BUN: 16 mg/dL (ref 8–23)
CO2: 27 mmol/L (ref 22–32)
Calcium: 8.3 mg/dL — ABNORMAL LOW (ref 8.9–10.3)
Chloride: 105 mmol/L (ref 98–111)
Creatinine, Ser: 0.78 mg/dL (ref 0.61–1.24)
GFR calc Af Amer: >60 mL/min (ref >60)
Glucose, Bld: 137 mg/dL — ABNORMAL HIGH (ref 70–99)
Potassium: 4.5 mmol/L (ref 3.5–5.1)
Sodium: 138 mmol/L (ref 135–145)

## 2018-09-25 MED ORDER — MELATONIN 1 MG PO TABS
2.0000 mg | ORAL_TABLET | Freq: Every evening | ORAL | Status: DC | PRN
  Administered 2018-09-25: 2 mg via ORAL
  Filled 2018-09-25 (×2): qty 2

## 2018-09-25 NOTE — Care Management Note (Signed)
Case Management Note  Patient Details  Name: KYHEEM BATHGATE MRN: [id number] Date of Birth: 07/11/1946  Subjective/Objective:   S/p R TKR                 Action/Plan: Spoke to pt and he is scheduled for OPPT. Requesting RW and 3n1 bedside commode. Contacted [facility] for DME for home, will deliver on 09/26/2018. Wife will assist at home as needed.   Expected Discharge Date:  09/24/18               Expected Discharge Plan:  OP Rehab  In-House Referral:  NA  Discharge planning Services  CM Consult  Post Acute Care Choice:  NA Choice offered to:  NA  DME Arranged:  N/A DME Agency:  NA  HH Arranged:  NA HH Agency:  NA  Status of Service:  Completed, signed off  If discussed at [place] of Stay Meetings, dates discussed:    Additional Comments:  Erenest Rasher, RN 09/25/2018, 5:19 PM

## 2018-09-25 NOTE — Progress Notes (Signed)
Physical Therapy Treatment Patient Details Name: Bryan Tyler MRN: [id number] DOB: 1946-06-01 Today's Date: 09/25/2018    History of Present Illness Pt s/p R TKR and with hx of R TSR    PT Comments    Pt motivated and progressing exceptionally well with mobility and with min pain despite taking very limited pain meds.  Pt eager for dc home tomorrow.   Follow Up Recommendations  Follow surgeon's recommendation for DC plan and follow-up therapies     Equipment Recommendations  Rolling walker with 5" wheels    Recommendations for Other Services       Precautions / Restrictions Precautions Precautions: Knee;Fall Restrictions Weight Bearing Restrictions: No Other Position/Activity Restrictions: WBAT    Mobility  Bed Mobility               General bed mobility comments: Pt up in chair and requests back to same  Transfers Overall transfer level: Needs assistance Equipment used: Rolling walker (2 wheeled) Transfers: Sit to/from Stand Sit to Stand: Min guard         General transfer comment: cues for LE management and use of UEs to self assist  Ambulation/Gait Ambulation/Gait assistance: Min guard Gait Distance (Feet): 222 Feet Assistive device: Rolling walker (2 wheeled) Gait Pattern/deviations: Decreased step length - right;Decreased step length - left;Shuffle;Trunk flexed;Step-to pattern;Step-through pattern Gait velocity: decr   General Gait Details: cues for sequence, posture and position from [employer] Mobility    Modified Rankin (Stroke Patients Only)       Balance Overall balance assessment: Mild deficits observed, not formally tested                                          Cognition Arousal/Alertness: Awake/alert Behavior During Therapy: WFL for tasks assessed/performed Overall Cognitive Status: Within Functional Limits for tasks assessed                                         Exercises Total Joint Exercises Ankle Circles/Pumps: AROM;Both;20 reps;Supine Quad Sets: AROM;Both;10 reps;Supine Heel Slides: AAROM;Right;15 reps;Supine Straight Leg Raises: AROM;Right;10 reps;Supine Goniometric ROM: AAROM R knee - 8 - 110    General Comments        Pertinent Vitals/Pain Pain Assessment: 0-10 Pain Score: 4  Pain Location: R knee with flex Pain Descriptors / Indicators: Aching;Sore Pain Intervention(s): Limited activity within patient's tolerance;Monitored during session;Premedicated before session;Ice applied    Home Living                      Prior Function            PT Goals (current goals can now be found in the care plan section) Acute Rehab PT Goals Patient Stated Goal: Regain IND PT Goal Formulation: With patient Time For Goal Achievement: 10/01/18 Potential to Achieve Goals: Good Progress towards PT goals: Progressing toward goals    Frequency    7X/week      PT Plan Current plan remains appropriate    Co-evaluation              AM-PAC PT "6 Clicks" Mobility   Outcome Measure  Help needed turning from your back to your side while  in a flat bed without using bedrails?: A Little Help needed moving from lying on your back to sitting on the side of a flat bed without using bedrails?: A Little Help needed moving to and from a bed to a chair (including a wheelchair)?: A Little Help needed standing up from a chair using your arms (e.g., wheelchair or bedside chair)?: A Little Help needed to walk in hospital room?: A Little Help needed climbing 3-5 steps with a railing? : A Little 6 Click Score: 18    End of Session Equipment Utilized During Treatment: Gait belt Activity Tolerance: Patient tolerated treatment well Patient left: in chair;with call bell/phone within reach;with chair alarm set Nurse Communication: Mobility status PT Visit Diagnosis: Difficulty in walking, not elsewhere classified (R26.2)      Time: 3845-3646 PT Time Calculation (min) (ACUTE ONLY): 27 min  Charges:  $Gait Training: 8-22 mins $Therapeutic Exercise: 8-22 mins                     [place] Pager [phone] Office [phone]    Paulena Servais 09/25/2018, 1:54 PM

## 2018-09-25 NOTE — Progress Notes (Signed)
Subjective: 1 Day Post-Op Procedure(s) (LRB): TOTAL KNEE ARTHROPLASTY (Right) Patient reports pain as mild to knee.  Reports a good night. Progress with PT. Denies CP or SOB.  Objective: Vital signs in last 24 hours: Temp:  [97.3 F (36.3 C)-97.9 F (36.6 C)] 97.7 F (36.5 C) (02/15 0527) Pulse Rate:  [61-88] 64 (02/15 0527) Resp:  [15-18] 16 (02/15 0527) BP: (97-121)/(60-106) 113/69 (02/15 0527) SpO2:  [95 %-100 %] 100 % (02/15 0527)  Intake/Output from previous day: 02/14 0701 - 02/15 0700 In: 3820.2 [P.O.:660; I.V.:2910.2; IV Piggyback:250] Out: 8250 [Urine:2850; Drains:270; Blood:25] Intake/Output this shift: No intake/output data recorded.  Recent Labs    09/22/18 1351 09/25/18 0505  HGB 14.5 11.5*   Recent Labs    09/22/18 1351 09/25/18 0505  WBC 6.7 15.8*  RBC 4.69 3.74*  HCT 46.1 36.7*  PLT 217 207   Recent Labs    09/22/18 1351 09/25/18 0505  NA 138 138  K 4.4 4.5  CL 102 105  CO2 27 27  BUN 16 16  CREATININE 0.86 0.78  GLUCOSE 91 137*  CALCIUM 9.3 8.3*   Recent Labs    09/22/18 1351  INR 0.89    Well nourished. Alert and oriented x3. RRR, Lungs clear, BS x4. Abdomen soft and non tender. Right Calf soft and non tender. Right knee dressing C/D/I. No DVT signs. Compartment soft. No signs of infection.  Right LE grossly neurovascular intact.   Assessment/Plan: 1 Day Post-Op Procedure(s) (LRB): TOTAL KNEE ARTHROPLASTY (Right) Up with PT Drain pulled with tip intact Pain management Plan d/c home tomorrow    Anticipated LOS equal to or greater than 2 midnights due to - Age 7 and older with one or more of the following:  - Obesity  - Expected need for hospital services (PT, OT, Nursing) required for safe  discharge  - Anticipated need for postoperative skilled nursing care or inpatient rehab  - Active co-morbidities: None OR   - Unanticipated findings during/Post Surgery: Slow post-op progression: GI, pain control, mobility  - Patient  is a high risk of re-admission due to: None    Bryan Tyler 09/25/2018, 8:19 AM

## 2018-09-26 DIAGNOSIS — I10 Essential (primary) hypertension: Secondary | ICD-10-CM | POA: Diagnosis not present

## 2018-09-26 DIAGNOSIS — F329 Major depressive disorder, single episode, unspecified: Secondary | ICD-10-CM | POA: Diagnosis not present

## 2018-09-26 DIAGNOSIS — I739 Peripheral vascular disease, unspecified: Secondary | ICD-10-CM | POA: Diagnosis not present

## 2018-09-26 DIAGNOSIS — M1711 Unilateral primary osteoarthritis, right knee: Secondary | ICD-10-CM | POA: Diagnosis not present

## 2018-09-26 DIAGNOSIS — E039 Hypothyroidism, unspecified: Secondary | ICD-10-CM | POA: Diagnosis not present

## 2018-09-26 DIAGNOSIS — R262 Difficulty in walking, not elsewhere classified: Secondary | ICD-10-CM | POA: Diagnosis not present

## 2018-09-26 LAB — CBC
HCT: 35.3 % — ABNORMAL LOW (ref 39.0–52.0)
Hemoglobin: 11.3 g/dL — ABNORMAL LOW (ref 13.0–17.0)
MCH: 31.4 pg (ref 26.0–34.0)
MCHC: 32 g/dL (ref 30.0–36.0)
MCV: 98.1 fL (ref 80.0–100.0)
PLATELETS: 197 10*3/uL (ref 150–400)
RBC: 3.6 MIL/uL — ABNORMAL LOW (ref 4.22–5.81)
RDW: 13.3 % (ref 11.5–15.5)
WBC: 15.5 10*3/uL — ABNORMAL HIGH (ref 4.0–10.5)
nRBC: 0 % (ref 0.0–0.2)

## 2018-09-26 NOTE — Progress Notes (Signed)
Physical Therapy Treatment Patient Details Name: Bryan Tyler MRN: [id number] DOB: Aug 17, 1945 Today's Date: 09/26/2018    History of Present Illness Pt s/p R TKR and with hx of R TSR    PT Comments    Pt progressing well with mobility and eager for dc home.  Pt reviewed stairs and home therex program with written instruction provided.   Follow Up Recommendations  Follow surgeon's recommendation for DC plan and follow-up therapies     Equipment Recommendations  Rolling walker with 5" wheels    Recommendations for Other Services       Precautions / Restrictions Precautions Precautions: Knee;Fall Restrictions Weight Bearing Restrictions: No Other Position/Activity Restrictions: WBAT    Mobility  Bed Mobility Overal bed mobility: Needs Assistance Bed Mobility: Supine to Sit     Supine to sit: Supervision     General bed mobility comments: Pt up in chair and requests back to same  Transfers Overall transfer level: Needs assistance Equipment used: Rolling walker (2 wheeled) Transfers: Sit to/from Stand Sit to Stand: Supervision         General transfer comment: cues for LE management and use of UEs to self assist  Ambulation/Gait Ambulation/Gait assistance: Min guard;Supervision Gait Distance (Feet): 250 Feet Assistive device: Rolling walker (2 wheeled) Gait Pattern/deviations: Decreased step length - right;Decreased step length - left;Shuffle;Trunk flexed;Step-to pattern;Step-through pattern Gait velocity: decr   General Gait Details: cues for sequence, posture and position from RW   Stairs Stairs: Yes Stairs assistance: Min assist Stair Management: No rails;Step to pattern;Forwards;With walker Number of Stairs: 3 General stair comments: single step x 3 with RW and cues for sequence and RW/foot placement.  Written instruction provided   Wheelchair Mobility    Modified Rankin (Stroke Patients Only)       Balance Overall balance assessment:  Mild deficits observed, not formally tested                                          Cognition Arousal/Alertness: Awake/alert Behavior During Therapy: WFL for tasks assessed/performed Overall Cognitive Status: Within Functional Limits for tasks assessed                                        Exercises Total Joint Exercises Ankle Circles/Pumps: AROM;Both;20 reps;Supine Quad Sets: AROM;Both;10 reps;Supine Heel Slides: AAROM;Right;15 reps;Supine Straight Leg Raises: AROM;Right;Supine;20 reps Long Arc Quad: AROM;Right;10 reps;Seated Knee Flexion: AAROM;AROM;Right;10 reps;Seated Goniometric ROM: AAROM R knee -5 - 110    General Comments        Pertinent Vitals/Pain Pain Assessment: 0-10 Pain Score: 4  Pain Location: R knee with flex Pain Descriptors / Indicators: Aching;Sore Pain Intervention(s): Limited activity within patient's tolerance;Monitored during session;Premedicated before session;Ice applied    Home Living                      Prior Function            PT Goals (current goals can now be found in the care plan section) Acute Rehab PT Goals Patient Stated Goal: Regain IND PT Goal Formulation: With patient Time For Goal Achievement: 10/01/18 Potential to Achieve Goals: Good Progress towards PT goals: Progressing toward goals    Frequency    7X/week      PT Plan Current plan  remains appropriate    Co-evaluation              AM-PAC PT "6 Clicks" Mobility   Outcome Measure  Help needed turning from your back to your side while in a flat bed without using bedrails?: A Little Help needed moving from lying on your back to sitting on the side of a flat bed without using bedrails?: A Little Help needed moving to and from a bed to a chair (including a wheelchair)?: A Little Help needed standing up from a chair using your arms (e.g., wheelchair or bedside chair)?: A Little Help needed to walk in hospital room?:  A Little Help needed climbing 3-5 steps with a railing? : A Little 6 Click Score: 18    End of Session Equipment Utilized During Treatment: Gait belt Activity Tolerance: Patient tolerated treatment well Patient left: in chair;with call bell/phone within reach;with chair alarm set Nurse Communication: Mobility status PT Visit Diagnosis: Difficulty in walking, not elsewhere classified (R26.2)     Time: 3888-7579 PT Time Calculation (min) (ACUTE ONLY): 30 min  Charges:  $Gait Training: 8-22 mins $Therapeutic Exercise: 8-22 mins                     [place] Pager [phone] Office [phone]    Dacari Beckstrand 09/26/2018, 12:50 PM

## 2018-09-26 NOTE — Discharge Instructions (Signed)

## 2018-09-26 NOTE — Progress Notes (Signed)
     Subjective: 2 Days Post-Op Procedure(s) (LRB): TOTAL KNEE ARTHROPLASTY (Right)   Patient reports pain as mild, pain controlled.  No events throughout the night,  Has been doing really well and ready to be discharged home.    Objective:   VITALS:   Vitals:   09/25/18 2157 09/26/18 0616  BP: 116/79 130/80  Pulse: 71 66  Resp: 16 16  Temp: 97.6 F (36.4 C) 97.9 F (36.6 C)  SpO2: 96% 100%    Dorsiflexion/Plantar flexion intact Incision: dressing C/D/I No cellulitis present Compartment soft  LABS Recent Labs    09/25/18 0505 09/26/18 0414  HGB 11.5* 11.3*  HCT 36.7* 35.3*  WBC 15.8* 15.5*  PLT 207 197    Recent Labs    09/25/18 0505  NA 138  K 4.5  BUN 16  CREATININE 0.78  GLUCOSE 137*     Assessment/Plan: 2 Days Post-Op Procedure(s) (LRB): TOTAL KNEE ARTHROPLASTY (Right) Up with therapy Discharge home Follow up in 2 weeks at [facility] ([place]). Follow up with Dr. Theda Sers in 2 weeks.  Contact information:  [facility]) [address], [address] [place]. Bryan Tyler   PAC  09/26/2018, 8:23 AM

## 2018-09-27 ENCOUNTER — Encounter (HOSPITAL_COMMUNITY): Payer: Self-pay | Admitting: Specialist

## 2018-09-28 DIAGNOSIS — M25561 Pain in right knee: Secondary | ICD-10-CM | POA: Diagnosis not present

## 2018-10-01 DIAGNOSIS — M25561 Pain in right knee: Secondary | ICD-10-CM | POA: Diagnosis not present

## 2018-10-04 DIAGNOSIS — M25561 Pain in right knee: Secondary | ICD-10-CM | POA: Diagnosis not present

## 2018-10-06 ENCOUNTER — Ambulatory Visit: Payer: Medicare Other | Admitting: Psychiatry

## 2018-10-06 DIAGNOSIS — M25561 Pain in right knee: Secondary | ICD-10-CM | POA: Diagnosis not present

## 2018-10-11 DIAGNOSIS — Z471 Aftercare following joint replacement surgery: Secondary | ICD-10-CM | POA: Diagnosis not present

## 2018-10-11 DIAGNOSIS — Z4789 Encounter for other orthopedic aftercare: Secondary | ICD-10-CM | POA: Diagnosis not present

## 2018-10-12 DIAGNOSIS — M25561 Pain in right knee: Secondary | ICD-10-CM | POA: Diagnosis not present

## 2018-10-15 DIAGNOSIS — M25561 Pain in right knee: Secondary | ICD-10-CM | POA: Diagnosis not present

## 2018-10-18 DIAGNOSIS — M25561 Pain in right knee: Secondary | ICD-10-CM | POA: Diagnosis not present

## 2018-10-21 DIAGNOSIS — M25561 Pain in right knee: Secondary | ICD-10-CM | POA: Diagnosis not present

## 2018-10-25 DIAGNOSIS — M25561 Pain in right knee: Secondary | ICD-10-CM | POA: Diagnosis not present

## 2018-11-01 DIAGNOSIS — M25561 Pain in right knee: Secondary | ICD-10-CM | POA: Diagnosis not present

## 2018-11-05 DIAGNOSIS — M25561 Pain in right knee: Secondary | ICD-10-CM | POA: Diagnosis not present

## 2018-11-15 DIAGNOSIS — L821 Other seborrheic keratosis: Secondary | ICD-10-CM | POA: Diagnosis not present

## 2018-11-15 DIAGNOSIS — L570 Actinic keratosis: Secondary | ICD-10-CM | POA: Diagnosis not present

## 2018-11-15 DIAGNOSIS — X32XXXD Exposure to sunlight, subsequent encounter: Secondary | ICD-10-CM | POA: Diagnosis not present

## 2018-11-15 DIAGNOSIS — D225 Melanocytic nevi of trunk: Secondary | ICD-10-CM | POA: Diagnosis not present

## 2018-11-25 ENCOUNTER — Telehealth: Payer: Self-pay | Admitting: Psychiatry

## 2018-11-25 NOTE — Telephone Encounter (Signed)
Patient called and said that he needs 90 day supply of his medications [facility]. He needs gabapentin 800 mg 1 @ bedtime. Venlafaxine 150 mg cap 2 x daily at breakfast, aripiprazole 10 mg 1/2 tab daily,buspirone 15 mg1 tab 2 x daily and clonazapam 1 mg 1 x daily.

## 2018-11-26 ENCOUNTER — Other Ambulatory Visit: Payer: Self-pay

## 2018-11-26 MED ORDER — CLONAZEPAM 1 MG PO TABS: 1.0000 mg | ORAL_TABLET | Freq: Every evening | ORAL | 0 refills | Status: DC | PRN

## 2018-11-26 MED ORDER — GABAPENTIN 800 MG PO TABS: 800.0000 mg | ORAL_TABLET | Freq: Every day | ORAL | 0 refills | Status: DC

## 2018-11-26 MED ORDER — ARIPIPRAZOLE 10 MG PO TABS: 5.0000 mg | ORAL_TABLET | Freq: Every day | ORAL | 0 refills | Status: DC

## 2018-11-26 MED ORDER — BUSPIRONE HCL 15 MG PO TABS: 15.0000 mg | ORAL_TABLET | Freq: Two times a day (BID) | ORAL | 0 refills | Status: DC

## 2018-11-26 MED ORDER — VENLAFAXINE HCL ER 150 MG PO CP24: 300.0000 mg | ORAL_CAPSULE | Freq: Every day | ORAL | 0 refills | Status: DC

## 2018-11-26 NOTE — Telephone Encounter (Signed)
Submitted to provider for review

## 2018-12-16 ENCOUNTER — Other Ambulatory Visit: Payer: Self-pay | Admitting: Cardiothoracic Surgery

## 2018-12-16 DIAGNOSIS — I712 Thoracic aortic aneurysm, without rupture, unspecified: Secondary | ICD-10-CM

## 2018-12-16 NOTE — Progress Notes (Unsigned)
ct 

## 2018-12-17 ENCOUNTER — Other Ambulatory Visit: Payer: Self-pay | Admitting: Endocrinology

## 2018-12-17 DIAGNOSIS — Z96651 Presence of right artificial knee joint: Secondary | ICD-10-CM | POA: Insufficient documentation

## 2018-12-17 NOTE — Telephone Encounter (Signed)
Please forward refill request to pt's new primary care provider.  

## 2018-12-20 DIAGNOSIS — Z471 Aftercare following joint replacement surgery: Secondary | ICD-10-CM | POA: Diagnosis not present

## 2018-12-20 DIAGNOSIS — Z96651 Presence of right artificial knee joint: Secondary | ICD-10-CM | POA: Diagnosis not present

## 2018-12-22 ENCOUNTER — Other Ambulatory Visit: Payer: Self-pay | Admitting: Physician Assistant

## 2018-12-23 DIAGNOSIS — H2513 Age-related nuclear cataract, bilateral: Secondary | ICD-10-CM | POA: Diagnosis not present

## 2018-12-27 ENCOUNTER — Other Ambulatory Visit: Payer: Self-pay | Admitting: Endocrinology

## 2018-12-28 ENCOUNTER — Other Ambulatory Visit: Payer: Self-pay | Admitting: Physician Assistant

## 2018-12-29 ENCOUNTER — Encounter: Payer: Self-pay | Admitting: Emergency Medicine

## 2018-12-29 NOTE — Telephone Encounter (Signed)
Spoke to pt, pt asking if he could have labs done to ck his thyroid so that he can get his refill on Rx, please advise

## 2018-12-29 NOTE — Telephone Encounter (Signed)
Please advise 

## 2018-12-29 NOTE — Telephone Encounter (Signed)
This medication was prescribed by Dr. Loanne Drilling and needs to be refilled. Pt was told to ask Dr. Hassell Done to takeover this Rx and refill. Pt is running low on the med. Please advise

## 2018-12-29 NOTE — Telephone Encounter (Signed)
Ok to refill medicine. Needs lab appt for TSH and BMP.

## 2019-01-05 ENCOUNTER — Ambulatory Visit (INDEPENDENT_AMBULATORY_CARE_PROVIDER_SITE_OTHER): Payer: Medicare Other

## 2019-01-05 ENCOUNTER — Ambulatory Visit: Payer: Medicare Other | Admitting: Psychiatry

## 2019-01-05 ENCOUNTER — Other Ambulatory Visit: Payer: Self-pay

## 2019-01-05 DIAGNOSIS — E018 Other iodine-deficiency related thyroid disorders and allied conditions: Secondary | ICD-10-CM

## 2019-01-05 NOTE — Addendum Note (Signed)
Addended by: Doran Clay A on: 01/05/2019 02:25 PM   Modules accepted: Orders

## 2019-01-06 LAB — TSH: TSH: 3.26 mIU/L (ref 0.40–4.50)

## 2019-01-11 IMAGING — DX DG FOOT COMPLETE 3+V*R*
3 series · 3 of 3 positions shown · non-contrast
Comparison: None.

CLINICAL DATA: Pain in the right toes radiating to the metatarsals

EXAM:
RIGHT FOOT COMPLETE - 3+ VIEW

[dg foot complete right (1 of 3)]
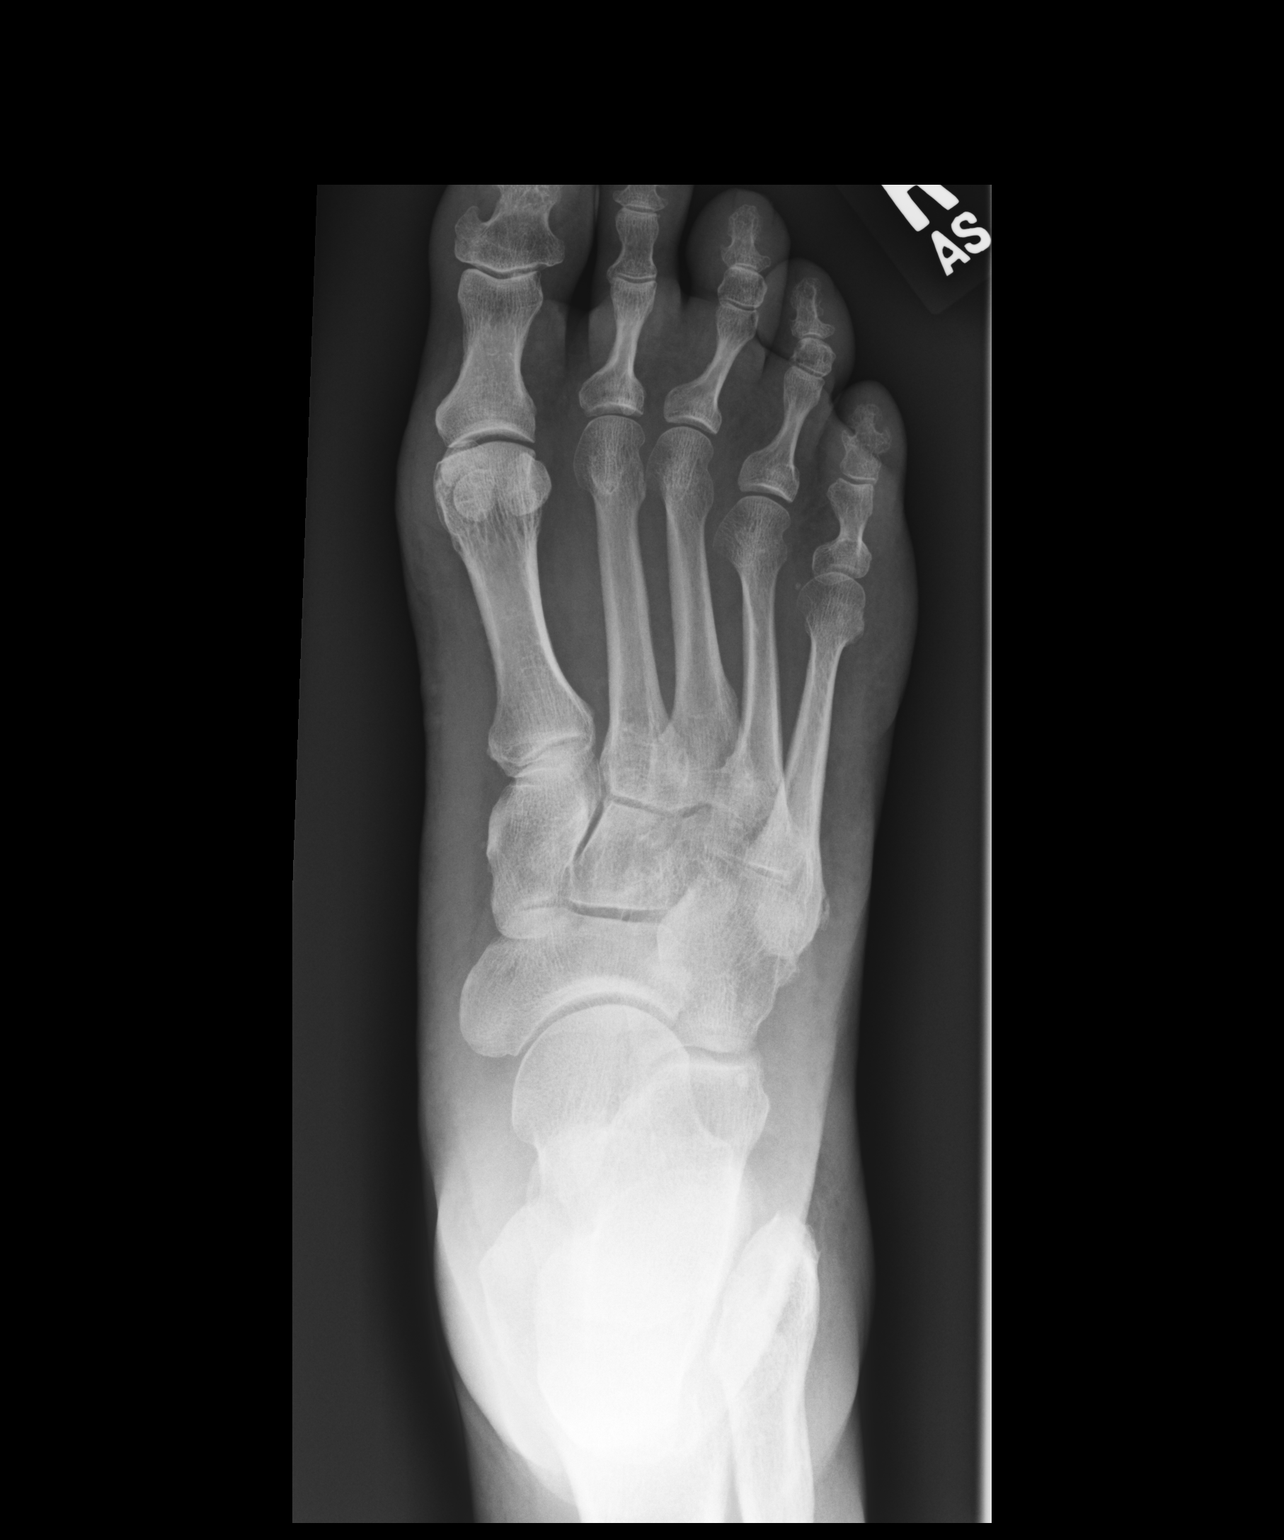

[dg foot complete right (2 of 3)]
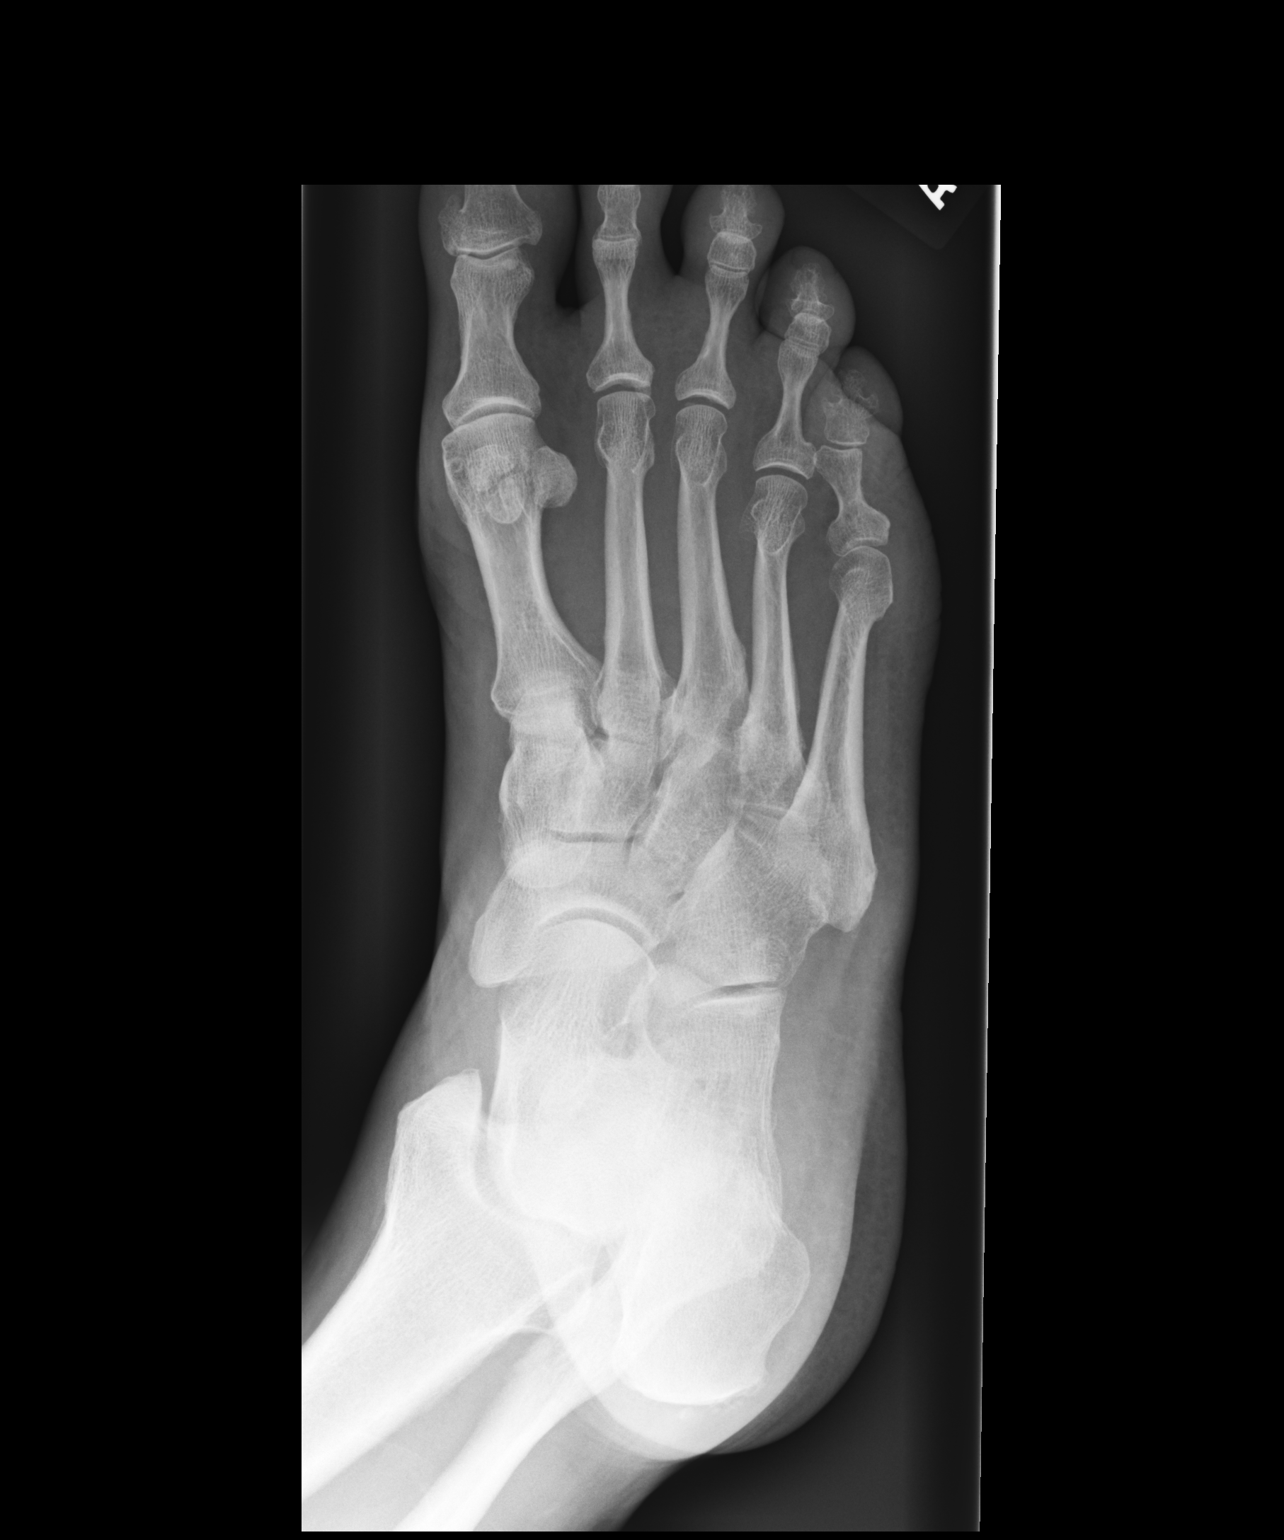

[dg foot complete right (3 of 3)]
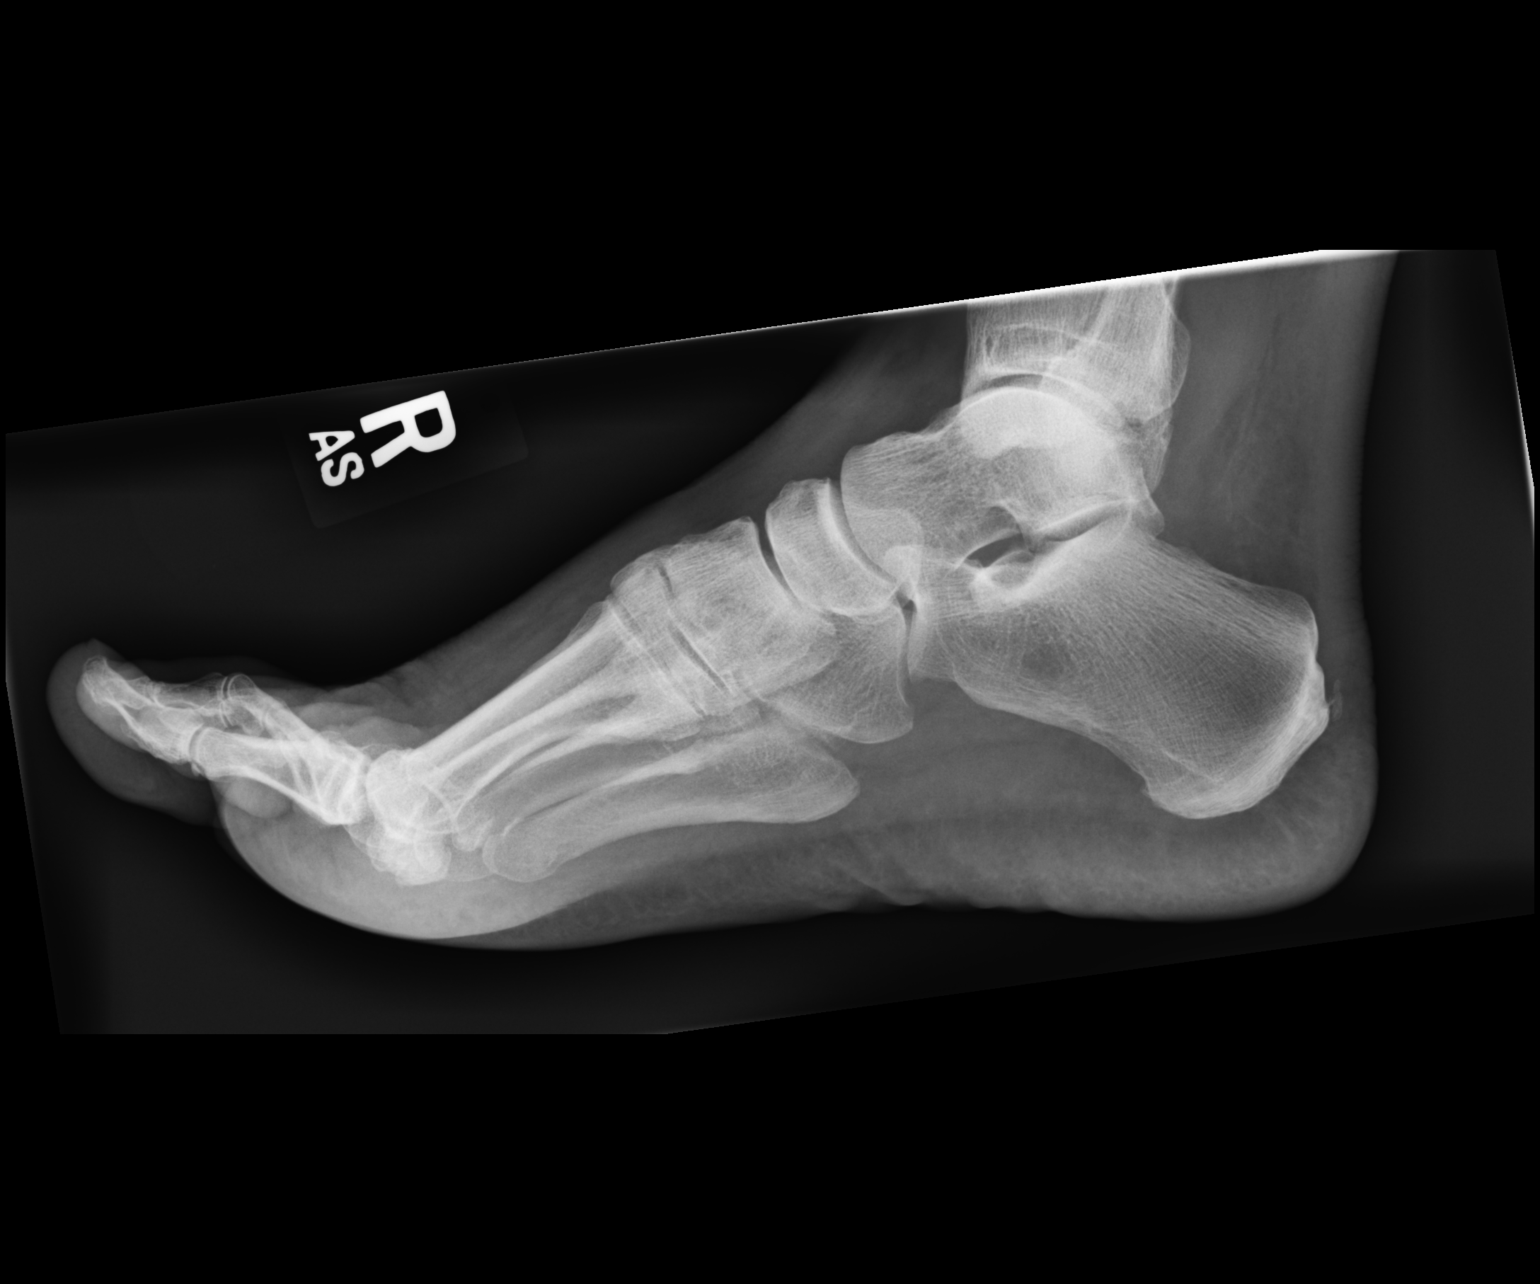

[3 of 3 positions shown; findings below may reference images not displayed]

FINDINGS: No fracture or malalignment. Minimal degenerative change at the
first MTP joint. Joint spaces otherwise appear maintained. Soft
tissues are unremarkable.
IMPRESSION: No acute osseous abnormality. Minimal degenerative change at the
first MTP joint

## 2019-01-21 ENCOUNTER — Other Ambulatory Visit: Payer: Self-pay | Admitting: Physician Assistant

## 2019-01-21 ENCOUNTER — Telehealth: Payer: Self-pay | Admitting: Psychiatry

## 2019-01-21 MED ORDER — CLONAZEPAM 1 MG PO TABS: 1.0000 mg | ORAL_TABLET | Freq: Every evening | ORAL | 0 refills | Status: DC | PRN

## 2019-01-21 NOTE — Telephone Encounter (Signed)
Patient called and said that he never got the klonopin 1 mg back in may that was [facility]. He has an appointment on 6/22 with dr. Clovis Pu. Please send in 10 days worth to the [facility] in [place]. This will help him sleep until he sees dr. Clovis Pu

## 2019-01-21 NOTE — Telephone Encounter (Signed)
Sent Rx for #30

## 2019-01-26 ENCOUNTER — Other Ambulatory Visit: Payer: Self-pay

## 2019-01-28 MED ORDER — CLONAZEPAM 1 MG PO TABS: 1.0000 mg | ORAL_TABLET | Freq: Every evening | ORAL | 0 refills | Status: DC | PRN

## 2019-01-31 ENCOUNTER — Ambulatory Visit (INDEPENDENT_AMBULATORY_CARE_PROVIDER_SITE_OTHER): Payer: Medicare Other | Admitting: Psychiatry

## 2019-01-31 ENCOUNTER — Encounter: Payer: Self-pay | Admitting: Psychiatry

## 2019-01-31 ENCOUNTER — Other Ambulatory Visit: Payer: Self-pay

## 2019-01-31 DIAGNOSIS — F411 Generalized anxiety disorder: Secondary | ICD-10-CM | POA: Diagnosis not present

## 2019-01-31 DIAGNOSIS — F5105 Insomnia due to other mental disorder: Secondary | ICD-10-CM | POA: Diagnosis not present

## 2019-01-31 DIAGNOSIS — R251 Tremor, unspecified: Secondary | ICD-10-CM | POA: Diagnosis not present

## 2019-01-31 DIAGNOSIS — F3342 Major depressive disorder, recurrent, in full remission: Secondary | ICD-10-CM

## 2019-01-31 MED ORDER — CLONAZEPAM 1 MG PO TABS: 1.0000 mg | ORAL_TABLET | Freq: Every evening | ORAL | 1 refills | Status: DC | PRN

## 2019-01-31 MED ORDER — BUSPIRONE HCL 15 MG PO TABS: 15.0000 mg | ORAL_TABLET | Freq: Two times a day (BID) | ORAL | 1 refills | Status: DC

## 2019-01-31 MED ORDER — VENLAFAXINE HCL ER 150 MG PO CP24: 300.0000 mg | ORAL_CAPSULE | Freq: Every day | ORAL | 1 refills | Status: DC

## 2019-01-31 MED ORDER — ARIPIPRAZOLE 2 MG PO TABS: 2.0000 mg | ORAL_TABLET | Freq: Every day | ORAL | 1 refills | Status: DC

## 2019-01-31 MED ORDER — GABAPENTIN 800 MG PO TABS: 800.0000 mg | ORAL_TABLET | Freq: Every day | ORAL | 1 refills | Status: DC

## 2019-01-31 NOTE — Progress Notes (Signed)
Bryan Tyler [id number] March 05, 1946 73 y.o.  Subjective:   Patient ID:  Bryan Tyler is a 73 y.o. (DOB 1945/10/07) male.  Chief Complaint:  Chief Complaint  Patient presents with  . Anxiety  . Sleeping Problem  . Medication Problem    dizziness.    HPI Bryan Tyler presents to the office today for follow-up of history of major depression and anxiety and insomnia.  Last seen September.  No med changes.  Depression controlled with meds.Pt reports that mood is Anxious and describes anxiety as Moderate. Anxiety symptoms include: Excessive Worry,., worse at night lying down when not distracted.   Pt reports couldn't go to sleep before clonazepam and when ran out.  Now no sleep issues 9 hours. No hangover.  Pt reports that appetite is good. Pt reports that energy is good and good. Concentration is good. Suicidal thoughts:  denied by patient.  Past Psychiatric Medication Trials: Abilify 5 started 2017, Effexor 300, Buspirone 15 BID,  Review of Systems:  Review of Systems  Cardiovascular:       Enlarged aorta with follow-up next month.  Neurological: Positive for dizziness and tremors. Negative for weakness.  Neuro said no Parkinson's dz.  Medications: I have reviewed the patient's current medications.  Current Outpatient Medications  Medication Sig Dispense Refill  . amLODipine (NORVASC) 2.5 MG tablet Take 2.5 mg by mouth daily.    . ARIPiprazole (ABILIFY) 2 MG tablet Take 1 tablet (2 mg total) by mouth daily. 90 tablet 1  . aspirin EC 325 MG tablet Take 1 tablet (325 mg total) by mouth 2 (two) times daily. 60 tablet 11  . atorvastatin (LIPITOR) 40 MG tablet TAKE 1 TABLET EVERY EVENING (Patient taking differently: Take 40 mg by mouth every evening. ) 90 tablet 2  . busPIRone (BUSPAR) 15 MG tablet Take 1 tablet (15 mg total) by mouth 2 (two) times daily. 180 tablet 1  . clonazePAM (KLONOPIN) 1 MG tablet Take 1 tablet (1 mg total) by mouth at bedtime as needed for  anxiety. 90 tablet 1  . gabapentin (NEURONTIN) 800 MG tablet Take 1 tablet (800 mg total) by mouth at bedtime. 90 tablet 1  . LEVOXYL 112 MCG tablet TAKE 1 TABLET DAILY 90 tablet 1  . losartan (COZAAR) 100 MG tablet TAKE 1 TABLET(100 MG) BY MOUTH DAILY 90 tablet 1  . Multiple Vitamin (MULTIVITAMIN WITH MINERALS) TABS tablet Take 1 tablet by mouth daily.    Marland Kitchen omeprazole (PRILOSEC) 40 MG capsule Take 1 capsule (40 mg total) by mouth daily as needed (heartburn). 90 capsule 1  . tamsulosin (FLOMAX) 0.4 MG CAPS capsule Take 1 capsule (0.4 mg total) by mouth daily. (Patient taking differently: Take 0.4 mg by mouth every evening. ) 30 capsule 3  . venlafaxine XR (EFFEXOR-XR) 150 MG 24 hr capsule Take 2 capsules (300 mg total) by mouth daily with breakfast. 180 capsule 1  . methocarbamol (ROBAXIN) 500 MG tablet Take 1 tablet (500 mg total) by mouth every 8 (eight) hours as needed for muscle spasms. 50 tablet 1  . oxymetazoline (ANEFRIN NASAL SPRAY) 0.05 % nasal spray Place 2 sprays into both nostrils 3 (three) times daily as needed for congestion.      No current facility-administered medications for this visit.     Medication Side Effects: Other: brief dizzy when stands up  Allergies:  Allergies  Allergen Reactions  . Codeine Nausea And Vomiting and Other (See Comments)    DIZZINESS  . Ciprofloxacin Other (See  Comments)    Unknown    Past Medical History:  Diagnosis Date  . Adenocarcinoma of prostate ([place]) 04/16/2013  . Aneurysm, aorta, thoracic ([place])   . Anxiety   . Arthritis    "knees" (05/21/2017)  . Depression   . Diverticulosis   . Dyspnea    on exertion for a long period time  . GERD (gastroesophageal reflux disease)   . Hyperlipidemia   . Hypertension   . Hypothyroidism, postradioiodine therapy    1980's  . Nocturia   . Organic impotence   . OSA (obstructive sleep apnea)    NO CPAP--  S/P SURGERY  2002  . Osteoporosis   . Pneumonia 1970  . PONV (postoperative nausea and  vomiting)   . Urge urinary incontinence   . Wears contact lenses     Family History  Problem Relation Age of Onset  . Cancer Mother        Pancreatic Cancer  . Heart disease Father 78       CABG    Social History   Socioeconomic History  . Marital status: Married    Spouse name: Not on file  . Number of children: Not on file  . Years of education: Not on file  . Highest education level: Not on file  Occupational History  . Occupation: Patent examiner  Social Needs  . Financial resource strain: Not on file  . Food insecurity    Worry: Not on file    Inability: Not on file  . Transportation needs    Medical: Not on file    Non-medical: Not on file  Tobacco Use  . Smoking status: Former Smoker    Packs/Tyler: 1.00    Years: 20.00    Pack years: 20.00    Types: Cigarettes    Quit date: 08/11/1988    Years since quitting: 30.4  . Smokeless tobacco: Never Used  Substance and Sexual Activity  . Alcohol use: Yes    Comment: 05/21/2017 "1-2 beers/month"  . Drug use: No  . Sexual activity: Not Currently  Lifestyle  . Physical activity    Days per week: Not on file    Minutes per session: Not on file  . Stress: Not on file  Relationships  . Social Herbalist on phone: Not on file    Gets together: Not on file    Attends religious service: Not on file    Active member of club or organization: Not on file    Attends meetings of clubs or organizations: Not on file    Relationship status: Not on file  . Intimate partner violence    Fear of current or ex partner: Not on file    Emotionally abused: Not on file    Physically abused: Not on file    Forced sexual activity: Not on file  Other Topics Concern  . Not on file  Social History Narrative  . Not on file    Past Medical History, Surgical history, Social history, and Family history were reviewed and updated as appropriate.   Please see review of systems for further details on the patient's review  from today.   Objective:   Physical Exam:  There were no vitals taken for this visit.  Physical Exam Constitutional:      General: He is not in acute distress.    Appearance: He is well-developed.  Musculoskeletal:        General: No deformity.  Neurological:  Mental Status: He is alert and oriented to person, place, and time.     Motor: Tremor present.     Coordination: Coordination normal.     Comments: Tremor 2+  Psychiatric:        Attention and Perception: Attention and perception normal. He does not perceive auditory or visual hallucinations.        Mood and Affect: Mood normal. Mood is not anxious or depressed. Affect is not labile, blunt, angry or inappropriate.        Speech: Speech normal.        Behavior: Behavior normal.        Thought Content: Thought content normal. Thought content does not include homicidal or suicidal ideation. Thought content does not include homicidal or suicidal plan.        Cognition and Memory: Cognition and memory normal.        Judgment: Judgment normal.     Comments: Insight intact. No delusions.      Lab Review:     Component Value Date/Time   NA 138 09/25/2018 0505   K 4.5 09/25/2018 0505   CL 105 09/25/2018 0505   CO2 27 09/25/2018 0505   GLUCOSE 137 (H) 09/25/2018 0505   BUN 16 09/25/2018 0505   CREATININE 0.78 09/25/2018 0505   CALCIUM 8.3 (L) 09/25/2018 0505   CALCIUM 9.8 07/09/2011 0819   PROT 6.9 07/28/2018 1043   ALBUMIN 4.4 07/28/2018 1043   AST 19 07/28/2018 1043   ALT 20 07/28/2018 1043   ALKPHOS 61 07/28/2018 1043   BILITOT 0.7 07/28/2018 1043   GFRNONAA >60 09/25/2018 0505   GFRAA >60 09/25/2018 0505       Component Value Date/Time   WBC 15.5 (H) 09/26/2018 0414   RBC 3.60 (L) 09/26/2018 0414   HGB 11.3 (L) 09/26/2018 0414   HCT 35.3 (L) 09/26/2018 0414   PLT 197 09/26/2018 0414   MCV 98.1 09/26/2018 0414   MCH 31.4 09/26/2018 0414   MCHC 32.0 09/26/2018 0414   RDW 13.3 09/26/2018 0414    LYMPHSABS 2.2 07/28/2018 1043   MONOABS 0.8 07/28/2018 1043   EOSABS 0.2 07/28/2018 1043   BASOSABS 0.1 07/28/2018 1043    No results found for: POCLITH, LITHIUM   No results found for: PHENYTOIN, PHENOBARB, VALPROATE, CBMZ   .res Assessment: Plan:    Bryan Tyler was seen today for anxiety, sleeping problem and medication problem.  Diagnoses and all orders for this visit:  Recurrent major depressive disorder, in full remission ([place]) -     ARIPiprazole (ABILIFY) 2 MG tablet; Take 1 tablet (2 mg total) by mouth daily. -     venlafaxine XR (EFFEXOR-XR) 150 MG 24 hr capsule; Take 2 capsules (300 mg total) by mouth daily with breakfast.  Generalized anxiety disorder -     busPIRone (BUSPAR) 15 MG tablet; Take 1 tablet (15 mg total) by mouth 2 (two) times daily.  Insomnia due to mental condition -     gabapentin (NEURONTIN) 800 MG tablet; Take 1 tablet (800 mg total) by mouth at bedtime. -     clonazePAM (KLONOPIN) 1 MG tablet; Take 1 tablet (1 mg total) by mouth at bedtime as needed for anxiety.  Tremor of unknown origin    Greater than 50% of face to face time with patient was spent on counseling and coordination of care. We discussed that Bryan Tyler has a long history of depression and anxiety the has been fairly well controlled in recent years.  He is  on the maximum dosage of venlafaxine and when he had a recurrence of depression in 2017, aripiprazole 2 mg was added and then increased within a month or so to 5 mg daily.  He has been on that same dosage since then and his depression has been in control and in remission.  His anxiety is residual and sometimes affects sleep but that is controlled by the combination of gabapentin and clonazepam and buspirone.  He asked about the purpose of each of the medications that he is taking including those from other medical providers.  These were discussed as well as the side effects.  It is likely that the dizziness which is orthostatic to the dizziness  that he is having is coming from 1 of the blood pressure meds.  Suggest he discuss this with his primary care doctor.  He has seen a neurologist and been told that the tremor which she has is not Parkinson's disease which runs in his family.  We discussed the possibility of reducing the Abilify to see if it is contributing to his tremor.  Given the length of time which she has been stable it is reasonable especially given his age to reduce the dosage from 5 mg daily to 2 mg daily.  We discussed the risk of relapse of depression and anxiety including that it may not occur within the first 2 months or so of the reduction.  He will call us if he has any problems with the reduction in medications. It is possible as well that the high dosage of the venlafaxine could be contributing to the tremor.  We discussed the short-term risks associated with benzodiazepines including sedation and increased fall risk among others.  Discussed long-term side effect risk including dependence, potential withdrawal symptoms, and the potential eventual dose-related risk of dementia. He does not feel like he can do with any less clonazepam.  No other med changes today except the reduction in Abilify to 2 mg daily.  This appointment was 30 minutes  Follow-up 4 to 6 months or sooner as needed  Bryan Parents, MD, DFAPA Please see After Visit Summary for patient specific instructions. Reduce aripiprazole from 1/2 of 10 mg daily to 1 of 2 mg tablets each morning to see if tremor is better. Call if there's any worsening of depression or anxiety.  Future Appointments  Date Time Provider [place]  03/03/2019  9:30 AM [facility] 1 [facility]  03/03/2019 11:00 AM Grace Isaac, MD [facility]  07/19/2019  2:00 PM Cottle, Billey Co., MD [facility] None    No orders of the defined types were placed in this encounter.   -------------------------------

## 2019-01-31 NOTE — Patient Instructions (Signed)
Reduce aripiprazole from 1/2 of 10 mg daily to 1 of 2 mg tablets each morning to see if tremor is better. Call if there's any worsening of depression or anxiety.

## 2019-03-02 NOTE — Progress Notes (Signed)
[address]       [place] [address]             [phone]                    Bryan Tyler [place] Medical Record #[id number] Date of Birth: 25-Jun-1946  Referring: Lacretia Leigh, MD Primary Care: Brunetta Jeans, PA-C Primary Cardiology:Jayadeep Irish Lack, MD Chief Complaint:    Chief Complaint  Patient presents with  . Thoracic Aortic Aneurysm    1 year f/u with Chest CTA    History of Present Illness: Patient returns to the office today for reevaluation of an incidental finding of a 4.2 cm ascending aortic dilatation.  The patient was working around his house and fell off a 5 foot wall , he was standing approximately 3 rungs up and fractured ribs on the left lower chest.   Echocardiogram 2017 showed a trileaflet aortic valve.   Since last seen the patient has had his right shoulder replaced and right knee replacement.  The patient's CT scans have been noted extensive coronary calcification, but no definite symptoms of coronary artery disease.  He does note occasional episodes of "indigestion", not exertionally related   On close questioning the patient has no family history of aortic dissection aortic aneurysms or sudden death at a premature age from unexplained causes.   .  Current Activity/ Functional Status:  Patient is independent with mobility/ambulation, transfers, ADL's, IADL's.   Zubrod Score: At the time of surgery this patient's most appropriate activity status/level should be described as: [x]     0    Normal activity, no symptoms []     1    Restricted in physical strenuous activity but ambulatory, able to do out light work []     2    Ambulatory and capable of self care, unable to do work activities, up and about               >50 % of waking hours                              []     3    Only limited self care, in bed greater than 50% of waking hours []     4    Completely disabled, no self care, confined to bed or chair []     5     Moribund   Past Medical History:  Diagnosis Date  . Adenocarcinoma of prostate ([facility]) 04/16/2013  . Aneurysm, aorta, thoracic ([facility])   . Anxiety   . Arthritis    "knees" (05/21/2017)  . Depression   . Diverticulosis   . Dyspnea    on exertion for a long period time  . GERD (gastroesophageal reflux disease)   . Hyperlipidemia   . Hypertension   . Hypothyroidism, postradioiodine therapy    1980's  . Nocturia   . Organic impotence   . OSA (obstructive sleep apnea)    NO CPAP--  S/P SURGERY  2002  . Osteoporosis   . Pneumonia 1970  . PONV (postoperative nausea and vomiting)   . Urge urinary incontinence   . Wears contact lenses     Past Surgical History:  Procedure Laterality Date  . HEMORRHOIDECTOMY WITH HEMORRHOID BANDING  2007  . INGUINAL HERNIA REPAIR Bilateral    x4  (2 each side)  . KNEE ARTHROSCOPY Right 2012  . NASAL  SEPTUM SURGERY  1982   w/ rhinoplasty  . PROSTATE BIOPSY    . RADIOACTIVE SEED IMPLANT N/A 05/30/2013   Procedure: RADIOACTIVE SEED IMPLANT;  Surgeon: Hanley Ben, MD;  Location: [facility];  Service: Urology;  Laterality: N/A;  . REVERSE SHOULDER ARTHROPLASTY Right 05/21/2017  . REVERSE SHOULDER ARTHROPLASTY Right 05/21/2017  . REVERSE SHOULDER ARTHROPLASTY Right 05/21/2017   Procedure: REVERSE RIGHT SHOULDER ARTHROPLASTY;  Surgeon: Justice Britain, MD;  Location: [facility];  Service: Orthopedics;  Laterality: Right;  . RHINOPLASTY  1982   w/w/ septoplasty  . SHOULDER ARTHROSCOPY W/ ROTATOR CUFF REPAIR Right 2004  . SHOULDER ARTHROSCOPY W/ ROTATOR CUFF REPAIR Left 2016  . TONSILLECTOMY  AS CHILD  . TOTAL KNEE ARTHROPLASTY Right 09/24/2018   Procedure: TOTAL KNEE ARTHROPLASTY;  Surgeon: Sydnee Cabal, MD;  Location: [facility];  Service: Orthopedics;  Laterality: Right;  Adductor Block  . UVULOPALATOPHARYNGOPLASTY  2002    Family History  Problem Relation Age of Onset  . Cancer Mother        Pancreatic Cancer  . Heart disease  Father 55       CABG    Social History   Socioeconomic History  . Marital status: Married    Spouse name: Not on file  . Number of children: Not on file  . Years of education: Not on file  . Highest education level: Not on file  Occupational History  . Occupation: Patent examiner  Social Needs  . Financial resource strain: Not on file  . Food insecurity:    Worry: Not on file    Inability: Not on file  . Transportation needs:    Medical: Not on file    Non-medical: Not on file  Tobacco Use  . Smoking status: Former Smoker    Packs/day: 1.00    Years: 20.00    Pack years: 20.00    Types: Cigarettes    Last attempt to quit: 08/11/1988    Years since quitting: 29.5  . Smokeless tobacco: Never Used  Substance and Sexual Activity  . Alcohol use: Yes    Comment: 05/21/2017 "1-2 beers/month"  . Drug use: No  . Sexual activity: Not Currently  Lifestyle  . Physical activity:    Days per week: Not on file    Minutes per session: Not on file  . Stress: Not on file  Relationships  . Social connections:    Talks on phone: Not on file    Gets together: Not on file    Attends religious service: Not on file    Active member of club or organization: Not on file    Attends meetings of clubs or organizations: Not on file    Relationship status: Not on file  . Intimate partner violence:    Fear of current or ex partner: Not on file    Emotionally abused: Not on file    Physically abused: Not on file    Forced sexual activity: Not on file  Other Topics Concern  . Not on file  Social History Narrative  . Not on file    Social History   Tobacco Use  Smoking Status Former Smoker  . Packs/day: 1.00  . Years: 20.00  . Pack years: 20.00  . Types: Cigarettes  . Quit date: 08/11/1988  . Years since quitting: 30.5  Smokeless Tobacco Never Used    Social History   Substance and Sexual Activity  Alcohol Use Yes   Comment: 05/21/2017 "1-2 beers/month"  Allergies   Allergen Reactions  . Codeine Nausea And Vomiting and Other (See Comments)    DIZZINESS  . Ciprofloxacin Other (See Comments)    Unknown    Current Outpatient Medications  Medication Sig Dispense Refill  . amLODipine (NORVASC) 2.5 MG tablet Take 2.5 mg by mouth daily.    . ARIPiprazole (ABILIFY) 2 MG tablet Take 1 tablet (2 mg total) by mouth daily. 90 tablet 1  . aspirin EC 325 MG tablet Take 1 tablet (325 mg total) by mouth 2 (two) times daily. 60 tablet 11  . atorvastatin (LIPITOR) 40 MG tablet TAKE 1 TABLET EVERY EVENING (Patient taking differently: Take 40 mg by mouth every evening. ) 90 tablet 2  . busPIRone (BUSPAR) 15 MG tablet Take 1 tablet (15 mg total) by mouth 2 (two) times daily. 180 tablet 1  . clonazePAM (KLONOPIN) 1 MG tablet Take 1 tablet (1 mg total) by mouth at bedtime as needed for anxiety. 90 tablet 1  . gabapentin (NEURONTIN) 800 MG tablet Take 1 tablet (800 mg total) by mouth at bedtime. 90 tablet 1  . LEVOXYL 112 MCG tablet TAKE 1 TABLET DAILY 90 tablet 1  . losartan (COZAAR) 100 MG tablet TAKE 1 TABLET(100 MG) BY MOUTH DAILY 90 tablet 1  . methocarbamol (ROBAXIN) 500 MG tablet Take 1 tablet (500 mg total) by mouth every 8 (eight) hours as needed for muscle spasms. 50 tablet 1  . Multiple Vitamin (MULTIVITAMIN WITH MINERALS) TABS tablet Take 1 tablet by mouth daily.    Marland Kitchen omeprazole (PRILOSEC) 40 MG capsule Take 1 capsule (40 mg total) by mouth daily as needed (heartburn). 90 capsule 1  . oxymetazoline (ANEFRIN NASAL SPRAY) 0.05 % nasal spray Place 2 sprays into both nostrils 3 (three) times daily as needed for congestion.     . tamsulosin (FLOMAX) 0.4 MG CAPS capsule Take 1 capsule (0.4 mg total) by mouth daily. (Patient taking differently: Take 0.4 mg by mouth every evening. ) 30 capsule 3  . venlafaxine XR (EFFEXOR-XR) 150 MG 24 hr capsule Take 2 capsules (300 mg total) by mouth daily with breakfast. 180 capsule 1   No current facility-administered medications for  this visit.       Review of Systems:  Review of Systems  Constitutional: Negative.  Negative for chills, diaphoresis, fever, malaise/fatigue and weight loss.  HENT: Negative.   Eyes: Negative.   Respiratory: Negative.  Negative for cough, hemoptysis, sputum production, shortness of breath and wheezing.   Cardiovascular: Negative.   Gastrointestinal: Positive for heartburn. Negative for abdominal pain, blood in stool, diarrhea, melena, nausea and vomiting.  Genitourinary: Negative.   Musculoskeletal: Positive for joint pain. Negative for back pain, falls, myalgias and neck pain.  Skin: Negative.   Neurological: Negative.   Endo/Heme/Allergies: Negative.   Psychiatric/Behavioral: Negative.     Immunizations: Flu up to date Blue.Reese  ]; Pneumococcal up to date [ y ];   Physical Exam: BP 117/77   Pulse 98   Temp 97.7 F (36.5 C) (Skin)   Resp 20   Ht 5\' 10"  (1.778 m)   Wt 199 lb (90.3 kg)   SpO2 95% Comment: RA  BMI 28.55 kg/m   PHYSICAL EXAMINATION: General appearance: alert, cooperative and no distress Head: Normocephalic, without obvious abnormality, atraumatic Neck: no adenopathy, no carotid bruit, no JVD, supple, symmetrical, trachea midline and thyroid not enlarged, symmetric, no tenderness/mass/nodules Lymph nodes: Cervical, supraclavicular, and axillary nodes normal. Resp: clear to auscultation bilaterally Cardio: regular rate and rhythm,  S1, S2 normal, no murmur, click, rub or gallop GI: soft, non-tender; bowel sounds normal; no masses,  no organomegaly Extremities: extremities normal, atraumatic, no cyanosis or edema and Homans sign is negative, no sign of DVT Neurologic: Grossly normal   PATIENT HAS NO STIGMATA OF MARFAN'S HAS PALPABLE PT AND DP PULSES BILATERALLY   Diagnostic Studies & Laboratory data:     Recent Radiology Findings: Ct Angio Chest Aorta W &/or Wo Contrast  Result Date: 03/03/2019 CLINICAL DATA:  Thoracic aortic aneurysm, follow-up EXAM: CT  ANGIOGRAPHY CHEST WITH CONTRAST TECHNIQUE: Multidetector CT imaging of the chest was performed using the standard protocol during bolus administration of intravenous contrast. Multiplanar CT image reconstructions and MIPs were obtained to evaluate the vascular anatomy. CONTRAST:  63mL ISOVUE-370 IOPAMIDOL (ISOVUE-370) INJECTION 76% COMPARISON:  05/06/2018 FINDINGS: Cardiovascular: Early dilatation of the ascending thoracic aorta proximally, 4 cm maximally. Heart is borderline in size. No evidence of aortic dissection. Coronary artery calcifications best seen in the left anterior descending coronary artery. Mediastinum/Nodes: No mediastinal, hilar, or axillary adenopathy. Trachea and esophagus are unremarkable. Lungs/Pleura: Peripheral interstitial thickening and ground-glass opacities compatible with chronic interstitial lung disease. Mild bronchiectasis, stable. No effusions or acute opacities. Upper Abdomen: Gallstones noted within the gallbladder. Musculoskeletal: Chest wall soft tissues are unremarkable. No acute bony abnormality. Review of the MIP images confirms the above findings. IMPRESSION: Early aneurysmal dilatation of the proximal ascending thoracic aorta, 4 cm maximally. Recommend annual imaging followup by CTA or MRA. This recommendation follows 2010 ACCF/AHA/AATS/ACR/ASA/SCA/SCAI/SIR/STS/SVM Guidelines for the Diagnosis and Management of Patients with Thoracic Aortic Disease. Circulation. 2010; 121: E720-N470. Aortic aneurysm NOS (ICD10-I71.9) Coronary artery disease. Chronic interstitial lung disease/fibrosis with bronchiectasis in the lungs. No change. No acute cardiopulmonary disease. Electronically Signed   By: Rolm Baptise M.D.   On: 03/03/2019 09:50   I have independently reviewed the above radiology studies  and reviewed the findings with the patient.   Ct Angio Chest Aorta W/cm &/or Wo/cm  Result Date: 02/22/2018 CLINICAL DATA:  Followup thoracic aortic aneurysm. EXAM: CT ANGIOGRAPHY  CHEST WITH CONTRAST TECHNIQUE: Multidetector CT imaging of the chest was performed using the standard protocol during bolus administration of intravenous contrast. Multiplanar CT image reconstructions and MIPs were obtained to evaluate the vascular anatomy. CONTRAST:  27mL ISOVUE-370 IOPAMIDOL (ISOVUE-370) INJECTION 76% COMPARISON:  01/29/2017 FINDINGS: Cardiovascular: Normal. No pericardial effusion. Ascending thoracic aortic aneurysm measures 4.1 cm, image 75/4 and is unchanged from previous exam. The transverse aortic arch measures 2.7 cm, image 109/11. The posterior arch measures 2.3 cm, image 115/11. At the level of the hiatus the thoracic aorta measures 2.7 cm. Calcification noted within the LAD coronary artery. Mediastinum/Nodes: The trachea appears patent and is midline. Normal appearance of the esophagus. No enlarged mediastinal or hilar lymph nodes. Lungs/Pleura: No pleural effusions. Bilateral areas of interstitial reticulation and patchy ground-glass attenuation with traction bronchiectasis noted. This appears progressive when compared with 12/22/2015 and is suggestive of chronic interstitial lung disease. There is a 4 mm nodule within the right lower lobe and is unchanged from 12/22/2015. Upper Abdomen: No acute findings identified within the upper abdomen. Gallstones noted. Musculoskeletal: Scoliosis and spondylosis noted. No aggressive lytic or sclerotic bone lesions. Review of the MIP images confirms the above findings. IMPRESSION: 1. Stable mild ascending thoracic aortic aneurysm. Recommend annual imaging followup by CTA or MRA. This recommendation follows 2010 ACCF/AHA/AATS/ACR/ASA/SCA/SCAI/SIR/STS/SVM Guidelines for the Diagnosis and Management of Patients with Thoracic Aortic Disease. Circulation. 2010; 121: J628-Z662 2. Persistent and progressive chronic interstitial  changes within both lungs which may reflect early usual interstitial pneumonia (UIP) or nonspecific interstitial pneumonia. 3. Lad  coronary artery atherosclerotic calcifications 4. Gallstones. Electronically Signed   By: Kerby Moors M.D.   On: 02/22/2018 15:17   Ct Angio Chest Aorta W/cm &/or Wo/cm  Result Date: 01/29/2017 CLINICAL DATA:  Thoracic aortic aneurysm without rupture. EXAM: CT ANGIOGRAPHY CHEST WITH CONTRAST TECHNIQUE: Multidetector CT imaging of the chest was performed using the standard protocol during bolus administration of intravenous contrast. Multiplanar CT image reconstructions and MIPs were obtained to evaluate the vascular anatomy. CONTRAST:  75 mL of Isovue 370 intravenously. COMPARISON:  CT scan of Dec 22, 2015. FINDINGS: Cardiovascular: 4.2 cm ascending thoracic aortic aneurysm is noted. No dissection is noted. Transverse aortic arch measures 2.7 cm. Proximal descending thoracic aorta measures 2.5 cm. No pericardial effusion is noted. Mediastinum/Nodes: No enlarged mediastinal, hilar, or axillary lymph nodes. Thyroid gland, trachea, and esophagus demonstrate no significant findings. Lungs/Pleura: No pneumothorax or pleural effusion is noted. Mild bronchiectasis is noted in both lower lobes with stable bibasilar peripheral scarring. No acute abnormality is noted. Upper Abdomen: Cholelithiasis is noted without inflammation. Stable left renal cyst. Musculoskeletal: No chest wall abnormality. No acute or significant osseous findings. Review of the MIP images confirms the above findings. IMPRESSION: Grossly stable 4.2 cm ascending thoracic aortic aneurysm. Recommend annual imaging followup by CTA or MRA. This recommendation follows 2010 ACCF/AHA/AATS/ACR/ASA/SCA/SCAI/SIR/STS/SVM Guidelines for the Diagnosis and Management of Patients with Thoracic Aortic Disease. Circulation. 2010; 121: Z329-J242. Cholelithiasis without inflammation. Aortic aneurysm NOS (ICD10-I71.9). Electronically Signed   By: Marijo Conception, M.D.   On: 01/29/2017 14:07      Ct Chest W Contrast  12/22/2015  CLINICAL DATA:  73 year old male with  fall EXAM: CT CHEST, ABDOMEN, AND PELVIS WITH CONTRAST TECHNIQUE: Multidetector CT imaging of the chest, abdomen and pelvis was performed following the standard protocol during bolus administration of intravenous contrast. CONTRAST:  122mL ISOVUE-300 IOPAMIDOL (ISOVUE-300) INJECTION 61% COMPARISON:  Abdominal MRI dated 03/02/2011 FINDINGS: CT CHEST There is mild diffuse interstitial coarsening with areas of atelectasis/ scarring at the lung bases. There is no focal consolidation, pleural effusion, or pneumothorax. An accessory azygos fissure noted. The central airways are patent. There is mild dilatation of the ascending aorta measuring 4.3 cm in diameter. The thoracic aorta is otherwise unremarkable. No dissection or traumatic injury. The origins of the great vessels of the aortic arch appear patent. The central pulmonary arteries appear patent. There is no cardiomegaly or pericardial effusion. There is coronary vascular calcification. There is no hilar or mediastinal adenopathy. The esophagus is grossly unremarkable. The thyroid nodule is not visualized. There is no axillary adenopathy. The chest wall soft tissues appear unremarkable. There is osteopenia. There is minimally displaced fracture of the left posterior ninth rib. Nondisplaced fractures of the left posterior tenth and eleventh ribs noted. Right shoulder rotator cuff surgical pinning noted. CT ABDOMEN AND PELVIS No intra-abdominal free air or free fluid. There stone within the gallbladder. No pericholecystic fluid. Ultrasound may provide better evaluation of the gallbladder if clinically indicated. The liver, pancreas, spleen, and the adrenal glands appear unremarkable. Multiple left renal hypodense lesions measuring up to 4.2 cm in the upper pole of the left kidney. The larger lesions measure cyst. The smaller lesions are too small to characterize but however seen on the prior MRI and described as cysts. The right kidney is unremarkable. There is no  hydronephrosis on either side. The visualized ureters and urinary bladder appear  unremarkable. Prostate brachytherapy seeds noted. There is sigmoid diverticulosis without acute inflammatory changes. Constipation. There is no evidence of bowel obstruction or active inflammation. Normal appendix. The abdominal aorta and IVC appear unremarkable. No portal venous gas identified. There is no adenopathy. The abdominal wall soft tissues appear unremarkable. There is degenerative changes of the spine. Old healed left pubic bone fracture deformity. No acute fracture. IMPRESSION: Fractures of the left posterior 9-11th ribs. No pneumothorax. No other acute/traumatic intrathoracic, abdominal, or pelvic pathology identified. A 4.3 cm dilatation of the ascending aorta. Recommend annual imaging followup by CTA or MRA. This recommendation follows 2010 ACCF/AHA/AATS/ACR/ASA/SCA/SCAI/SIR/STS/SVM Guidelines for the Diagnosis and Management of Patients with Thoracic Aortic Disease. Circulation. 2010; 121: K481-E563 Electronically Signed   By: Anner Crete M.D.   On: 12/22/2015 22:39     Ct Cervical Spine Wo Contrast  12/22/2015  CLINICAL DATA:  Fall today.  Neck Pain EXAM: CT CERVICAL SPINE WITHOUT CONTRAST TECHNIQUE: Multidetector CT imaging of the cervical spine was performed without intravenous contrast. Multiplanar CT image reconstructions were also generated. COMPARISON:  None. FINDINGS: Disc degeneration and spondylosis C4-5 and C5-6. 2 mm anterior slip C7-T1 due to advanced facet degeneration on the left. Right-sided facet degeneration C2-3 and C3-4. Negative for cervical spine fracture. No mass or adenopathy in the neck. Lung apices clear. IMPRESSION: Cervical spine degenerative change.  No acute fracture. Electronically Signed   By: Franchot Gallo M.D.   On: 12/22/2015 22:13   Ct Abdomen Pelvis W Contrast  12/22/2015  CLINICAL DATA:  73 year old male with fall EXAM: CT CHEST, ABDOMEN, AND PELVIS WITH CONTRAST  TECHNIQUE: Multidetector CT imaging of the chest, abdomen and pelvis was performed following the standard protocol during bolus administration of intravenous contrast. CONTRAST:  188mL ISOVUE-300 IOPAMIDOL (ISOVUE-300) INJECTION 61% COMPARISON:  Abdominal MRI dated 03/02/2011 FINDINGS: CT CHEST There is mild diffuse interstitial coarsening with areas of atelectasis/ scarring at the lung bases. There is no focal consolidation, pleural effusion, or pneumothorax. An accessory azygos fissure noted. The central airways are patent. There is mild dilatation of the ascending aorta measuring 4.3 cm in diameter. The thoracic aorta is otherwise unremarkable. No dissection or traumatic injury. The origins of the great vessels of the aortic arch appear patent. The central pulmonary arteries appear patent. There is no cardiomegaly or pericardial effusion. There is coronary vascular calcification. There is no hilar or mediastinal adenopathy. The esophagus is grossly unremarkable. The thyroid nodule is not visualized. There is no axillary adenopathy. The chest wall soft tissues appear unremarkable. There is osteopenia. There is minimally displaced fracture of the left posterior ninth rib. Nondisplaced fractures of the left posterior tenth and eleventh ribs noted. Right shoulder rotator cuff surgical pinning noted. CT ABDOMEN AND PELVIS No intra-abdominal free air or free fluid. There stone within the gallbladder. No pericholecystic fluid. Ultrasound may provide better evaluation of the gallbladder if clinically indicated. The liver, pancreas, spleen, and the adrenal glands appear unremarkable. Multiple left renal hypodense lesions measuring up to 4.2 cm in the upper pole of the left kidney. The larger lesions measure cyst. The smaller lesions are too small to characterize but however seen on the prior MRI and described as cysts. The right kidney is unremarkable. There is no hydronephrosis on either side. The visualized ureters and  urinary bladder appear unremarkable. Prostate brachytherapy seeds noted. There is sigmoid diverticulosis without acute inflammatory changes. Constipation. There is no evidence of bowel obstruction or active inflammation. Normal appendix. The abdominal aorta and IVC appear unremarkable.  No portal venous gas identified. There is no adenopathy. The abdominal wall soft tissues appear unremarkable. There is degenerative changes of the spine. Old healed left pubic bone fracture deformity. No acute fracture. IMPRESSION: Fractures of the left posterior 9-11th ribs. No pneumothorax. No other acute/traumatic intrathoracic, abdominal, or pelvic pathology identified. A 4.3 cm dilatation of the ascending aorta. Recommend annual imaging followup by CTA or MRA. This recommendation follows 2010 ACCF/AHA/AATS/ACR/ASA/SCA/SCAI/SIR/STS/SVM Guidelines for the Diagnosis and Management of Patients with Thoracic Aortic Disease. Circulation. 2010; 121: F751-W258 Electronically Signed   By: Anner Crete M.D.   On: 12/22/2015 22:39   Stress test: Study Highlights    Nuclear stress EF: 57%.  There was no ST segment deviation noted during stress.  The study is normal.  This is a low risk study.  The left ventricular ejection fraction is normal (55-65%).   Normal resting and stress perfusion. No ischemia or infarction EF 57%   Nuclear History and Indications  History and Indications Indication for Stress Test:  Diagnosis of coronary disease and surgery clearance History:  No known CAD Cardiac Risk Factors: Family History - CAD, Hypertension and Lipids  Symptoms:  DOE and Light-Headedness  Stress Findings  ECG Baseline ECG exhibits normal sinus rhythm..  Stress Findings A pharmacological stress test was performed using IV Lexiscan 0.4mg  over 10 seconds performed without concurrent submaximal exercise.   The patient reported shortness of breath and flushing during the stress test. The patient experienced no  angina during the stress test.   Test was stopped per protocol.    Recovery time:  5 minutes.  Response to Stress There was no ST segment deviation noted during stress.  Arrhythmias during stress:  none.   Arrhythmias during recovery:  none.     There were no significant arrhythmias noted during the test.   ECG was uninterpretable.  Stress Measurements  Baseline Vitals  Rest HR 65 bpm    Rest BP 129/83 mmHg    Peak Stress Vitals  Peak HR 90 bpm    Peak BP 122/70 mmHg       Nuclear Stress Measurements  LV sys vol 36 mL    TID 1.06     LV dias vol 84 mL    SSS 0     SRS 0     SDS 0          Nuclear Stress Findings  Isotope administration Rest isotope was administered  with an IV injection of 10.7 mCi Tc75m Tetrofosmin.  Rest SPECT images were obtained approximately 45 minutes post tracer injection.  Stress isotope was administered  with an IV injection of 31.8 mCi Tc84m [employer]rofosmin   Stress SPECT images were obtained approximately 60 minutes post tracer injection.  Nuclear Study Quality Overall image quality is good.  Nuclear Measurements Study was gated.  Perfusion Summary Normal resting and stress perfusion. No ischemia or infarction EF 57%  Overall Study Impression Myocardial perfusion is normal.   The study is normal.   This is a low risk study.  Overall left ventricular systolic function was normal.    LV cavity size is normal.  Nuclear stress EF:  57%.  The left ventricular ejection fraction is normal (55-65%).   From: ACCF/SCAI/STS/AATS/AHA/ASNC/HFSA/SCCT 2012 Appropriate Use Criteria for Coronary Revascularization Focused Update  Wall Scoring  Score Index: 1.000 Percent Normal: 100.0%           The left ventricular wall motion is normal.  Resulted by:  Signed Date/Time  Phone Pager  Josue Hector 08/18/2018 3:22 PM [phone]            echo: *[facility]*                          [address] [place], [place] [address]                            [phone]  ------------------------------------------------------------------- Echocardiography  Patient:    Tymarion, Everard MR #:       [id number] Study Date: 01/30/2016 Gender:     M Age:        75 Height:     177.8 cm Weight:     83.9 kg BSA:        2.05 m^2 Pt. Status: Room:   ATTENDING    Lanelle Bal MD  Adeline MD  [place] MD  SONOGRAPHER  [place]  cc:  ------------------------------------------------------------------- LV EF: 60% -   65%  ------------------------------------------------------------------- History:   PMH:  Former Smoker, GERD. Preoperative Clearance.  Risk factors:  Hypertension. Dyslipidemia.  ------------------------------------------------------------------- Study Conclusions  - Left ventricle: The cavity size was normal. There was moderate   concentric hypertrophy. Systolic function was normal. The   estimated ejection fraction was in the range of 60% to 65%. Wall   motion was normal; there were no regional wall motion   abnormalities. Doppler parameters are consistent with abnormal   left ventricular relaxation (grade 1 diastolic dysfunction).   Doppler parameters are consistent with indeterminate ventricular   filling pressure. - Aortic valve: Transvalvular velocity was within the normal range.   There was no stenosis. There was mild regurgitation.   Regurgitation pressure half-time: 586 ms. - Mitral valve: Transvalvular velocity was within the normal range.   There was no evidence for stenosis. There was no regurgitation. - Right ventricle: The cavity size was normal. Wall thickness was   normal. Systolic function was normal. - Tricuspid valve: There was mild regurgitation. - Pulmonary arteries: Systolic pressure was within the normal   range. PA  peak pressure: 29 mm Hg (S).  Echocardiography.  M-mode, complete 2D, spectral Doppler, and color Doppler.  Birthdate:  Patient birthdate: Jul 18, 1946.  Age:  Patient is 73 yr old.  Sex:  Gender: male.    BMI: 26.5 kg/m^2.  Patient status:  Outpatient.  Study date:  Study date: 01/30/2016. Study time: 08:45 AM.  Location:  Echo laboratory.  -------------------------------------------------------------------  ------------------------------------------------------------------- Left ventricle:  The cavity size was normal. There was moderate concentric hypertrophy. Systolic function was normal. The estimated ejection fraction was in the range of 60% to 65%. Wall motion was normal; there were no regional wall motion abnormalities. Doppler parameters are consistent with abnormal left ventricular relaxation (grade 1 diastolic dysfunction). Doppler parameters are consistent with indeterminate ventricular filling pressure.  ------------------------------------------------------------------- Aortic valve:   Trileaflet; normal thickness leaflets. Mobility was not restricted.  Doppler:  Transvalvular velocity was within the normal range. There was no stenosis. There was mild regurgitation.   ------------------------------------------------------------------- Aorta:  Aortic root: The aortic root was normal in size. Ascending aorta: The ascending aorta was mildly dilated.  ------------------------------------------------------------------- Mitral valve:   Structurally normal valve.   Mobility was not restricted.  Doppler:  Transvalvular velocity was within the normal range. There was no evidence for stenosis. There was no regurgitation.  ------------------------------------------------------------------- Left atrium:  The atrium was normal in size.  ------------------------------------------------------------------- Right ventricle:  The cavity size was normal. Wall thickness  was normal. Systolic function was normal.  ------------------------------------------------------------------- Pulmonic valve:    Structurally normal valve.   Cusp separation was normal.  Doppler:  Transvalvular velocity was within the normal range. There was no evidence for stenosis. There was trivial regurgitation.  ------------------------------------------------------------------- Tricuspid valve:   Structurally normal valve.    Doppler: Transvalvular velocity was within the normal range. There was mild regurgitation.  ------------------------------------------------------------------- Pulmonary artery:   The main pulmonary artery was normal-sized. Systolic pressure was within the normal range.  ------------------------------------------------------------------- Right atrium:  The atrium was normal in size.  ------------------------------------------------------------------- Pericardium:  There was no pericardial effusion.  ------------------------------------------------------------------- Systemic veins: Inferior vena cava: The vessel was normal in size. The respirophasic diameter changes were in the normal range (>= 50%), consistent with normal central venous pressure.  ------------------------------------------------------------------- Measurements   Left ventricle                           Value        Reference  LV ID, ED, PLAX chordal          (L)     34.4  mm     43 - 52  LV ID, ES, PLAX chordal          (L)     22.3  mm     23 - 38  LV fx shortening, PLAX chordal           35    %      >=29  LV PW thickness, ED                      15.4  mm     ---------  IVS/LV PW ratio, ED                      1.03         <=1.3  LV e&', lateral                           7.94  cm/s   ---------  LV E/e&', lateral                         5.5          ---------  LV e&', medial                            4.35  cm/s   ---------  LV E/e&', medial                           10.05        ---------  LV e&', average                           6.15  cm/s   ---------  LV E/e&',  average                         7.11         ---------    Ventricular septum                       Value        Reference  IVS thickness, ED                        15.9  mm     ---------    LVOT                                     Value        Reference  LVOT ID, S                               20    mm     ---------  LVOT area                                3.14  cm^2   ---------    Aortic valve                             Value        Reference  Aortic regurg pressure half-time         586   ms     ---------    Aorta                                    Value        Reference  Aortic root ID, ED                       40    mm     ---------  Ascending aorta ID, A-P                  40.7  mm     ---------    Left atrium                              Value        Reference  LA ID, A-P, ES                           28    mm     ---------  LA ID/bsa, A-P                           1.37  cm/m^2 <=2.2  LA volume, ES, 1-p A4C                   37.7  ml     ---------  LA volume/bsa, ES, 1-p A4C               18.4  ml/m^2 ---------  LA volume, ES, 1-p A2C  38.8  ml     ---------  LA volume/bsa, ES, 1-p A2C               18.9  ml/m^2 ---------    Mitral valve                             Value        Reference  Mitral E-wave peak velocity              43.7  cm/s   ---------  Mitral A-wave peak velocity              72.8  cm/s   ---------  Mitral deceleration time         (H)     356   ms     150 - 230  Mitral E/A ratio, peak                   0.6          ---------    Pulmonary arteries                       Value        Reference  PA pressure, S, DP                       29    mm Hg  <=30    Tricuspid valve                          Value        Reference  Tricuspid regurg peak velocity           254   cm/s   ---------  Tricuspid peak RV-RA gradient            26    mm Hg   ---------    Systemic veins                           Value        Reference  Estimated CVP                            3     mm Hg  ---------    Right ventricle                          Value        Reference  TAPSE                                    19.1  mm     ---------  RV pressure, S, DP                       29    mm Hg  <=30    Pulmonic valve                           Value        Reference  Pulmonic regurg velocity, ED             124   cm/s   ---------  Pulmonic regurg gradient, ED             6     mm Hg  ---------  Legend: (L)  and  (H)  mark values outside specified reference range.  ------------------------------------------------------------------- Prepared and Electronically Authenticated by  Skeet Latch, MD 2017-06-21T09:59:22       Recent Lab Findings: Lab Results  Component Value Date   WBC 15.5 (H) 09/26/2018   HGB 11.3 (L) 09/26/2018   HCT 35.3 (L) 09/26/2018   PLT 197 09/26/2018   GLUCOSE 137 (H) 09/25/2018   CHOL 140 04/29/2018   TRIG 211.0 (H) 04/29/2018   HDL 49.10 04/29/2018   LDLDIRECT 74.0 04/29/2018   LDLCALC 100 (H) 02/06/2014   ALT 20 07/28/2018   AST 19 07/28/2018   NA 138 09/25/2018   K 4.5 09/25/2018   CL 105 09/25/2018   CREATININE 0.78 09/25/2018   BUN 16 09/25/2018   CO2 27 09/25/2018   TSH 3.26 01/05/2019   INR 0.89 09/22/2018   Aortic Size Index=    4.2     /Body surface area is 2.11 meters squared. = 2.1  < 2.75 cm/m2      4% risk per year 2.75 to 4.25          8% risk per year > 4.25 cm/m2    20% risk per year     Assessment / Plan:  #1 stable 4.2 cm dilatation of the ascending aorta, with a trileaflet aortic valve by echo without sclerosis or stenosis #2. Persistent and progressive chronic interstitial changes within both lungs which may reflect early usual interstitial pneumonia (UIP) or nonspecific interstitial pneumonia. -Patient was evaluated by Dr. Chase Caller patient notes that OPED medication was  recommended but this was declined by his insurance company and was going to cost him $12,000 a month.  Currently he is on no active treatment for interstitial lung disease and notes the symptoms have not worsened.   #3 lad coronary artery atherosclerotic calcifications by CT scan-without signs or symptoms of angina , the patient does note occasional episodes of "indigestion at rest" should the symptoms change she will contact Dr. Irish Lack his cardiologist  #4 gallstones-by CT scan asymptomatic   follow-up CTA of the chest 18 months-to evaluate the size of his aorta   Grace Isaac MD      [address] [place] [address] Office [phone]   Beeper [phone]  03/03/2019 11:18 AM

## 2019-03-03 ENCOUNTER — Ambulatory Visit (INDEPENDENT_AMBULATORY_CARE_PROVIDER_SITE_OTHER): Payer: Medicare Other | Admitting: Cardiothoracic Surgery

## 2019-03-03 ENCOUNTER — Ambulatory Visit
Admission: RE | Admit: 2019-03-03 | Discharge: 2019-03-03 | Disposition: A | Discharge status: Home / Self Care | Payer: Medicare Other | Source: Ambulatory Visit | Attending: Cardiothoracic Surgery | Admitting: Cardiothoracic Surgery

## 2019-03-03 ENCOUNTER — Encounter: Payer: Self-pay | Admitting: Cardiothoracic Surgery

## 2019-03-03 ENCOUNTER — Other Ambulatory Visit: Payer: Self-pay

## 2019-03-03 VITALS — BP 117/77 | HR 98 | Temp 97.7°F | Resp 20 | Ht 70.0 in | Wt 199.0 lb

## 2019-03-03 DIAGNOSIS — I712 Thoracic aortic aneurysm, without rupture, unspecified: Secondary | ICD-10-CM

## 2019-03-03 MED ORDER — IOPAMIDOL (ISOVUE-370) INJECTION 76%
75.0000 mL | Freq: Once | INTRAVENOUS | Status: AC | PRN
  Administered 2019-03-03: 75 mL via INTRAVENOUS

## 2019-03-03 NOTE — Patient Instructions (Signed)

## 2019-03-14 ENCOUNTER — Other Ambulatory Visit: Payer: Self-pay

## 2019-03-14 MED ORDER — ATORVASTATIN CALCIUM 40 MG PO TABS: 40.0000 mg | ORAL_TABLET | Freq: Every evening | ORAL | 1 refills | Status: DC

## 2019-03-21 DIAGNOSIS — Z96651 Presence of right artificial knee joint: Secondary | ICD-10-CM | POA: Diagnosis not present

## 2019-03-21 DIAGNOSIS — Z471 Aftercare following joint replacement surgery: Secondary | ICD-10-CM | POA: Diagnosis not present

## 2019-04-22 ENCOUNTER — Telehealth: Payer: Self-pay | Admitting: Psychiatry

## 2019-04-22 ENCOUNTER — Other Ambulatory Visit: Payer: Self-pay

## 2019-04-22 DIAGNOSIS — F5105 Insomnia due to other mental disorder: Secondary | ICD-10-CM

## 2019-04-22 MED ORDER — CLONAZEPAM 1 MG PO TABS: 1.0000 mg | ORAL_TABLET | Freq: Every evening | ORAL | 0 refills | Status: DC | PRN

## 2019-04-22 NOTE — Telephone Encounter (Signed)
Pt cannot increase the dosage of a controlled substance without discussing with [place].  Will only give 30 day supply

## 2019-04-22 NOTE — Telephone Encounter (Signed)
Last refill 01/31/2019 #90  Pended for approval please review dosage

## 2019-04-22 NOTE — Telephone Encounter (Signed)
No higher dosage given his age than this without disussing it with MD.  Limited to 30 day supply

## 2019-04-22 NOTE — Telephone Encounter (Signed)
Patient need refill on Clonazepam 1 mg to be sent to [facility], patient was taking 1.5mg  stated the 1 mg is not working for him

## 2019-04-25 ENCOUNTER — Other Ambulatory Visit: Payer: Self-pay | Admitting: Physician Assistant

## 2019-04-25 DIAGNOSIS — F5105 Insomnia due to other mental disorder: Secondary | ICD-10-CM

## 2019-05-03 ENCOUNTER — Ambulatory Visit (INDEPENDENT_AMBULATORY_CARE_PROVIDER_SITE_OTHER): Payer: Medicare Other | Admitting: Physician Assistant

## 2019-05-03 ENCOUNTER — Encounter: Payer: Self-pay | Admitting: Physician Assistant

## 2019-05-03 ENCOUNTER — Other Ambulatory Visit: Payer: Self-pay

## 2019-05-03 VITALS — BP 100/62 | HR 99 | Temp 97.6°F | Resp 14 | Ht 69.0 in | Wt 194.0 lb

## 2019-05-03 DIAGNOSIS — E785 Hyperlipidemia, unspecified: Secondary | ICD-10-CM | POA: Diagnosis not present

## 2019-05-03 DIAGNOSIS — R350 Frequency of micturition: Secondary | ICD-10-CM

## 2019-05-03 DIAGNOSIS — R42 Dizziness and giddiness: Secondary | ICD-10-CM

## 2019-05-03 DIAGNOSIS — Z0000 Encounter for general adult medical examination without abnormal findings: Secondary | ICD-10-CM | POA: Diagnosis not present

## 2019-05-03 DIAGNOSIS — I10 Essential (primary) hypertension: Secondary | ICD-10-CM | POA: Diagnosis not present

## 2019-05-03 DIAGNOSIS — K219 Gastro-esophageal reflux disease without esophagitis: Secondary | ICD-10-CM

## 2019-05-03 DIAGNOSIS — E018 Other iodine-deficiency related thyroid disorders and allied conditions: Secondary | ICD-10-CM

## 2019-05-03 LAB — COMPREHENSIVE METABOLIC PANEL
ALT: 19 U/L (ref 0–53)
AST: 21 U/L (ref 0–37)
Albumin: 4.6 g/dL (ref 3.5–5.2)
Alkaline Phosphatase: 61 U/L (ref 39–117)
BUN: 16 mg/dL (ref 6–23)
CO2: 30 mEq/L (ref 19–32)
Calcium: 10 mg/dL (ref 8.4–10.5)
Chloride: 101 mEq/L (ref 96–112)
Creatinine, Ser: 0.93 mg/dL (ref 0.40–1.50)
GFR: 79.29 mL/min (ref >60.00)
Glucose, Bld: 91 mg/dL (ref 70–99)
Potassium: 4.4 mEq/L (ref 3.5–5.1)
Sodium: 138 mEq/L (ref 135–145)
Total Bilirubin: 0.6 mg/dL (ref 0.2–1.2)
Total Protein: 7 g/dL (ref 6.0–8.3)

## 2019-05-03 LAB — LIPID PANEL
Cholesterol: 147 mg/dL (ref 0–200)
HDL: 43.6 mg/dL (ref >39.00)
NonHDL: 103.45
Total CHOL/HDL Ratio: 3
Triglycerides: 212 mg/dL — ABNORMAL HIGH (ref 0.0–149.0)
VLDL: 42.4 mg/dL — ABNORMAL HIGH (ref 0.0–40.0)

## 2019-05-03 LAB — CBC WITH DIFFERENTIAL/PLATELET
Basophils Absolute: 0.1 10*3/uL (ref 0.0–0.1)
Basophils Relative: 0.8 % (ref 0.0–3.0)
Eosinophils Absolute: 0.3 10*3/uL (ref 0.0–0.7)
Eosinophils Relative: 3.4 % (ref 0.0–5.0)
HCT: 44.7 % (ref 39.0–52.0)
Hemoglobin: 15 g/dL (ref 13.0–17.0)
Lymphocytes Relative: 27.8 % (ref 12.0–46.0)
Lymphs Abs: 2.1 10*3/uL (ref 0.7–4.0)
MCHC: 33.5 g/dL (ref 30.0–36.0)
MCV: 93.6 fl (ref 78.0–100.0)
Monocytes Absolute: 0.7 10*3/uL (ref 0.1–1.0)
Monocytes Relative: 9.7 % (ref 3.0–12.0)
Neutro Abs: 4.4 10*3/uL (ref 1.4–7.7)
Neutrophils Relative %: 58.3 % (ref 43.0–77.0)
Platelets: 238 10*3/uL (ref 150.0–400.0)
RBC: 4.77 Mil/uL (ref 4.22–5.81)
RDW: 14.3 % (ref 11.5–15.5)
WBC: 7.5 10*3/uL (ref 4.0–10.5)

## 2019-05-03 LAB — POCT URINALYSIS DIPSTICK
Bilirubin, UA: NEGATIVE
Blood, UA: NEGATIVE
Glucose, UA: NEGATIVE
Ketones, UA: NEGATIVE
Leukocytes, UA: NEGATIVE
Nitrite, UA: NEGATIVE
Protein, UA: NEGATIVE
Spec Grav, UA: 1.02 (ref 1.010–1.025)
Urobilinogen, UA: 0.2 E.U./dL
pH, UA: 6 (ref 5.0–8.0)

## 2019-05-03 LAB — TSH: TSH: 2.87 u[IU]/mL (ref 0.35–4.50)

## 2019-05-03 LAB — LDL CHOLESTEROL, DIRECT: Direct LDL: 73 mg/dL

## 2019-05-03 NOTE — Patient Instructions (Signed)
Please go to the lab today for blood work.  I will call you with your results. We will alter treatment regimen(s) if indicated by your results.   Please decrease the losartan to 1/2 tablet (50 mg) daily. Keep hydrated and continue well balanced diet. Follow-up with [place] in office in 2 weeks so we can reassess BP.  Your urine test looked good today. We are assessing glucose (sugar levels) to make sure no sign of diabetes.    Preventive Care 74 Years and Older, Male Preventive care refers to lifestyle choices and visits with your health care provider that can promote health and wellness. This includes:  A yearly physical exam. This is also called an annual well check.  Regular dental and eye exams.  Immunizations.  Screening for certain conditions.  Healthy lifestyle choices, such as diet and exercise. What can I expect for my preventive care visit? Physical exam Your health care provider will check:  Height and weight. These may be used to calculate body mass index (BMI), which is a measurement that tells if you are at a healthy weight.  Heart rate and blood pressure.  Your skin for abnormal spots. Counseling Your health care provider may ask you questions about:  Alcohol, tobacco, and drug use.  Emotional well-being.  Home and relationship well-being.  Sexual activity.  Eating habits.  History of falls.  Memory and ability to understand (cognition).  Work and work Statistician. What immunizations do I need?  Influenza (flu) vaccine  This is recommended every year. Tetanus, diphtheria, and pertussis (Tdap) vaccine  You may need a Td booster every 10 years. Varicella (chickenpox) vaccine  You may need this vaccine if you have not already been vaccinated. Zoster (shingles) vaccine  You may need this after age 74. Pneumococcal conjugate (PCV13) vaccine  One dose is recommended after age 74. Pneumococcal polysaccharide (PPSV23) vaccine  One dose is  recommended after age 74. Measles, mumps, and rubella (MMR) vaccine  You may need at least one dose of MMR if you were born in 1957 or later. You may also need a second dose. Meningococcal conjugate (MenACWY) vaccine  You may need this if you have certain conditions. Hepatitis A vaccine  You may need this if you have certain conditions or if you travel or work in places where you may be exposed to hepatitis A. Hepatitis B vaccine  You may need this if you have certain conditions or if you travel or work in places where you may be exposed to hepatitis B. Haemophilus influenzae type b (Hib) vaccine  You may need this if you have certain conditions. You may receive vaccines as individual doses or as more than one vaccine together in one shot (combination vaccines). Talk with your health care provider about the risks and benefits of combination vaccines. What tests do I need? Blood tests  Lipid and cholesterol levels. These may be checked every 5 years, or more frequently depending on your overall health.  Hepatitis C test.  Hepatitis B test. Screening  Lung cancer screening. You may have this screening every year starting at age 74 if you have a 30-pack-year history of smoking and currently smoke or have quit within the past 15 years.  Colorectal cancer screening. All adults should have this screening starting at age 74 and continuing until age 74. Your health care provider may recommend screening at age 74 if you are at increased risk. You will have tests every 1-10 years, depending on your results and the  type of screening test.  Prostate cancer screening. Recommendations will vary depending on your family history and other risks.  Diabetes screening. This is done by checking your blood sugar (glucose) after you have not eaten for a while (fasting). You may have this done every 1-3 years.  Abdominal aortic aneurysm (AAA) screening. You may need this if you are a current or former  smoker.  Sexually transmitted disease (STD) testing. Follow these instructions at home: Eating and drinking  Eat a diet that includes fresh fruits and vegetables, whole grains, lean protein, and low-fat dairy products. Limit your intake of foods with high amounts of sugar, saturated fats, and salt.  Take vitamin and mineral supplements as recommended by your health care provider.  Do not drink alcohol if your health care provider tells you not to drink.  If you drink alcohol: ? Limit how much you have to 0-2 drinks a day. ? Be aware of how much alcohol is in your drink. In the [place] one drink equals one 12 oz bottle of beer (355 mL), one 5 oz glass of wine (148 mL), or one 1 oz glass of hard liquor (44 mL). Lifestyle  Take daily care of your teeth and gums.  Stay active. Exercise for at least 30 minutes on 5 or more days each week.  Do not use any products that contain nicotine or tobacco, such as cigarettes, e-cigarettes, and chewing tobacco. If you need help quitting, ask your health care provider.  If you are sexually active, practice safe sex. Use a condom or other form of protection to prevent STIs (sexually transmitted infections).  Talk with your health care provider about taking a low-dose aspirin or statin. What's next?  Visit your health care provider once a year for a well check visit.  Ask your health care provider how often you should have your eyes and teeth checked.  Stay up to date on all vaccines. This information is not intended to replace advice given to you by your health care provider. Make sure you discuss any questions you have with your health care provider. Document Released: 08/24/2015 Document Revised: 07/22/2018 Document Reviewed: 07/22/2018 Elsevier Patient Education  2020 [employer].

## 2019-05-03 NOTE — Telephone Encounter (Signed)
Left a few voice mails to call back

## 2019-05-03 NOTE — Progress Notes (Signed)
Subjective:   Bryan Tyler is a 73 y.o. male who presents for Medicare Annual/Subsequent preventive examination.  Patient endorses over the past 6 months noting excessive thirst and urinary frequency. Steady stream.No hesitancy or suprapubic pressure. + history of prostate Cancer 2014. Has pending urology appointment next month with [facility].  Anxiety and Depression -- Still seeing Dr. Clovis Pu. Endorses taking medications as directed. Follows up every 6 months. Feels stable overall. Denies SI/HI  Hypothyroidism -- s/p RAI ablation. On levothyroxine 112 mcg daily. Endorses taking as directed.  Lab Results  Component Value Date   TSH 3.26 01/05/2019   Hypertension -- Patient is currently on a regimen of losartan 100 mg daily. Endorses taking as directed. Notes occasional lightheadedness with standing. Patient denies chest pain, palpitations, dizziness, vision changes or frequent headaches.  BP Readings from Last 3 Encounters:  05/03/19 100/62  03/03/19 117/77  01/05/19 128/80      Review of Systems:  Review of Systems  Constitutional: Negative for fever and malaise/fatigue.  HENT: Negative for hearing loss.   Eyes: Negative for blurred vision, double vision and photophobia.  Genitourinary: Positive for frequency.  Neurological:       Positional lightheadedness   Psychiatric/Behavioral: Positive for depression (chronic, stable. managed by psychiatry). Negative for hallucinations, substance abuse and suicidal ideas. The patient is nervous/anxious (chronic, stable, managed by psychiatry). The patient does not have insomnia.     Objective:    Vitals: Ht 5\' 9"  (1.753 m)    Wt 194 lb (88 kg)    BMI 28.65 kg/m   Body mass index is 28.65 kg/m.  Advanced Directives 05/03/2019 09/24/2018 09/22/2018 04/29/2018 05/21/2017 05/19/2017 04/14/2017  Does Patient Have a Medical Advance Directive? Yes Yes Yes No Yes Yes No  Type of Paramedic of [place];Living  will [place];Living will [place];Living will - Living will;Healthcare Power of [place] -  Does patient want to make changes to medical advance directive? - No - Patient declined No - Patient declined - No - Patient declined No - Patient declined -  Copy of [place] in Chart? No - copy requested Yes - validated most recent copy scanned in chart (See row information) Yes - validated most recent copy scanned in chart (See row information) - No - copy requested No - copy requested -  Would patient like information on creating a medical advance directive? - - - - - - -  Pre-existing out of facility DNR order (yellow form or pink MOST form) - - - - - - -    Tobacco Social History   Tobacco Use  Smoking Status Former Smoker   Packs/day: 1.00   Years: 20.00   Pack years: 20.00   Types: Cigarettes   Quit date: 08/11/1988   Years since quitting: 30.7  Smokeless Tobacco Never Used     Counseling given: Not Answered   Clinical Intake:  Pre-visit preparation completed: No  Pain : No/denies pain   Diabetes: No  How often do you need to have someone help you when you read instructions, pamphlets, or other written materials from your doctor or pharmacy?: 1 - Never  Interpreter Needed?: No     Past Medical History:  Diagnosis Date   Adenocarcinoma of prostate ([place]) 04/16/2013   Aneurysm, aorta, thoracic ([place])    Anxiety    Arthritis    "knees" (05/21/2017)   Depression    Diverticulosis  Dyspnea    on exertion for a long period time   GERD (gastroesophageal reflux disease)    Hyperlipidemia    Hypertension    Hypothyroidism, postradioiodine therapy    1980's   Nocturia    Organic impotence    OSA (obstructive sleep apnea)    NO CPAP--  S/P SURGERY  2002   Osteoporosis    Pneumonia 1970   PONV (postoperative nausea and vomiting)    Urge urinary incontinence     Wears contact lenses    Past Surgical History:  Procedure Laterality Date   HEMORRHOIDECTOMY WITH HEMORRHOID BANDING  2007   INGUINAL HERNIA REPAIR Bilateral    x4  (2 each side)   KNEE ARTHROSCOPY Right 2012   NASAL SEPTUM SURGERY  1982   w/ rhinoplasty   PROSTATE BIOPSY     RADIOACTIVE SEED IMPLANT N/A 05/30/2013   Procedure: RADIOACTIVE SEED IMPLANT;  Surgeon: Hanley Ben, MD;  Location: [facility];  Service: Urology;  Laterality: N/A;   REVERSE SHOULDER ARTHROPLASTY Right 05/21/2017   REVERSE SHOULDER ARTHROPLASTY Right 05/21/2017   REVERSE SHOULDER ARTHROPLASTY Right 05/21/2017   Procedure: REVERSE RIGHT SHOULDER ARTHROPLASTY;  Surgeon: Justice Britain, MD;  Location: [facility];  Service: Orthopedics;  Laterality: Right;   RHINOPLASTY  1982   w/w/ septoplasty   SHOULDER ARTHROSCOPY W/ ROTATOR CUFF REPAIR Right 2004   SHOULDER ARTHROSCOPY W/ ROTATOR CUFF REPAIR Left 2016   TONSILLECTOMY  AS CHILD   TOTAL KNEE ARTHROPLASTY Right 09/24/2018   Procedure: TOTAL KNEE ARTHROPLASTY;  Surgeon: Sydnee Cabal, MD;  Location: [facility];  Service: Orthopedics;  Laterality: Right;  Adductor Block   UVULOPALATOPHARYNGOPLASTY  2002   Family History  Problem Relation Age of Onset   Cancer Mother        Pancreatic Cancer   Heart disease Father 12       CABG   Social History   Socioeconomic History   Marital status: Married    Spouse name: Not on file   Number of children: Not on file   Years of education: Not on file   Highest education level: Not on file  Occupational History   Occupation: Patent examiner  Social Needs   Financial resource strain: Not on file   Food insecurity    Worry: Not on file    Inability: Not on file   Transportation needs    Medical: Not on file    Non-medical: Not on file  Tobacco Use   Smoking status: Former Smoker    Packs/day: 1.00    Years: 20.00    Pack years: 20.00    Types: Cigarettes    Quit  date: 08/11/1988    Years since quitting: 30.7   Smokeless tobacco: Never Used  Substance and Sexual Activity   Alcohol use: Yes    Comment: 05/21/2017 "1-2 beers/month"   Drug use: No   Sexual activity: Not Currently  Lifestyle   Physical activity    Days per week: Not on file    Minutes per session: Not on file   Stress: Not on file  Relationships   Social connections    Talks on phone: Not on file    Gets together: Not on file    Attends religious service: Not on file    Active member of club or organization: Not on file    Attends meetings of clubs or organizations: Not on file    Relationship status: Not on file  Other Topics Concern  Not on file  Social History Narrative   Not on file    Outpatient Encounter Medications as of 05/03/2019  Medication Sig   amLODipine (NORVASC) 2.5 MG tablet Take 2.5 mg by mouth daily.   ARIPiprazole (ABILIFY) 2 MG tablet Take 1 tablet (2 mg total) by mouth daily.   aspirin EC 325 MG tablet Take 1 tablet (325 mg total) by mouth 2 (two) times daily.   atorvastatin (LIPITOR) 40 MG tablet Take 1 tablet (40 mg total) by mouth every evening.   busPIRone (BUSPAR) 15 MG tablet Take 1 tablet (15 mg total) by mouth 2 (two) times daily.   clonazePAM (KLONOPIN) 1 MG tablet Take 1 tablet (1 mg total) by mouth at bedtime as needed for anxiety.   gabapentin (NEURONTIN) 800 MG tablet Take 1 tablet (800 mg total) by mouth at bedtime.   LEVOXYL 112 MCG tablet TAKE 1 TABLET DAILY   losartan (COZAAR) 100 MG tablet TAKE 1 TABLET(100 MG) BY MOUTH DAILY   methocarbamol (ROBAXIN) 500 MG tablet Take 1 tablet (500 mg total) by mouth every 8 (eight) hours as needed for muscle spasms.   Multiple Vitamin (MULTIVITAMIN WITH MINERALS) TABS tablet Take 1 tablet by mouth daily.   omeprazole (PRILOSEC) 40 MG capsule Take 1 capsule (40 mg total) by mouth daily as needed (heartburn).   oxymetazoline (ANEFRIN NASAL SPRAY) 0.05 % nasal spray Place 2 sprays  into both nostrils 3 (three) times daily as needed for congestion.    tamsulosin (FLOMAX) 0.4 MG CAPS capsule Take 1 capsule (0.4 mg total) by mouth daily. (Patient taking differently: Take 0.4 mg by mouth every evening. )   venlafaxine XR (EFFEXOR-XR) 150 MG 24 hr capsule Take 2 capsules (300 mg total) by mouth daily with breakfast.   No facility-administered encounter medications on file as of 05/03/2019.     Activities of Daily Living In your present state of health, do you have any difficulty performing the following activities: 05/03/2019 09/24/2018  Hearing? N N  Vision? N N  Difficulty concentrating or making decisions? N N  Walking or climbing stairs? N N  Dressing or bathing? N N  Doing errands, shopping? N N  Preparing Food and eating ? N -  Using the Toilet? N -  In the past six months, have you accidently leaked urine? N -  Do you have problems with loss of bowel control? N -  Managing your Medications? N -  Managing your Finances? N -  Housekeeping or managing your Housekeeping? N -  Some recent data might be hidden    Patient Care Team: Delorse Limber as PCP - General (Family Medicine) Jettie Booze, MD as PCP - Cardiology (Cardiology) Sydnee Cabal, MD (Orthopedic Surgery) Comer Locket, PA-C as Physician Assistant (Physician Assistant) Alyson Ingles Candee Furbish, MD as Consulting Physician (Urology) Delight Hoh, MD as Consulting Physician (Psychiatry) Otelia Sergeant, OD as Referring Physician   Assessment:   This is a routine wellness examination for [place].  Exercise Activities and Dietary recommendations Current Exercise Habits: Home exercise routine, Type of exercise: walking, Time (Minutes): 30, Frequency (Times/Week): 3, Weekly Exercise (Minutes/Week): 90, Exercise limited by: orthopedic condition(s)(Recent right knee replacement)  Goals   None     Fall Risk Fall Risk  05/03/2019 04/29/2018  Falls in the past year? 0 No  Number falls  in past yr: 0 -  Injury with Fall? 0 -  Risk for fall due to : History of fall(s) -  Is the patient's home free of loose throw rugs in walkways, pet beds, electrical cords, etc?   no  - wife has a lot of throw rugs. Discussed removal or taping the corners down      Grab bars in the bathroom? no      Handrails on the stairs?   yes      Adequate lighting?   yes   Depression Screen PHQ 2/9 Scores 05/03/2019 06/21/2018 04/29/2018  PHQ - 2 Score 0 0 1  PHQ- 9 Score - 0 -   Cognitive Function        Immunization History  Administered Date(s) Administered   Fluad Quad(high Dose 65+) 04/06/2019   Hepatitis B 10/21/2007, 11/22/2007, 05/09/2008   Influenza Split 05/11/2012   Influenza Whole 06/05/2009, 05/10/2011   Influenza, High Dose Seasonal PF 05/03/2014, 04/01/2018   Influenza, Seasonal, Injecte, Preservative Fre 04/06/2019   Influenza,inj,Quad PF,6+ Mos 04/11/2016, 04/14/2017   Influenza,inj,quad, With Preservative 05/11/2017, 05/11/2018   Pneumococcal Conjugate-13 02/06/2014   Pneumococcal Polysaccharide-23 10/31/2008, 04/11/2016   Td 08/11/2006   Tdap 06/02/2017   Zoster 11/22/2007   Screening Tests Health Maintenance  Topic Date Due   COLONOSCOPY  09/04/2024   DTaP/Tdap/Td (2 - Td) 06/03/2027   TETANUS/TDAP  06/03/2027   INFLUENZA VACCINE  Completed   Hepatitis C Screening  Completed   PNA vac Low Risk Adult  Completed   Cancer Screenings: Lung: Low Dose CT Chest recommended if Age 72-80 years, 30 pack-year currently smoking OR have quit w/in 15years. Patient does not qualify. Colorectal: UTD      Plan:  1. Encounter for Medicare annual wellness exam During the course of the visit the patient was educated and counseled about appropriate screening and preventive services including: Fall prevention, Bone densitometry screening, Diabetes screening, Nutrition counseling.  Patient UTD on required immunizations. Will obtain fasting labs at today's  visit.   Patient Instructions (the written plan) was given to the patient.   2. Essential hypertension With episodic lightheadedness. Otherwise asymptomatic. BP today at low end of normal. Reviewed proper diet. Will decrease losartan to 50 mg daily. Follow-up in 2 weeks for reassessment. - Comprehensive metabolic panel - Lipid panel  3. Gastroesophageal reflux disease without esophagitis Stable. Continue PPI PRN.  4. HYPOTHYROIDISM, POST-RADIOACTIVE IODINE Taking medications as directed. Repeat TSH levels today. - CBC with Differential/Platelet - TSH  5. Dyslipidemia Taking medications as directed. Will recheck levels today. Dietary and exercise recommendations reviewed. - CBC with Differential/Platelet - Comprehensive metabolic panel - Lipid panel  6. Urinary frequency With notation of polydipsia. Will check BMP today to assess glucose and electrolytes. Urine dip today with no concerning findings. If labs stable he is to discuss further with urology at appt in a couple of weeks. - CBC with Differential/Platelet - POCT urinalysis dipstick   I have personally reviewed and noted the following in the patients chart:    Medical and social history  Use of alcohol, tobacco or illicit drugs   Current medications and supplements  Functional ability and status  Nutritional status  Physical activity  Advanced directives  List of other physicians  Hospitalizations, surgeries, and ER visits in previous 12 months  Vitals  Screenings to include cognitive, depression, and falls  Referrals and appointments  In addition, I have reviewed and discussed with patient certain preventive protocols, quality metrics, and best practice recommendations. A written personalized care plan for preventive services as well as general preventive health recommendations were provided to patient.  Leeanne Rio, [place]  05/03/2019

## 2019-05-05 NOTE — Telephone Encounter (Signed)
Tried to reach patient again, left voicemail

## 2019-05-17 ENCOUNTER — Other Ambulatory Visit: Payer: Self-pay

## 2019-05-17 ENCOUNTER — Encounter: Payer: Self-pay | Admitting: Physician Assistant

## 2019-05-17 ENCOUNTER — Ambulatory Visit (INDEPENDENT_AMBULATORY_CARE_PROVIDER_SITE_OTHER): Payer: Medicare Other | Admitting: Physician Assistant

## 2019-05-17 VITALS — BP 110/64 | HR 84 | Temp 98.1°F | Resp 16 | Ht 69.0 in | Wt 193.0 lb

## 2019-05-17 DIAGNOSIS — I10 Essential (primary) hypertension: Secondary | ICD-10-CM | POA: Diagnosis not present

## 2019-05-17 DIAGNOSIS — B356 Tinea cruris: Secondary | ICD-10-CM

## 2019-05-17 MED ORDER — CLOTRIMAZOLE-BETAMETHASONE 1-0.05 % EX CREA: 1.0000 "application " | TOPICAL_CREAM | Freq: Two times a day (BID) | CUTANEOUS | 0 refills | Status: DC

## 2019-05-17 NOTE — Progress Notes (Signed)
Patient with history of hypertension presents to clinic today for reassessment of blood pressure.  At last visit patient was noted to have slight hypotension with orthostatic lightheadedness. AS such his Losartan was decreased from 100 mg to 50 mg daily. Amlodipine continued at previous dose. Endorses taking medications as directed. Tolerating well without continued symptoms. Patient denies chest pain, palpitations, lightheadedness, dizziness, vision changes or frequent headaches.  BP Readings from Last 3 Encounters:  05/17/19 110/64  05/03/19 100/62  03/03/19 117/77   Also mentions pruritic rash of inguinal region he would like looked at today.  Past Medical History:  Diagnosis Date  . Adenocarcinoma of prostate ([place]) 04/16/2013  . Aneurysm, aorta, thoracic ([place])   . Anxiety   . Arthritis    "knees" (05/21/2017)  . Depression   . Diverticulosis   . Dyspnea    on exertion for a long period time  . GERD (gastroesophageal reflux disease)   . Hyperlipidemia   . Hypertension   . Hypothyroidism, postradioiodine therapy    1980's  . Nocturia   . Organic impotence   . OSA (obstructive sleep apnea)    NO CPAP--  S/P SURGERY  2002  . Osteoporosis   . Pneumonia 1970  . PONV (postoperative nausea and vomiting)   . Urge urinary incontinence   . Wears contact lenses     Current Outpatient Medications on File Prior to Visit  Medication Sig Dispense Refill  . amLODipine (NORVASC) 2.5 MG tablet Take 2.5 mg by mouth daily.    . ARIPiprazole (ABILIFY) 2 MG tablet Take 1 tablet (2 mg total) by mouth daily. 90 tablet 1  . aspirin EC 325 MG tablet Take 1 tablet (325 mg total) by mouth 2 (two) times daily. 60 tablet 11  . atorvastatin (LIPITOR) 40 MG tablet Take 1 tablet (40 mg total) by mouth every evening. 90 tablet 1  . busPIRone (BUSPAR) 15 MG tablet Take 1 tablet (15 mg total) by mouth 2 (two) times daily. 180 tablet 1  . clonazePAM (KLONOPIN) 1 MG tablet Take 1 tablet (1 mg total) by  mouth at bedtime as needed for anxiety. 30 tablet 0  . gabapentin (NEURONTIN) 800 MG tablet Take 1 tablet (800 mg total) by mouth at bedtime. 90 tablet 1  . LEVOXYL 112 MCG tablet TAKE 1 TABLET DAILY 90 tablet 1  . losartan (COZAAR) 100 MG tablet TAKE 1 TABLET(100 MG) BY MOUTH DAILY (Patient taking differently: Take 50 mg by mouth daily. TAKE 1 TABLET(100 MG) BY MOUTH DAILY) 90 tablet 1  . methocarbamol (ROBAXIN) 500 MG tablet Take 1 tablet (500 mg total) by mouth every 8 (eight) hours as needed for muscle spasms. 50 tablet 1  . Multiple Vitamin (MULTIVITAMIN WITH MINERALS) TABS tablet Take 1 tablet by mouth daily.    Marland Kitchen omeprazole (PRILOSEC) 40 MG capsule Take 1 capsule (40 mg total) by mouth daily as needed (heartburn). 90 capsule 1  . oxymetazoline (ANEFRIN NASAL SPRAY) 0.05 % nasal spray Place 2 sprays into both nostrils 3 (three) times daily as needed for congestion.     . tamsulosin (FLOMAX) 0.4 MG CAPS capsule Take 1 capsule (0.4 mg total) by mouth daily. (Patient taking differently: Take 0.4 mg by mouth every evening. ) 30 capsule 3  . venlafaxine XR (EFFEXOR-XR) 150 MG 24 hr capsule Take 2 capsules (300 mg total) by mouth daily with breakfast. 180 capsule 1   No current facility-administered medications on file prior to visit.     Allergies  Allergen Reactions  . Codeine Nausea And Vomiting and Other (See Comments)    DIZZINESS  . Ciprofloxacin Other (See Comments)    Unknown    Family History  Problem Relation Age of Onset  . Cancer Mother        Pancreatic Cancer  . Heart disease Father 52       CABG    Social History   Socioeconomic History  . Marital status: Married    Spouse name: Not on file  . Number of children: Not on file  . Years of education: Not on file  . Highest education level: Not on file  Occupational History  . Occupation: Patent examiner  Social Needs  . Financial resource strain: Not on file  . Food insecurity    Worry: Not on file     Inability: Not on file  . Transportation needs    Medical: Not on file    Non-medical: Not on file  Tobacco Use  . Smoking status: Former Smoker    Packs/day: 1.00    Years: 20.00    Pack years: 20.00    Types: Cigarettes    Quit date: 08/11/1988    Years since quitting: 30.7  . Smokeless tobacco: Never Used  Substance and Sexual Activity  . Alcohol use: Yes    Comment: 05/21/2017 "1-2 beers/month"  . Drug use: No  . Sexual activity: Not Currently  Lifestyle  . Physical activity    Days per week: Not on file    Minutes per session: Not on file  . Stress: Not on file  Relationships  . Social Herbalist on phone: Not on file    Gets together: Not on file    Attends religious service: Not on file    Active member of club or organization: Not on file    Attends meetings of clubs or organizations: Not on file    Relationship status: Not on file  Other Topics Concern  . Not on file  Social History Narrative  . Not on file   Review of Systems - See HPI.  All other ROS are negative.  BP 110/64   Pulse 84   Temp 98.1 F (36.7 C) (Temporal)   Resp 16   Ht 5\' 9"  (1.753 m)   Wt 193 lb (87.5 kg)   SpO2 97%   BMI 28.50 kg/m   Physical Exam Vitals signs reviewed.  Constitutional:      Appearance: Normal appearance.  HENT:     Head: Normocephalic and atraumatic.  Eyes:     Conjunctiva/sclera: Conjunctivae normal.     Pupils: Pupils are equal, round, and reactive to light.  Neck:     Musculoskeletal: Neck supple.  Cardiovascular:     Rate and Rhythm: Normal rate and regular rhythm.     Pulses: Normal pulses.     Heart sounds: Normal heart sounds.  Pulmonary:     Effort: Pulmonary effort is normal.     Breath sounds: Normal breath sounds.  Skin:      Neurological:     General: No focal deficit present.     Mental Status: He is alert and oriented to person, place, and time.  Psychiatric:        Mood and Affect: Mood normal.     Recent Results (from  the past 2160 hour(s))  POCT urinalysis dipstick     Status: Normal   Collection Time: 05/03/19 10:45 AM  Result Value Ref Range  Color, UA yellow    Clarity, UA clear    Glucose, UA Negative Negative   Bilirubin, UA negative    Ketones, UA negative    Spec Grav, UA 1.020 1.010 - 1.025   Blood, UA negative    pH, UA 6.0 5.0 - 8.0   Protein, UA Negative Negative   Urobilinogen, UA 0.2 0.2 or 1.0 E.U./dL   Nitrite, UA negative    Leukocytes, UA Negative Negative   Appearance     Odor    CBC with Differential/Platelet     Status: None   Collection Time: 05/03/19 10:54 AM  Result Value Ref Range   WBC 7.5 4.0 - 10.5 K/uL   RBC 4.77 4.22 - 5.81 Mil/uL   Hemoglobin 15.0 13.0 - 17.0 g/dL   HCT 44.7 39.0 - 52.0 %   MCV 93.6 78.0 - 100.0 fl   MCHC 33.5 30.0 - 36.0 g/dL   RDW 14.3 11.5 - 15.5 %   Platelets 238.0 150.0 - 400.0 K/uL   Neutrophils Relative % 58.3 43.0 - 77.0 %   Lymphocytes Relative 27.8 12.0 - 46.0 %   Monocytes Relative 9.7 3.0 - 12.0 %   Eosinophils Relative 3.4 0.0 - 5.0 %   Basophils Relative 0.8 0.0 - 3.0 %   Neutro Abs 4.4 1.4 - 7.7 K/uL   Lymphs Abs 2.1 0.7 - 4.0 K/uL   Monocytes Absolute 0.7 0.1 - 1.0 K/uL   Eosinophils Absolute 0.3 0.0 - 0.7 K/uL   Basophils Absolute 0.1 0.0 - 0.1 K/uL  Comprehensive metabolic panel     Status: None   Collection Time: 05/03/19 10:54 AM  Result Value Ref Range   Sodium 138 135 - 145 mEq/L   Potassium 4.4 3.5 - 5.1 mEq/L   Chloride 101 96 - 112 mEq/L   CO2 30 19 - 32 mEq/L   Glucose, Bld 91 70 - 99 mg/dL   BUN 16 6 - 23 mg/dL   Creatinine, Ser 0.93 0.40 - 1.50 mg/dL   Total Bilirubin 0.6 0.2 - 1.2 mg/dL   Alkaline Phosphatase 61 39 - 117 U/L   AST 21 0 - 37 U/L   ALT 19 0 - 53 U/L   Total Protein 7.0 6.0 - 8.3 g/dL   Albumin 4.6 3.5 - 5.2 g/dL   Calcium 10.0 8.4 - 10.5 mg/dL   GFR 79.29 >60.00 mL/min  Lipid panel     Status: Abnormal   Collection Time: 05/03/19 10:54 AM  Result Value Ref Range   Cholesterol 147  0 - 200 mg/dL    Comment: ATP III Classification       Desirable:  < 200 mg/dL               Borderline High:  200 - 239 mg/dL          High:  > = 240 mg/dL   Triglycerides 212.0 (H) 0.0 - 149.0 mg/dL    Comment: Normal:  <150 mg/dLBorderline High:  150 - 199 mg/dL   HDL 43.60 >39.00 mg/dL   VLDL 42.4 (H) 0.0 - 40.0 mg/dL   Total CHOL/HDL Ratio 3     Comment:                Men          Women1/2 Average Risk     3.4          3.3Average Risk          5.0  4.42X Average Risk          9.6          7.13X Average Risk          15.0          11.0                       NonHDL 103.45     Comment: NOTE:  Non-HDL goal should be 30 mg/dL higher than patient's LDL goal (i.e. LDL goal of < 70 mg/dL, would have non-HDL goal of < 100 mg/dL)  TSH     Status: None   Collection Time: 05/03/19 10:54 AM  Result Value Ref Range   TSH 2.87 0.35 - 4.50 uIU/mL  LDL cholesterol, direct     Status: None   Collection Time: 05/03/19 10:54 AM  Result Value Ref Range   Direct LDL 73.0 mg/dL    Comment: Optimal:  <100 mg/dLNear or Above Optimal:  100-129 mg/dLBorderline High:  130-159 mg/dLHigh:  160-189 mg/dLVery High:  >190 mg/dL    Assessment/Plan: 1. Essential hypertension Improved BP today. Asymptomatic. Continue current regimen. Follow-up scheduled.   2. Jock itch Mild. Start Lotrisone. Supportive measures reviewed. Follow-up if not resolving.  - clotrimazole-betamethasone (LOTRISONE) cream; Apply 1 application topically 2 (two) times daily.  Dispense: 30 g; Refill: 0   Leeanne Rio, PA-C

## 2019-05-17 NOTE — Patient Instructions (Signed)
-  Please keep skin clean and dry. -Continue wearing loose undergarments so the skin can breathe more. -Can apply baby powder to the area to keep dry. -Apply the prescription cream to the area twice daily for up to 1 week.   -Keep current dose of the Losartan.   Let me know if things are not resolving.

## 2019-05-30 DIAGNOSIS — C61 Malignant neoplasm of prostate: Secondary | ICD-10-CM | POA: Diagnosis not present

## 2019-06-02 ENCOUNTER — Telehealth: Payer: Self-pay | Admitting: Physician Assistant

## 2019-06-02 NOTE — Telephone Encounter (Signed)
The patient is wanting to know if he needs to see you to have his colonoscopy done again since it's been 5 years.  Please advise

## 2019-06-03 NOTE — Telephone Encounter (Signed)
Left detailed message on patient VM that no additional appointment needed. GI office should contact patient when colonoscopy is due.

## 2019-06-03 NOTE — Telephone Encounter (Signed)
No appointment needed. Dr. Lynne Leader office should call him in January to schedule colonoscopy as that would be the 5-year mark.

## 2019-06-06 DIAGNOSIS — N5201 Erectile dysfunction due to arterial insufficiency: Secondary | ICD-10-CM | POA: Diagnosis not present

## 2019-06-06 DIAGNOSIS — N3943 Post-void dribbling: Secondary | ICD-10-CM | POA: Diagnosis not present

## 2019-06-06 DIAGNOSIS — C61 Malignant neoplasm of prostate: Secondary | ICD-10-CM | POA: Diagnosis not present

## 2019-06-08 DIAGNOSIS — L820 Inflamed seborrheic keratosis: Secondary | ICD-10-CM | POA: Diagnosis not present

## 2019-06-08 DIAGNOSIS — X32XXXD Exposure to sunlight, subsequent encounter: Secondary | ICD-10-CM | POA: Diagnosis not present

## 2019-06-08 DIAGNOSIS — L570 Actinic keratosis: Secondary | ICD-10-CM | POA: Diagnosis not present

## 2019-06-08 DIAGNOSIS — L821 Other seborrheic keratosis: Secondary | ICD-10-CM | POA: Diagnosis not present

## 2019-06-08 DIAGNOSIS — D225 Melanocytic nevi of trunk: Secondary | ICD-10-CM | POA: Diagnosis not present

## 2019-06-15 ENCOUNTER — Other Ambulatory Visit: Payer: Self-pay | Admitting: Physician Assistant

## 2019-06-15 MED ORDER — LOSARTAN POTASSIUM 100 MG PO TABS: 50.0000 mg | ORAL_TABLET | Freq: Every day | ORAL | 1 refills | Status: DC

## 2019-06-20 ENCOUNTER — Other Ambulatory Visit: Payer: Self-pay | Admitting: Physician Assistant

## 2019-06-22 ENCOUNTER — Telehealth: Payer: Self-pay | Admitting: Gastroenterology

## 2019-06-22 NOTE — Telephone Encounter (Signed)
Patient notified that he is due in 2026.  He will call back for any GI changes or concerns prior to 2026

## 2019-06-22 NOTE — Telephone Encounter (Signed)
Per last procedure 09/04/2014 the patient needs a 10 year recall.  He will be due in 08/2024.    Left message for patient to call back

## 2019-06-27 ENCOUNTER — Other Ambulatory Visit: Payer: Self-pay | Admitting: Physician Assistant

## 2019-06-29 ENCOUNTER — Other Ambulatory Visit: Payer: Self-pay

## 2019-06-29 ENCOUNTER — Ambulatory Visit (INDEPENDENT_AMBULATORY_CARE_PROVIDER_SITE_OTHER): Payer: Medicare Other | Admitting: Physician Assistant

## 2019-06-29 ENCOUNTER — Encounter: Payer: Self-pay | Admitting: Physician Assistant

## 2019-06-29 VITALS — BP 118/78 | HR 95 | Temp 98.0°F | Resp 16 | Ht 69.0 in | Wt 188.0 lb

## 2019-06-29 DIAGNOSIS — B356 Tinea cruris: Secondary | ICD-10-CM | POA: Diagnosis not present

## 2019-06-29 MED ORDER — NAFTIFINE HCL 2 % EX CREA: TOPICAL_CREAM | CUTANEOUS | 0 refills | Status: DC

## 2019-06-29 NOTE — Patient Instructions (Signed)
Please stop the current medication.  Start the Naftifine cream once daily as directed to the affected area. Will continue for 2-3 weeks.  Can get OTC Cerave Itch relief lotion to apply to the area twice daily.  Continue hygiene practices.   If no significant improvement in 2 weeks will need to proceed with oral medication.    Jock Itch  Jock itch is an itchy rash in the groin and upper thigh area. It is a skin infection that is caused by a type of germ that lives in dark, damp places (fungus). The rash usually goes away in 2-3 weeks with treatment. Follow these instructions at home: Skin care  Use skin creams, ointments, or powders exactly as told by your doctor.  Wear loose-fitting clothes. Clothes should not rub against your groin area. Men should wear boxer shorts or loose-fitting underwear.  Keep your groin area clean and dry. ? Change your underwear every day. ? Change out of wet bathing suits as soon as you can. ? After bathing, use a separate towel to dry your groin area. Dry the area gently and completely.  Avoid hot baths and showers. Hot water can make itching worse.  Do not scratch the area. General instructions  Take and apply over-the-counter and prescription medicines only as told by your doctor.  Do not share towels or clothing with other people.  Wash your hands often with soap and water, especially after touching your groin area. If you do not have soap and water, use alcohol-based hand sanitizer. Contact a doctor if:  Your rash: ? Gets worse. ? Does not get better after 2 weeks of treatment. ? Spreads. ? Comes back after treatment is done.  You have any of the following: ? A fever. ? New or worsening redness, swelling, or pain around your rash. ? Fluid, blood, or pus coming from your rash. Summary  Jock itch is an itchy rash. It affects the groin and upper thigh area.  Jock itch usually goes away in 2-3 weeks with treatment.  Keep your groin  area clean and dry. This information is not intended to replace advice given to you by your health care provider. Make sure you discuss any questions you have with your health care provider. Document Released: 10/22/2009 Document Revised: 07/10/2017 Document Reviewed: 07/08/2017 Elsevier Patient Education  2020 [employer].

## 2019-06-29 NOTE — Progress Notes (Signed)
Patient presents to clinic today c/o tinea cruris. Was initially within left inguinal fold, improved some with topical cream but never completely resolved. States it has progressed, is now in inguinal folds bilaterally. States it is erythematous, pruritic, and irritating. Has been trying to keep the area clean and dry, wearing several pairs of underwear daily to decrease moisture.   Past Medical History:  Diagnosis Date  . Adenocarcinoma of prostate ([place]) 04/16/2013  . Aneurysm, aorta, thoracic ([place])   . Anxiety   . Arthritis    "knees" (05/21/2017)  . Depression   . Diverticulosis   . Dyspnea    on exertion for a long period time  . GERD (gastroesophageal reflux disease)   . Hyperlipidemia   . Hypertension   . Hypothyroidism, postradioiodine therapy    1980's  . Nocturia   . Organic impotence   . OSA (obstructive sleep apnea)    NO CPAP--  S/P SURGERY  2002  . Osteoporosis   . Pneumonia 1970  . PONV (postoperative nausea and vomiting)   . Urge urinary incontinence   . Wears contact lenses     Current Outpatient Medications on File Prior to Visit  Medication Sig Dispense Refill  . amLODipine (NORVASC) 2.5 MG tablet Take 2.5 mg by mouth daily.    . ARIPiprazole (ABILIFY) 2 MG tablet Take 1 tablet (2 mg total) by mouth daily. 90 tablet 1  . aspirin EC 325 MG tablet Take 1 tablet (325 mg total) by mouth 2 (two) times daily. 60 tablet 11  . atorvastatin (LIPITOR) 40 MG tablet Take 1 tablet (40 mg total) by mouth every evening. 90 tablet 1  . busPIRone (BUSPAR) 15 MG tablet Take 1 tablet (15 mg total) by mouth 2 (two) times daily. 180 tablet 1  . clonazePAM (KLONOPIN) 1 MG tablet Take 1 tablet (1 mg total) by mouth at bedtime as needed for anxiety. 30 tablet 0  . clotrimazole-betamethasone (LOTRISONE) cream Apply 1 application topically 2 (two) times daily. 30 g 0  . gabapentin (NEURONTIN) 800 MG tablet Take 1 tablet (800 mg total) by mouth at bedtime. 90 tablet 1  . LEVOXYL 112  MCG tablet TAKE 1 TABLET DAILY 90 tablet 3  . losartan (COZAAR) 100 MG tablet TAKE 1 TABLET BY MOUTH EVERY DAY 90 tablet 0  . methocarbamol (ROBAXIN) 500 MG tablet Take 1 tablet (500 mg total) by mouth every 8 (eight) hours as needed for muscle spasms. 50 tablet 1  . Multiple Vitamin (MULTIVITAMIN WITH MINERALS) TABS tablet Take 1 tablet by mouth daily.    Marland Kitchen omeprazole (PRILOSEC) 40 MG capsule Take 1 capsule (40 mg total) by mouth daily as needed (heartburn). 90 capsule 1  . oxymetazoline (ANEFRIN NASAL SPRAY) 0.05 % nasal spray Place 2 sprays into both nostrils 3 (three) times daily as needed for congestion.     . tamsulosin (FLOMAX) 0.4 MG CAPS capsule Take 1 capsule (0.4 mg total) by mouth daily. (Patient taking differently: Take 0.4 mg by mouth every evening. ) 30 capsule 3  . venlafaxine XR (EFFEXOR-XR) 150 MG 24 hr capsule Take 2 capsules (300 mg total) by mouth daily with breakfast. 180 capsule 1   No current facility-administered medications on file prior to visit.     Allergies  Allergen Reactions  . Codeine Nausea And Vomiting and Other (See Comments)    DIZZINESS  . Ciprofloxacin Other (See Comments)    Unknown    Family History  Problem Relation Age of Onset  .  Cancer Mother        Pancreatic Cancer  . Heart disease Father 59       CABG    Social History   Socioeconomic History  . Marital status: Married    Spouse name: Not on file  . Number of children: Not on file  . Years of education: Not on file  . Highest education level: Not on file  Occupational History  . Occupation: Patent examiner  Social Needs  . Financial resource strain: Not on file  . Food insecurity    Worry: Not on file    Inability: Not on file  . Transportation needs    Medical: Not on file    Non-medical: Not on file  Tobacco Use  . Smoking status: Former Smoker    Packs/day: 1.00    Years: 20.00    Pack years: 20.00    Types: Cigarettes    Quit date: 08/11/1988    Years since  quitting: 30.9  . Smokeless tobacco: Never Used  Substance and Sexual Activity  . Alcohol use: Yes    Comment: 05/21/2017 "1-2 beers/month"  . Drug use: No  . Sexual activity: Not Currently  Lifestyle  . Physical activity    Days per week: Not on file    Minutes per session: Not on file  . Stress: Not on file  Relationships  . Social Herbalist on phone: Not on file    Gets together: Not on file    Attends religious service: Not on file    Active member of club or organization: Not on file    Attends meetings of clubs or organizations: Not on file    Relationship status: Not on file  Other Topics Concern  . Not on file  Social History Narrative  . Not on file    Review of Systems - See HPI.  All other ROS are negative.  Wt 188 lb (85.3 kg)   BMI 27.76 kg/m   Physical Exam Vitals signs reviewed.  HENT:     Head: Normocephalic and atraumatic.  Pulmonary:     Effort: Pulmonary effort is normal.  Skin:    Findings: Erythema and rash present.       Neurological:     General: No focal deficit present.     Mental Status: He is alert.  Psychiatric:        Mood and Affect: Mood normal.        Thought Content: Thought content normal.     Recent Results (from the past 2160 hour(s))  POCT urinalysis dipstick     Status: Normal   Collection Time: 05/03/19 10:45 AM  Result Value Ref Range   Color, UA yellow    Clarity, UA clear    Glucose, UA Negative Negative   Bilirubin, UA negative    Ketones, UA negative    Spec Grav, UA 1.020 1.010 - 1.025   Blood, UA negative    pH, UA 6.0 5.0 - 8.0   Protein, UA Negative Negative   Urobilinogen, UA 0.2 0.2 or 1.0 E.U./dL   Nitrite, UA negative    Leukocytes, UA Negative Negative   Appearance     Odor    CBC with Differential/Platelet     Status: None   Collection Time: 05/03/19 10:54 AM  Result Value Ref Range   WBC 7.5 4.0 - 10.5 K/uL   RBC 4.77 4.22 - 5.81 Mil/uL   Hemoglobin 15.0 13.0 - 17.0 g/dL  HCT  44.7 39.0 - 52.0 %   MCV 93.6 78.0 - 100.0 fl   MCHC 33.5 30.0 - 36.0 g/dL   RDW 14.3 11.5 - 15.5 %   Platelets 238.0 150.0 - 400.0 K/uL   Neutrophils Relative % 58.3 43.0 - 77.0 %   Lymphocytes Relative 27.8 12.0 - 46.0 %   Monocytes Relative 9.7 3.0 - 12.0 %   Eosinophils Relative 3.4 0.0 - 5.0 %   Basophils Relative 0.8 0.0 - 3.0 %   Neutro Abs 4.4 1.4 - 7.7 K/uL   Lymphs Abs 2.1 0.7 - 4.0 K/uL   Monocytes Absolute 0.7 0.1 - 1.0 K/uL   Eosinophils Absolute 0.3 0.0 - 0.7 K/uL   Basophils Absolute 0.1 0.0 - 0.1 K/uL  Comprehensive metabolic panel     Status: None   Collection Time: 05/03/19 10:54 AM  Result Value Ref Range   Sodium 138 135 - 145 mEq/L   Potassium 4.4 3.5 - 5.1 mEq/L   Chloride 101 96 - 112 mEq/L   CO2 30 19 - 32 mEq/L   Glucose, Bld 91 70 - 99 mg/dL   BUN 16 6 - 23 mg/dL   Creatinine, Ser 0.93 0.40 - 1.50 mg/dL   Total Bilirubin 0.6 0.2 - 1.2 mg/dL   Alkaline Phosphatase 61 39 - 117 U/L   AST 21 0 - 37 U/L   ALT 19 0 - 53 U/L   Total Protein 7.0 6.0 - 8.3 g/dL   Albumin 4.6 3.5 - 5.2 g/dL   Calcium 10.0 8.4 - 10.5 mg/dL   GFR 79.29 >60.00 mL/min  Lipid panel     Status: Abnormal   Collection Time: 05/03/19 10:54 AM  Result Value Ref Range   Cholesterol 147 0 - 200 mg/dL    Comment: ATP III Classification       Desirable:  < 200 mg/dL               Borderline High:  200 - 239 mg/dL          High:  > = 240 mg/dL   Triglycerides 212.0 (H) 0.0 - 149.0 mg/dL    Comment: Normal:  <150 mg/dLBorderline High:  150 - 199 mg/dL   HDL 43.60 >39.00 mg/dL   VLDL 42.4 (H) 0.0 - 40.0 mg/dL   Total CHOL/HDL Ratio 3     Comment:                Men          Women1/2 Average Risk     3.4          3.3Average Risk          5.0          4.42X Average Risk          9.6          7.13X Average Risk          15.0          11.0                       NonHDL 103.45     Comment: NOTE:  Non-HDL goal should be 30 mg/dL higher than patient's LDL goal (i.e. LDL goal of < 70 mg/dL, would  have non-HDL goal of < 100 mg/dL)  TSH     Status: None   Collection Time: 05/03/19 10:54 AM  Result Value Ref Range   TSH 2.87 0.35 - 4.50  uIU/mL  LDL cholesterol, direct     Status: None   Collection Time: 05/03/19 10:54 AM  Result Value Ref Range   Direct LDL 73.0 mg/dL    Comment: Optimal:  <100 mg/dLNear or Above Optimal:  100-129 mg/dLBorderline High:  130-159 mg/dLHigh:  160-189 mg/dLVery High:  >190 mg/dL    Assessment/Plan: 1. Tinea cruris Red, shiny patch within bilateral inguinal folds, no signs of rash elsewhere. Suspect continuation of tinea cruris, not improving with Lotrisone cream. Advised patient to stop current medication, will start topical Naftifine cream and OTC Cerave itch relief lotion. Recommend continue current hygiene practices, keep area dry and clean. Advised patient to notify us if he fails to notice significant improvement within next 2 weeks in which case may need to consider oral antifungal.    Leeanne Rio, PA-C

## 2019-07-11 ENCOUNTER — Telehealth: Payer: Self-pay | Admitting: Psychiatry

## 2019-07-11 ENCOUNTER — Other Ambulatory Visit: Payer: Self-pay

## 2019-07-11 DIAGNOSIS — F5105 Insomnia due to other mental disorder: Secondary | ICD-10-CM

## 2019-07-11 MED ORDER — CLONAZEPAM 1 MG PO TABS: 1.0000 mg | ORAL_TABLET | Freq: Every evening | ORAL | 0 refills | Status: DC | PRN

## 2019-07-11 NOTE — Telephone Encounter (Signed)
Pt call requesting a refill on his Clonazepam. Fill at [facility]. Appt schedule for 12/8.

## 2019-07-11 NOTE — Telephone Encounter (Signed)
Last refill 04/22/2019 #90 for 90 day Pended for approval [facility]

## 2019-07-13 ENCOUNTER — Other Ambulatory Visit: Payer: Self-pay | Admitting: Psychiatry

## 2019-07-13 DIAGNOSIS — F3342 Major depressive disorder, recurrent, in full remission: Secondary | ICD-10-CM

## 2019-07-19 ENCOUNTER — Ambulatory Visit (INDEPENDENT_AMBULATORY_CARE_PROVIDER_SITE_OTHER): Payer: Medicare Other | Admitting: Psychiatry

## 2019-07-19 ENCOUNTER — Other Ambulatory Visit: Payer: Self-pay

## 2019-07-19 ENCOUNTER — Encounter: Payer: Self-pay | Admitting: Psychiatry

## 2019-07-19 DIAGNOSIS — R251 Tremor, unspecified: Secondary | ICD-10-CM

## 2019-07-19 DIAGNOSIS — F5105 Insomnia due to other mental disorder: Secondary | ICD-10-CM | POA: Diagnosis not present

## 2019-07-19 DIAGNOSIS — F411 Generalized anxiety disorder: Secondary | ICD-10-CM

## 2019-07-19 DIAGNOSIS — F3342 Major depressive disorder, recurrent, in full remission: Secondary | ICD-10-CM | POA: Diagnosis not present

## 2019-07-19 MED ORDER — GABAPENTIN 800 MG PO TABS: 800.0000 mg | ORAL_TABLET | Freq: Every day | ORAL | 1 refills | Status: DC

## 2019-07-19 MED ORDER — CLONAZEPAM 1 MG PO TABS: 1.0000 mg | ORAL_TABLET | Freq: Every evening | ORAL | 0 refills | Status: DC | PRN

## 2019-07-19 MED ORDER — ARIPIPRAZOLE 2 MG PO TABS: 2.0000 mg | ORAL_TABLET | Freq: Every day | ORAL | 1 refills | Status: DC

## 2019-07-19 MED ORDER — VENLAFAXINE HCL ER 150 MG PO CP24: 300.0000 mg | ORAL_CAPSULE | Freq: Every day | ORAL | 1 refills | Status: DC

## 2019-07-19 NOTE — Patient Instructions (Addendum)
Reduce buspirone to 1/2 tablet twice a day for 1 week and then stop it.  If anxiety gets worse in the next couple of weeks call us back and we can restart it.

## 2019-07-19 NOTE — Progress Notes (Signed)
Bryan Tyler [id number] 07/27/1946 73 y.o.  Subjective:   Patient ID:  Bryan Tyler is a 73 y.o. (DOB May 26, 1946) male.  Chief Complaint:  Chief Complaint  Patient presents with  . Follow-up    depession and anxiety  . Medication Problem    tremor and med change    HPI Bryan Tyler presents to the office today for follow-up of history of major depression and anxiety and insomnia.  Last seen June 2020.  The only medication change was to reduce Abilify from 5 mg to 2 mg daily to see if tremor would improve.  He had residual anxiety but not much depression at the time.  Patient called April 22, 2019 asking for refill of clonazepam early indicating he had increased the dosages on his own to 1.5 mg instead of 1 mg.  He was informed he could not change the dosage of a controlled substance without our permission.  Tremor resolved with reduction in Abilify.  Wants to go through meds and their purpose. No history of RLS.    Ran out of Klonopin lately and only sleeping 3-4  Hours.  Usually sleeps oK with 1 mg nightly.  Depression controlled with meds.Pt reports that mood is Anxious and describes anxiety as controlled except when ran out of clonazepam.. Anxiety symptoms include: Excessive Worry,., worse at night lying down when not distracted.   Pt reports couldn't go to sleep before clonazepam and when ran out. Has run out of it at times and can't sleep.  Pt reports that appetite is good. Pt reports that energy is good and good. Concentration is good. Suicidal thoughts:  denied by patient.  Past Psychiatric Medication Trials: Abilify 5 started 2017, Effexor 300, Buspirone 15 BID, Clonazepam 1/2 mg nightly Gabapentin 800 nightly Belsomra Lorazepam Mirtazapine 30 Trazodone 150  Review of Systems:  Review of Systems  Cardiovascular:       Enlarged aorta with follow-up next month.  Neurological: Negative for dizziness, tremors and weakness.  Neuro said no Parkinson's  dz.  Medications: I have reviewed the patient's current medications.  Current Outpatient Medications  Medication Sig Dispense Refill  . amLODipine (NORVASC) 2.5 MG tablet Take 2.5 mg by mouth daily.    . ARIPiprazole (ABILIFY) 2 MG tablet TAKE 1 TABLET DAILY (DISCONTINUE 10 MG ARIPIPRAZOLE) 90 tablet 0  . aspirin EC 325 MG tablet Take 1 tablet (325 mg total) by mouth 2 (two) times daily. 60 tablet 11  . atorvastatin (LIPITOR) 40 MG tablet Take 1 tablet (40 mg total) by mouth every evening. 90 tablet 1  . busPIRone (BUSPAR) 15 MG tablet Take 1 tablet (15 mg total) by mouth 2 (two) times daily. 180 tablet 1  . clonazePAM (KLONOPIN) 1 MG tablet Take 1 tablet (1 mg total) by mouth at bedtime as needed for anxiety. 7 tablet 0  . gabapentin (NEURONTIN) 800 MG tablet Take 1 tablet (800 mg total) by mouth at bedtime. 90 tablet 1  . LEVOXYL 112 MCG tablet TAKE 1 TABLET DAILY 90 tablet 3  . losartan (COZAAR) 100 MG tablet TAKE 1 TABLET BY MOUTH EVERY DAY 90 tablet 0  . Multiple Vitamin (MULTIVITAMIN WITH MINERALS) TABS tablet Take 1 tablet by mouth daily.    . Naftifine HCl 2 % CREA Apply thin layer to affected area once daily for 2-3 weeks. 45 g 0  . omeprazole (PRILOSEC) 40 MG capsule Take 1 capsule (40 mg total) by mouth daily as needed (heartburn). 90 capsule 1  .  oxymetazoline (ANEFRIN NASAL SPRAY) 0.05 % nasal spray Place 2 sprays into both nostrils 3 (three) times daily as needed for congestion.     . tamsulosin (FLOMAX) 0.4 MG CAPS capsule Take 1 capsule (0.4 mg total) by mouth daily. (Patient taking differently: Take 0.4 mg by mouth every evening. ) 30 capsule 3  . venlafaxine XR (EFFEXOR-XR) 150 MG 24 hr capsule Take 2 capsules (300 mg total) by mouth daily with breakfast. 180 capsule 1  . methocarbamol (ROBAXIN) 500 MG tablet Take 1 tablet (500 mg total) by mouth every 8 (eight) hours as needed for muscle spasms. (Patient not taking: Reported on 07/19/2019) 50 tablet 1   No current  facility-administered medications for this visit.     Medication Side Effects: Other: brief dizzy when stands up  Allergies:  Allergies  Allergen Reactions  . Codeine Nausea And Vomiting and Other (See Comments)    DIZZINESS  . Ciprofloxacin Other (See Comments)    Unknown    Past Medical History:  Diagnosis Date  . Adenocarcinoma of prostate ( [place]) 04/16/2013  . Aneurysm, aorta, thoracic ([place])   . Anxiety   . Arthritis    "knees" (05/21/2017)  . Depression   . Diverticulosis   . Dyspnea    on exertion for a long period time  . GERD (gastroesophageal reflux disease)   . Hyperlipidemia   . Hypertension   . Hypothyroidism, postradioiodine therapy    1980's  . Nocturia   . Organic impotence   . OSA (obstructive sleep apnea)    NO CPAP--  S/P SURGERY  2002  . Osteoporosis   . Pneumonia 1970  . PONV (postoperative nausea and vomiting)   . Urge urinary incontinence   . Wears contact lenses     Family History  Problem Relation Age of Onset  . Cancer Mother        Pancreatic Cancer  . Heart disease Father 65       CABG    Social History   Socioeconomic History  . Marital status: Married    Spouse name: Not on file  . Number of children: Not on file  . Years of education: Not on file  . Highest education level: Not on file  Occupational History  . Occupation: Patent examiner  Social Needs  . Financial resource strain: Not on file  . Food insecurity    Worry: Not on file    Inability: Not on file  . Transportation needs    Medical: Not on file    Non-medical: Not on file  Tobacco Use  . Smoking status: Former Smoker    Packs/day: 1.00    Years: 20.00    Pack years: 20.00    Types: Cigarettes    Quit date: 08/11/1988    Years since quitting: 30.9  . Smokeless tobacco: Never Used  Substance and Sexual Activity  . Alcohol use: Yes    Comment: 05/21/2017 "1-2 beers/month"  . Drug use: No  . Sexual activity: Not Currently  Lifestyle  . Physical  activity    Days per week: Not on file    Minutes per session: Not on file  . Stress: Not on file  Relationships  . Social Herbalist on phone: Not on file    Gets together: Not on file    Attends religious service: Not on file    Active member of club or organization: Not on file    Attends meetings of clubs or organizations:  Not on file    Relationship status: Not on file  . Intimate partner violence    Fear of current or ex partner: Not on file    Emotionally abused: Not on file    Physically abused: Not on file    Forced sexual activity: Not on file  Other Topics Concern  . Not on file  Social History Narrative  . Not on file    Past Medical History, Surgical history, Social history, and Family history were reviewed and updated as appropriate.   Please see review of systems for further details on the patient's review from today.   Objective:   Physical Exam:  There were no vitals taken for this visit.  Physical Exam Constitutional:      General: He is not in acute distress.    Appearance: He is well-developed.  Musculoskeletal:        General: No deformity.  Neurological:     Mental Status: He is alert and oriented to person, place, and time.     Motor: Tremor present.     Coordination: Coordination normal.     Comments: Tremor minimal  Psychiatric:        Attention and Perception: Attention and perception normal. He does not perceive auditory or visual hallucinations.        Mood and Affect: Mood is anxious. Mood is not depressed. Affect is not labile, blunt, angry or inappropriate.        Speech: Speech normal.        Behavior: Behavior normal.        Thought Content: Thought content normal. Thought content does not include homicidal or suicidal ideation. Thought content does not include homicidal or suicidal plan.        Cognition and Memory: Cognition and memory normal.        Judgment: Judgment normal.     Comments: Insight intact. No delusions.       Lab Review:     Component Value Date/Time   NA 138 05/03/2019 1054   K 4.4 05/03/2019 1054   CL 101 05/03/2019 1054   CO2 30 05/03/2019 1054   GLUCOSE 91 05/03/2019 1054   BUN 16 05/03/2019 1054   CREATININE 0.93 05/03/2019 1054   CALCIUM 10.0 05/03/2019 1054   CALCIUM 9.8 07/09/2011 0819   PROT 7.0 05/03/2019 1054   ALBUMIN 4.6 05/03/2019 1054   AST 21 05/03/2019 1054   ALT 19 05/03/2019 1054   ALKPHOS 61 05/03/2019 1054   BILITOT 0.6 05/03/2019 1054   GFRNONAA >60 09/25/2018 0505   GFRAA >60 09/25/2018 0505       Component Value Date/Time   WBC 7.5 05/03/2019 1054   RBC 4.77 05/03/2019 1054   HGB 15.0 05/03/2019 1054   HCT 44.7 05/03/2019 1054   PLT 238.0 05/03/2019 1054   MCV 93.6 05/03/2019 1054   MCH 31.4 09/26/2018 0414   MCHC 33.5 05/03/2019 1054   RDW 14.3 05/03/2019 1054   LYMPHSABS 2.1 05/03/2019 1054   MONOABS 0.7 05/03/2019 1054   EOSABS 0.3 05/03/2019 1054   BASOSABS 0.1 05/03/2019 1054    No results found for: POCLITH, LITHIUM   No results found for: PHENYTOIN, PHENOBARB, VALPROATE, CBMZ   .res Assessment: Plan:    Bryan Tyler was seen today for follow-up and medication problem.  Diagnoses and all orders for this visit:  Generalized anxiety disorder  Insomnia due to mental condition -     clonazePAM (KLONOPIN) 1 MG tablet; Take 1 tablet (1  mg total) by mouth at bedtime as needed for anxiety.  Recurrent major depressive disorder, in full remission ([place])  Tremor of unknown origin    Greater than 50% of 30 min face to face time with patient was spent on counseling and coordination of care. We discussed that Bryan Tyler has a long history of depression and anxiety the has been fairly well controlled in recent years.  He is on the maximum dosage of venlafaxine and when he had a recurrence of depression in 2017, aripiprazole 2 mg was added.  Sx still under control with reduction of Abilify from 5 mg to 2 mg daily and tremor largely resolved.   He has  been on that same dosage since then and his depression has been in control and in remission.  His anxiety is residual and sometimes affects sleep but that is controlled by the combination of gabapentin and clonazepam and buspirone.  Consider weaning gabapentin if insomnia is controlled at next visit to reduce polypharmacy.  He asked about the purpose of each of the medications that he is taking including those from other medical providers.  These were discussed as well as the side effects.  It is likely that the dizziness which is orthostatic to the dizziness that he is having is coming from 1 of the blood pressure meds.  Suggest he discuss this with his primary care doctor.  We discussed the short-term risks associated with benzodiazepines including sedation and increased fall risk among others.  Discussed long-term side effect risk including dependence, potential withdrawal symptoms, and the potential eventual dose-related risk of dementia. He does not feel like he can do with any less clonazepam.  Cautioned again against exceeding dosage of clonazepam without my OK.  reducing buspirone and eliminating it since he's on aripiprazole he probably won't miss it. Reduce buspirone to 1/2 tablet twice a day for 1 week and then stop it. If anxiety gets worse in the next couple of weeks call us back and we can restart it.  This appointment was 30 minutes  Follow-up 4 to 6 months or sooner as needed  Lynder Parents, MD, DFAPA    No future appointments.  No orders of the defined types were placed in this encounter.   -------------------------------

## 2019-08-23 ENCOUNTER — Other Ambulatory Visit: Payer: Self-pay | Admitting: Physician Assistant

## 2019-08-26 ENCOUNTER — Other Ambulatory Visit: Payer: Self-pay

## 2019-08-26 DIAGNOSIS — F3342 Major depressive disorder, recurrent, in full remission: Secondary | ICD-10-CM

## 2019-08-26 MED ORDER — VENLAFAXINE HCL ER 150 MG PO CP24: 300.0000 mg | ORAL_CAPSULE | Freq: Every day | ORAL | 1 refills | Status: DC

## 2019-09-02 ENCOUNTER — Ambulatory Visit: Payer: Medicare Other | Attending: Internal Medicine

## 2019-09-02 DIAGNOSIS — Z23 Encounter for immunization: Secondary | ICD-10-CM

## 2019-09-02 NOTE — Progress Notes (Signed)
   Covid-19 Vaccination Clinic  Name:  Bryan Tyler    MRN: [id number] DOB: [birth year]  09/02/2019  [name] was observed post Covid-19 immunization for 15 minutes without incidence. He was provided with Vaccine Information Sheet and instruction to access the V-Safe system.   [name] was instructed to call 911 with any severe reactions post vaccine: Marland Kitchen Difficulty breathing  . Swelling of your face and throat  . A fast heartbeat  . A bad rash all over your body  . Dizziness and weakness    Immunizations Administered    Name Date Dose VIS Date Route   Pfizer COVID-19 Vaccine 09/02/2019  4:00 PM 0.3 mL 07/22/2019 Intramuscular   Manufacturer: [place]   Lot: [id number]   [place]: [id number]

## 2019-09-23 ENCOUNTER — Ambulatory Visit: Payer: Medicare Other | Attending: Internal Medicine

## 2019-09-23 DIAGNOSIS — Z23 Encounter for immunization: Secondary | ICD-10-CM | POA: Insufficient documentation

## 2019-09-23 NOTE — Progress Notes (Signed)
   Covid-19 Vaccination Clinic  Name:  Bryan Tyler    MRN: [id number] DOB: [birth year]  09/23/2019  Mr. [name] was observed post Covid-19 immunization for 15 minutes without incidence. He was provided with Vaccine Information Sheet and instruction to access the V-Safe system.   Mr. [name] was instructed to call 911 with any severe reactions post vaccine: Marland Kitchen Difficulty breathing  . Swelling of your face and throat  . A fast heartbeat  . A bad rash all over your body  . Dizziness and weakness    Immunizations Administered    Name Date Dose VIS Date Route   Pfizer COVID-19 Vaccine 09/23/2019  2:48 PM 0.3 mL 07/22/2019 Intramuscular   Manufacturer: [place]   Lot: [id number]   [place]: [id number]

## 2019-09-24 ENCOUNTER — Other Ambulatory Visit: Payer: Self-pay | Admitting: Psychiatry

## 2019-09-24 DIAGNOSIS — F3342 Major depressive disorder, recurrent, in full remission: Secondary | ICD-10-CM

## 2019-09-28 DIAGNOSIS — Z471 Aftercare following joint replacement surgery: Secondary | ICD-10-CM | POA: Diagnosis not present

## 2019-09-28 DIAGNOSIS — Z96651 Presence of right artificial knee joint: Secondary | ICD-10-CM | POA: Diagnosis not present

## 2019-09-30 ENCOUNTER — Ambulatory Visit: Payer: Medicare Other | Admitting: Physician Assistant

## 2019-10-07 ENCOUNTER — Ambulatory Visit: Payer: Medicare Other

## 2019-10-11 ENCOUNTER — Other Ambulatory Visit: Payer: Self-pay | Admitting: Psychiatry

## 2019-10-11 DIAGNOSIS — F3342 Major depressive disorder, recurrent, in full remission: Secondary | ICD-10-CM

## 2019-10-17 ENCOUNTER — Other Ambulatory Visit: Payer: Self-pay

## 2019-10-17 ENCOUNTER — Telehealth: Payer: Self-pay | Admitting: Psychiatry

## 2019-10-17 DIAGNOSIS — F5105 Insomnia due to other mental disorder: Secondary | ICD-10-CM

## 2019-10-17 MED ORDER — CLONAZEPAM 1 MG PO TABS: 1.0000 mg | ORAL_TABLET | Freq: Every evening | ORAL | 0 refills | Status: DC | PRN

## 2019-10-17 NOTE — Telephone Encounter (Signed)
Clonazepam 1 mg 1 q hs prn #14, no refills called into [facility] while waiting on mail order

## 2019-10-17 NOTE — Telephone Encounter (Signed)
Pt called and stated he receives his KLONOPIN [facility] MG [facility] and he is alomost out and it wont be until around 10/29/19 when he receives it. He is asking for a 2 week supply to be sent in to [facility].

## 2019-10-26 ENCOUNTER — Telehealth: Payer: Self-pay | Admitting: Psychiatry

## 2019-10-26 ENCOUNTER — Other Ambulatory Visit: Payer: Self-pay

## 2019-10-26 DIAGNOSIS — F5105 Insomnia due to other mental disorder: Secondary | ICD-10-CM

## 2019-10-26 MED ORDER — CLONAZEPAM 1 MG PO TABS: 1.0000 mg | ORAL_TABLET | Freq: Every evening | ORAL | 0 refills | Status: DC | PRN

## 2019-10-26 NOTE — Telephone Encounter (Signed)
Last refill with [facility] 07/22/2019 #90 Submitted #14 on 10/17/2019 while waiting on mail order but having difficulty getting it mailed to him. Asked to send to [facility]. Pended for Dr. Clovis Pu to submit

## 2019-10-26 NOTE — Telephone Encounter (Signed)
Pt called stated having issues with [facility] filling Clonazepam for 90 day. Ask to send Rx to [facility] in [place]

## 2019-10-31 NOTE — Progress Notes (Signed)
Cardiology Office Note   Date:  11/01/2019   ID:  Bryan Tyler, Bryan Tyler 11/07/1945, MRN [id number]  PCP:  Brunetta Jeans, PA-C    No chief complaint on file.  CAD  Wt Readings from Last 3 Encounters:  11/01/19 189 lb (85.7 kg)  06/29/19 188 lb (85.3 kg)  05/17/19 193 lb (87.5 kg)       History of Present Illness: Bryan Tyler is a 74 y.o. male   who had a thoracic aneurysm diagnosed a few years ago.  This is followed by Dr. Servando Snare with annual imaging studies.   Father had CABG x3 at age 21.  Brother passed away in car accident.  No one with stents in his generation.  He had a stress test several years ago, in [place].    He had a shoulder replacement in the past.   Since the last visit, he has been vaccinated against COVID.    He is trying to walk regularly.    Denies :  Dizziness. Leg edema. Nitroglycerin use. Orthopnea. Palpitations. Paroxysmal nocturnal dyspnea. Shortness of breath. Syncope.     Past Medical History:  Diagnosis Date  . Adenocarcinoma of prostate ([place]) 04/16/2013  . Aneurysm, aorta, thoracic ([place])   . Anxiety   . Arthritis    "knees" (05/21/2017)  . Depression   . Diverticulosis   . Dyspnea    on exertion for a long period time  . GERD (gastroesophageal reflux disease)   . Hyperlipidemia   . Hypertension   . Hypothyroidism, postradioiodine therapy    1980's  . Nocturia   . Organic impotence   . OSA (obstructive sleep apnea)    NO CPAP--  S/P SURGERY  2002  . Osteoporosis   . Pneumonia 1970  . PONV (postoperative nausea and vomiting)   . Urge urinary incontinence   . Wears contact lenses     Past Surgical History:  Procedure Laterality Date  . HEMORRHOIDECTOMY WITH HEMORRHOID BANDING  2007  . INGUINAL HERNIA REPAIR Bilateral    x4  (2 each side)  . KNEE ARTHROSCOPY Right 2012  . NASAL SEPTUM SURGERY  1982   w/ rhinoplasty  . PROSTATE BIOPSY    . RADIOACTIVE SEED IMPLANT N/A 05/30/2013   Procedure: RADIOACTIVE  SEED IMPLANT;  Surgeon: Hanley Ben, MD;  Location: [facility];  Service: Urology;  Laterality: N/A;  . REVERSE SHOULDER ARTHROPLASTY Right 05/21/2017  . REVERSE SHOULDER ARTHROPLASTY Right 05/21/2017  . REVERSE SHOULDER ARTHROPLASTY Right 05/21/2017   Procedure: REVERSE RIGHT SHOULDER ARTHROPLASTY;  Surgeon: Justice Britain, MD;  Location: [facility];  Service: Orthopedics;  Laterality: Right;  . RHINOPLASTY  1982   w/w/ septoplasty  . SHOULDER ARTHROSCOPY W/ ROTATOR CUFF REPAIR Right 2004  . SHOULDER ARTHROSCOPY W/ ROTATOR CUFF REPAIR Left 2016  . TONSILLECTOMY  AS CHILD  . TOTAL KNEE ARTHROPLASTY Right 09/24/2018   Procedure: TOTAL KNEE ARTHROPLASTY;  Surgeon: Sydnee Cabal, MD;  Location: [facility];  Service: Orthopedics;  Laterality: Right;  Adductor Block  . UVULOPALATOPHARYNGOPLASTY  2002     Current Outpatient Medications  Medication Sig Dispense Refill  . amLODipine (NORVASC) 2.5 MG tablet Take 2.5 mg by mouth daily.    . ARIPiprazole (ABILIFY) 2 MG tablet Take 1 tablet (2 mg total) by mouth daily. 90 tablet 1  . atorvastatin (LIPITOR) 40 MG tablet TAKE 1 TABLET EVERY EVENING 90 tablet 3  . clonazePAM (KLONOPIN) 1 MG tablet Take 1 tablet (1 mg total) by  mouth at bedtime as needed for anxiety. 90 tablet 0  . gabapentin (NEURONTIN) 800 MG tablet Take 1 tablet (800 mg total) by mouth at bedtime. 90 tablet 1  . LEVOXYL 112 MCG tablet TAKE 1 TABLET DAILY 90 tablet 3  . losartan (COZAAR) 100 MG tablet Take 50 mg by mouth daily.    . methocarbamol (ROBAXIN) 500 MG tablet Take 1 tablet (500 mg total) by mouth every 8 (eight) hours as needed for muscle spasms. 50 tablet 1  . Multiple Vitamin (MULTIVITAMIN WITH MINERALS) TABS tablet Take 1 tablet by mouth daily.    . Naftifine HCl 2 % CREA Apply thin layer to affected area once daily for 2-3 weeks. 45 g 0  . omeprazole (PRILOSEC) 40 MG capsule Take 1 capsule (40 mg total) by mouth daily as needed (heartburn). 90 capsule 1  .  oxymetazoline (ANEFRIN NASAL SPRAY) 0.05 % nasal spray Place 2 sprays into both nostrils 3 (three) times daily as needed for congestion.     . tamsulosin (FLOMAX) 0.4 MG CAPS capsule Take 1 capsule (0.4 mg total) by mouth daily. (Patient taking differently: Take 0.4 mg by mouth every evening. ) 30 capsule 3  . venlafaxine XR (EFFEXOR-XR) 150 MG 24 hr capsule TAKE 2 CAPSULES DAILY WITH BREAKFAST 180 capsule 3   No current facility-administered medications for this visit.    Allergies:   Codeine and Ciprofloxacin    Social History:  The patient  reports that he quit smoking about 31 years ago. His smoking use included cigarettes. He has a 20.00 pack-year smoking history. He has never used smokeless tobacco. He reports current alcohol use. He reports that he does not use drugs.   Family History:  The patient's family history includes Cancer in his mother; Heart disease (age of onset: 50) in his father.    ROS:  Please see the history of present illness.   Otherwise, review of systems are positive for chest pain.   All other systems are reviewed and negative.    PHYSICAL EXAM: VS:  BP 126/86   Pulse 91   Ht 5\' 9"  (1.753 m)   Wt 189 lb (85.7 kg)   SpO2 97%   BMI 27.91 kg/m  , BMI Body mass index is 27.91 kg/m. GEN: Well nourished, well developed, in no acute distress  HEENT: normal  Neck: no JVD, carotid bruits, or masses Cardiac: RRR; no murmurs, rubs, or gallops,no edema  Respiratory:  clear to auscultation bilaterally, normal work of breathing GI: soft, nontender, nondistended, + BS MS: no deformity or atrophy  Skin: warm and dry, no rash Neuro:  Strength and sensation are intact Psych: euthymic mood, full affect   EKG:   The ekg ordered today demonstrates NSR, ST changes that were present on the prior ECG   Recent Labs: 05/03/2019: ALT 19; BUN 16; Creatinine, Ser 0.93; Hemoglobin 15.0; Platelets 238.0; Potassium 4.4; Sodium 138; TSH 2.87   Lipid Panel    Component Value  Date/Time   CHOL 147 05/03/2019 1054   TRIG 212.0 (H) 05/03/2019 1054   HDL 43.60 05/03/2019 1054   CHOLHDL 3 05/03/2019 1054   VLDL 42.4 (H) 05/03/2019 1054   LDLCALC 100 (H) 02/06/2014 1514   LDLDIRECT 73.0 05/03/2019 1054     Other studies Reviewed: Additional studies/ records that were reviewed today with results demonstrating: CT from 02/2019 reviewed, normal creatinine.   ASSESSMENT AND PLAN:  1. THoracic aneurysm:  4 cm. Follows with Dr. Servando Snare.  2. Family h/o  CAD: Given chest pain that persists and family h/o CAD, will plan for CTA coronaries. 3. Atypical chest pain: CTA coronaries.  Metoprolol 100 mg before the test.  4. Abnormal ECG: Stable. 5. HTN: The current medical regimen is effective;  continue present plan and medications.  Rx for losartan 50 mg tabs to mail order pharmacy.    Current medicines are reviewed at length with the patient today.  The patient concerns regarding his medicines were addressed.  The following changes have been made:  No change  Labs/ tests ordered today include:  No orders of the defined types were placed in this encounter.   Recommend 150 minutes/week of aerobic exercise Low fat, low carb, high fiber diet recommended  Disposition:   FU 1 year   Signed, Larae Grooms, MD  11/01/2019 [facility] [address], [place], [place]  [address] Phone: [phone]; Fax: [phone]

## 2019-11-01 ENCOUNTER — Ambulatory Visit (INDEPENDENT_AMBULATORY_CARE_PROVIDER_SITE_OTHER): Payer: Medicare Other | Admitting: Interventional Cardiology

## 2019-11-01 ENCOUNTER — Encounter: Payer: Self-pay | Admitting: Interventional Cardiology

## 2019-11-01 ENCOUNTER — Other Ambulatory Visit: Payer: Self-pay

## 2019-11-01 VITALS — BP 126/86 | HR 91 | Ht 69.0 in | Wt 189.0 lb

## 2019-11-01 DIAGNOSIS — I712 Thoracic aortic aneurysm, without rupture, unspecified: Secondary | ICD-10-CM

## 2019-11-01 DIAGNOSIS — I10 Essential (primary) hypertension: Secondary | ICD-10-CM

## 2019-11-01 DIAGNOSIS — R072 Precordial pain: Secondary | ICD-10-CM | POA: Diagnosis not present

## 2019-11-01 DIAGNOSIS — Z8249 Family history of ischemic heart disease and other diseases of the circulatory system: Secondary | ICD-10-CM | POA: Diagnosis not present

## 2019-11-01 DIAGNOSIS — R9431 Abnormal electrocardiogram [ECG] [EKG]: Secondary | ICD-10-CM

## 2019-11-01 MED ORDER — LOSARTAN POTASSIUM 100 MG PO TABS: 50.0000 mg | ORAL_TABLET | Freq: Every day | ORAL | 3 refills | Status: DC

## 2019-11-01 MED ORDER — METOPROLOL TARTRATE 100 MG PO TABS: ORAL_TABLET | ORAL | 0 refills | Status: DC

## 2019-11-01 NOTE — Patient Instructions (Addendum)
Medication Instructions:  Your physician recommends that you continue on your current medications as directed. Please refer to the Current Medication list given to you today.  *If you need a refill on your cardiac medications before your next appointment, please call your pharmacy*   Lab Work: None today  If you have labs (blood work) drawn today and your tests are completely normal, you will receive your results only by: Marland Kitchen MyChart Message (if you have MyChart) OR . A paper copy in the mail If you have any lab test that is abnormal or we need to change your treatment, we will call you to review the results.   Testing/Procedures: Your physician has requested that you have cardiac CT. Cardiac computed tomography (CT) is a painless test that uses an x-ray machine to take clear, detailed pictures of your heart. For further information please visit [email or web address].[email or web address]. Please follow instruction sheet as given.   Follow-Up: At [facility], you and your health needs are our priority.  As part of our continuing mission to provide you with exceptional heart care, we have created designated Provider Care Teams.  These Care Teams include your primary Cardiologist (physician) and Advanced Practice Providers (APPs -  Physician Assistants and Nurse Practitioners) who all work together to provide you with the care you need, when you need it.  We recommend signing up for the patient portal called "MyChart".  Sign up information is provided on this After Visit Summary.  MyChart is used to connect with patients for Virtual Visits (Telemedicine).  Patients are able to view lab/test results, encounter notes, upcoming appointments, etc.  Non-urgent messages can be sent to your provider as well.   To learn more about what you can do with MyChart, go to [email or web address].    Your next appointment:   12 month(s)  The format for your next appointment:   In Person  Provider:   You may see Larae Grooms, MD or one of the following Advanced Practice Providers on your designated Care Team:    Melina Copa, PA-C  Ermalinda Barrios, PA-C    Other Instructions  Your cardiac CT will be scheduled at one of the below locations:   [facility] [address] [place], [place] [address] [phone]  [facility] [address] [place], [place] [address] [phone]  If scheduled at [facility], please arrive at the [facility] main entrance of [facility] 30 minutes prior to test start time. Proceed to the [facility] Radiology Department (first floor) to check-in and test prep.  If scheduled at [facility], please arrive 15 mins early for check-in and test prep.  Please follow these instructions carefully (unless otherwise directed):    On the Night Before the Test: . Be sure to Drink plenty of water. . Do not consume any caffeinated/decaffeinated beverages or chocolate 12 hours prior to your test. . Do not take any antihistamines 12 hours prior to your test.   On the Day of the Test: . Drink plenty of water. Do not drink any water within one hour of the test. . Do not eat any food 4 hours prior to the test. . You may take your regular medications prior to the test.  . Take metoprolol (Lopressor) two hours prior to test.       After the Test: . Drink plenty of water. . After receiving IV contrast, you may experience a mild flushed feeling.  This is normal. . On occasion, you may experience a mild rash up to 24 hours after the test. This is not dangerous. If this occurs, you can take Benadryl 25 mg and increase your fluid intake. . If you experience trouble breathing, this can be serious. If it is severe call 911 IMMEDIATELY. If it is mild, please call our office.   Once we have confirmed authorization from your insurance company, we will call you to set up a date and  time for your test.   For non-scheduling related questions, please contact the cardiac imaging nurse navigator should you have any questions/concerns: Marchia Bond, RN Navigator Cardiac Imaging [name] [phone] office  For scheduling needs, including cancellations and rescheduling, please call [phone].

## 2019-11-16 ENCOUNTER — Other Ambulatory Visit: Payer: Self-pay

## 2019-11-16 ENCOUNTER — Other Ambulatory Visit: Payer: Medicare Other | Admitting: *Deleted

## 2019-11-16 DIAGNOSIS — Z8249 Family history of ischemic heart disease and other diseases of the circulatory system: Secondary | ICD-10-CM | POA: Diagnosis not present

## 2019-11-16 DIAGNOSIS — I10 Essential (primary) hypertension: Secondary | ICD-10-CM | POA: Diagnosis not present

## 2019-11-16 DIAGNOSIS — R072 Precordial pain: Secondary | ICD-10-CM

## 2019-11-16 DIAGNOSIS — I712 Thoracic aortic aneurysm, without rupture, unspecified: Secondary | ICD-10-CM

## 2019-11-16 DIAGNOSIS — R9431 Abnormal electrocardiogram [ECG] [EKG]: Secondary | ICD-10-CM

## 2019-11-16 LAB — BASIC METABOLIC PANEL
BUN/Creatinine Ratio: 14 (ref 10–24)
BUN: 12 mg/dL (ref 8–27)
CO2: 25 mmol/L (ref 20–29)
Calcium: 9.2 mg/dL (ref 8.6–10.2)
Chloride: 101 mmol/L (ref 96–106)
Creatinine, Ser: 0.86 mg/dL (ref 0.76–1.27)
GFR calc Af Amer: 98 mL/min/{1.73_m2} (ref >59)
GFR calc non Af Amer: 85 mL/min/{1.73_m2} (ref >59)
Glucose: 96 mg/dL (ref 65–99)
Potassium: 4.5 mmol/L (ref 3.5–5.2)
Sodium: 137 mmol/L (ref 134–144)

## 2019-11-17 ENCOUNTER — Ambulatory Visit: Payer: Medicare Other | Admitting: Psychiatry

## 2019-11-23 ENCOUNTER — Ambulatory Visit: Payer: Medicare Other | Admitting: Psychiatry

## 2019-11-25 ENCOUNTER — Encounter (HOSPITAL_COMMUNITY): Payer: Self-pay

## 2019-11-28 ENCOUNTER — Ambulatory Visit (HOSPITAL_COMMUNITY)
Admission: RE | Admit: 2019-11-28 | Discharge: 2019-11-28 | Disposition: A | Discharge status: Home / Self Care | Payer: Medicare Other | Source: Ambulatory Visit | Attending: Interventional Cardiology | Admitting: Interventional Cardiology

## 2019-11-28 ENCOUNTER — Encounter (HOSPITAL_COMMUNITY): Payer: Self-pay

## 2019-11-28 ENCOUNTER — Other Ambulatory Visit: Payer: Self-pay

## 2019-11-28 DIAGNOSIS — I10 Essential (primary) hypertension: Secondary | ICD-10-CM | POA: Insufficient documentation

## 2019-11-28 DIAGNOSIS — R9431 Abnormal electrocardiogram [ECG] [EKG]: Secondary | ICD-10-CM | POA: Diagnosis not present

## 2019-11-28 DIAGNOSIS — I712 Thoracic aortic aneurysm, without rupture, unspecified: Secondary | ICD-10-CM

## 2019-11-28 DIAGNOSIS — Z8249 Family history of ischemic heart disease and other diseases of the circulatory system: Secondary | ICD-10-CM | POA: Diagnosis not present

## 2019-11-28 DIAGNOSIS — R072 Precordial pain: Secondary | ICD-10-CM | POA: Insufficient documentation

## 2019-11-28 MED ORDER — NITROGLYCERIN 0.4 MG SL SUBL
SUBLINGUAL_TABLET | SUBLINGUAL | Status: AC
  Filled 2019-11-28: qty 2

## 2019-11-28 MED ORDER — NITROGLYCERIN 0.4 MG SL SUBL
0.8000 mg | SUBLINGUAL_TABLET | Freq: Once | SUBLINGUAL | Status: AC
  Administered 2019-11-28: 0.8 mg via SUBLINGUAL

## 2019-11-28 MED ORDER — METOPROLOL TARTRATE 5 MG/5ML IV SOLN
5.0000 mg | INTRAVENOUS | Status: DC | PRN
  Administered 2019-11-28: 5 mg via INTRAVENOUS

## 2019-11-28 MED ORDER — IOHEXOL 350 MG/ML SOLN
80.0000 mL | Freq: Once | INTRAVENOUS | Status: AC | PRN
  Administered 2019-11-28: 100 mL via INTRAVENOUS

## 2019-11-28 MED ORDER — METOPROLOL TARTRATE 5 MG/5ML IV SOLN
INTRAVENOUS | Status: AC
  Filled 2019-11-28: qty 10

## 2019-12-07 ENCOUNTER — Other Ambulatory Visit: Payer: Self-pay

## 2019-12-07 ENCOUNTER — Encounter: Payer: Self-pay | Admitting: Psychiatry

## 2019-12-07 ENCOUNTER — Ambulatory Visit (INDEPENDENT_AMBULATORY_CARE_PROVIDER_SITE_OTHER): Payer: Medicare Other | Admitting: Psychiatry

## 2019-12-07 DIAGNOSIS — R251 Tremor, unspecified: Secondary | ICD-10-CM

## 2019-12-07 DIAGNOSIS — F5105 Insomnia due to other mental disorder: Secondary | ICD-10-CM

## 2019-12-07 DIAGNOSIS — F411 Generalized anxiety disorder: Secondary | ICD-10-CM | POA: Diagnosis not present

## 2019-12-07 DIAGNOSIS — F3342 Major depressive disorder, recurrent, in full remission: Secondary | ICD-10-CM

## 2019-12-07 MED ORDER — CLONAZEPAM 1 MG PO TABS: 1.5000 mg | ORAL_TABLET | Freq: Every evening | ORAL | 0 refills | Status: DC | PRN

## 2019-12-07 NOTE — Progress Notes (Signed)
Bryan Tyler [id number] Dec 22, 1945 74 y.o.  Subjective:   Patient ID:  Bryan Tyler is a 74 y.o. (DOB 05/08/1946) male.  Chief Complaint:  Chief Complaint  Patient presents with  . Follow-up  . Depression  . Anxiety  . Sleeping Problem    HPI Bryan Tyler presents to the office today for follow-up of history of major depression and anxiety and insomnia.  Last seen June 2020.  The only medication change was to reduce Abilify from 5 mg to 2 mg daily to see if tremor would improve.  He had residual anxiety but not much depression at the time.  Patient called April 22, 2019 asking for refill of clonazepam early indicating he had increased the dosages on his own to 1.5 mg instead of 1 mg.  He was informed he could not change the dosage of a controlled substance without our permission.  Last seen December 2020.  The following was noted: Tremor resolved with reduction in Abilify.  Wants to go through meds and their purpose. No history of RLS.   Ran out of Klonopin lately and only sleeping 3-4  Hours.  Usually sleeps oK with 1 mg nightly. Overall anxiety and depression and sleep are managed.  Because of polypharmacy we elected to try to discontinue buspirone and he was to let us know if he had any rebound anxiety.  December 07, 2019 appointment the following is noted: More trouble with sleep and Klonopin 1 mg no longer getting him to sleep and will take another 1/2 tablet.  Then runs out of the Klonopin early. No SE from 1.5 mg Klonopin. Sleep 7 hours nightly.  45 nap in day.   2 cups coffee AM, not daily other caffeine but sometimes tea with dinner.   Work 3 days week.  Anxiety and mood sx unde control. Depression controlled with meds.Pt reports that mood is Anxious and describes anxiety as controlled except when ran out of clonazepam.. Anxiety symptoms include: Excessive Worry,., worse at night lying down when not distracted.   Pt reports couldn't go to sleep before  clonazepam and when ran out. Has run out of it at times and can't sleep.  Pt reports that appetite is good. Pt reports that energy is good and good. Concentration is good. Suicidal thoughts:  denied by patient.  Past Psychiatric Medication Trials: Abilify 5 started 2017, Effexor 300, Buspirone 15 BID, Clonazepam 1 &1/2 mg nightly Gabapentin 800 nightly Belsomra Lorazepam Mirtazapine 30 Trazodone 150  Review of Systems:  Review of Systems  Cardiovascular:       Enlarged aorta with follow-up next month.  Neurological: Negative for dizziness, tremors and weakness.  Neuro said no Parkinson's dz.  Medications: I have reviewed the patient's current medications.  Current Outpatient Medications  Medication Sig Dispense Refill  . amLODipine (NORVASC) 2.5 MG tablet Take 2.5 mg by mouth daily.    . ARIPiprazole (ABILIFY) 2 MG tablet Take 1 tablet (2 mg total) by mouth daily. 90 tablet 1  . atorvastatin (LIPITOR) 40 MG tablet TAKE 1 TABLET EVERY EVENING 90 tablet 3  . clonazePAM (KLONOPIN) 1 MG tablet Take 1.5 tablets (1.5 mg total) by mouth at bedtime as needed for anxiety. 135 tablet 0  . gabapentin (NEURONTIN) 800 MG tablet Take 1 tablet (800 mg total) by mouth at bedtime. 90 tablet 1  . LEVOXYL 112 MCG tablet TAKE 1 TABLET DAILY 90 tablet 3  . losartan (COZAAR) 100 MG tablet Take 0.5 tablets (50 mg total) by  mouth daily. 90 tablet 3  . metoprolol tartrate (LOPRESSOR) 100 MG tablet Take 1 tablet by mouth 2 hours prior to Cardiac CT 1 tablet 0  . Multiple Vitamin (MULTIVITAMIN WITH MINERALS) TABS tablet Take 1 tablet by mouth daily.    . Naftifine HCl 2 % CREA Apply thin layer to affected area once daily for 2-3 weeks. 45 g 0  . omeprazole (PRILOSEC) 40 MG capsule Take 1 capsule (40 mg total) by mouth daily as needed (heartburn). 90 capsule 1  . oxymetazoline (ANEFRIN NASAL SPRAY) 0.05 % nasal spray Place 2 sprays into both nostrils 3 (three) times daily as needed for congestion.     .  tamsulosin (FLOMAX) 0.4 MG CAPS capsule Take 1 capsule (0.4 mg total) by mouth daily. (Patient taking differently: Take 0.4 mg by mouth every evening. ) 30 capsule 3  . venlafaxine XR (EFFEXOR-XR) 150 MG 24 hr capsule TAKE 2 CAPSULES DAILY WITH BREAKFAST 180 capsule 3  . methocarbamol (ROBAXIN) 500 MG tablet Take 1 tablet (500 mg total) by mouth every 8 (eight) hours as needed for muscle spasms. (Patient not taking: Reported on 12/07/2019) 50 tablet 1   No current facility-administered medications for this visit.    Medication Side Effects: Other: brief dizzy when stands up  Allergies:  Allergies  Allergen Reactions  . Codeine Nausea And Vomiting and Other (See Comments)    DIZZINESS  . Ciprofloxacin Other (See Comments)    Unknown    Past Medical History:  Diagnosis Date  . Adenocarcinoma of prostate ([place]) 04/16/2013  . Aneurysm, aorta, thoracic ([place])   . Anxiety   . Arthritis    "knees" (05/21/2017)  . Depression   . Diverticulosis   . Dyspnea    on exertion for a long period time  . GERD (gastroesophageal reflux disease)   . Hyperlipidemia   . Hypertension   . Hypothyroidism, postradioiodine therapy    1980's  . Nocturia   . Organic impotence   . OSA (obstructive sleep apnea)    NO CPAP--  S/P SURGERY  2002  . Osteoporosis   . Pneumonia 1970  . PONV (postoperative nausea and vomiting)   . Urge urinary incontinence   . Wears contact lenses     Family History  Problem Relation Age of Onset  . Cancer Mother        Pancreatic Cancer  . Heart disease Father 24       CABG    Social History   Socioeconomic History  . Marital status: Married    Spouse name: Not on file  . Number of children: Not on file  . Years of education: Not on file  . Highest education level: Not on file  Occupational History  . Occupation: Patent examiner  Tobacco Use  . Smoking status: Former Smoker    Packs/day: 1.00    Years: 20.00    Pack years: 20.00    Types: Cigarettes     Quit date: 08/11/1988    Years since quitting: 31.3  . Smokeless tobacco: Never Used  Substance and Sexual Activity  . Alcohol use: Yes    Comment: 05/21/2017 "1-2 beers/month"  . Drug use: No  . Sexual activity: Not Currently  Other Topics Concern  . Not on file  Social History Narrative  . Not on file   Social Determinants of Health   Financial Resource Strain:   . Difficulty of Paying Living Expenses:   Food Insecurity:   . Worried About Running  Out of Food in the Last Year:   . [place] in the Last Year:   Transportation Needs:   . Lack of Transportation (Medical):   Marland Kitchen Lack of Transportation (Non-Medical):   Physical Activity:   . Days of Exercise per Week:   . Minutes of Exercise per Session:   Stress:   . Feeling of Stress :   Social Connections:   . Frequency of Communication with Friends and Family:   . Frequency of Social Gatherings with Friends and Family:   . Attends Religious Services:   . Active Member of Clubs or Organizations:   . Attends Archivist Meetings:   Marland Kitchen Marital Status:   Intimate Partner Violence:   . Fear of Current or Ex-Partner:   . Emotionally Abused:   Marland Kitchen Physically Abused:   . Sexually Abused:     Past Medical History, Surgical history, Social history, and Family history were reviewed and updated as appropriate.   Please see review of systems for further details on the patient's review from today.   Objective:   Physical Exam:  There were no vitals taken for this visit.  Physical Exam Constitutional:      General: He is not in acute distress.    Appearance: He is well-developed.  Musculoskeletal:        General: No deformity.  Neurological:     Mental Status: He is alert and oriented to person, place, and time.     Motor: Tremor present.     Coordination: Coordination normal.     Comments: Tremor minimal  Psychiatric:        Attention and Perception: Attention and perception normal. He does not perceive  auditory or visual hallucinations.        Mood and Affect: Mood is anxious. Mood is not depressed. Affect is not labile, blunt, angry or inappropriate.        Speech: Speech normal.        Behavior: Behavior normal.        Thought Content: Thought content normal. Thought content does not include homicidal or suicidal ideation. Thought content does not include homicidal or suicidal plan.        Cognition and Memory: Cognition and memory normal.        Judgment: Judgment normal.     Comments: Insight intact. No delusions.      Lab Review:     Component Value Date/Time   NA 137 11/16/2019 1001   K 4.5 11/16/2019 1001   CL 101 11/16/2019 1001   CO2 25 11/16/2019 1001   GLUCOSE 96 11/16/2019 1001   GLUCOSE 91 05/03/2019 1054   BUN 12 11/16/2019 1001   CREATININE 0.86 11/16/2019 1001   CALCIUM 9.2 11/16/2019 1001   CALCIUM 9.8 07/09/2011 0819   PROT 7.0 05/03/2019 1054   ALBUMIN 4.6 05/03/2019 1054   AST 21 05/03/2019 1054   ALT 19 05/03/2019 1054   ALKPHOS 61 05/03/2019 1054   BILITOT 0.6 05/03/2019 1054   GFRNONAA 85 11/16/2019 1001   GFRAA 98 11/16/2019 1001       Component Value Date/Time   WBC 7.5 05/03/2019 1054   RBC 4.77 05/03/2019 1054   HGB 15.0 05/03/2019 1054   HCT 44.7 05/03/2019 1054   PLT 238.0 05/03/2019 1054   MCV 93.6 05/03/2019 1054   MCH 31.4 09/26/2018 0414   MCHC 33.5 05/03/2019 1054   RDW 14.3 05/03/2019 1054   LYMPHSABS 2.1 05/03/2019 1054   MONOABS  0.7 05/03/2019 1054   EOSABS 0.3 05/03/2019 1054   BASOSABS 0.1 05/03/2019 1054    No results found for: POCLITH, LITHIUM   No results found for: PHENYTOIN, PHENOBARB, VALPROATE, CBMZ   .res Assessment: Plan:    Zavion was seen today for follow-up, depression, anxiety and sleeping problem.  Diagnoses and all orders for this visit:  Recurrent major depressive disorder, in full remission ([place])  Insomnia due to mental condition -     clonazePAM (KLONOPIN) 1 MG tablet; Take 1.5 tablets (1.5  mg total) by mouth at bedtime as needed for anxiety.  Generalized anxiety disorder  Tremor of unknown origin    Greater than 50% of 30 min face to face time with patient was spent on counseling and coordination of care. We discussed that Lauretta Fortune has a long history of depression and anxiety the has been fairly well controlled in recent years.  He is on the maximum dosage of venlafaxine and when he had a recurrence of depression in 2017, aripiprazole 2 mg was added.  Sx still under control with reduction of Abilify from 5 mg to 2 mg daily and tremor largely resolved.   He has been on that same dosage since then and his depression has been in control and in remission.  His anxiety is residual and sometimes affects sleep but that is controlled by the combination of gabapentin and clonazepam and buspirone.   Extensive discussion of sleep hygiene.  No caffeine after 3 pm.    Consider weaning gabapentin if insomnia is controlled at next visit to reduce polypharmacy.  He asked about the purpose of each of the medications that he is taking including those from other medical providers.  These were discussed as well as the side effects.  It is likely that the dizziness which is orthostatic to the dizziness that he is having is coming from 1 of the blood pressure meds.  Suggest he discuss this with his primary care doctor.  We discussed the short-term risks associated with benzodiazepines including sedation and increased fall risk among others.  Discussed long-term side effect risk including dependence, potential withdrawal symptoms, and the potential eventual dose-related risk of dementia.  Also disc risk affecting balance and memory.  No higher dose than 1.5 mg but that dose will be allowed since failed other sleepers and tolerating it. He does not feel like he can do with any less clonazepam.  Cautioned again against exceeding dosage of clonazepam without my OK.  No problems off buspirone.  This  appointment was 30 minutes  Follow-up 4 to 6 months or sooner as needed  Lynder Parents, MD, DFAPA    No future appointments.  No orders of the defined types were placed in this encounter.   -------------------------------

## 2019-12-21 DIAGNOSIS — Z96611 Presence of right artificial shoulder joint: Secondary | ICD-10-CM | POA: Diagnosis not present

## 2019-12-21 DIAGNOSIS — Z471 Aftercare following joint replacement surgery: Secondary | ICD-10-CM | POA: Diagnosis not present

## 2019-12-26 DIAGNOSIS — H2513 Age-related nuclear cataract, bilateral: Secondary | ICD-10-CM | POA: Diagnosis not present

## 2019-12-26 DIAGNOSIS — H5010 Unspecified exotropia: Secondary | ICD-10-CM | POA: Diagnosis not present

## 2020-01-02 ENCOUNTER — Telehealth: Payer: Self-pay | Admitting: Physician Assistant

## 2020-01-02 ENCOUNTER — Encounter: Payer: Self-pay | Admitting: Physician Assistant

## 2020-01-02 ENCOUNTER — Ambulatory Visit (INDEPENDENT_AMBULATORY_CARE_PROVIDER_SITE_OTHER): Payer: Medicare Other | Admitting: Physician Assistant

## 2020-01-02 ENCOUNTER — Other Ambulatory Visit: Payer: Self-pay

## 2020-01-02 VITALS — BP 100/64 | HR 95 | Temp 98.3°F | Resp 16 | Ht 69.0 in | Wt 192.0 lb

## 2020-01-02 DIAGNOSIS — I951 Orthostatic hypotension: Secondary | ICD-10-CM | POA: Diagnosis not present

## 2020-01-02 DIAGNOSIS — I10 Essential (primary) hypertension: Secondary | ICD-10-CM | POA: Diagnosis not present

## 2020-01-02 NOTE — Telephone Encounter (Signed)
Patient scheduled for OV today at 3:30 with PCP

## 2020-01-02 NOTE — Progress Notes (Signed)
Patient presents to clinic today c/o 1 month of episodes of lightheadedness on standing from a seated position or sitting up quickly from a lying position.  Notes he will feel slightly off balance for a few seconds before this resolves.  Denies any chest pain, racing heart or true vertigo.  Started checking his blood pressure over the past week due to these episodes and notes it is running lower than it typical, averaging around [id number] systolic.  Denies any change to diet.  Is staying well-hydrated.  Is taking all 3 of his blood pressure medications as directed.  Denies any melena or hematochezia.  Denies shortness of breath.  Past Medical History:  Diagnosis Date  . Adenocarcinoma of prostate ([place]) 04/16/2013  . Aneurysm, aorta, thoracic ([place])   . Anxiety   . Arthritis    "knees" (05/21/2017)  . Depression   . Diverticulosis   . Dyspnea    on exertion for a long period time  . GERD (gastroesophageal reflux disease)   . Hyperlipidemia   . Hypertension   . Hypothyroidism, postradioiodine therapy    1980's  . Nocturia   . Organic impotence   . OSA (obstructive sleep apnea)    NO CPAP--  S/P SURGERY  2002  . Osteoporosis   . Pneumonia 1970  . PONV (postoperative nausea and vomiting)   . Urge urinary incontinence   . Wears contact lenses     Current Outpatient Medications on File Prior to Visit  Medication Sig Dispense Refill  . amLODipine (NORVASC) 2.5 MG tablet Take 2.5 mg by mouth daily.    . ARIPiprazole (ABILIFY) 2 MG tablet Take 1 tablet (2 mg total) by mouth daily. 90 tablet 1  . atorvastatin (LIPITOR) 40 MG tablet TAKE 1 TABLET EVERY EVENING 90 tablet 3  . clonazePAM (KLONOPIN) 1 MG tablet Take 1.5 tablets (1.5 mg total) by mouth at bedtime as needed for anxiety. 135 tablet 0  . gabapentin (NEURONTIN) 800 MG tablet Take 1 tablet (800 mg total) by mouth at bedtime. 90 tablet 1  . LEVOXYL 112 MCG tablet TAKE 1 TABLET DAILY 90 tablet 3  . losartan (COZAAR) 100 MG tablet Take  0.5 tablets (50 mg total) by mouth daily. 90 tablet 3  . metoprolol tartrate (LOPRESSOR) 100 MG tablet Take 1 tablet by mouth 2 hours prior to Cardiac CT 1 tablet 0  . Multiple Vitamin (MULTIVITAMIN WITH MINERALS) TABS tablet Take 1 tablet by mouth daily.    . Naftifine HCl 2 % CREA Apply thin layer to affected area once daily for 2-3 weeks. 45 g 0  . omeprazole (PRILOSEC) 40 MG capsule Take 1 capsule (40 mg total) by mouth daily as needed (heartburn). 90 capsule 1  . oxymetazoline (ANEFRIN NASAL SPRAY) 0.05 % nasal spray Place 2 sprays into both nostrils 3 (three) times daily as needed for congestion.     . tamsulosin (FLOMAX) 0.4 MG CAPS capsule Take 1 capsule (0.4 mg total) by mouth daily. (Patient taking differently: Take 0.4 mg by mouth every evening. ) 30 capsule 3  . venlafaxine XR (EFFEXOR-XR) 150 MG 24 hr capsule TAKE 2 CAPSULES DAILY WITH BREAKFAST 180 capsule 3   No current facility-administered medications on file prior to visit.    Allergies  Allergen Reactions  . Codeine Nausea And Vomiting and Other (See Comments)    DIZZINESS  . Ciprofloxacin Other (See Comments)    Unknown    Family History  Problem Relation Age of Onset  .  Cancer Mother        Pancreatic Cancer  . Heart disease Father 49       CABG    Social History   Socioeconomic History  . Marital status: Married    Spouse name: Not on file  . Number of children: Not on file  . Years of education: Not on file  . Highest education level: Not on file  Occupational History  . Occupation: Patent examiner  Tobacco Use  . Smoking status: Former Smoker    Packs/day: 1.00    Years: 20.00    Pack years: 20.00    Types: Cigarettes    Quit date: 08/11/1988    Years since quitting: 31.4  . Smokeless tobacco: Never Used  Substance and Sexual Activity  . Alcohol use: Yes    Comment: 05/21/2017 "1-2 beers/month"  . Drug use: No  . Sexual activity: Not Currently  Other Topics Concern  . Not on file    Social History Narrative  . Not on file   Social Determinants of Health   Financial Resource Strain:   . Difficulty of Paying Living Expenses:   Food Insecurity:   . Worried About Charity fundraiser in the Last Year:   . Arboriculturist in the Last Year:   Transportation Needs:   . Film/video editor (Medical):   Marland Kitchen Lack of Transportation (Non-Medical):   Physical Activity:   . Days of Exercise per Week:   . Minutes of Exercise per Session:   Stress:   . Feeling of Stress :   Social Connections:   . Frequency of Communication with Friends and Family:   . Frequency of Social Gatherings with Friends and Family:   . Attends Religious Services:   . Active Member of Clubs or Organizations:   . Attends Archivist Meetings:   Marland Kitchen Marital Status:    Review of Systems - See HPI.  All other ROS are negative.  BP 100/64   Pulse 95   Temp 98.3 F (36.8 C) (Temporal)   Resp 16   Ht 5\' 9"  (1.753 m)   Wt 192 lb (87.1 kg)   SpO2 98%   BMI 28.35 kg/m   Physical Exam Vitals reviewed.  Constitutional:      Appearance: Normal appearance.  HENT:     Head: Normocephalic and atraumatic.  Eyes:     Conjunctiva/sclera: Conjunctivae normal.  Cardiovascular:     Rate and Rhythm: Normal rate and regular rhythm.     Pulses: Normal pulses.     Heart sounds: Normal heart sounds.  Pulmonary:     Effort: Pulmonary effort is normal.     Breath sounds: Normal breath sounds.  Musculoskeletal:     Cervical back: Neck supple.  Neurological:     General: No focal deficit present.     Mental Status: He is alert and oriented to person, place, and time.  Psychiatric:        Mood and Affect: Mood normal.     Recent Results (from the past 2160 hour(s))  Basic metabolic panel     Status: None   Collection Time: 11/16/19 10:01 AM  Result Value Ref Range   Glucose 96 65 - 99 mg/dL   BUN 12 8 - 27 mg/dL   Creatinine, Ser 0.86 0.76 - 1.27 mg/dL   GFR calc non Af Amer 85 >59  mL/min/1.73   GFR calc Af Amer 98 >59 mL/min/1.73   BUN/Creatinine Ratio 14  10 - 24   Sodium 137 134 - 144 mmol/L   Potassium 4.5 3.5 - 5.2 mmol/L   Chloride 101 96 - 106 mmol/L   CO2 25 20 - 29 mmol/L   Calcium 9.2 8.6 - 10.2 mg/dL    Assessment/Plan: 1. Essential hypertension 2. Orthostatic hypotension Patient with history of essential hypertension currently on a regimen of metoprolol, amlodipine and losartan.  Now having episodes of positional lightheadedness.  Orthostatic vitals are positive today.  Examination otherwise unremarkable.  Will check CBC and BMP today to make sure there are no signs of anemia or electrolyte disturbance.  We will have patient continue metoprolol and amlodipine but will have him hold his losartan 50 mg once daily.  Continue checking daily blood pressure and recording.  We will plan on follow-up in office in 7 to 10 days for repeat assessment. - CBC w/Diff - Basic metabolic panel  This visit occurred during the SARS-CoV-2 public health emergency.  Safety protocols were in place, including screening questions prior to the visit, additional usage of staff PPE, and extensive cleaning of exam room while observing appropriate contact time as indicated for disinfecting solutions.     Leeanne Rio, PA-C

## 2020-01-02 NOTE — Patient Instructions (Signed)
Please go to the lab today for blood work.  I will call you with your results. We will alter treatment regimen(s) if indicated by your results.   Please hold (stop) the losartan for now. Continue other medications as directed. Keep hydrated.  Do not skip meals.   Continue checking BP daily as directed and recording. We will follow-up in 7-10 days for reassessment of BP, orthostatic vitals and symptoms.   Hang in there!

## 2020-01-02 NOTE — Telephone Encounter (Signed)
Patient called in today stating he was experiencing low bp and has been dizziness with bending over or standing.    Sent patient to triage.  Patient stated his BP was 107/77.  States he did take medication today.  Patient was advised to have an in office visit today or if not follow up with UC or ED.

## 2020-01-03 LAB — CBC WITH DIFFERENTIAL/PLATELET
Basophils Absolute: 0 10*3/uL (ref 0.0–0.1)
Basophils Relative: 0.4 % (ref 0.0–3.0)
Eosinophils Absolute: 0.3 10*3/uL (ref 0.0–0.7)
Eosinophils Relative: 3.8 % (ref 0.0–5.0)
HCT: 42.5 % (ref 39.0–52.0)
Hemoglobin: 14.2 g/dL (ref 13.0–17.0)
Lymphocytes Relative: 25.2 % (ref 12.0–46.0)
Lymphs Abs: 2.1 10*3/uL (ref 0.7–4.0)
MCHC: 33.4 g/dL (ref 30.0–36.0)
MCV: 92.4 fl (ref 78.0–100.0)
Monocytes Absolute: 0.9 10*3/uL (ref 0.1–1.0)
Monocytes Relative: 10.2 % (ref 3.0–12.0)
Neutro Abs: 5 10*3/uL (ref 1.4–7.7)
Neutrophils Relative %: 60.4 % (ref 43.0–77.0)
Platelets: 255 10*3/uL (ref 150.0–400.0)
RBC: 4.6 Mil/uL (ref 4.22–5.81)
RDW: 13.5 % (ref 11.5–15.5)
WBC: 8.3 10*3/uL (ref 4.0–10.5)

## 2020-01-03 LAB — BASIC METABOLIC PANEL
BUN: 14 mg/dL (ref 6–23)
CO2: 30 mEq/L (ref 19–32)
Calcium: 9.5 mg/dL (ref 8.4–10.5)
Chloride: 100 mEq/L (ref 96–112)
Creatinine, Ser: 0.84 mg/dL (ref 0.40–1.50)
GFR: 89.01 mL/min (ref >60.00)
Glucose, Bld: 115 mg/dL — ABNORMAL HIGH (ref 70–99)
Potassium: 3.9 mEq/L (ref 3.5–5.1)
Sodium: 138 mEq/L (ref 135–145)

## 2020-02-07 ENCOUNTER — Encounter: Payer: Self-pay | Admitting: Physician Assistant

## 2020-02-07 ENCOUNTER — Telehealth (INDEPENDENT_AMBULATORY_CARE_PROVIDER_SITE_OTHER): Payer: Medicare Other | Admitting: Physician Assistant

## 2020-02-07 DIAGNOSIS — J069 Acute upper respiratory infection, unspecified: Secondary | ICD-10-CM

## 2020-02-07 MED ORDER — BENZONATATE 100 MG PO CAPS: 100.0000 mg | ORAL_CAPSULE | Freq: Three times a day (TID) | ORAL | 0 refills | Status: DC | PRN

## 2020-02-07 NOTE — Patient Instructions (Signed)
Instructions have been sent to [place].

## 2020-02-07 NOTE — Progress Notes (Signed)
Virtual Visit via Video   I connected with patient on 02/07/20 at  2:30 PM EDT by a video enabled telemedicine application and verified that I am speaking with the correct person using two identifiers.  Location patient: Home Location provider: [facility], Office Persons participating in the virtual visit: Patient, Provider, [place] (Patina Moore)  I discussed the limitations of evaluation and management by telemedicine and the availability of in person appointments. The patient expressed understanding and agreed to proceed.  Subjective:   HPI:   Patient presents via [facility] today complaining of a couple days of chest congestion with cough that is mainly dry but sometimes productive of clear sputum, nasal congestion, headache and body aches.  Patient denies fever, chills, chest pain, shortness of breath, sinus pressure/pain, ear pain, loss of taste or smell or GI symptoms.  Denies sick contact.  He and his wife just returned from visiting their daughter in [place].  Did take a plane but notes they were masking distance the entire time.  Patient has also had both Covid vaccines.  Patient has been trying to keep hydrated and resting but has not taken anything for symptoms.  ROS:   See pertinent positives and negatives per HPI.  Patient Active Problem List   Diagnosis Date Noted  . History of total knee replacement, right 12/17/2018  . S/p reverse total shoulder arthroplasty 05/21/2017  . HTN (hypertension) 10/16/2015  . Adenocarcinoma of prostate ([place]) 04/16/2013  . Nonspecific abnormal electrocardiogram (ECG) (EKG) 01/24/2013  . Renal cysts, acquired, bilateral 07/15/2011  . HEARING LOSS 09/23/2010  . SLEEP APNEA, OBSTRUCTIVE 03/05/2010  . ORGANIC IMPOTENCE 02/22/2009  . PARESTHESIA 02/22/2009  . Internal hemorrhoids 10/31/2008  . FOLLICULITIS, CHRONIC 78/46/9629  . HYPOTHYROIDISM, POST-RADIOACTIVE IODINE 10/21/2007  . Dyslipidemia 10/21/2007  . Anxiety and  depression 10/21/2007  . GERD 10/21/2007  . Osteoporosis 10/21/2007    Social History   Tobacco Use  . Smoking status: Former Smoker    Packs/day: 1.00    Years: 20.00    Pack years: 20.00    Types: Cigarettes    Quit date: 08/11/1988    Years since quitting: 31.5  . Smokeless tobacco: Never Used  Substance Use Topics  . Alcohol use: Yes    Comment: 05/21/2017 "1-2 beers/month"    Current Outpatient Medications:  .  amLODipine (NORVASC) 2.5 MG tablet, Take 2.5 mg by mouth daily., Disp: , Rfl:  .  ARIPiprazole (ABILIFY) 2 MG tablet, Take 1 tablet (2 mg total) by mouth daily., Disp: 90 tablet, Rfl: 1 .  atorvastatin (LIPITOR) 40 MG tablet, TAKE 1 TABLET EVERY EVENING, Disp: 90 tablet, Rfl: 3 .  clonazePAM (KLONOPIN) 1 MG tablet, Take 1.5 tablets (1.5 mg total) by mouth at bedtime as needed for anxiety., Disp: 135 tablet, Rfl: 0 .  gabapentin (NEURONTIN) 800 MG tablet, Take 1 tablet (800 mg total) by mouth at bedtime., Disp: 90 tablet, Rfl: 1 .  LEVOXYL 112 MCG tablet, TAKE 1 TABLET DAILY, Disp: 90 tablet, Rfl: 3 .  losartan (COZAAR) 100 MG tablet, Take 0.5 tablets (50 mg total) by mouth daily., Disp: 90 tablet, Rfl: 3 .  metoprolol tartrate (LOPRESSOR) 100 MG tablet, Take 1 tablet by mouth 2 hours prior to Cardiac CT, Disp: 1 tablet, Rfl: 0 .  Multiple Vitamin (MULTIVITAMIN WITH MINERALS) TABS tablet, Take 1 tablet by mouth daily., Disp: , Rfl:  .  Naftifine HCl 2 % CREA, Apply thin layer to affected area once daily for 2-3 weeks., Disp: 45 g,  Rfl: 0 .  omeprazole (PRILOSEC) 40 MG capsule, Take 1 capsule (40 mg total) by mouth daily as needed (heartburn)., Disp: 90 capsule, Rfl: 1 .  oxymetazoline (ANEFRIN NASAL SPRAY) 0.05 % nasal spray, Place 2 sprays into both nostrils 3 (three) times daily as needed for congestion. , Disp: , Rfl:  .  tamsulosin (FLOMAX) 0.4 MG CAPS capsule, Take 1 capsule (0.4 mg total) by mouth daily. (Patient taking differently: Take 0.4 mg by mouth every evening.  ), Disp: 30 capsule, Rfl: 3 .  venlafaxine XR (EFFEXOR-XR) 150 MG 24 hr capsule, TAKE 2 CAPSULES DAILY WITH BREAKFAST, Disp: 180 capsule, Rfl: 3  Allergies  Allergen Reactions  . Codeine Nausea And Vomiting and Other (See Comments)    DIZZINESS  . Ciprofloxacin Other (See Comments)    Unknown    Objective:   There were no vitals taken for this visit.  Patient is well-developed, well-nourished in no acute distress.  Resting comfortably at home.  Head is normocephalic, atraumatic.  No labored breathing.  Speech is clear and coherent with logical content.  Patient is alert and oriented at baseline.   Assessment and Plan:   1. Viral URI with cough Couple of days of symptoms.  Symptoms mild overall.  No alarm signs or symptoms.  Discussed with patient even though he has been following social distancing practices and has had both immunizations, the recent flight does put him at elevated risk for coronavirus.  Should consider being tested.  Testing locations reviewed.  Will have patient increase fluids and get plenty of rest.  Start plain Mucinex.  Rx Tessalon for cough.  He is aware that if anything worsens he will need Covid testing and in person evaluation at nearest urgent care or emergency room.  Patient voiced understanding and agreement with plan.    Leeanne Rio, [place] 02/07/2020

## 2020-02-29 ENCOUNTER — Ambulatory Visit (INDEPENDENT_AMBULATORY_CARE_PROVIDER_SITE_OTHER): Payer: Medicare Other | Admitting: Physician Assistant

## 2020-02-29 ENCOUNTER — Other Ambulatory Visit: Payer: Self-pay

## 2020-02-29 ENCOUNTER — Encounter: Payer: Self-pay | Admitting: Physician Assistant

## 2020-02-29 VITALS — BP 110/80 | HR 90 | Temp 98.0°F | Resp 16 | Ht 69.0 in | Wt 192.0 lb

## 2020-02-29 DIAGNOSIS — J189 Pneumonia, unspecified organism: Secondary | ICD-10-CM

## 2020-02-29 NOTE — Progress Notes (Signed)
Patient presents to clinic today to follow-up regarding community-acquired pneumonia.  Patient was seen at [facility] on 02/19/2020 at which time examination a chest x-ray was obtained revealing bilateral lower lobe pneumonia.  Patient was started on a regimen of doxycycline 100 mg twice daily x7 days along with prednisone taper.  Patient endorses taking all medications as directed, completing course of both medications.  Notes substantial improvement in his symptoms, feeling much better.  Denies fever or chills.  Has had some mild chest wall tenderness during coughing spells only.  Denies any pleuritic pain.  Patient notes that cough is still present but vastly improved.  No colored or thick sputum.  Patient denies any chest tightness or wheezing.  Still noting a little bit of shortness of breath when really exerting himself but not at rest.  Notes he has been eating and staying hydrated..   Past Medical History:  Diagnosis Date  . Adenocarcinoma of prostate ([place]) 04/16/2013  . Aneurysm, aorta, thoracic ([place])   . Anxiety   . Arthritis    "knees" (05/21/2017)  . Depression   . Diverticulosis   . Dyspnea    on exertion for a long period time  . GERD (gastroesophageal reflux disease)   . Hyperlipidemia   . Hypertension   . Hypothyroidism, postradioiodine therapy    1980's  . Nocturia   . Organic impotence   . OSA (obstructive sleep apnea)    NO CPAP--  S/P SURGERY  2002  . Osteoporosis   . Pneumonia 1970  . PONV (postoperative nausea and vomiting)   . Urge urinary incontinence   . Wears contact lenses     Current Outpatient Medications on File Prior to Visit  Medication Sig Dispense Refill  . amLODipine (NORVASC) 2.5 MG tablet Take 2.5 mg by mouth daily.    . ARIPiprazole (ABILIFY) 2 MG tablet Take 1 tablet (2 mg total) by mouth daily. 90 tablet 1  . atorvastatin (LIPITOR) 40 MG tablet TAKE 1 TABLET EVERY EVENING 90 tablet 3  . clonazePAM (KLONOPIN) 1 MG tablet  Take 1.5 tablets (1.5 mg total) by mouth at bedtime as needed for anxiety. 135 tablet 0  . gabapentin (NEURONTIN) 800 MG tablet Take 1 tablet (800 mg total) by mouth at bedtime. 90 tablet 1  . LEVOXYL 112 MCG tablet TAKE 1 TABLET DAILY 90 tablet 3  . losartan (COZAAR) 100 MG tablet Take 0.5 tablets (50 mg total) by mouth daily. 90 tablet 3  . metoprolol tartrate (LOPRESSOR) 100 MG tablet Take 1 tablet by mouth 2 hours prior to Cardiac CT 1 tablet 0  . Multiple Vitamin (MULTIVITAMIN WITH MINERALS) TABS tablet Take 1 tablet by mouth daily.    . Naftifine HCl 2 % CREA Apply thin layer to affected area once daily for 2-3 weeks. 45 g 0  . omeprazole (PRILOSEC) 40 MG capsule Take 1 capsule (40 mg total) by mouth daily as needed (heartburn). 90 capsule 1  . oxymetazoline (ANEFRIN NASAL SPRAY) 0.05 % nasal spray Place 2 sprays into both nostrils 3 (three) times daily as needed for congestion.     . tamsulosin (FLOMAX) 0.4 MG CAPS capsule Take 1 capsule (0.4 mg total) by mouth daily. (Patient taking differently: Take 0.4 mg by mouth every evening. ) 30 capsule 3  . venlafaxine XR (EFFEXOR-XR) 150 MG 24 hr capsule TAKE 2 CAPSULES DAILY WITH BREAKFAST 180 capsule 3   No current facility-administered medications on file prior to visit.    Allergies  Allergen Reactions  . Codeine Nausea And Vomiting and Other (See Comments)    DIZZINESS  . Ciprofloxacin Other (See Comments)    Unknown    Family History  Problem Relation Age of Onset  . Cancer Mother        Pancreatic Cancer  . Heart disease Father 6       CABG    Social History   Socioeconomic History  . Marital status: Married    Spouse name: Not on file  . Number of children: Not on file  . Years of education: Not on file  . Highest education level: Not on file  Occupational History  . Occupation: Patent examiner  Tobacco Use  . Smoking status: Former Smoker    Packs/day: 1.00    Years: 20.00    Pack years: 20.00    Types:  Cigarettes    Quit date: 08/11/1988    Years since quitting: 31.5  . Smokeless tobacco: Never Used  Vaping Use  . Vaping Use: Never used  Substance and Sexual Activity  . Alcohol use: Yes    Comment: 05/21/2017 "1-2 beers/month"  . Drug use: No  . Sexual activity: Not Currently  Other Topics Concern  . Not on file  Social History Narrative  . Not on file   Social Determinants of Health   Financial Resource Strain:   . Difficulty of Paying Living Expenses:   Food Insecurity:   . Worried About Charity fundraiser in the Last Year:   . Arboriculturist in the Last Year:   Transportation Needs:   . Film/video editor (Medical):   Marland Kitchen Lack of Transportation (Non-Medical):   Physical Activity:   . Days of Exercise per Week:   . Minutes of Exercise per Session:   Stress:   . Feeling of Stress :   Social Connections:   . Frequency of Communication with Friends and Family:   . Frequency of Social Gatherings with Friends and Family:   . Attends Religious Services:   . Active Member of Clubs or Organizations:   . Attends Archivist Meetings:   Marland Kitchen Marital Status:    Review of Systems - See HPI.  All other ROS are negative.  Wt 192 lb (87.1 kg)   BMI 28.35 kg/m   Physical Exam Vitals reviewed.  Constitutional:      Appearance: Normal appearance.  HENT:     Head: Normocephalic and atraumatic.     Right Ear: Tympanic membrane normal.     Left Ear: Tympanic membrane normal.  Cardiovascular:     Rate and Rhythm: Normal rate and regular rhythm.     Pulses: Normal pulses.     Heart sounds: Normal heart sounds.  Pulmonary:     Breath sounds: Examination of the left-lower field reveals wheezing. Wheezing present. No decreased breath sounds, rhonchi or rales.  Musculoskeletal:     Cervical back: Neck supple.  Neurological:     General: No focal deficit present.     Mental Status: He is alert.     Recent Results (from the past 2160 hour(s))  CBC w/Diff     Status:  None   Collection Time: 01/02/20  4:08 PM  Result Value Ref Range   WBC 8.3 4.0 - 10.5 K/uL   RBC 4.60 4.22 - 5.81 Mil/uL   Hemoglobin 14.2 13.0 - 17.0 g/dL   HCT 42.5 39 - 52 %   MCV 92.4 78.0 - 100.0 fl  MCHC 33.4 30.0 - 36.0 g/dL   RDW 13.5 11.5 - 15.5 %   Platelets 255.0 150 - 400 K/uL   Neutrophils Relative % 60.4 43 - 77 %   Lymphocytes Relative 25.2 12 - 46 %   Monocytes Relative 10.2 3 - 12 %   Eosinophils Relative 3.8 0 - 5 %   Basophils Relative 0.4 0 - 3 %   Neutro Abs 5.0 1.4 - 7.7 K/uL   Lymphs Abs 2.1 0.7 - 4.0 K/uL   Monocytes Absolute 0.9 0 - 1 K/uL   Eosinophils Absolute 0.3 0 - 0 K/uL   Basophils Absolute 0.0 0 - 0 K/uL  Basic metabolic panel     Status: Abnormal   Collection Time: 01/02/20  4:08 PM  Result Value Ref Range   Sodium 138 135 - 145 mEq/L   Potassium 3.9 3.5 - 5.1 mEq/L   Chloride 100 96 - 112 mEq/L   CO2 30 19 - 32 mEq/L   Glucose, Bld 115 (H) 70 - 99 mg/dL   BUN 14 6 - 23 mg/dL   Creatinine, Ser 0.84 0.40 - 1.50 mg/dL   GFR 89.01 >60.00 mL/min   Calcium 9.5 8.4 - 10.5 mg/dL    Assessment/Plan: 1. Pneumonia of both lower lobes due to infectious organism Clinically resolving.  Patient afebrile.  Vitals stable with oxygen saturation at 98% on room air.  Lung examination reveals faint wheeze of left lung base.  Otherwise lungs are clear to auscultation.  Examination otherwise unremarkable.  Discussed with patient that cough is typically the last symptom to lead but should continue to improve from this point forward.  We will have him continue good hydration and rest.  Recommend OTC Mucinex DM for residual congestion and mild cough.  He has been encouraged to use his albuterol inhaler as directed if needed for chest tightness or wheezing.  Strict return precautions reviewed with patient.  Patient voiced understanding and agreement with the plan.  Written instructions were provided to the patient at the end of visit.  This visit occurred during the  SARS-CoV-2 public health emergency.  Safety protocols were in place, including screening questions prior to the visit, additional usage of staff PPE, and extensive cleaning of exam room while observing appropriate contact time as indicated for disinfecting solutions.     Leeanne Rio, PA-C

## 2020-02-29 NOTE — Patient Instructions (Signed)
Please keep well-hydrated and get plenty of rest.  Ok to use OTC Mucinex-Dm for congestion and cough. Ok to use OTC Tylenol if needed.  Use the albuterol inhaler as directed if needed.   Let me know if symptoms are not continuing to resolve.    How to Use a Metered Dose Inhaler A metered dose inhaler is a handheld device for taking medicine that must be breathed into the lungs (inhaled). The device can be used to deliver a variety of inhaled medicines, including:  Quick relief or rescue medicines, such as bronchodilators.  Controller medicines, such as corticosteroids. The medicine is delivered by pushing down on a metal canister to release a preset amount of spray and medicine. Each device contains the amount of medicine that is needed for a preset number of uses (inhalations). Your health care provider may recommend that you use a spacer with your inhaler to help you take the medicine more effectively. A spacer is a plastic tube with a mouthpiece on one end and an opening that connects to the inhaler on the other end. A spacer holds the medicine in a tube for a short time, which allows you to inhale more medicine. What are the risks? If you do not use your inhaler correctly, medicine might not reach your lungs to help you breathe. Inhaler medicine can cause side effects, such as:  Mouth or throat infection.  Cough.  Hoarseness.  Headache.  Nausea and vomiting.  Lung infection (pneumonia) in people who have a lung condition called COPD. How to use a metered dose inhaler without a spacer  1. Remove the cap from the inhaler. 2. If you are using the inhaler for the first time, shake it for 5 seconds, turn it away from your face, then release 4 puffs into the air. This is called priming. 3. Shake the inhaler for 5 seconds. 4. Position the inhaler so the top of the canister faces up. 5. Put your index finger on the top of the medicine canister. Support the bottom of the inhaler with  your thumb. 6. Breathe out normally and as completely as possible, away from the inhaler. 7. Either place the inhaler between your teeth and close your lips tightly around the mouthpiece, or hold the inhaler 1-2 inches (2.5-5 cm) away from your open mouth. Keep your tongue down out of the way. If you are unsure which technique to use, ask your health care provider. 8. Press the canister down with your index finger to release the medicine, then inhale deeply and slowly through your mouth (not your nose) until your lungs are completely filled. Inhaling should take 4-6 seconds. 9. Hold the medicine in your lungs for 5-10 seconds (10 seconds is best). This helps the medicine get into the small airways of your lungs. 10. With your lips in a tight circle (pursed), breathe out slowly. 11. Repeat steps 3-10 until you have taken the number of puffs that your health care provider directed. Wait about 1 minute between puffs or as directed. 12. Put the cap on the inhaler. 13. If you are using a steroid inhaler, rinse your mouth with water, gargle, and spit out the water. Do not swallow the water. How to use a metered dose inhaler with a spacer  1. Remove the cap from the inhaler. 2. If you are using the inhaler for the first time, shake it for 5 seconds, turn it away from your face, then release 4 puffs into the air. This is called priming.  3. Shake the inhaler for 5 seconds. 4. Place the open end of the spacer onto the inhaler mouthpiece. 5. Position the inhaler so the top of the canister faces up and the spacer mouthpiece faces you. 6. Put your index finger on the top of the medicine canister. Support the bottom of the inhaler and the spacer with your thumb. 7. Breathe out normally and as completely as possible, away from the spacer. 8. Place the spacer between your teeth and close your lips tightly around it. Keep your tongue down out of the way. 9. Press the canister down with your index finger to release  the medicine, then inhale deeply and slowly through your mouth (not your nose) until your lungs are completely filled. Inhaling should take 4-6 seconds. 10. Hold the medicine in your lungs for 5-10 seconds (10 seconds is best). This helps the medicine get into the small airways of your lungs. 11. With your lips in a tight circle (pursed), breathe out slowly. 12. Repeat steps 3-11 until you have taken the number of puffs that your health care provider directed. Wait about 1 minute between puffs or as directed. 13. Remove the spacer from the inhaler and put the cap on the inhaler. 14. If you are using a steroid inhaler, rinse your mouth with water, gargle, and spit out the water. Do not swallow the water. Follow these instructions at home:  Take your inhaled medicine only as told by your health care provider. Do not use the inhaler more than directed by your health care provider.  Keep all follow-up visits as told by your health care provider. This is important.  If your inhaler has a counter, you can check it to determine how full your inhaler is. If your inhaler does not have a counter, ask your health care provider when you will need to refill your inhaler and write the refill date on a calendar or on your inhaler canister. Note that you cannot know when an inhaler is empty by shaking it.  Follow directions on the package insert for care and cleaning of your inhaler and spacer. Contact a health care provider if:  Symptoms are only partially relieved with your inhaler.  You are having trouble using your inhaler.  You have an increase in phlegm.  You have headaches. Get help right away if:  You feel little or no relief after using your inhaler.  You have dizziness.  You have a fast heart rate.  You have chills or a fever.  You have night sweats.  There is blood in your phlegm. Summary  A metered dose inhaler is a handheld device for taking medicine that must be breathed into the  lungs (inhaled).  The medicine is delivered by pushing down on a metal canister to release a preset amount of spray and medicine.  Each device contains the amount of medicine that is needed for a preset number of uses (inhalations). This information is not intended to replace advice given to you by your health care provider. Make sure you discuss any questions you have with your health care provider. Document Revised: 07/10/2017 Document Reviewed: 06/17/2016 Elsevier Patient Education  2020 [employer].

## 2020-03-10 DIAGNOSIS — J189 Pneumonia, unspecified organism: Secondary | ICD-10-CM | POA: Diagnosis not present

## 2020-03-22 ENCOUNTER — Ambulatory Visit: Payer: Medicare Other | Admitting: Cardiothoracic Surgery

## 2020-03-28 ENCOUNTER — Telehealth: Payer: Self-pay | Admitting: Physician Assistant

## 2020-03-28 DIAGNOSIS — J189 Pneumonia, unspecified organism: Secondary | ICD-10-CM | POA: Diagnosis not present

## 2020-03-28 NOTE — Telephone Encounter (Signed)
Patient is asking to do a TOC from [place] to Dr.Parker as it would be easier for him and his wife to come to one office.

## 2020-03-28 NOTE — Telephone Encounter (Signed)
Fine with me

## 2020-03-29 NOTE — Telephone Encounter (Signed)
LVM asking patient to give the office a call back to get scheduled.

## 2020-03-29 NOTE — Telephone Encounter (Signed)
[place] with me.   Algis Greenhouse. [name], MD 03/29/2020 10:41 AM

## 2020-04-02 ENCOUNTER — Ambulatory Visit (INDEPENDENT_AMBULATORY_CARE_PROVIDER_SITE_OTHER): Payer: Medicare Other | Admitting: Family Medicine

## 2020-04-02 ENCOUNTER — Other Ambulatory Visit: Payer: Self-pay

## 2020-04-02 ENCOUNTER — Encounter: Payer: Self-pay | Admitting: Family Medicine

## 2020-04-02 VITALS — BP 128/87 | HR 90 | Temp 97.7°F | Ht 69.0 in | Wt 193.6 lb

## 2020-04-02 DIAGNOSIS — E785 Hyperlipidemia, unspecified: Secondary | ICD-10-CM

## 2020-04-02 DIAGNOSIS — F419 Anxiety disorder, unspecified: Secondary | ICD-10-CM | POA: Diagnosis not present

## 2020-04-02 DIAGNOSIS — E018 Other iodine-deficiency related thyroid disorders and allied conditions: Secondary | ICD-10-CM | POA: Diagnosis not present

## 2020-04-02 DIAGNOSIS — F329 Major depressive disorder, single episode, unspecified: Secondary | ICD-10-CM

## 2020-04-02 DIAGNOSIS — I10 Essential (primary) hypertension: Secondary | ICD-10-CM

## 2020-04-02 DIAGNOSIS — C61 Malignant neoplasm of prostate: Secondary | ICD-10-CM

## 2020-04-02 DIAGNOSIS — I712 Thoracic aortic aneurysm, without rupture, unspecified: Secondary | ICD-10-CM | POA: Insufficient documentation

## 2020-04-02 DIAGNOSIS — F32A Depression, unspecified: Secondary | ICD-10-CM

## 2020-04-02 NOTE — Patient Instructions (Signed)
It was very nice to see you today!  It is normal for x-rays to appear normal after pneumonia for several weeks.  I am glad that you are continuing to feel better.  You should continue to improve over the next few weeks.  No other changes today.  I will see you back in 6 to 8 weeks for your annual check with blood work.  Please come back see me sooner if needed.  Take care, Dr [name]  Please try these tips to maintain a healthy lifestyle:   Eat at least 3 REAL meals and 1-2 snacks per day.  Aim for no more than 5 hours between eating.  If you eat breakfast, please do so within one hour of getting up.    Each meal should contain half fruits/vegetables, one quarter protein, and one quarter carbs (no bigger than a computer mouse)   Cut down on sweet beverages. This includes juice, soda, and sweet tea.     Drink at least 1 glass of water with each meal and aim for at least 8 glasses per day   Exercise at least 150 minutes every week.

## 2020-04-02 NOTE — Assessment & Plan Note (Signed)
Stable.  Continue Lipitor 40 mg daily.  We will need to check lipids next blood draw.

## 2020-04-02 NOTE — Assessment & Plan Note (Signed)
Continue management per cardiothoracic surgery.

## 2020-04-02 NOTE — Progress Notes (Signed)
Bryan Tyler is a 74 y.o. male who presents today for an office visit.  He is transferring care.   Assessment/Plan:  New/Acute Problems: Pneumonia Normal lung exam today.  Still has some fatigue but seems to be overall recovering normally.  Discussed with patient and wife that x-ray findings typically do not change until at least 6 weeks after pneumonia.  Will continue conservative management.  Anticipate continued improvement over the next few weeks.  If has relapse in symptoms or if symptoms do not continue to improve would consider CT scan  Chronic Problems Addressed Today: HTN (hypertension) At goal off medications.  Thoracic aortic aneurysm without rupture [facility]) Continue management per cardiothoracic surgery.  Anxiety and depression Continue Abilify, clonazepam, gabapentin, and venlafaxine per psychiatry.  Dyslipidemia Stable.  Continue Lipitor 40 mg daily.  We will need to check lipids next blood draw.  HYPOTHYROIDISM, POST-RADIOACTIVE IODINE Continue levothyroxine 112 mcg daily.  Check TSH with next blood draw.  Adenocarcinoma of prostate ([place]) Stable.  Continue management per urology.     Subjective:  HPI:  Patient is here to transfer care.  He was diagnosed with pneumonia about a month ago.  Has been seen at urgent care 3 times with 3 separate x-rays each which showed infiltrates.  He is also had 3 rounds of antibiotics including doxycycline, azithromycin, and amoxicillin.  Overall symptoms seem to be improving.  No shortness of breath.  Still some fatigue.  His stable, chronic medical conditions are outlined below:   # Essential Hypertension - Not currently on any medications.   # Dyslipidemia  - On lipitor 40mg  daily and tolerating well  # Hypothyroidism - On levoxyl 166mcg daily and tolerating well  # Anxiety / Depression - Follows with psychiatry - On abilify 2mg , clonazepam 1.5mg  nightly as needed, venlafaxine 300mg  daily, and gabapentin 800mg   nightly  # History of prostate cancer / BPH - Follows with urology - On flomax 0.4mg  daily  PMH:  The following were reviewed and entered/updated in epic: Past Medical History:  Diagnosis Date  . Adenocarcinoma of prostate ([place]) 04/16/2013  . Aneurysm, aorta, thoracic ([place])   . Anxiety   . Arthritis    "knees" (05/21/2017)  . Depression   . Diverticulosis   . Dyspnea    on exertion for a long period time  . GERD (gastroesophageal reflux disease)   . Hyperlipidemia   . Hypertension   . Hypothyroidism, postradioiodine therapy    1980's  . Nocturia   . Organic impotence   . OSA (obstructive sleep apnea)    NO CPAP--  S/P SURGERY  2002  . Osteoporosis   . Pneumonia 1970  . PONV (postoperative nausea and vomiting)   . Urge urinary incontinence   . Wears contact lenses    Patient Active Problem List   Diagnosis Date Noted  . Thoracic aortic aneurysm without rupture ([place]) 04/02/2020  . History of total knee replacement, right 12/17/2018  . S/p reverse total shoulder arthroplasty 05/21/2017  . HTN (hypertension) 10/16/2015  . Adenocarcinoma of prostate ([place]) 04/16/2013  . Renal cysts, acquired, bilateral 07/15/2011  . HEARING LOSS 09/23/2010  . SLEEP APNEA, OBSTRUCTIVE 03/05/2010  . ORGANIC IMPOTENCE 02/22/2009  . PARESTHESIA 02/22/2009  . Internal hemorrhoids 10/31/2008  . HYPOTHYROIDISM, POST-RADIOACTIVE IODINE 10/21/2007  . Dyslipidemia 10/21/2007  . Anxiety and depression 10/21/2007  . GERD 10/21/2007  . Osteoporosis 10/21/2007   Past Surgical History:  Procedure Laterality Date  . HEMORRHOIDECTOMY WITH HEMORRHOID BANDING  2007  .  INGUINAL HERNIA REPAIR Bilateral    x4  (2 each side)  . INSERTION PROSTATE RADIATION SEED    . KNEE ARTHROSCOPY Right 2012  . NASAL SEPTUM SURGERY  1982   w/ rhinoplasty  . PROSTATE BIOPSY    . RADIOACTIVE SEED IMPLANT N/A 05/30/2013   Procedure: RADIOACTIVE SEED IMPLANT;  Surgeon: Hanley Ben, MD;  Location: [facility];  Service: Urology;  Laterality: N/A;  . REVERSE SHOULDER ARTHROPLASTY Right 05/21/2017  . REVERSE SHOULDER ARTHROPLASTY Right 05/21/2017  . REVERSE SHOULDER ARTHROPLASTY Right 05/21/2017   Procedure: REVERSE RIGHT SHOULDER ARTHROPLASTY;  Surgeon: Justice Britain, MD;  Location: [facility];  Service: Orthopedics;  Laterality: Right;  . RHINOPLASTY  1982   w/w/ septoplasty  . SHOULDER ARTHROSCOPY W/ ROTATOR CUFF REPAIR Right 2004  . SHOULDER ARTHROSCOPY W/ ROTATOR CUFF REPAIR Left 2016  . TONSILLECTOMY  AS CHILD  . TOTAL KNEE ARTHROPLASTY Right 09/24/2018   Procedure: TOTAL KNEE ARTHROPLASTY;  Surgeon: Sydnee Cabal, MD;  Location: [facility];  Service: Orthopedics;  Laterality: Right;  Adductor Block  . UVULOPALATOPHARYNGOPLASTY  2002    Family History  Problem Relation Age of Onset  . Cancer Mother        Pancreatic Cancer  . Heart disease Father 55       CABG    Medications- reviewed and updated Current Outpatient Medications  Medication Sig Dispense Refill  . albuterol (ACCUNEB) 0.63 MG/3ML nebulizer solution Take 1 ampule by nebulization every 6 (six) hours as needed for wheezing.    . ARIPiprazole (ABILIFY) 2 MG tablet Take 1 tablet (2 mg total) by mouth daily. 90 tablet 1  . atorvastatin (LIPITOR) 40 MG tablet TAKE 1 TABLET EVERY EVENING 90 tablet 3  . clonazePAM (KLONOPIN) 1 MG tablet Take 1.5 tablets (1.5 mg total) by mouth at bedtime as needed for anxiety. 135 tablet 0  . gabapentin (NEURONTIN) 800 MG tablet Take 1 tablet (800 mg total) by mouth at bedtime. 90 tablet 1  . LEVOXYL 112 MCG tablet TAKE 1 TABLET DAILY 90 tablet 3  . Multiple Vitamin (MULTIVITAMIN WITH MINERALS) TABS tablet Take 1 tablet by mouth daily.    . Naftifine HCl 2 % CREA Apply thin layer to affected area once daily for 2-3 weeks. 45 g 0  . omeprazole (PRILOSEC) 40 MG capsule Take 1 capsule (40 mg total) by mouth daily as needed (heartburn). 90 capsule 1  . oxymetazoline (ANEFRIN NASAL SPRAY) 0.05  % nasal spray Place 2 sprays into both nostrils 3 (three) times daily as needed for congestion.     . tamsulosin (FLOMAX) 0.4 MG CAPS capsule Take 1 capsule (0.4 mg total) by mouth daily. (Patient taking differently: Take 0.4 mg by mouth every evening. ) 30 capsule 3  . venlafaxine XR (EFFEXOR-XR) 150 MG 24 hr capsule TAKE 2 CAPSULES DAILY WITH BREAKFAST 180 capsule 3   No current facility-administered medications for this visit.    Allergies-reviewed and updated Allergies  Allergen Reactions  . Codeine Nausea And Vomiting and Other (See Comments)    DIZZINESS  . Amoxicillin-Pot Clavulanate   . Ciprofloxacin Other (See Comments)    Unknown    Social History   Socioeconomic History  . Marital status: Married    Spouse name: Not on file  . Number of children: Not on file  . Years of education: Not on file  . Highest education level: Not on file  Occupational History  . Occupation: Patent examiner  Tobacco Use  .  Smoking status: Former Smoker    Packs/day: 1.00    Years: 20.00    Pack years: 20.00    Types: Cigarettes    Quit date: 08/11/1988    Years since quitting: 31.6  . Smokeless tobacco: Never Used  Vaping Use  . Vaping Use: Never used  Substance and Sexual Activity  . Alcohol use: Yes    Comment: 05/21/2017 "1-2 beers/month"  . Drug use: No  . Sexual activity: Not Currently  Other Topics Concern  . Not on file  Social History Narrative  . Not on file   Social Determinants of Health   Financial Resource Strain:   . Difficulty of Paying Living Expenses: Not on file  Food Insecurity:   . Worried About Charity fundraiser in the Last Year: Not on file  . Ran Out of Food in the Last Year: Not on file  Transportation Needs:   . Lack of Transportation (Medical): Not on file  . Lack of Transportation (Non-Medical): Not on file  Physical Activity:   . Days of Exercise per Week: Not on file  . Minutes of Exercise per Session: Not on file  Stress:   .  Feeling of Stress : Not on file  Social Connections:   . Frequency of Communication with Friends and Family: Not on file  . Frequency of Social Gatherings with Friends and Family: Not on file  . Attends Religious Services: Not on file  . Active Member of Clubs or Organizations: Not on file  . Attends Archivist Meetings: Not on file  . Marital Status: Not on file          Objective:  Physical Exam: BP 128/87   Pulse 90   Temp 97.7 F (36.5 C) (Temporal)   Ht 5\' 9"  (1.753 m)   Wt 193 lb 9.6 oz (87.8 kg)   SpO2 95%   BMI 28.59 kg/m   Gen: No acute distress, resting comfortably CV: Regular rate and rhythm with no murmurs appreciated Pulm: Normal work of breathing, clear to auscultation bilaterally with no crackles, wheezes, or rhonchi Neuro: Grossly normal, moves all extremities Psych: Normal affect and thought content  Time Spent: 50 minutes of total time was spent on the date of the encounter performing the following actions: chart review prior to seeing the patient including recent urgent care visits, obtaining history, performing a medically necessary exam, counseling on the treatment plan, placing orders, and documenting in our EHR.        Algis Greenhouse. [name], MD 04/02/2020 2:28 PM

## 2020-04-02 NOTE — Assessment & Plan Note (Signed)
Continue levothyroxine 112 mcg daily.  Check TSH with next blood draw.

## 2020-04-02 NOTE — Assessment & Plan Note (Signed)
Stable.  Continue management per urology. 

## 2020-04-02 NOTE — Assessment & Plan Note (Signed)
Continue Abilify, clonazepam, gabapentin, and venlafaxine per psychiatry.

## 2020-04-02 NOTE — Assessment & Plan Note (Signed)
At goal off medications. 

## 2020-04-06 DIAGNOSIS — Z03818 Encounter for observation for suspected exposure to other biological agents ruled out: Secondary | ICD-10-CM | POA: Diagnosis not present

## 2020-04-17 ENCOUNTER — Telehealth: Payer: Self-pay | Admitting: Psychiatry

## 2020-04-17 NOTE — Telephone Encounter (Signed)
Pt called and left a message stating that he needs a refill on his klonopin 1 [facility]

## 2020-04-18 ENCOUNTER — Other Ambulatory Visit: Payer: Self-pay | Admitting: Psychiatry

## 2020-04-18 DIAGNOSIS — F5105 Insomnia due to other mental disorder: Secondary | ICD-10-CM

## 2020-04-18 MED ORDER — CLONAZEPAM 1 MG PO TABS: 1.5000 mg | ORAL_TABLET | Freq: Every evening | ORAL | 0 refills | Status: DC | PRN

## 2020-04-20 ENCOUNTER — Encounter: Payer: Self-pay | Admitting: Family Medicine

## 2020-04-20 ENCOUNTER — Ambulatory Visit (INDEPENDENT_AMBULATORY_CARE_PROVIDER_SITE_OTHER): Payer: Medicare Other | Admitting: Family Medicine

## 2020-04-20 ENCOUNTER — Other Ambulatory Visit: Payer: Self-pay

## 2020-04-20 ENCOUNTER — Telehealth: Payer: Self-pay | Admitting: Family Medicine

## 2020-04-20 DIAGNOSIS — J849 Interstitial pulmonary disease, unspecified: Secondary | ICD-10-CM | POA: Diagnosis not present

## 2020-04-20 DIAGNOSIS — F5105 Insomnia due to other mental disorder: Secondary | ICD-10-CM | POA: Diagnosis not present

## 2020-04-20 MED ORDER — PREDNISONE 50 MG PO TABS: ORAL_TABLET | ORAL | 0 refills | Status: DC

## 2020-04-20 MED ORDER — CLONAZEPAM 1 MG PO TABS: 1.5000 mg | ORAL_TABLET | Freq: Every evening | ORAL | 0 refills | Status: DC | PRN

## 2020-04-20 MED ORDER — ALBUTEROL SULFATE HFA 108 (90 BASE) MCG/ACT IN AERS: 2.0000 | INHALATION_SPRAY | Freq: Four times a day (QID) | RESPIRATORY_TRACT | 2 refills | Status: DC | PRN

## 2020-04-20 MED ORDER — METHYLPREDNISOLONE ACETATE 80 MG/ML IJ SUSP
80.0000 mg | Freq: Once | INTRAMUSCULAR | Status: AC
  Administered 2020-04-20: 80 mg via INTRAMUSCULAR

## 2020-04-20 NOTE — Assessment & Plan Note (Signed)
Unclear if recent bout of "pneumonia" is actually due to flare of interstitial lung disease.  He tried several antibiotics with no improvement over the last month.  Lung exam is clear today.  He has not seen pulmonology in a couple of years.  Reportedly did not have good experience at previous pulmonology visit.  We will give 80 mg Depo-Medrol today and start prednisone burst.  We will check high-resolution CT scan further evaluate interstitial lung disease and rule out other possible causes.  Depending on results of CT scan will likely need referral back to pulmonology.

## 2020-04-20 NOTE — Patient Instructions (Addendum)
It was very nice to see you today!  We will give you a steroid shot today.  Please start the prednisone.  We will check a CT scan of your lungs.  Depending on the results we may need to send you to see a lung doctor.  Take care, Dr [name]  Please try these tips to maintain a healthy lifestyle:   Eat at least 3 REAL meals and 1-2 snacks per day.  Aim for no more than 5 hours between eating.  If you eat breakfast, please do so within one hour of getting up.    Each meal should contain half fruits/vegetables, one quarter protein, and one quarter carbs (no bigger than a computer mouse)   Cut down on sweet beverages. This includes juice, soda, and sweet tea.     Drink at least 1 glass of water with each meal and aim for at least 8 glasses per day   Exercise at least 150 minutes every week.

## 2020-04-20 NOTE — Telephone Encounter (Signed)
Patient's wife is calling in wondering if there is any other place he can go to get a CT, as the next available was not until 2 weeks from now.

## 2020-04-20 NOTE — Progress Notes (Signed)
   Bryan Tyler is a 74 y.o. male who presents today for an office visit.  Assessment/Plan:  Chronic Problems Addressed Today: Interstitial lung disease ([place]) Unclear if recent bout of "pneumonia" is actually due to flare of interstitial lung disease.  He tried several antibiotics with no improvement over the last month.  Lung exam is clear today.  He has not seen pulmonology in a couple of years.  Reportedly did not have good experience at previous pulmonology visit.  We will give 80 mg Depo-Medrol today and start prednisone burst.  We will check high-resolution CT scan further evaluate interstitial lung disease and rule out other possible causes.  Depending on results of CT scan will likely need referral back to pulmonology.    Subjective:  HPI:  Patient here for follow-up for pneumonia.  We saw him a couple weeks ago.  Still has ongoing fatigue. Improvement has plataeued over the last couple of weeks.  His first visit with me was a couple of weeks ago.  Prior to this he had been seen in urgent care multiple times in with his previous PCP.  He was tried on several antibiotics with no improvement.  Still has quite a bit of fatigue.  Symptoms are worse with exertion.  Patient does have a history of interstitial lung disease.  Was referred to pulmonology 2 years ago but has not followed up with them since.  Was prescribed medication but was not able to afford it.       Objective:  Physical Exam: BP 117/88   Pulse 99   Temp (!) 97.4 F (36.3 C) (Temporal)   Ht 5\' 9"  (1.753 m)   Wt 193 lb (87.5 kg)   SpO2 97%   BMI 28.50 kg/m   Gen: No acute distress, resting comfortably CV: Regular rate and rhythm with no murmurs appreciated Pulm: Normal work of breathing, clear to auscultation bilaterally with no crackles, wheezes, or rhonchi Neuro: Grossly normal, moves all extremities Psych: Normal affect and thought content      Geeta Dworkin M. [name], MD 04/20/2020 10:32 AM

## 2020-04-20 NOTE — Telephone Encounter (Signed)
Called patient and notified that it was the earliest appointment.

## 2020-04-20 NOTE — Telephone Encounter (Signed)
2 weeks is the earliest he will be able to get in unless the referral is changed to urgent. [facility] is the only location around [place] that accepts the patients insurance

## 2020-05-02 ENCOUNTER — Ambulatory Visit
Admission: RE | Admit: 2020-05-02 | Discharge: 2020-05-02 | Disposition: A | Discharge status: Home / Self Care | Payer: Medicare Other | Source: Ambulatory Visit | Attending: Family Medicine | Admitting: Family Medicine

## 2020-05-02 ENCOUNTER — Other Ambulatory Visit: Payer: Self-pay

## 2020-05-02 DIAGNOSIS — J849 Interstitial pulmonary disease, unspecified: Secondary | ICD-10-CM

## 2020-05-02 DIAGNOSIS — I2510 Atherosclerotic heart disease of native coronary artery without angina pectoris: Secondary | ICD-10-CM | POA: Diagnosis not present

## 2020-05-02 DIAGNOSIS — J479 Bronchiectasis, uncomplicated: Secondary | ICD-10-CM | POA: Diagnosis not present

## 2020-05-02 DIAGNOSIS — I712 Thoracic aortic aneurysm, without rupture: Secondary | ICD-10-CM | POA: Diagnosis not present

## 2020-05-02 DIAGNOSIS — J84112 Idiopathic pulmonary fibrosis: Secondary | ICD-10-CM | POA: Diagnosis not present

## 2020-05-04 ENCOUNTER — Telehealth: Payer: Self-pay

## 2020-05-04 NOTE — Telephone Encounter (Signed)
Pt.'s wife would like someone to giver her a call about pt.'s CT scan

## 2020-05-05 DIAGNOSIS — Z23 Encounter for immunization: Secondary | ICD-10-CM | POA: Diagnosis not present

## 2020-05-05 NOTE — Telephone Encounter (Signed)
Left message on voicemail to call office.  

## 2020-05-07 NOTE — Telephone Encounter (Signed)
Pt is calling in for CT scan results

## 2020-05-07 NOTE — Progress Notes (Signed)
Please let patient know his CT scan showed worsened pulmonary fibrosis. He does not have pneumonia. Recommend that we refer him back to pulmonology  - we can send him outside of the [facility] if her prefers that.  Bryan Tyler. [name], MD 05/07/2020 9:37 PM

## 2020-05-07 NOTE — Telephone Encounter (Signed)
I spoke with the pt to make him aware that when Dr [name] has time to review the CT scan someone from our office will give him a call. Pt voiced understanding.

## 2020-05-08 ENCOUNTER — Other Ambulatory Visit: Payer: Self-pay

## 2020-05-08 DIAGNOSIS — J849 Interstitial pulmonary disease, unspecified: Secondary | ICD-10-CM

## 2020-05-25 ENCOUNTER — Other Ambulatory Visit: Payer: Self-pay

## 2020-05-25 ENCOUNTER — Encounter: Payer: Self-pay | Admitting: Family Medicine

## 2020-05-25 ENCOUNTER — Ambulatory Visit (INDEPENDENT_AMBULATORY_CARE_PROVIDER_SITE_OTHER): Payer: Medicare Other | Admitting: Family Medicine

## 2020-05-25 VITALS — BP 122/81 | HR 106 | Temp 97.4°F | Ht 69.0 in | Wt 190.4 lb

## 2020-05-25 DIAGNOSIS — Z0000 Encounter for general adult medical examination without abnormal findings: Secondary | ICD-10-CM

## 2020-05-25 DIAGNOSIS — F419 Anxiety disorder, unspecified: Secondary | ICD-10-CM | POA: Diagnosis not present

## 2020-05-25 DIAGNOSIS — E663 Overweight: Secondary | ICD-10-CM | POA: Diagnosis not present

## 2020-05-25 DIAGNOSIS — E785 Hyperlipidemia, unspecified: Secondary | ICD-10-CM | POA: Diagnosis not present

## 2020-05-25 DIAGNOSIS — F32A Depression, unspecified: Secondary | ICD-10-CM

## 2020-05-25 DIAGNOSIS — I10 Essential (primary) hypertension: Secondary | ICD-10-CM

## 2020-05-25 DIAGNOSIS — E018 Other iodine-deficiency related thyroid disorders and allied conditions: Secondary | ICD-10-CM | POA: Diagnosis not present

## 2020-05-25 DIAGNOSIS — J849 Interstitial pulmonary disease, unspecified: Secondary | ICD-10-CM | POA: Diagnosis not present

## 2020-05-25 DIAGNOSIS — R739 Hyperglycemia, unspecified: Secondary | ICD-10-CM | POA: Insufficient documentation

## 2020-05-25 DIAGNOSIS — C61 Malignant neoplasm of prostate: Secondary | ICD-10-CM | POA: Diagnosis not present

## 2020-05-25 DIAGNOSIS — Z6828 Body mass index (BMI) 28.0-28.9, adult: Secondary | ICD-10-CM

## 2020-05-25 NOTE — Assessment & Plan Note (Signed)
Check A1c. 

## 2020-05-25 NOTE — Assessment & Plan Note (Signed)
Continue levothyroxine 172mcg daily. Check TSH today.

## 2020-05-25 NOTE — Assessment & Plan Note (Signed)
Follows with urology

## 2020-05-25 NOTE — Progress Notes (Signed)
Chief Complaint:  Bryan Tyler is a 74 y.o. male who presents today for a subsequent Medicare Annual Wellness Visit and to discuss management of his chronic medical problems.  Assessment/Plan:  Chronic Problems Addressed Today: Anxiety and depression Sees psychiatry. On abilify, clonazepam, gabapentin, and venlafaxine.   Adenocarcinoma of prostate [facility]) Follows with urology.   HTN (hypertension) At goal off medications.   HYPOTHYROIDISM, POST-RADIOACTIVE IODINE Continue levothyroxine 130mcg daily. Check TSH today.   Dyslipidemia Stable. Continue lipitor 40mg  daily. Check lipids today.  Also check CBC, CMET, TSH  Interstitial lung disease ([place]) Not controlled. Will be following up with pulm in a few weeks.   Hyperglycemia Check A1c.    Body mass index is 28.12 kg/m. / Overweight  BMI Metric Follow Up - 05/25/20 0954      BMI Metric Follow Up-Please document annually   BMI Metric Follow Up Education provided            Preventative Healthcare Due for repeat colonoscopy in 2026. Follows with urology for prostate cancer - does not need screening. Up to date on flu and covid vaccines.   During the course of the visit the patient was educated and counseled about appropriate screening and preventive services including:        Fall prevention   Nutrition Physical Activity Weight Management Cognition    Subjective:  HPI:  Health Risk Assessment: Patient considers his overall health to be good. He has no difficulty performing the following: . Preparing food and eating . Bathing  . Getting dressed . Using the toilet . Shopping . Managing Finances . Moving around from place to place  He has not had any falls within the past year.   Depression screen [facility] [place] 2/9 02/29/2020  Decreased Interest 0  Down, Depressed, Hopeless 0  PHQ - 2 Score 0  Altered sleeping 0  Tired, decreased energy 0  Change in appetite 0  Feeling bad or failure about yourself  0    Trouble concentrating 0  Moving slowly or fidgety/restless 0  Suicidal thoughts -  PHQ-9 Score 0  Difficult doing work/chores Not difficult at all   Lifestyle Factors: Diet: Balanced. Plenty of fruits and vegetables.  Exercise: Limited due to pulmonary issues.   Patient Care Team: Vivi Barrack, MD as PCP - General (Family Medicine) Jettie Booze, MD as PCP - Cardiology (Cardiology) Sydnee Cabal, MD (Orthopedic Surgery) Comer Locket, PA-C as Physician Assistant (Physician Assistant) Alyson Ingles Candee Furbish, MD as Consulting Physician (Urology) Delight Hoh, MD as Consulting Physician (Psychiatry) Otelia Sergeant, OD as Referring Physician Cottle, Billey Co., MD as Attending Physician (Psychiatry)   He has no acute complaints today. See A/p for status of chronic conditions.   ROS: Per HPI, otherwise a complete review of systems was negative.   PMH:  The following were reviewed and entered/updated in epic: Past Medical History:  Diagnosis Date  . Adenocarcinoma of prostate ([place]) 04/16/2013  . Aneurysm, aorta, thoracic ([place])   . Anxiety   . Arthritis    "knees" (05/21/2017)  . Depression   . Diverticulosis   . Dyspnea    on exertion for a long period time  . GERD (gastroesophageal reflux disease)   . Hyperlipidemia   . Hypertension   . Hypothyroidism, postradioiodine therapy    1980's  . Nocturia   . Organic impotence   . OSA (obstructive sleep apnea)    NO CPAP--  S/P SURGERY  2002  . Osteoporosis   .  Pneumonia 1970  . PONV (postoperative nausea and vomiting)   . Urge urinary incontinence   . Wears contact lenses    Patient Active Problem List   Diagnosis Date Noted  . Hyperglycemia 05/25/2020  . Interstitial lung disease ([place]) 04/20/2020  . Thoracic aortic aneurysm without rupture ([place]) 04/02/2020  . History of total knee replacement, right 12/17/2018  . S/p reverse total shoulder arthroplasty 05/21/2017  . HTN (hypertension) 10/16/2015  .  Adenocarcinoma of prostate ([place]) 04/16/2013  . Renal cysts, acquired, bilateral 07/15/2011  . HEARING LOSS 09/23/2010  . SLEEP APNEA, OBSTRUCTIVE 03/05/2010  . ORGANIC IMPOTENCE 02/22/2009  . PARESTHESIA 02/22/2009  . Internal hemorrhoids 10/31/2008  . HYPOTHYROIDISM, POST-RADIOACTIVE IODINE 10/21/2007  . Dyslipidemia 10/21/2007  . Anxiety and depression 10/21/2007  . GERD 10/21/2007  . Osteoporosis 10/21/2007   Past Surgical History:  Procedure Laterality Date  . HEMORRHOIDECTOMY WITH HEMORRHOID BANDING  2007  . INGUINAL HERNIA REPAIR Bilateral    x4  (2 each side)  . INSERTION PROSTATE RADIATION SEED    . KNEE ARTHROSCOPY Right 2012  . NASAL SEPTUM SURGERY  1982   w/ rhinoplasty  . PROSTATE BIOPSY    . RADIOACTIVE SEED IMPLANT N/A 05/30/2013   Procedure: RADIOACTIVE SEED IMPLANT;  Surgeon: Hanley Ben, MD;  Location: [place];  Service: Urology;  Laterality: N/A;  . REVERSE SHOULDER ARTHROPLASTY Right 05/21/2017  . REVERSE SHOULDER ARTHROPLASTY Right 05/21/2017  . REVERSE SHOULDER ARTHROPLASTY Right 05/21/2017   Procedure: REVERSE RIGHT SHOULDER ARTHROPLASTY;  Surgeon: Justice Britain, MD;  Location: [place];  Service: Orthopedics;  Laterality: Right;  . RHINOPLASTY  1982   w/w/ septoplasty  . SHOULDER ARTHROSCOPY W/ ROTATOR CUFF REPAIR Right 2004  . SHOULDER ARTHROSCOPY W/ ROTATOR CUFF REPAIR Left 2016  . TONSILLECTOMY  AS CHILD  . TOTAL KNEE ARTHROPLASTY Right 09/24/2018   Procedure: TOTAL KNEE ARTHROPLASTY;  Surgeon: Sydnee Cabal, MD;  Location: [facility];  Service: Orthopedics;  Laterality: Right;  Adductor Block  . UVULOPALATOPHARYNGOPLASTY  2002    Family History  Problem Relation Age of Onset  . Cancer Mother        Pancreatic Cancer  . Heart disease Father 87       CABG    Medications- reviewed and updated Current Outpatient Medications  Medication Sig Dispense Refill  . albuterol (PROVENTIL HFA;VENTOLIN HFA) 108 (90 BASE) MCG/ACT inhaler  Inhale 2 puffs into the lungs every 6 (six) hours as needed for wheezing.    Marland Kitchen albuterol (VENTOLIN HFA) 108 (90 Base) MCG/ACT inhaler Inhale 2 puffs into the lungs every 6 (six) hours as needed for wheezing or shortness of breath. 18 g 2  . ARIPiprazole (ABILIFY) 2 MG tablet Take 1 tablet (2 mg total) by mouth daily. 90 tablet 1  . atorvastatin (LIPITOR) 40 MG tablet TAKE 1 TABLET EVERY EVENING 90 tablet 3  . clonazePAM (KLONOPIN) 1 MG tablet Take 1.5 tablets (1.5 mg total) by mouth at bedtime as needed for anxiety. 15 tablet 0  . gabapentin (NEURONTIN) 800 MG tablet Take 1 tablet (800 mg total) by mouth at bedtime. 90 tablet 1  . LEVOXYL 112 MCG tablet TAKE 1 TABLET DAILY 90 tablet 3  . Multiple Vitamin (MULTIVITAMIN WITH MINERALS) TABS tablet Take 1 tablet by mouth daily.    . Naftifine HCl 2 % CREA Apply thin layer to affected area once daily for 2-3 weeks. 45 g 0  . omeprazole (PRILOSEC) 40 MG capsule Take 1 capsule (40 mg total) by mouth  daily as needed (heartburn). 90 capsule 1  . oxymetazoline (ANEFRIN NASAL SPRAY) 0.05 % nasal spray Place 2 sprays into both nostrils 3 (three) times daily as needed for congestion.     . predniSONE (DELTASONE) 50 MG tablet Take 1 tablet daily for 5 days. 5 tablet 0  . tamsulosin (FLOMAX) 0.4 MG CAPS capsule Take 1 capsule (0.4 mg total) by mouth daily. (Patient taking differently: Take 0.4 mg by mouth every evening. ) 30 capsule 3  . venlafaxine XR (EFFEXOR-XR) 150 MG 24 hr capsule TAKE 2 CAPSULES DAILY WITH BREAKFAST 180 capsule 3  . amoxicillin (AMOXIL) 500 MG tablet Take 500 mg by mouth 3 (three) times daily.     No current facility-administered medications for this visit.    Allergies-reviewed and updated Allergies  Allergen Reactions  . Codeine Nausea And Vomiting and Other (See Comments)    DIZZINESS  . Amoxicillin-Pot Clavulanate   . Ciprofloxacin Other (See Comments)    Unknown    Social History   Socioeconomic History  . Marital status:  Married    Spouse name: Not on file  . Number of children: Not on file  . Years of education: Not on file  . Highest education level: Not on file  Occupational History  . Occupation: Patent examiner  Tobacco Use  . Smoking status: Former Smoker    Packs/day: 1.00    Years: 20.00    Pack years: 20.00    Types: Cigarettes    Quit date: 08/11/1988    Years since quitting: 31.8  . Smokeless tobacco: Never Used  Vaping Use  . Vaping Use: Never used  Substance and Sexual Activity  . Alcohol use: Yes    Comment: 05/21/2017 "1-2 beers/month"  . Drug use: No  . Sexual activity: Not Currently  Other Topics Concern  . Not on file  Social History Narrative  . Not on file   Social Determinants of Health   Financial Resource Strain:   . Difficulty of Paying Living Expenses: Not on file  Food Insecurity:   . Worried About Charity fundraiser in the Last Year: Not on file  . Ran Out of Food in the Last Year: Not on file  Transportation Needs:   . Lack of Transportation (Medical): Not on file  . Lack of Transportation (Non-Medical): Not on file  Physical Activity:   . Days of Exercise per Week: Not on file  . Minutes of Exercise per Session: Not on file  Stress:   . Feeling of Stress : Not on file  Social Connections:   . Frequency of Communication with Friends and Family: Not on file  . Frequency of Social Gatherings with Friends and Family: Not on file  . Attends Religious Services: Not on file  . Active Member of Clubs or Organizations: Not on file  . Attends Archivist Meetings: Not on file  . Marital Status: Not on file         Objective/Observations  Physical Exam: BP 122/81   Pulse (!) 106   Temp (!) 97.4 F (36.3 C) (Temporal)   Ht 5\' 9"  (1.753 m)   Wt 190 lb 6.4 oz (86.4 kg)   SpO2 96%   BMI 28.12 kg/m  Gen: NAD, resting comfortably HEENT: TMs normal bilaterally. OP clear. No thyromegaly noted.  CV: RRR with no murmurs appreciated Pulm: NWOB,  CTAB with no crackles, wheezes, or rhonchi GI: Normal bowel sounds present. Soft, Nontender, Nondistended. MSK: no edema, cyanosis,  or clubbing noted Skin: warm, dry Neuro: CN2-12 grossly intact. Strength 5/5 in upper and lower extremities. Reflexes symmetric and intact bilaterally. Normal minicog with 3/3 delayed word recall. Psych: Normal affect and thought content      Bryan Mccurdy M. [name], MD 05/25/2020 9:55 AM

## 2020-05-25 NOTE — Patient Instructions (Signed)
It was very nice to see you today!  We will check blood work today.  No medication changes.  Please check with pharmacy about the new shingles vaccine.   I will see you back in a year for your next annual checkup.  Please come back to see me sooner if needed.   Take care, Dr [name]  Please try these tips to maintain a healthy lifestyle:   Eat at least 3 REAL meals and 1-2 snacks per day.  Aim for no more than 5 hours between eating.  If you eat breakfast, please do so within one hour of getting up.    Each meal should contain half fruits/vegetables, one quarter protein, and one quarter carbs (no bigger than a computer mouse)   Cut down on sweet beverages. This includes juice, soda, and sweet tea.     Drink at least 1 glass of water with each meal and aim for at least 8 glasses per day   Exercise at least 150 minutes every week.    Preventive Care 75 Years and Older, Male Preventive care refers to lifestyle choices and visits with your health care provider that can promote health and wellness. This includes:  A yearly physical exam. This is also called an annual well check.  Regular dental and eye exams.  Immunizations.  Screening for certain conditions.  Healthy lifestyle choices, such as diet and exercise. What can I expect for my preventive care visit? Physical exam Your health care provider will check:  Height and weight. These may be used to calculate body mass index (BMI), which is a measurement that tells if you are at a healthy weight.  Heart rate and blood pressure.  Your skin for abnormal spots. Counseling Your health care provider may ask you questions about:  Alcohol, tobacco, and drug use.  Emotional well-being.  Home and relationship well-being.  Sexual activity.  Eating habits.  History of falls.  Memory and ability to understand (cognition).  Work and work Statistician. What immunizations do I need?  Influenza (flu) vaccine  This  is recommended every year. Tetanus, diphtheria, and pertussis (Tdap) vaccine  You may need a Td booster every 10 years. Varicella (chickenpox) vaccine  You may need this vaccine if you have not already been vaccinated. Zoster (shingles) vaccine  You may need this after age 77. Pneumococcal conjugate (PCV13) vaccine  One dose is recommended after age 1. Pneumococcal polysaccharide (PPSV23) vaccine  One dose is recommended after age 52. Measles, mumps, and rubella (MMR) vaccine  You may need at least one dose of MMR if you were born in 1957 or later. You may also need a second dose. Meningococcal conjugate (MenACWY) vaccine  You may need this if you have certain conditions. Hepatitis A vaccine  You may need this if you have certain conditions or if you travel or work in places where you may be exposed to hepatitis A. Hepatitis B vaccine  You may need this if you have certain conditions or if you travel or work in places where you may be exposed to hepatitis B. Haemophilus influenzae type b (Hib) vaccine  You may need this if you have certain conditions. You may receive vaccines as individual doses or as more than one vaccine together in one shot (combination vaccines). Talk with your health care provider about the risks and benefits of combination vaccines. What tests do I need? Blood tests  Lipid and cholesterol levels. These may be checked every 5 years, or more frequently  depending on your overall health.  Hepatitis C test.  Hepatitis B test. Screening  Lung cancer screening. You may have this screening every year starting at age 20 if you have a 30-pack-year history of smoking and currently smoke or have quit within the past 15 years.  Colorectal cancer screening. All adults should have this screening starting at age 48 and continuing until age 66. Your health care provider may recommend screening at age 85 if you are at increased risk. You will have tests every 1-10  years, depending on your results and the type of screening test.  Prostate cancer screening. Recommendations will vary depending on your family history and other risks.  Diabetes screening. This is done by checking your blood sugar (glucose) after you have not eaten for a while (fasting). You may have this done every 1-3 years.  Abdominal aortic aneurysm (AAA) screening. You may need this if you are a current or former smoker.  Sexually transmitted disease (STD) testing. Follow these instructions at home: Eating and drinking  Eat a diet that includes fresh fruits and vegetables, whole grains, lean protein, and low-fat dairy products. Limit your intake of foods with high amounts of sugar, saturated fats, and salt.  Take vitamin and mineral supplements as recommended by your health care provider.  Do not drink alcohol if your health care provider tells you not to drink.  If you drink alcohol: ? Limit how much you have to 0-2 drinks a day. ? Be aware of how much alcohol is in your drink. In the [place] one drink equals one 12 oz bottle of beer (355 mL), one 5 oz glass of wine (148 mL), or one 1 oz glass of hard liquor (44 mL). Lifestyle  Take daily care of your teeth and gums.  Stay active. Exercise for at least 30 minutes on 5 or more days each week.  Do not use any products that contain nicotine or tobacco, such as cigarettes, e-cigarettes, and chewing tobacco. If you need help quitting, ask your health care provider.  If you are sexually active, practice safe sex. Use a condom or other form of protection to prevent STIs (sexually transmitted infections).  Talk with your health care provider about taking a low-dose aspirin or statin. What's next?  Visit your health care provider once a year for a well check visit.  Ask your health care provider how often you should have your eyes and teeth checked.  Stay up to date on all vaccines. This information is not intended to replace  advice given to you by your health care provider. Make sure you discuss any questions you have with your health care provider. Document Revised: 07/22/2018 Document Reviewed: 07/22/2018 Elsevier Patient Education  2020 [employer].

## 2020-05-25 NOTE — Assessment & Plan Note (Addendum)
Stable. Continue lipitor 40mg  daily. Check lipids today.  Also check CBC, CMET, TSH

## 2020-05-25 NOTE — Assessment & Plan Note (Signed)
Sees psychiatry. On abilify, clonazepam, gabapentin, and venlafaxine.

## 2020-05-25 NOTE — Assessment & Plan Note (Signed)
At goal off medications. 

## 2020-05-25 NOTE — Assessment & Plan Note (Signed)
Not controlled. Will be following up with pulm in a few weeks.

## 2020-05-26 LAB — COMPREHENSIVE METABOLIC PANEL
AG Ratio: 1.7 (calc) (ref 1.0–2.5)
ALT: 16 U/L (ref 9–46)
AST: 20 U/L (ref 10–35)
Albumin: 4.5 g/dL (ref 3.6–5.1)
Alkaline phosphatase (APISO): 82 U/L (ref 35–144)
BUN: 14 mg/dL (ref 7–25)
CO2: 28 mmol/L (ref 20–32)
Calcium: 9.8 mg/dL (ref 8.6–10.3)
Chloride: 101 mmol/L (ref 98–110)
Creat: 0.86 mg/dL (ref 0.70–1.18)
Globulin: 2.7 g/dL (calc) (ref 1.9–3.7)
Glucose, Bld: 95 mg/dL (ref 65–99)
Potassium: 4.6 mmol/L (ref 3.5–5.3)
Sodium: 140 mmol/L (ref 135–146)
Total Bilirubin: 0.6 mg/dL (ref 0.2–1.2)
Total Protein: 7.2 g/dL (ref 6.1–8.1)

## 2020-05-26 LAB — CBC
HCT: 44.4 % (ref 38.5–50.0)
Hemoglobin: 15 g/dL (ref 13.2–17.1)
MCH: 30.9 pg (ref 27.0–33.0)
MCHC: 33.8 g/dL (ref 32.0–36.0)
MCV: 91.5 fL (ref 80.0–100.0)
MPV: 9.9 fL (ref 7.5–12.5)
Platelets: 309 10*3/uL (ref 140–400)
RBC: 4.85 10*6/uL (ref 4.20–5.80)
RDW: 12.9 % (ref 11.0–15.0)
WBC: 9.5 10*3/uL (ref 3.8–10.8)

## 2020-05-26 LAB — LIPID PANEL
Cholesterol: 163 mg/dL (ref <200)
HDL: 63 mg/dL (ref >=40)
LDL Cholesterol (Calc): 70 mg/dL (calc)
Non-HDL Cholesterol (Calc): 100 mg/dL (calc) (ref <130)
Total CHOL/HDL Ratio: 2.6 (calc) (ref <5.0)
Triglycerides: 207 mg/dL — ABNORMAL HIGH (ref <150)

## 2020-05-26 LAB — TSH: TSH: 2.82 mIU/L (ref 0.40–4.50)

## 2020-05-26 LAB — HEMOGLOBIN A1C
Hgb A1c MFr Bld: 6 % of total Hgb — ABNORMAL HIGH (ref <5.7)
Mean Plasma Glucose: 126 (calc)
eAG (mmol/L): 7 (calc)

## 2020-05-28 DIAGNOSIS — C61 Malignant neoplasm of prostate: Secondary | ICD-10-CM | POA: Diagnosis not present

## 2020-05-28 NOTE — Progress Notes (Signed)
Please inform patient of the following:  Blood sugar is borderline elevated but everything else is STABLE. Do not need to make any changes to his treatment plan at this time. Would like for him to keep working on diet and exercise and we can recheck in a year.

## 2020-05-29 ENCOUNTER — Encounter: Payer: Medicare Other | Admitting: Family Medicine

## 2020-05-31 ENCOUNTER — Encounter: Payer: Self-pay | Admitting: Psychiatry

## 2020-06-05 ENCOUNTER — Other Ambulatory Visit: Payer: Self-pay

## 2020-06-05 ENCOUNTER — Encounter: Payer: Self-pay | Admitting: Psychiatry

## 2020-06-05 ENCOUNTER — Institutional Professional Consult (permissible substitution): Payer: Medicare Other | Admitting: Pulmonary Disease

## 2020-06-05 ENCOUNTER — Ambulatory Visit (INDEPENDENT_AMBULATORY_CARE_PROVIDER_SITE_OTHER): Payer: Medicare Other | Admitting: Psychiatry

## 2020-06-05 DIAGNOSIS — F411 Generalized anxiety disorder: Secondary | ICD-10-CM

## 2020-06-05 DIAGNOSIS — R251 Tremor, unspecified: Secondary | ICD-10-CM

## 2020-06-05 DIAGNOSIS — F5105 Insomnia due to other mental disorder: Secondary | ICD-10-CM

## 2020-06-05 DIAGNOSIS — C61 Malignant neoplasm of prostate: Secondary | ICD-10-CM | POA: Diagnosis not present

## 2020-06-05 DIAGNOSIS — F3342 Major depressive disorder, recurrent, in full remission: Secondary | ICD-10-CM

## 2020-06-05 DIAGNOSIS — R351 Nocturia: Secondary | ICD-10-CM | POA: Diagnosis not present

## 2020-06-05 DIAGNOSIS — N5201 Erectile dysfunction due to arterial insufficiency: Secondary | ICD-10-CM | POA: Diagnosis not present

## 2020-06-05 DIAGNOSIS — N3943 Post-void dribbling: Secondary | ICD-10-CM | POA: Diagnosis not present

## 2020-06-05 MED ORDER — ARIPIPRAZOLE 2 MG PO TABS: 2.0000 mg | ORAL_TABLET | Freq: Every day | ORAL | 1 refills | Status: DC

## 2020-06-05 NOTE — Progress Notes (Signed)
STCLAIR SZYMBORSKI [id number] September 20, 1945 74 y.o.  Subjective:   Patient ID:  Bryan Tyler is a 74 y.o. (DOB [birth year]) male.  Chief Complaint:  Chief Complaint  Patient presents with  . Follow-up  . Depression  . Sleeping Problem    HPI GERELL FORTSON presents to the office today for follow-up of history of major depression and anxiety and insomnia.  seen June 2020.  The only medication change was to reduce Abilify from 5 mg to 2 mg daily to see if tremor would improve.  He had residual anxiety but not much depression at the time.  Patient called April 22, 2019 asking for refill of clonazepam early indicating he had increased the dosages on his own to 1.5 mg instead of 1 mg.  He was informed he could not change the dosage of a controlled substance without our permission.  seen December 2020.  The following was noted: Tremor resolved with reduction in Abilify.  Wants to go through meds and their purpose. No history of RLS.   Ran out of Klonopin lately and only sleeping 3-4  Hours.  Usually sleeps oK with 1 mg nightly. Overall anxiety and depression and sleep are managed.  Because of polypharmacy we elected to try to discontinue buspirone and he was to let us know if he had any rebound anxiety.  December 07, 2019 appointment the following is noted: More trouble with sleep and Klonopin 1 mg no longer getting him to sleep and will take another 1/2 tablet.  Then runs out of the Klonopin early. No SE from 1.5 mg Klonopin. Sleep 7 hours nightly.  45 nap in day.   2 cups coffee AM, not daily other caffeine but sometimes tea with dinner.   Work 3 days week. Plan: No med changes  06/05/2020 appointment with the following noted: Rough physically, pneumonia July and then dx pulm fibrosis.  Feels not well.  No Covid.  Not smoker. Stayed on Effexor XR 300, Abilify 2 mg  And clonazepam 1.0-1.5 mg HS.  Anxiety not too bad.  Depression less well controlled with health stress.  Anxiety  symptoms include: Excessive Worry,., worse at night lying down when not distracted.   Pt reports couldn't go to sleep before clonazepam and when ran out. Has run out of it at times and can't sleep.  Pt reports that appetite is good. Pt reports that energy is less good.  Concentration is good. Suicidal thoughts:  denied by patient.  Past Psychiatric Medication Trials: Abilify 5 started 2017, Effexor 300, Buspirone 15 BID, Clonazepam 1 &1/2 mg nightly Gabapentin 800 nightly Belsomra Lorazepam Mirtazapine 30 Trazodone 150  Review of Systems:  Review of Systems  Respiratory: Positive for shortness of breath.   Cardiovascular:       Enlarged aorta with follow-up next month.  Neurological: Negative for dizziness, tremors and weakness.  Neuro said no Parkinson's dz.  Medications: I have reviewed the patient's current medications.  Current Outpatient Medications  Medication Sig Dispense Refill  . albuterol (PROVENTIL HFA;VENTOLIN HFA) 108 (90 BASE) MCG/ACT inhaler Inhale 2 puffs into the lungs every 6 (six) hours as needed for wheezing.    Marland Kitchen albuterol (VENTOLIN HFA) 108 (90 Base) MCG/ACT inhaler Inhale 2 puffs into the lungs every 6 (six) hours as needed for wheezing or shortness of breath. 18 g 2  . amoxicillin (AMOXIL) 500 MG tablet Take 500 mg by mouth 3 (three) times daily.    . ARIPiprazole (ABILIFY) 2 MG tablet Take 1 tablet (  2 mg total) by mouth daily. 90 tablet 1  . atorvastatin (LIPITOR) 40 MG tablet TAKE 1 TABLET EVERY EVENING 90 tablet 3  . clonazePAM (KLONOPIN) 1 MG tablet Take 1.5 tablets (1.5 mg total) by mouth at bedtime as needed for anxiety. 15 tablet 0  . gabapentin (NEURONTIN) 800 MG tablet Take 1 tablet (800 mg total) by mouth at bedtime. 90 tablet 1  . LEVOXYL 112 MCG tablet TAKE 1 TABLET DAILY 90 tablet 3  . Multiple Vitamin (MULTIVITAMIN WITH MINERALS) TABS tablet Take 1 tablet by mouth daily.    . Naftifine HCl 2 % CREA Apply thin layer to affected area once daily for  2-3 weeks. 45 g 0  . omeprazole (PRILOSEC) 40 MG capsule Take 1 capsule (40 mg total) by mouth daily as needed (heartburn). 90 capsule 1  . oxymetazoline (ANEFRIN NASAL SPRAY) 0.05 % nasal spray Place 2 sprays into both nostrils 3 (three) times daily as needed for congestion.     . predniSONE (DELTASONE) 50 MG tablet Take 1 tablet daily for 5 days. 5 tablet 0  . tamsulosin (FLOMAX) 0.4 MG CAPS capsule Take 1 capsule (0.4 mg total) by mouth daily. (Patient taking differently: Take 0.4 mg by mouth every evening. ) 30 capsule 3  . venlafaxine XR (EFFEXOR-XR) 150 MG 24 hr capsule TAKE 2 CAPSULES DAILY WITH BREAKFAST 180 capsule 3   No current facility-administered medications for this visit.    Medication Side Effects: Other: brief dizzy when stands up  Allergies:  Allergies  Allergen Reactions  . Codeine Nausea And Vomiting and Other (See Comments)    DIZZINESS  . Amoxicillin-Pot Clavulanate   . Ciprofloxacin Other (See Comments)    Unknown    Past Medical History:  Diagnosis Date  . Adenocarcinoma of prostate ([place]) 04/16/2013  . Aneurysm, aorta, thoracic ([place])   . Anxiety   . Arthritis    "knees" (05/21/2017)  . Depression   . Diverticulosis   . Dyspnea    on exertion for a long period time  . GERD (gastroesophageal reflux disease)   . Hyperlipidemia   . Hypertension   . Hypothyroidism, postradioiodine therapy    1980's  . Nocturia   . Organic impotence   . OSA (obstructive sleep apnea)    NO CPAP--  S/P SURGERY  2002  . Osteoporosis   . Pneumonia 1970  . PONV (postoperative nausea and vomiting)   . Urge urinary incontinence   . Wears contact lenses     Family History  Problem Relation Age of Onset  . Cancer Mother        Pancreatic Cancer  . Heart disease Father 32       CABG    Social History   Socioeconomic History  . Marital status: Married    Spouse name: Not on file  . Number of children: Not on file  . Years of education: Not on file  . Highest  education level: Not on file  Occupational History  . Occupation: Patent examiner  Tobacco Use  . Smoking status: Former Smoker    Packs/day: 1.00    Years: 20.00    Pack years: 20.00    Types: Cigarettes    Quit date: 08/11/1988    Years since quitting: 31.8  . Smokeless tobacco: Never Used  Vaping Use  . Vaping Use: Never used  Substance and Sexual Activity  . Alcohol use: Yes    Comment: 05/21/2017 "1-2 beers/month"  . Drug use: No  .  Sexual activity: Not Currently  Other Topics Concern  . Not on file  Social History Narrative  . Not on file   Social Determinants of Health   Financial Resource Strain:   . Difficulty of Paying Living Expenses: Not on file  Food Insecurity:   . Worried About Charity fundraiser in the Last Year: Not on file  . Ran Out of Food in the Last Year: Not on file  Transportation Needs:   . Lack of Transportation (Medical): Not on file  . Lack of Transportation (Non-Medical): Not on file  Physical Activity:   . Days of Exercise per Week: Not on file  . Minutes of Exercise per Session: Not on file  Stress:   . Feeling of Stress : Not on file  Social Connections:   . Frequency of Communication with Friends and Family: Not on file  . Frequency of Social Gatherings with Friends and Family: Not on file  . Attends Religious Services: Not on file  . Active Member of Clubs or Organizations: Not on file  . Attends Archivist Meetings: Not on file  . Marital Status: Not on file  Intimate Partner Violence:   . Fear of Current or Ex-Partner: Not on file  . Emotionally Abused: Not on file  . Physically Abused: Not on file  . Sexually Abused: Not on file    Past Medical History, Surgical history, Social history, and Family history were reviewed and updated as appropriate.   Please see review of systems for further details on the patient's review from today.   Objective:   Physical Exam:  There were no vitals taken for this  visit.  Physical Exam Constitutional:      General: He is not in acute distress.    Appearance: He is well-developed.  Musculoskeletal:        General: No deformity.  Neurological:     Mental Status: He is alert and oriented to person, place, and time.     Motor: Tremor present.     Coordination: Coordination normal.     Comments: Tremor minimal  Psychiatric:        Attention and Perception: Attention and perception normal. He does not perceive auditory or visual hallucinations.        Mood and Affect: Mood is anxious and depressed. Affect is not labile, blunt, angry or inappropriate.        Speech: Speech normal.        Behavior: Behavior normal.        Thought Content: Thought content normal. Thought content does not include homicidal or suicidal ideation. Thought content does not include homicidal or suicidal plan.        Cognition and Memory: Cognition and memory normal.        Judgment: Judgment normal.     Comments: Insight intact. No delusions.      Lab Review:     Component Value Date/Time   NA 140 05/25/2020 0959   NA 137 11/16/2019 1001   K 4.6 05/25/2020 0959   CL 101 05/25/2020 0959   CO2 28 05/25/2020 0959   GLUCOSE 95 05/25/2020 0959   BUN 14 05/25/2020 0959   BUN 12 11/16/2019 1001   CREATININE 0.86 05/25/2020 0959   CALCIUM 9.8 05/25/2020 0959   CALCIUM 9.8 07/09/2011 0819   PROT 7.2 05/25/2020 0959   ALBUMIN 4.6 05/03/2019 1054   AST 20 05/25/2020 0959   ALT 16 05/25/2020 0959   ALKPHOS 61 05/03/2019  1054   BILITOT 0.6 05/25/2020 0959   GFRNONAA 85 11/16/2019 1001   GFRAA 98 11/16/2019 1001       Component Value Date/Time   WBC 9.5 05/25/2020 0959   RBC 4.85 05/25/2020 0959   HGB 15.0 05/25/2020 0959   HCT 44.4 05/25/2020 0959   PLT 309 05/25/2020 0959   MCV 91.5 05/25/2020 0959   MCH 30.9 05/25/2020 0959   MCHC 33.8 05/25/2020 0959   RDW 12.9 05/25/2020 0959   LYMPHSABS 2.1 01/02/2020 1608   MONOABS 0.9 01/02/2020 1608   EOSABS 0.3  01/02/2020 1608   BASOSABS 0.0 01/02/2020 1608    No results found for: POCLITH, LITHIUM   No results found for: PHENYTOIN, PHENOBARB, VALPROATE, CBMZ   .res Assessment: Plan:    Garyson was seen today for follow-up, depression and sleeping problem.  Diagnoses and all orders for this visit:  Recurrent major depressive disorder, in full remission ([place]) -     ARIPiprazole (ABILIFY) 2 MG tablet; Take 1 tablet (2 mg total) by mouth daily.  Generalized anxiety disorder  Insomnia due to mental condition  Tremor of unknown origin    Greater than 50% of 30 min face to face time with patient was spent on counseling and coordination of care. We discussed that Lauretta Demetreus has a long history of depression and anxiety the has been fairly well controlled in recent years.  He is on the maximum dosage of venlafaxine and when he had a recurrence of depression in 2017, aripiprazole 2 mg was added.  Symptoms of depression have gotten worse since he had a diagnosis of pulmonary fibrosis recently made.  He has not seen the pulmonologist yet that occurs on Monday.  He wonders about med change to try to help with the recurrent depression.  His depression had been in remission on Abilify 5 mg and then 2 mg daily.  The most logical alternative would be switching from Abilify to Rexulti 1 mg for 1 week and then if no response increase to 2 mg.  He had tremors on 5 mg of Abilify so going up and Abilify is not a good option.  Extensive discussion of sleep hygiene.  No caffeine after 3 pm.    Defer weaning gabapentin if insomnia is controlled at next visit to reduce polypharmacy.  He asked about the purpose of each of the medications that he is taking including those from other medical providers.  These were discussed as well as the side effects.  It is likely that the dizziness which is orthostatic to the dizziness that he is having is coming from 1 of the blood pressure meds.  Suggest he discuss this with his primary  care doctor.  We discussed the short-term risks associated with benzodiazepines including sedation and increased fall risk among others.  Discussed long-term side effect risk including dependence, potential withdrawal symptoms, and the potential eventual dose-related risk of dementia.  Also disc risk affecting balance and memory.  No higher dose than 1.5 mg but that dose will be allowed since failed other sleepers and tolerating it. He does not feel like he can do with any less clonazepam.  Cautioned again against exceeding dosage of clonazepam without my OK.   Supportive therapy in dealing with his new diagnosis of pulmonary fibrosis.  Encouraged him to take it 1 step at a time and see what pulmonology can offer.  This appointment was 30 minutes  Follow-up 6 to 8 weeks  Lynder Parents, MD, DFAPA  Future Appointments  Date Time Provider [place]  06/11/2020  9:30 AM Marshell Garfinkel, MD [facility] None    No orders of the defined types were placed in this encounter.   -------------------------------

## 2020-06-05 NOTE — Patient Instructions (Signed)
Stop aripiprazole  Start Rexulti 1 mg daily for 1 week then if no benefit increase to 2 mg daily.

## 2020-06-11 ENCOUNTER — Telehealth: Payer: Self-pay | Admitting: Internal Medicine

## 2020-06-11 ENCOUNTER — Ambulatory Visit (INDEPENDENT_AMBULATORY_CARE_PROVIDER_SITE_OTHER): Payer: Medicare Other | Admitting: Pulmonary Disease

## 2020-06-11 ENCOUNTER — Encounter: Payer: Self-pay | Admitting: Pulmonary Disease

## 2020-06-11 ENCOUNTER — Other Ambulatory Visit: Payer: Self-pay

## 2020-06-11 VITALS — BP 110/80 | HR 109 | Temp 97.6°F | Ht 69.0 in | Wt 188.0 lb

## 2020-06-11 DIAGNOSIS — J849 Interstitial pulmonary disease, unspecified: Secondary | ICD-10-CM | POA: Diagnosis not present

## 2020-06-11 MED ORDER — BENZONATATE 200 MG PO CAPS: 200.0000 mg | ORAL_CAPSULE | Freq: Two times a day (BID) | ORAL | 1 refills | Status: DC | PRN

## 2020-06-11 MED ORDER — OMEPRAZOLE 40 MG PO CPDR: 40.0000 mg | DELAYED_RELEASE_CAPSULE | Freq: Every day | ORAL | 0 refills | Status: DC

## 2020-06-11 MED ORDER — OMEPRAZOLE 40 MG PO CPDR: 40.0000 mg | DELAYED_RELEASE_CAPSULE | Freq: Every day | ORAL | 1 refills | Status: DC

## 2020-06-11 NOTE — Patient Instructions (Signed)
We will start an application for Esbriet which is a medication to reduce the progression of scarring in the lung and pulmonary fibrosis We will schedule you for pulmonary function test  Start Prilosec 40 mg a day Tessalon 200 mg twice daily as needed for cough We will call in a 14-day supply to your local [facility] and regular prescription to [facility]  Referral to pulmonary rehab Make sure you get rid of the feather pillows and comforters  Follow-up in 1 to 2 months.

## 2020-06-11 NOTE — Progress Notes (Signed)
Bryan Tyler    [id number]    January 10, 1946  Primary Care Physician:Parker, Algis Greenhouse, MD  Referring Physician: Vivi Barrack, [address] [name],  [place] [address]  Chief complaint: Consult for pulmonary fibrosis  HPI: 74 year old with history of pulmonary fibrosis, dyspnea, GERD, hypertension, hyperlipidemia Previously followed by Dr. Chase Caller.  Diagnosed with pulmonary fibrosis in 2019.  He was initially prescribed Ofev but denied by insurance Esbriet was then started.  He took this for 1 month and then stopped  the medication He was feeling well until December of this year when he had worsening.  Treated for pneumonia with multiple rounds of antibiotics and steroids with no improvement He is here for follow-up and reassessment.  Has significant dyspnea on exertion, cough with no mucus  Pets: Has a dog.  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  He has feather pillows which he has used for many years Smoking history: 20-pack-year smoker.  Quit in 1980 Travel history: Previously lived in [place].  No significant recent travel history Relevant family history: No family history of lung disease  Outpatient Encounter Medications as of 06/11/2020  Medication Sig  . albuterol (PROVENTIL HFA;VENTOLIN HFA) 108 (90 BASE) MCG/ACT inhaler Inhale 2 puffs into the lungs every 6 (six) hours as needed for wheezing.  Marland Kitchen albuterol (VENTOLIN HFA) 108 (90 Base) MCG/ACT inhaler Inhale 2 puffs into the lungs every 6 (six) hours as needed for wheezing or shortness of breath.  . ARIPiprazole (ABILIFY) 2 MG tablet Take 1 tablet (2 mg total) by mouth daily.  Marland Kitchen atorvastatin (LIPITOR) 40 MG tablet TAKE 1 TABLET EVERY EVENING  . clonazePAM (KLONOPIN) 1 MG tablet Take 1.5 tablets (1.5 mg total) by mouth at bedtime as needed for anxiety.  . gabapentin (NEURONTIN) 800 MG tablet Take 1 tablet (800 mg total) by mouth at  bedtime.  Marland Kitchen LEVOXYL 112 MCG tablet TAKE 1 TABLET DAILY  . Multiple Vitamin (MULTIVITAMIN WITH MINERALS) TABS tablet Take 1 tablet by mouth daily.  . Naftifine HCl 2 % CREA Apply thin layer to affected area once daily for 2-3 weeks.  Marland Kitchen omeprazole (PRILOSEC) 40 MG capsule Take 1 capsule (40 mg total) by mouth daily as needed (heartburn).  Marland Kitchen oxymetazoline (ANEFRIN NASAL SPRAY) 0.05 % nasal spray Place 2 sprays into both nostrils 3 (three) times daily as needed for congestion.   . tamsulosin (FLOMAX) 0.4 MG CAPS capsule Take 1 capsule (0.4 mg total) by mouth daily. (Patient taking differently: Take 0.4 mg by mouth every evening. )  . venlafaxine XR (EFFEXOR-XR) 150 MG 24 hr capsule TAKE 2 CAPSULES DAILY WITH BREAKFAST  . benzonatate (TESSALON) 200 MG capsule Take 1 capsule (200 mg total) by mouth 2 (two) times daily as needed for cough.  . benzonatate (TESSALON) 200 MG capsule Take 1 capsule (200 mg total) by mouth 2 (two) times daily as needed for cough.  Marland Kitchen omeprazole (PRILOSEC) 40 MG capsule Take 1 capsule (40 mg total) by mouth daily.  Marland Kitchen omeprazole (PRILOSEC) 40 MG capsule Take 1 capsule (40 mg total) by mouth daily.  . [DISCONTINUED] amoxicillin (AMOXIL) 500 MG tablet Take 500 mg by mouth 3 (three) times daily. (Patient not taking: Reported on 06/11/2020)  . [DISCONTINUED] predniSONE (DELTASONE) 50 MG tablet Take 1 tablet daily for 5 days. (Patient not taking: Reported on 06/11/2020)   No facility-administered encounter medications on file as of 06/11/2020.  Allergies as of 06/11/2020 - Review Complete 06/11/2020  Allergen Reaction Noted  . Codeine Nausea And Vomiting and Other (See Comments)   . Amoxicillin-pot clavulanate  04/02/2020  . Ciprofloxacin Other (See Comments) 02/22/2018    Past Medical History:  Diagnosis Date  . Adenocarcinoma of prostate ([place]) 04/16/2013  . Aneurysm, aorta, thoracic ([place])   . Anxiety   . Arthritis    "knees" (05/21/2017)  . Depression   . Diverticulosis    . Dyspnea    on exertion for a long period time  . GERD (gastroesophageal reflux disease)   . Hyperlipidemia   . Hypertension   . Hypothyroidism, postradioiodine therapy    1980's  . Nocturia   . Organic impotence   . OSA (obstructive sleep apnea)    NO CPAP--  S/P SURGERY  2002  . Osteoporosis   . Pneumonia 1970  . PONV (postoperative nausea and vomiting)   . Urge urinary incontinence   . Wears contact lenses     Past Surgical History:  Procedure Laterality Date  . HEMORRHOIDECTOMY WITH HEMORRHOID BANDING  2007  . INGUINAL HERNIA REPAIR Bilateral    x4  (2 each side)  . INSERTION PROSTATE RADIATION SEED    . KNEE ARTHROSCOPY Right 2012  . NASAL SEPTUM SURGERY  1982   w/ rhinoplasty  . PROSTATE BIOPSY    . RADIOACTIVE SEED IMPLANT N/A 05/30/2013   Procedure: RADIOACTIVE SEED IMPLANT;  Surgeon: Hanley Ben, MD;  Location: [facility];  Service: Urology;  Laterality: N/A;  . REVERSE SHOULDER ARTHROPLASTY Right 05/21/2017  . REVERSE SHOULDER ARTHROPLASTY Right 05/21/2017  . REVERSE SHOULDER ARTHROPLASTY Right 05/21/2017   Procedure: REVERSE RIGHT SHOULDER ARTHROPLASTY;  Surgeon: Justice Britain, MD;  Location: [facility];  Service: Orthopedics;  Laterality: Right;  . RHINOPLASTY  1982   w/w/ septoplasty  . SHOULDER ARTHROSCOPY W/ ROTATOR CUFF REPAIR Right 2004  . SHOULDER ARTHROSCOPY W/ ROTATOR CUFF REPAIR Left 2016  . TONSILLECTOMY  AS CHILD  . TOTAL KNEE ARTHROPLASTY Right 09/24/2018   Procedure: TOTAL KNEE ARTHROPLASTY;  Surgeon: Sydnee Cabal, MD;  Location: [facility];  Service: Orthopedics;  Laterality: Right;  Adductor Block  . UVULOPALATOPHARYNGOPLASTY  2002    Family History  Problem Relation Age of Onset  . Cancer Mother        Pancreatic Cancer  . Heart disease Father 78       CABG    Social History   Socioeconomic History  . Marital status: Married    Spouse name: Not on file  . Number of children: Not on file  . Years of education: Not on  file  . Highest education level: Not on file  Occupational History  . Occupation: Patent examiner  Tobacco Use  . Smoking status: Former Smoker    Packs/day: 1.00    Years: 20.00    Pack years: 20.00    Types: Cigarettes    Quit date: 08/11/1978    Years since quitting: 41.8  . Smokeless tobacco: Never Used  Vaping Use  . Vaping Use: Never used  Substance and Sexual Activity  . Alcohol use: Yes    Comment: 05/21/2017 "1-2 beers/month"  . Drug use: No  . Sexual activity: Not Currently  Other Topics Concern  . Not on file  Social History Narrative  . Not on file   Social Determinants of Health   Financial Resource Strain:   . Difficulty of Paying Living Expenses: Not on file  Food Insecurity:   .  Worried About Charity fundraiser in the Last Year: Not on file  . Ran Out of Food in the Last Year: Not on file  Transportation Needs:   . Lack of Transportation (Medical): Not on file  . Lack of Transportation (Non-Medical): Not on file  Physical Activity:   . Days of Exercise per Week: Not on file  . Minutes of Exercise per Session: Not on file  Stress:   . Feeling of Stress : Not on file  Social Connections:   . Frequency of Communication with Friends and Family: Not on file  . Frequency of Social Gatherings with Friends and Family: Not on file  . Attends Religious Services: Not on file  . Active Member of Clubs or Organizations: Not on file  . Attends Archivist Meetings: Not on file  . Marital Status: Not on file  Intimate Partner Violence:   . Fear of Current or Ex-Partner: Not on file  . Emotionally Abused: Not on file  . Physically Abused: Not on file  . Sexually Abused: Not on file    Review of systems: Review of Systems  Constitutional: Negative for fever and chills.  HENT: Negative.   Eyes: Negative for blurred vision.  Respiratory: as per HPI  Cardiovascular: Negative for chest pain and palpitations.  Gastrointestinal: Negative for  vomiting, diarrhea, blood per rectum. Genitourinary: Negative for dysuria, urgency, frequency and hematuria.  Musculoskeletal: Negative for myalgias, back pain and joint pain.  Skin: Negative for itching and rash.  Neurological: Negative for dizziness, tremors, focal weakness, seizures and loss of consciousness.  Endo/Heme/Allergies: Negative for environmental allergies.  Psychiatric/Behavioral: Negative for depression, suicidal ideas and hallucinations.  All other systems reviewed and are negative.  Physical Exam: Blood pressure 110/80, pulse (!) 109, temperature 97.6 F (36.4 C), temperature source Temporal, height 5\' 9"  (1.753 m), weight 188 lb (85.3 kg), SpO2 92 %. Gen:      No acute distress HEENT:  EOMI, sclera anicteric Neck:     No masses; no thyromegaly Lungs:    Bilateral crackles CV:         Regular rate and rhythm; no murmurs Abd:      + bowel sounds; soft, non-tender; no palpable masses, no distension Ext:    No edema; adequate peripheral perfusion Skin:      Warm and dry; no rash Neuro: alert and oriented x 3 Psych: normal mood and affect  Data Reviewed: Imaging: CT high-resolution 05/02/2020-UIP pattern pulmonary fibrosis progressive since 2013 I have reviewed the images personally.  PFTs: 05/27/2018 FVC 3.08 [77%], FEV1 2.81 [97%], F/F 91  normal spirometry, restriction possible  Labs: CTD serologies 04/29/2018-significant for elevated CCP at 47  Assessment:  Progressive pulmonary fibrosis Likely has IPF given UIP pattern Elevated CCP is likely nonsignificant.  He does have exposure to feather pillows but CT scan is not consistent with hypersensitivity pneumonitis  Start paperwork for resumption of Esbriet Schedule pulmonary function test Referral to pulmonary rehab I have asked him to get rid of the feather pillows  Chronic cough Likely secondary to pulmonary fibrosis and GERD Start Prilosec 40 mg a day Tessalon as needed  Plan/Recommendations: Start  paperwork for [employer] PFTs Pulmonary rehab Prilosec, Tessalon  Marshell Garfinkel MD [facility] 06/11/2020, 12:29 PM  CC: Vivi Barrack, MD

## 2020-06-11 NOTE — Telephone Encounter (Signed)
Spoke with the pt  He has an albuterol inhaler that he used to use and wonders if still needs it  I advised this is an emergency inhaler and not to use unless needed  Pt verbalized understanding and nothing further needed

## 2020-06-11 NOTE — Progress Notes (Signed)
prilosec

## 2020-06-12 ENCOUNTER — Telehealth: Payer: Self-pay | Admitting: Pharmacy Technician

## 2020-06-12 NOTE — Telephone Encounter (Signed)
Received some New start paperwork for ESBRIET. Will update as we work through the benefits process.

## 2020-06-12 NOTE — Telephone Encounter (Signed)
Submitted a Prior Authorization request to [employer] for [employer] via Cover My Meds. Will update once we receive a response.   [employer] Peer - PA Case ID: [id number]

## 2020-06-13 NOTE — Telephone Encounter (Signed)
Received notification from [employer] regarding a prior authorization for ESBRIET. Authorization has been APPROVED from 05/13/20 to 06/12/21.   Authorization # [id number]  Ran test claim, patient's copay will be $33.00. Patient must fill through [facility] order.  Please send in prescriptions to pharmacy. Patient will need both starter and maintenance dose.  Thanks! Beatriz Chancellor, CPhT

## 2020-06-13 NOTE — Telephone Encounter (Signed)
Dr Vaughan Browner please advise on strength and directions for Esbriet and then we can send rx, thanks.

## 2020-06-14 MED ORDER — ESBRIET 267 MG PO CAPS: ORAL_CAPSULE | ORAL | 0 refills | Status: DC

## 2020-06-14 NOTE — Telephone Encounter (Signed)
Rx for Esbriet sent to [facility]

## 2020-06-14 NOTE — Telephone Encounter (Signed)
Esbriet prescription  Days 1 to 7: 267 mg 3 times daily (total dose: 801 mg/day)  Days 8 to 14: 534 mg 3 times daily (total dose: 1,602 mg/day)  Day 15 and thereafter: 801 mg 3 times daily (total dose: 2,403 mg/day). Maximum dose: 2,403 mg/day.

## 2020-06-17 ENCOUNTER — Encounter (HOSPITAL_COMMUNITY): Payer: Self-pay | Admitting: *Deleted

## 2020-06-17 NOTE — Progress Notes (Signed)
Received referral from Dr. Vaughan Browner for this pt to participate in pulmonary rehab with the the diagnosis of ILD. Clinical review of pt follow up appt on 11/1 Pulmonary office note.  Also his PCP visit. Pt with Covid Risk Score - 4. Pt appropriate for scheduling for Pulmonary rehab.  Will forward to support staff for scheduling and verification of insurance eligibility/benefits with pt consent. Cherre Huger, BSN Cardiac and Training and development officer

## 2020-06-19 ENCOUNTER — Telehealth: Payer: Self-pay | Admitting: Psychiatry

## 2020-06-19 ENCOUNTER — Telehealth: Payer: Self-pay | Admitting: Pulmonary Disease

## 2020-06-19 ENCOUNTER — Telehealth (HOSPITAL_COMMUNITY): Payer: Self-pay | Admitting: Family Medicine

## 2020-06-19 MED ORDER — ESBRIET 267 MG PO CAPS: ORAL_CAPSULE | ORAL | 0 refills | Status: AC

## 2020-06-19 NOTE — Telephone Encounter (Signed)
This suggests that the [place] originally given to him which were 1 mg and 2 mg tablets may have been too high.  Rexulti generally does not cause more tiredness than does Abilify.  If he is satisfied to return to Abilify then okay but the other option would be to try 0.5 mg daily of Rexulti.

## 2020-06-19 NOTE — Telephone Encounter (Signed)
Pt called to report on new med Rexulti he tried and made him very sleepy and tired. Went back to previous med

## 2020-06-19 NOTE — Telephone Encounter (Signed)
Rachael or Devki, can you please help [place] figure out correct pharmacy information for [facility] where pt's Esbriet needs to be sent to.

## 2020-06-19 NOTE — Telephone Encounter (Signed)
Rx for pt's Esbriet has been sent to [facility] for pt. Nothing further needed.

## 2020-06-19 NOTE — Telephone Encounter (Signed)
Please send prescription to [facility]. [employer] is part of [facility]

## 2020-06-20 ENCOUNTER — Telehealth (HOSPITAL_COMMUNITY): Payer: Self-pay | Admitting: Family Medicine

## 2020-06-20 NOTE — Telephone Encounter (Signed)
Left voicemail with options and to call back.

## 2020-06-21 ENCOUNTER — Telehealth: Payer: Self-pay | Admitting: Pulmonary Disease

## 2020-06-21 NOTE — Telephone Encounter (Signed)
Left patient another msg

## 2020-06-21 NOTE — Telephone Encounter (Signed)
[facility] saying that additional info was needed to process his Esbriet rx. Attempted to call [facility], was put on hold X 5 minutes then line was ended. Called [facility] again, was again put on hold X 10 minutes with no answer from representative.  Wcb.

## 2020-06-22 ENCOUNTER — Telehealth (HOSPITAL_COMMUNITY): Payer: Self-pay

## 2020-06-22 ENCOUNTER — Encounter (HOSPITAL_COMMUNITY): Payer: Self-pay

## 2020-06-22 NOTE — Telephone Encounter (Signed)
Called pt in regards to pulmonary rehab and also to ask him which insurance he wanted to use. LMTCB Mailed letter

## 2020-06-22 NOTE — Telephone Encounter (Signed)
Pt insurance is active and benefits verified through Medicare a/b Co-pay 0, DED $203/$203 met, out of pocket 0/0 met, co-insurance 20%. no pre-authorization required.   Pt also has [employer].

## 2020-06-22 NOTE — Telephone Encounter (Signed)
Pt called back and stated that he is interested in the pulmonary rehab program and that he will call [employer] to make sure they cover pulmonary rehab. I advised pt that we are scheduling out til the first of the year and that I would give him a call back in Dec.

## 2020-06-23 NOTE — Telephone Encounter (Signed)
Sp[facility] to [place] [facility].  Treenee stated that dispense amount for Esbriet does not match sig. [place] is questioning if additional tablets should be given? Rx was written for 207 tablets.   Dr. Charlane Ferretti advise. Thanks   Direct pharmacy number [phone] Ref # [id number]

## 2020-06-25 NOTE — Telephone Encounter (Signed)
CCing the pharmacy team to review

## 2020-06-25 NOTE — Telephone Encounter (Signed)
Called [employer] and clarified Esbriet starting taper. Directions had to be updated to remove "for 7 days" and replaced with "thereafter" for the end of the taper. #207 caps is a 30 day supply. Original directions read like it should have been a 21 day supply.   Patient will need maintenance dose sent in to [facility].   [facility] pharmacy number [phone] Ref # [id number]

## 2020-06-25 NOTE — Telephone Encounter (Signed)
Patient called and s/w [facility].  Need to know the quantity to fill for Esbriet and how many to take a day.  Number for prior auth:  [id number] ([facility]).  Also gave [phone] (main number to [facility]).  Please advise.

## 2020-06-25 NOTE — Telephone Encounter (Signed)
Left message to call back to discuss.

## 2020-06-25 NOTE — Telephone Encounter (Signed)
Called and spoke with pt letting him know that we did call [facility] to clarify instructions and quantity for his Esbriet Rx. Pt verbalized understanding.nothing further needed.

## 2020-06-27 ENCOUNTER — Telehealth: Payer: Self-pay | Admitting: Psychiatry

## 2020-06-27 NOTE — Telephone Encounter (Signed)
See previous phone message. 

## 2020-06-27 NOTE — Telephone Encounter (Signed)
Pt LM on VM stating he would like Traci to call his home # [phone]. His wife will take the call. It's hard to answer his cell when he's working. Keep missing call

## 2020-06-27 NOTE — Telephone Encounter (Signed)
Patient had left voicemail that it was okay to talk to his wife, spoke with her and given the information and choices of staying on Abilify or trying lower dose of Rexulti 0.5 mg. She said she would let him know and he would call back to let us know.

## 2020-06-28 ENCOUNTER — Telehealth: Payer: Self-pay

## 2020-06-28 ENCOUNTER — Other Ambulatory Visit: Payer: Self-pay | Admitting: Psychiatry

## 2020-06-28 DIAGNOSIS — F5105 Insomnia due to other mental disorder: Secondary | ICD-10-CM

## 2020-06-28 MED ORDER — LEVOTHYROXINE SODIUM 112 MCG PO TABS: 112.0000 ug | ORAL_TABLET | Freq: Every day | ORAL | 3 refills | Status: DC

## 2020-06-28 NOTE — Telephone Encounter (Signed)
Rx sent in

## 2020-06-28 NOTE — Telephone Encounter (Signed)
MEDICATION: LEVOXYL 112 MCG tablet  PHARMACY: [place], [place] Phone:  [phone]  Fax:  [phone]       Comments:   **Let patient know to contact pharmacy at the end of the day to make sure medication is ready. **  ** Please notify patient to allow 48-72 hours to process**  **Encourage patient to contact the pharmacy for refills or they can request refills through [facility]**

## 2020-07-10 ENCOUNTER — Other Ambulatory Visit: Payer: Self-pay | Admitting: Pulmonary Disease

## 2020-07-10 DIAGNOSIS — J849 Interstitial pulmonary disease, unspecified: Secondary | ICD-10-CM

## 2020-07-11 ENCOUNTER — Ambulatory Visit (INDEPENDENT_AMBULATORY_CARE_PROVIDER_SITE_OTHER): Payer: Medicare Other | Admitting: Pulmonary Disease

## 2020-07-11 ENCOUNTER — Other Ambulatory Visit: Payer: Self-pay

## 2020-07-11 DIAGNOSIS — J849 Interstitial pulmonary disease, unspecified: Secondary | ICD-10-CM

## 2020-07-11 LAB — PULMONARY FUNCTION TEST
FEF 25-75 Post: 4.32 L/sec
FEF 25-75 Pre: 4.07 L/sec
FEF2575-%Change-Post: 6 %
FEF2575-%Pred-Post: 211 %
FEF2575-%Pred-Pre: 199 %
FEV1-%Change-Post: 3 %
FEV1-%Pred-Post: 77 %
FEV1-%Pred-Pre: 75 %
FEV1-Post: 2.19 L
FEV1-Pre: 2.13 L
FEV1FVC-%Change-Post: 2 %
FEV1FVC-%Pred-Pre: 121 %
FEV6-%Change-Post: 1 %
FEV6-%Pred-Post: 66 %
FEV6-%Pred-Pre: 65 %
FEV6-Post: 2.42 L
FEV6-Pre: 2.39 L
FEV6FVC-%Change-Post: 0 %
FEV6FVC-%Pred-Post: 106 %
FEV6FVC-%Pred-Pre: 106 %
FVC-%Change-Post: 0 %
FVC-%Pred-Post: 62 %
FVC-%Pred-Pre: 61 %
FVC-Post: 2.42 L
FVC-Pre: 2.4 L
Post FEV1/FVC ratio: 91 %
Post FEV6/FVC ratio: 100 %
Pre FEV1/FVC ratio: 89 %
Pre FEV6/FVC Ratio: 100 %
RV % pred: 44 %
RV: 1.08 L
TLC % pred: 51 %
TLC: 3.43 L

## 2020-07-11 NOTE — Progress Notes (Signed)
PFT done today. 

## 2020-07-12 ENCOUNTER — Ambulatory Visit (INDEPENDENT_AMBULATORY_CARE_PROVIDER_SITE_OTHER): Payer: Medicare Other | Admitting: Pulmonary Disease

## 2020-07-12 ENCOUNTER — Encounter: Payer: Self-pay | Admitting: Pulmonary Disease

## 2020-07-12 VITALS — BP 122/80 | HR 95 | Temp 98.2°F | Ht 69.0 in | Wt 192.0 lb

## 2020-07-12 DIAGNOSIS — J849 Interstitial pulmonary disease, unspecified: Secondary | ICD-10-CM | POA: Diagnosis not present

## 2020-07-12 DIAGNOSIS — R0602 Shortness of breath: Secondary | ICD-10-CM | POA: Diagnosis not present

## 2020-07-12 DIAGNOSIS — J84112 Idiopathic pulmonary fibrosis: Secondary | ICD-10-CM

## 2020-07-12 DIAGNOSIS — Z5181 Encounter for therapeutic drug level monitoring: Secondary | ICD-10-CM

## 2020-07-12 LAB — COMPREHENSIVE METABOLIC PANEL
ALT: 18 U/L (ref 0–53)
AST: 22 U/L (ref 0–37)
Albumin: 4.4 g/dL (ref 3.5–5.2)
Alkaline Phosphatase: 71 U/L (ref 39–117)
BUN: 16 mg/dL (ref 6–23)
CO2: 29 mEq/L (ref 19–32)
Calcium: 9.5 mg/dL (ref 8.4–10.5)
Chloride: 100 mEq/L (ref 96–112)
Creatinine, Ser: 0.84 mg/dL (ref 0.40–1.50)
GFR: 85.13 mL/min (ref >60.00)
Glucose, Bld: 99 mg/dL (ref 70–99)
Potassium: 4.3 mEq/L (ref 3.5–5.1)
Sodium: 137 mEq/L (ref 135–145)
Total Bilirubin: 0.6 mg/dL (ref 0.2–1.2)
Total Protein: 7.4 g/dL (ref 6.0–8.3)

## 2020-07-12 MED ORDER — OMEPRAZOLE 40 MG PO CPDR: 40.0000 mg | DELAYED_RELEASE_CAPSULE | Freq: Two times a day (BID) | ORAL | 3 refills | Status: AC

## 2020-07-12 MED ORDER — ESBRIET 801 MG PO TABS: 801.0000 mg | ORAL_TABLET | Freq: Three times a day (TID) | ORAL | 4 refills | Status: DC

## 2020-07-12 NOTE — Addendum Note (Signed)
Addended by: Lia Foyer R on: 07/12/2020 10:39 AM   Modules accepted: Orders

## 2020-07-12 NOTE — Patient Instructions (Addendum)
I am glad you are tolerating the Esbriet medication well We will increase dose to 801 mg 3 times daily and call in a prescription for Esbriet Increase Prilosec to 40 mg twice daily to help with the cough Continue the Tessalon for cough  We will check a comprehensive metabolic panel and proBNP today and order a repeat comprehensive metabolic panel in 1 month  Follow-up in clinic in 2 months.

## 2020-07-12 NOTE — Addendum Note (Signed)
Addended by: Suzzanne Cloud E on: 07/12/2020 10:36 AM   Modules accepted: Orders

## 2020-07-12 NOTE — Telephone Encounter (Signed)
No response from pt.  Closed referral  

## 2020-07-12 NOTE — Progress Notes (Signed)
Bryan Tyler    [id number]    Apr 24, 1946  Primary Care Physician:Parker, Algis Greenhouse, MD  Referring Physician: Vivi Barrack, [address] [place],  [place] [address]  Chief complaint:  Follow-up for IPF Started Esbriet November 721  HPI: 74 year old with history of pulmonary fibrosis, dyspnea, GERD, hypertension, hyperlipidemia Previously followed by Dr. Chase Caller.  Diagnosed with pulmonary fibrosis in 2019.  He was initially prescribed Ofev but denied by insurance Esbriet was then started.  He took this for 1 month and then stopped  the medication He was feeling well until December of this year when he had worsening.  Treated for pneumonia with multiple rounds of antibiotics and steroids with no improvement He is here for follow-up and reassessment.  Has significant dyspnea on exertion, cough with no mucus  Pets: Has a dog.  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  He has feather pillows which he has used for many years.  He got rid of it in November 2021 Smoking history: 20-pack-year smoker.  Quit in 1980 Travel history: Previously lived in [place].  No significant recent travel history Relevant family history: No family history of lung disease  Interim history: Started Esbriet 2 weeks ago.  He is tolerating it fine with no issues Continues to have cough and Tessalon does not help.  Outpatient Encounter Medications as of 07/12/2020  Medication Sig   albuterol (PROVENTIL HFA;VENTOLIN HFA) 108 (90 BASE) MCG/ACT inhaler Inhale 2 puffs into the lungs every 6 (six) hours as needed for wheezing.   albuterol (VENTOLIN HFA) 108 (90 Base) MCG/ACT inhaler Inhale 2 puffs into the lungs every 6 (six) hours as needed for wheezing or shortness of breath.   ARIPiprazole (ABILIFY) 2 MG tablet Take 1 tablet (2 mg total) by mouth daily.   atorvastatin (LIPITOR) 40 MG tablet TAKE 1 TABLET EVERY  EVENING   benzonatate (TESSALON) 200 MG capsule Take 1 capsule (200 mg total) by mouth 2 (two) times daily as needed for cough.   clonazePAM (KLONOPIN) 1 MG tablet Take 1.5 tablets (1.5 mg total) by mouth at bedtime as needed for anxiety.   gabapentin (NEURONTIN) 800 MG tablet TAKE 1 TABLET(800 MG) BY MOUTH AT BEDTIME   levothyroxine (LEVOXYL) 112 MCG tablet Take 1 tablet (112 mcg total) by mouth daily.   Multiple Vitamin (MULTIVITAMIN WITH MINERALS) TABS tablet Take 1 tablet by mouth daily.   Naftifine HCl 2 % CREA Apply thin layer to affected area once daily for 2-3 weeks.   omeprazole (PRILOSEC) 40 MG capsule Take 1 capsule (40 mg total) by mouth daily as needed (heartburn).   omeprazole (PRILOSEC) 40 MG capsule Take 1 capsule (40 mg total) by mouth daily.   omeprazole (PRILOSEC) 40 MG capsule Take 1 capsule (40 mg total) by mouth daily.   oxymetazoline (ANEFRIN NASAL SPRAY) 0.05 % nasal spray Place 2 sprays into both nostrils 3 (three) times daily as needed for congestion.    Pirfenidone (ESBRIET PO) Take by mouth.   tamsulosin (FLOMAX) 0.4 MG CAPS capsule Take 1 capsule (0.4 mg total) by mouth daily. (Patient taking differently: Take 0.4 mg by mouth every evening. )   venlafaxine XR (EFFEXOR-XR) 150 MG 24 hr capsule TAKE 2 CAPSULES DAILY WITH BREAKFAST   No facility-administered encounter medications on file as of 07/12/2020.    Physical Exam: Blood pressure 122/80, pulse 95, temperature 98.2 F (36.8 C), temperature  source Temporal, height 5\' 9"  (1.753 m), weight 192 lb (87.1 kg), SpO2 95 %. Gen:      No acute distress HEENT:  EOMI, sclera anicteric Neck:     No masses; no thyromegaly Lungs:    Bibasal crackles CV:         Regular rate and rhythm; no murmurs Abd:      + bowel sounds; soft, non-tender; no palpable masses, no distension Ext:    No edema; adequate peripheral perfusion Skin:      Warm and dry; no rash Neuro: alert and oriented x 3 Psych: normal mood and  affect  Data Reviewed: Imaging: CT high-resolution 05/02/2020-UIP pattern pulmonary fibrosis progressive since 2013 I have reviewed the images personally.  PFTs: 05/27/2018 FVC 3.08 [77%], FEV1 2.81 [97%], F/F 91  normal spirometry, restriction possible  07/11/2020 FVC 2.42 [62%], FEV1 2.19 [77%], F/F 91, TLC 3.43 [51%] Severe restriction.  Unable to complete diffusion capacity.   Labs: CTD serologies 04/29/2018-significant for elevated CCP at 47  Assessment:  Progressive pulmonary fibrosis Likely has IPF given UIP pattern Elevated CCP is likely nonsignificant.  He does have prior exposure to feather pillows but CT scan is not consistent with hypersensitivity pneumonitis.    Continues on Esbriet.  He will escalate to max dose Check CMP and proBNP for monitoring He has been referred to pulmonary rehab and is scheduled to start January 2021  Chronic cough Likely secondary to pulmonary fibrosis and GERD Increase Prilosec to 40 mg twice daily Tessalon as needed  Discussed use of codeine cough syrup but he would like to avoid.  Consider Neurontin if cough is persistent.  Plan/Recommendations: Continue Esbriet.  Send in refills Prilosec 40 twice daily, Tessalon for cough Pulmonary rehab Check CMP, proBNP  Marshell Garfinkel MD [facility] 07/12/2020, 10:03 AM  CC: Vivi Barrack, MD

## 2020-07-13 LAB — PRO B NATRIURETIC PEPTIDE: NT-Pro BNP: 27 pg/mL (ref 0–486)

## 2020-07-17 DIAGNOSIS — R059 Cough, unspecified: Secondary | ICD-10-CM

## 2020-07-17 NOTE — Telephone Encounter (Signed)
Dr. Mannam, please see mychart message sent by pt and advise. 

## 2020-07-20 ENCOUNTER — Other Ambulatory Visit: Payer: Self-pay | Admitting: Cardiothoracic Surgery

## 2020-07-20 DIAGNOSIS — I712 Thoracic aortic aneurysm, without rupture, unspecified: Secondary | ICD-10-CM

## 2020-07-20 MED ORDER — GABAPENTIN 100 MG PO CAPS: 300.0000 mg | ORAL_CAPSULE | Freq: Two times a day (BID) | ORAL | Status: DC

## 2020-07-20 NOTE — Telephone Encounter (Signed)
Dr.Mannam please advise on patient mychart message   Benzonatate Caps 200 mg twice a day is not working well for me.  Is there another medication for my coughing that would be more effective for me?  I am very uncomfortable.  Send script to [facility] in [place].  Thank you.    Docia Furl.

## 2020-07-20 NOTE — Telephone Encounter (Signed)
As per our discussion in office he would like to avoid cough syrup with codeine  I have already called in Neurontin 300 mg twice daily to be used in addition to the 800 mg at night that he is already on from his psychiatrist.  Advised him to try swallowing peanut butter which can sometimes help with the cough.

## 2020-07-24 ENCOUNTER — Other Ambulatory Visit: Payer: Self-pay | Admitting: Psychiatry

## 2020-07-24 DIAGNOSIS — F5105 Insomnia due to other mental disorder: Secondary | ICD-10-CM

## 2020-07-26 MED ORDER — ESBRIET 801 MG PO TABS: 1.0000 | ORAL_TABLET | Freq: Three times a day (TID) | ORAL | 0 refills | Status: DC

## 2020-07-26 NOTE — Telephone Encounter (Signed)
Ok to submit 30 day supply of esbriet to [facility]

## 2020-07-26 NOTE — Telephone Encounter (Signed)
Please see mychart message sent by pt and advise.   To: [facility] POOL    From: Bryan LEBEAU "Chet"    Created: 07/26/2020 2:19 PM     *-*-*This message was handled on 07/26/2020 2:23 PM by Nikolas Casher P*-*-*  I have contacted [facility] about not receiving the 90 days suppy of Esbriet 801 capsules in the mail.  It was prescribed two weeks ago for me  and submitted to [facility] in a timely manner.  The representative I talked to yesterday at [place] believes the medication has been lost in the mail.  The rep. asked me to request that a 30 day supply of Esbriet 801 capsules be submitted for me  to my local [facility] in [place].  I have a two day supply of the Esbriet left.  I will then be out of the medication.    Thanks you, in advance, for your support.  Bryan Tyler) Jefm Miles  [phone]       Can med even be sent to pt's local pharmacy?

## 2020-07-26 NOTE — Telephone Encounter (Signed)
Called pt's pharmacy about pt's Esbriet medication. Was told that due to the medication being a specialty medication, it is one that they do not carry and it would have to be ordered. Stated by pharmacy that pt could call [facility] at [phone] to further discuss this with them.  mychart message has been sent to pt with this info.

## 2020-08-01 ENCOUNTER — Telehealth: Payer: Self-pay | Admitting: Interventional Cardiology

## 2020-08-01 ENCOUNTER — Ambulatory Visit (INDEPENDENT_AMBULATORY_CARE_PROVIDER_SITE_OTHER): Payer: Medicare Other | Admitting: Psychiatry

## 2020-08-01 ENCOUNTER — Other Ambulatory Visit: Payer: Self-pay

## 2020-08-01 ENCOUNTER — Encounter: Payer: Self-pay | Admitting: Psychiatry

## 2020-08-01 DIAGNOSIS — F5105 Insomnia due to other mental disorder: Secondary | ICD-10-CM | POA: Diagnosis not present

## 2020-08-01 DIAGNOSIS — R251 Tremor, unspecified: Secondary | ICD-10-CM | POA: Diagnosis not present

## 2020-08-01 DIAGNOSIS — F331 Major depressive disorder, recurrent, moderate: Secondary | ICD-10-CM | POA: Diagnosis not present

## 2020-08-01 DIAGNOSIS — F411 Generalized anxiety disorder: Secondary | ICD-10-CM | POA: Diagnosis not present

## 2020-08-01 MED ORDER — DULOXETINE HCL 30 MG PO CPEP: ORAL_CAPSULE | ORAL | 1 refills | Status: DC

## 2020-08-01 MED ORDER — VENLAFAXINE HCL ER 75 MG PO CP24: ORAL_CAPSULE | ORAL | 0 refills | Status: DC

## 2020-08-01 NOTE — Telephone Encounter (Signed)
Called and spoke to patient. Made him aware that at his last OV 11/01/19 he reported that he was not taking it. It has been in his drawer all of this time. He states that his BP has been around 130/80. Made patient aware that he can remain off of it and that he should let us know if his BP becomes elevated. He verbalized understanding and thanked me for the call.

## 2020-08-01 NOTE — Patient Instructions (Signed)
Reduce Venlafaxine to 3 of the 75 mg capsules and add 30 mg duloxetine for 1 week, Then reduce venlafaxine to 2 of the 75 mg capsules and increase duloxetine to 2 capsules daily for 1 week, Then reduce venlafaxine to one of the 75 mg capsules and increase duloxetine to 3 capsules daily for 1 week, Then stop venlafaxine and continue 3 duloxetine daily

## 2020-08-01 NOTE — Progress Notes (Signed)
Bryan Tyler [id number] May 27, 1946 74 y.o.  Subjective:   Patient ID:  Bryan Tyler is a 74 y.o. (DOB 1945-11-11) male.  Chief Complaint:  Chief Complaint  Patient presents with  . Follow-up  . Depression  . Fatigue    HPI Bryan Tyler presents to the office today for follow-up of history of major depression and anxiety and insomnia.  seen June 2020.  The only medication change was to reduce Abilify from 5 mg to 2 mg daily to see if tremor would improve.  He had residual anxiety but not much depression at the time.  Patient called April 22, 2019 asking for refill of clonazepam early indicating he had increased the dosages on his own to 1.5 mg instead of 1 mg.  He was informed he could not change the dosage of a controlled substance without our permission.  seen December 2020.  The following was noted: Tremor resolved with reduction in Abilify.  Wants to go through meds and their purpose. No history of RLS.   Ran out of Klonopin lately and only sleeping 3-4  Hours.  Usually sleeps oK with 1 mg nightly. Overall anxiety and depression and sleep are managed.  Because of polypharmacy we elected to try to discontinue buspirone and he was to let us know if he had any rebound anxiety.  December 07, 2019 appointment the following is noted: More trouble with sleep and Klonopin 1 mg no longer getting him to sleep and will take another 1/2 tablet.  Then runs out of the Klonopin early. No SE from 1.5 mg Klonopin. Sleep 7 hours nightly.  45 nap in day.   2 cups coffee AM, not daily other caffeine but sometimes tea with dinner.   Work 3 days week. Plan: No med changes  06/05/2020 appointment with the following noted: Rough physically, pneumonia July and then dx pulm fibrosis.  Feels not well.  No Covid.  Not smoker. Stayed on Effexor XR 300, Abilify 2 mg  And clonazepam 1.0-1.5 mg HS. Plan: He wonders about med change to try to help with the recurrent depression.  His depression  had been in remission on Abilify 5 mg and then 2 mg daily. The most logical alternative would be switching from Abilify to Rexulti 1 mg for 1 week and then if no response increase to 2 mg.  He had tremors on 5 mg of Abilify so going up and Abilify is not a good option.  08/01/2020 appointment with the following noted: He called since being here indicating the [place] was making him too tired.  He was offered the option of reducing the dose or returning to Abilify. He thinks he returned to Abilify but not certain. Function OK but thinks he's still depressed.    Pulmonary fibrosis dx and RX Esbriet and it makes him feel out of it.  Anxiety not too bad.  Depression less well controlled with health stress.  Anxiety symptoms include: Excessive Worry,., worse at night lying down when not distracted.   Pt reports couldn't go to sleep before clonazepam and when ran out. Has run out of it at times and can't sleep.  Pt reports that appetite is good. Pt reports that energy is less good.  Concentration is good. Suicidal thoughts:  denied by patient.  Past Psychiatric Medication Trials: Effexor 300, Abilify 5 started 2017,  Rexulti 2 mg tired  Buspirone 15 BID, Clonazepam 1 &1/2 mg nightly Gabapentin 800 nightly Belsomra Lorazepam Mirtazapine 30 Trazodone 150  Review  of Systems:  Review of Systems  Respiratory: Positive for shortness of breath.   Cardiovascular: Negative for chest pain.       Enlarged aorta with follow-up next month.  Neurological: Negative for dizziness, tremors and weakness.  Neuro said no Parkinson's dz.  Medications: I have reviewed the patient's current medications.  Current Outpatient Medications  Medication Sig Dispense Refill  . albuterol (PROVENTIL HFA;VENTOLIN HFA) 108 (90 BASE) MCG/ACT inhaler Inhale 2 puffs into the lungs every 6 (six) hours as needed for wheezing.    Marland Kitchen albuterol (VENTOLIN HFA) 108 (90 Base) MCG/ACT inhaler Inhale 2 puffs into the lungs every 6  (six) hours as needed for wheezing or shortness of breath. 18 g 2  . ARIPiprazole (ABILIFY) 2 MG tablet Take 1 tablet (2 mg total) by mouth daily. 90 tablet 1  . atorvastatin (LIPITOR) 40 MG tablet TAKE 1 TABLET EVERY EVENING 90 tablet 3  . benzonatate (TESSALON) 200 MG capsule Take 1 capsule (200 mg total) by mouth 2 (two) times daily as needed for cough. 60 capsule 1  . clonazePAM (KLONOPIN) 1 MG tablet TAKE ONE AND ONE-HALF TABLETS AT BEDTIME AS NEEDED FOR ANXIETY 135 tablet 0  . gabapentin (NEURONTIN) 800 MG tablet TAKE 1 TABLET(800 MG) BY MOUTH AT BEDTIME 90 tablet 1  . levothyroxine (LEVOXYL) 112 MCG tablet Take 1 tablet (112 mcg total) by mouth daily. 90 tablet 3  . Multiple Vitamin (MULTIVITAMIN WITH MINERALS) TABS tablet Take 1 tablet by mouth daily.    . Naftifine HCl 2 % CREA Apply thin layer to affected area once daily for 2-3 weeks. 45 g 0  . omeprazole (PRILOSEC) 40 MG capsule Take 1 capsule (40 mg total) by mouth in the morning and at bedtime. 90 capsule 3  . oxymetazoline (AFRIN) 0.05 % nasal spray Place 2 sprays into both nostrils 3 (three) times daily as needed for congestion.     . Pirfenidone (ESBRIET) 801 MG TABS Take 801 mg by mouth in the morning, at noon, and at bedtime. 90 tablet 4  . Pirfenidone (ESBRIET) 801 MG TABS Take 1 tablet by mouth in the morning, at noon, and at bedtime. 90 tablet 0  . tamsulosin (FLOMAX) 0.4 MG CAPS capsule Take 1 capsule (0.4 mg total) by mouth daily. (Patient taking differently: Take 0.4 mg by mouth every evening.) 30 capsule 3  . DULoxetine (CYMBALTA) 30 MG capsule 1 capsule daily for 1 week, then 2 capsules daily for 1 week, then 3 capsules daily 90 capsule 1  . venlafaxine XR (EFFEXOR XR) 75 MG 24 hr capsule 3 daily for 1 week, then 2 daily for 1 week, then 1 daily for 1 week, then stop 42 capsule 0   Current Facility-Administered Medications  Medication Dose Route Frequency Provider Last Rate Last Admin  . gabapentin (NEURONTIN) capsule  300 mg  300 mg Oral BID Mannam, Praveen, MD        Medication Side Effects: Other: brief dizzy when stands up  Allergies:  Allergies  Allergen Reactions  . Codeine Nausea And Vomiting and Other (See Comments)    DIZZINESS  . Amoxicillin-Pot Clavulanate   . Ciprofloxacin Other (See Comments)    Unknown    Past Medical History:  Diagnosis Date  . Adenocarcinoma of prostate ([place]) 04/16/2013  . Aneurysm, aorta, thoracic ([place])   . Anxiety   . Arthritis    "knees" (05/21/2017)  . Depression   . Diverticulosis   . Dyspnea    on exertion for a  long period time  . GERD (gastroesophageal reflux disease)   . Hyperlipidemia   . Hypertension   . Hypothyroidism, postradioiodine therapy    1980's  . Nocturia   . Organic impotence   . OSA (obstructive sleep apnea)    NO CPAP--  S/P SURGERY  2002  . Osteoporosis   . Pneumonia 1970  . PONV (postoperative nausea and vomiting)   . Urge urinary incontinence   . Wears contact lenses     Family History  Problem Relation Age of Onset  . Cancer Mother        Pancreatic Cancer  . Heart disease Father 93       CABG    Social History   Socioeconomic History  . Marital status: Married    Spouse name: Not on file  . Number of children: Not on file  . Years of education: Not on file  . Highest education level: Not on file  Occupational History  . Occupation: Patent examiner  Tobacco Use  . Smoking status: Former Smoker    Packs/day: 1.00    Years: 20.00    Pack years: 20.00    Types: Cigarettes    Quit date: 08/11/1978    Years since quitting: 42.0  . Smokeless tobacco: Never Used  Vaping Use  . Vaping Use: Never used  Substance and Sexual Activity  . Alcohol use: Yes    Comment: 05/21/2017 "1-2 beers/month"  . Drug use: No  . Sexual activity: Not Currently  Other Topics Concern  . Not on file  Social History Narrative  . Not on file   Social Determinants of Health   Financial Resource Strain: Not on file  Food  Insecurity: Not on file  Transportation Needs: Not on file  Physical Activity: Not on file  Stress: Not on file  Social Connections: Not on file  Intimate Partner Violence: Not on file    Past Medical History, Surgical history, Social history, and Family history were reviewed and updated as appropriate.   Please see review of systems for further details on the patient's review from today.   Objective:   Physical Exam:  There were no vitals taken for this visit.  Physical Exam Constitutional:      General: He is not in acute distress.    Appearance: He is well-developed.  Musculoskeletal:        General: No deformity.  Neurological:     Mental Status: He is alert and oriented to person, place, and time.     Motor: Tremor present.     Coordination: Coordination normal.     Comments: Tremor minimal  Psychiatric:        Attention and Perception: Attention and perception normal. He does not perceive auditory or visual hallucinations.        Mood and Affect: Mood is anxious and depressed. Affect is not labile, blunt, angry, tearful or inappropriate.        Speech: Speech normal.        Behavior: Behavior normal.        Thought Content: Thought content normal. Thought content does not include homicidal or suicidal ideation. Thought content does not include homicidal or suicidal plan.        Cognition and Memory: Cognition and memory normal.        Judgment: Judgment normal.     Comments: Insight intact. No delusions.      Lab Review:     Component Value Date/Time   NA  137 07/12/2020 1037   NA 137 11/16/2019 1001   K 4.3 07/12/2020 1037   CL 100 07/12/2020 1037   CO2 29 07/12/2020 1037   GLUCOSE 99 07/12/2020 1037   BUN 16 07/12/2020 1037   BUN 12 11/16/2019 1001   CREATININE 0.84 07/12/2020 1037   CREATININE 0.86 05/25/2020 0959   CALCIUM 9.5 07/12/2020 1037   CALCIUM 9.8 07/09/2011 0819   PROT 7.4 07/12/2020 1037   ALBUMIN 4.4 07/12/2020 1037   AST 22 07/12/2020  1037   ALT 18 07/12/2020 1037   ALKPHOS 71 07/12/2020 1037   BILITOT 0.6 07/12/2020 1037   GFRNONAA 85 11/16/2019 1001   GFRAA 98 11/16/2019 1001       Component Value Date/Time   WBC 9.5 05/25/2020 0959   RBC 4.85 05/25/2020 0959   HGB 15.0 05/25/2020 0959   HCT 44.4 05/25/2020 0959   PLT 309 05/25/2020 0959   MCV 91.5 05/25/2020 0959   MCH 30.9 05/25/2020 0959   MCHC 33.8 05/25/2020 0959   RDW 12.9 05/25/2020 0959   LYMPHSABS 2.1 01/02/2020 1608   MONOABS 0.9 01/02/2020 1608   EOSABS 0.3 01/02/2020 1608   BASOSABS 0.0 01/02/2020 1608    No results found for: POCLITH, LITHIUM   No results found for: PHENYTOIN, PHENOBARB, VALPROATE, CBMZ   .res Assessment: Plan:    Bryan Tyler was seen today for follow-up, depression and fatigue.  Diagnoses and all orders for this visit:  Major depressive disorder, recurrent episode, moderate (HCC) -     venlafaxine XR (EFFEXOR XR) 75 MG 24 hr capsule; 3 daily for 1 week, then 2 daily for 1 week, then 1 daily for 1 week, then stop -     DULoxetine (CYMBALTA) 30 MG capsule; 1 capsule daily for 1 week, then 2 capsules daily for 1 week, then 3 capsules daily  Generalized anxiety disorder -     venlafaxine XR (EFFEXOR XR) 75 MG 24 hr capsule; 3 daily for 1 week, then 2 daily for 1 week, then 1 daily for 1 week, then stop -     DULoxetine (CYMBALTA) 30 MG capsule; 1 capsule daily for 1 week, then 2 capsules daily for 1 week, then 3 capsules daily  Insomnia due to mental condition  Tremor of unknown origin    Greater than 50% of 30 min face to face time with patient was spent on counseling and coordination of care. We discussed that Bryan Tyler has a long history of depression and anxiety the has been fairly well controlled in recent years.  He is on the maximum dosage of venlafaxine and when he had a recurrence of depression in 2017, aripiprazole 2 mg was added.  Symptoms of depression have gotten worse since he had a diagnosis of pulmonary fibrosis  recently made.  He has not seen the pulmonologist yet that occurs on Monday.  He wonders about med change to try to help with the recurrent depression.  His depression had been in remission on Abilify 5 mg and then 2 mg daily.  Bring meds to verify doses.  Option change Effexor to duloxetine or Trintellix.   Disc SE in detail and SSRI withdrawal sx. Reduce Venlafaxine to 3 of the 75 mg capsules and add 30 mg duloxetine for 1 week, Then reduce venlafaxine to 2 of the 75 mg capsules and increase duloxetine to 2 capsules daily for 1 week, Then reduce venlafaxine to one of the 75 mg capsules and increase duloxetine to 3 capsules daily for 1  week, Then stop venlafaxine and continue 3 duloxetine daily  Extensive discussion of sleep hygiene.  No caffeine after 3 pm.    Defer weaning gabapentin if insomnia is controlled at next visit to reduce polypharmacy.  We discussed the short-term risks associated with benzodiazepines including sedation and increased fall risk among others.  Discussed long-term side effect risk including dependence, potential withdrawal symptoms, and the potential eventual dose-related risk of dementia.  Also disc risk affecting balance and memory.  No higher dose than 1.5 mg but that dose will be allowed since failed other sleepers and tolerating it. He does not feel like he can do with any less clonazepam.  Cautioned again against exceeding dosage of clonazepam without my OK.  Supportive therapy in dealing with his new diagnosis of pulmonary fibrosis.  Encouraged him to take it 1 step at a time and see what pulmonology can offer.  They have suggested a med trial which is in process..  This appointment was 30 minutes  Follow-up 6 to 8 weeks  Bryan Parents, MD, DFAPA    Future Appointments  Date Time Provider [place]  09/06/2020 11:00 AM [facility] 1 [facility]  09/06/2020  1:30 PM Grace Isaac, MD [facility]  09/27/2020 10:00 AM Mannam,  Hart Robinsons, MD [facility] None    No orders of the defined types were placed in this encounter.   -------------------------------

## 2020-08-01 NOTE — Telephone Encounter (Signed)
     Pt c/o medication issue:  1. Name of Medication: losartan 100 mg  2. How are you currently taking this medication (dosage and times per day)?   3. Are you having a reaction (difficulty breathing--STAT)?   4. What is your medication issue? Pt found this medication in his drawer and wasn't sure if he supposed to take it or not

## 2020-08-07 ENCOUNTER — Encounter: Payer: Self-pay | Admitting: Family Medicine

## 2020-08-08 ENCOUNTER — Other Ambulatory Visit: Payer: Self-pay | Admitting: *Deleted

## 2020-08-08 ENCOUNTER — Other Ambulatory Visit: Payer: Self-pay

## 2020-08-08 MED ORDER — ATORVASTATIN CALCIUM 40 MG PO TABS
40.0000 mg | ORAL_TABLET | Freq: Every evening | ORAL | 3 refills | Status: DC
Start: 2020-08-08 — End: 2021-11-01

## 2020-08-08 MED ORDER — ATORVASTATIN CALCIUM 40 MG PO TABS
40.0000 mg | ORAL_TABLET | Freq: Every evening | ORAL | 3 refills | Status: DC
Start: 2020-08-08 — End: 2020-08-08

## 2020-08-08 NOTE — Telephone Encounter (Signed)
08/08/2020  When did patient actually start taking the Esbriet?  This was within the last week I would go ahead and continue to take the Esbriet and monitor symptoms and see if patient is able to tolerate the symptoms.  Typically with Esbriet you have more of a issue with nausea then lethargy or daytime sleepiness.  If the patient would like he can have a earlier office visit with Dr. Isaiah Serge than February/2022.  Will route to Dr. Isaiah Serge as Cleda Clarks, FNP

## 2020-08-08 NOTE — Telephone Encounter (Signed)
Dr. Isaiah Serge, please see mychart message sent by pt and advise:  To: [facility]OL    From: JOBANI SABADO "Chet"    Created: 08/07/2020 6:08 PM     *-*-*This message was handled on 08/08/2020 8:52 AM by Aundrea Horace P*-*-*  Good day, Dr. Isaiah Serge, The Espriet 801 mg tablets have finally arrived from [facility] after a three week delay in the mail. I started taking the [employer] 801 mg tablets and feel very  out of sorts, lethargic, and  sleepy.  This is not a good quality of  life for me.  I am contacting you for advice.  For now, can I break the tablets in half until I feel better? I will abide by your response.  Thank you.  Happy New Year.  I will see you again on February 17.  [place] [phone]       With Shaune Leeks showing that Dr. Isaiah Serge is off, I am also going to go ahead and send this to provider of the day for any advise as well. Arlys John, please advise.

## 2020-08-14 ENCOUNTER — Other Ambulatory Visit (INDEPENDENT_AMBULATORY_CARE_PROVIDER_SITE_OTHER): Payer: Medicare Other

## 2020-08-14 DIAGNOSIS — J849 Interstitial pulmonary disease, unspecified: Secondary | ICD-10-CM

## 2020-08-14 LAB — COMPREHENSIVE METABOLIC PANEL
ALT: 20 U/L (ref 0–53)
AST: 24 U/L (ref 0–37)
Albumin: 4.6 g/dL (ref 3.5–5.2)
Alkaline Phosphatase: 68 U/L (ref 39–117)
BUN: 14 mg/dL (ref 6–23)
CO2: 30 mEq/L (ref 19–32)
Calcium: 9.4 mg/dL (ref 8.4–10.5)
Chloride: 99 mEq/L (ref 96–112)
Creatinine, Ser: 0.9 mg/dL (ref 0.40–1.50)
GFR: 83.32 mL/min (ref >60.00)
Glucose, Bld: 102 mg/dL — ABNORMAL HIGH (ref 70–99)
Potassium: 4.3 mEq/L (ref 3.5–5.1)
Sodium: 136 mEq/L (ref 135–145)
Total Bilirubin: 0.5 mg/dL (ref 0.2–1.2)
Total Protein: 7.5 g/dL (ref 6.0–8.3)

## 2020-08-22 NOTE — Telephone Encounter (Signed)
email sent from patient.   Since July, 2021, when I was initially diagnosed with bilateral pneumonia at the [facility], I have been bothered with repeated coughing  and cold like symptoms, such as a  runny nose and a stuffed up nose.  Is there any medication that can help relieve these symptoms?  I am taking the Benzonate and Omeprazole as prescribed.   I usually have a dry cough.  I don't have any fever and haven't had a fever.  I did come in to the lab last week for a blood test to monitor my liver function, but I haven't heard if the results were OK.  I did get the results of the various tests that were performed, but that didn't tell me anything regarding my liver. Thanks so much for your follow through. Docia Furl  Dr. Vaughan Browner please advise on medication for "cold like symptoms" and liver function results

## 2020-08-23 NOTE — Telephone Encounter (Signed)
Liver tests are normal.  We can try flonase nasal spray for the nose symptoms.

## 2020-08-29 NOTE — Telephone Encounter (Signed)
I have noted the message that he has stopped Esbriet.  Can we try to get him in sooner than his scheduled visit in February if there is an opening? We can discuss alternate medications such as Ofev at return visit.

## 2020-08-29 NOTE — Telephone Encounter (Signed)
Patient sent email in regards to stopping Esbriet 3 times a day   Dr. Vaughan Browner please advise  I would like Dr. Vaughan Browner to know that as of January 14, I stopped taking the Espriet 801 Mg tablets three times a day. The Esbriet was making me feel ill. I was in a constant fog like state and couldn't even drive my car. The day after I stopped Esbriet my cough and runny nose stopped; I felt so much better. I do take the medication Venlafaxine 300 Mg ER capsules every morning at breakfast. I have been taking Venlafaxine for years, as prescribed by my psychiatrist, Dr. Clovis Pu, for depression. Shall I make an appointment very soon to discuss my condition with Dr. Concepcion Living? Thanks for your support.    [employer] "Bryan" Jefm Tyler

## 2020-08-31 ENCOUNTER — Ambulatory Visit: Payer: Medicare Other | Admitting: Pulmonary Disease

## 2020-09-03 NOTE — Progress Notes (Signed)
[address]       [place] [address]             [phone]                    Bryan Tyler Ehrenberg Medical Record [id number] Date of Birth: 1946-04-18  Referring: Lacretia Leigh, MD Primary Care: Vivi Barrack, MD Primary Cardiology:Jayadeep Irish Lack, MD Chief Complaint:    Chief Complaint  Patient presents with  . Thoracic Aortic Aneurysm    18 month f/u with Chest CT- 05/02/20     History of Present Illness: Patient returns to the office today for reevaluation of an incidental finding of a 4.2 cm ascending aortic dilatation.  The patient was first seen after  working around his house and fell off a 5 foot wall , he was standing approximately 3 rungs up and fractured ribs on the left lower chest.   Echocardiogram 2017 showed a trileaflet aortic valve.   Since last seen the patient has had his right shoulder replaced and right knee replacement.  The patient's CT scans have been noted extensive coronary calcification, but no definite symptoms of coronary artery disease.  He does note occasional episodes of "indigestion", not exertionally related   On close questioning the patient has no family history of aortic dissection aortic aneurysms or sudden death at a premature age from unexplained causes.   .  Patient has severe underlying pulmonary fibrosis in the pulmonary clinic and also cardiology on a regular basis.  We did not get a follow-up CT scan office today as originally planned because in the last year he has had 2 CTs of the chest done by cardiology 1 done by pulmonology  Current Activity/ Functional Status:  Patient is independent with mobility/ambulation, transfers, ADL's, IADL's.   Zubrod Score: At the time of surgery this patient's most appropriate activity status/level should be described as: [x]     0    Normal activity, no symptoms []     1    Restricted in physical strenuous activity but ambulatory, able to do out light work []      2    Ambulatory and capable of self care, unable to do work activities, up and about               >50 % of waking hours                              []     3    Only limited self care, in bed greater than 50% of waking hours []     4    Completely disabled, no self care, confined to bed or chair []     5    Moribund   Past Medical History:  Diagnosis Date  . Adenocarcinoma of prostate ([facility]) 04/16/2013  . Aneurysm, aorta, thoracic ([facility])   . Anxiety   . Arthritis    "knees" (05/21/2017)  . Depression   . Diverticulosis   . Dyspnea    on exertion for a long period time  . GERD (gastroesophageal reflux disease)   . Hyperlipidemia   . Hypertension   . Hypothyroidism, postradioiodine therapy    1980's  . Nocturia   . Organic impotence   . OSA (obstructive sleep apnea)    NO CPAP--  S/P SURGERY  2002  . Osteoporosis   . Pneumonia 1970  . PONV (  postoperative nausea and vomiting)   . Urge urinary incontinence   . Wears contact lenses     Past Surgical History:  Procedure Laterality Date  . HEMORRHOIDECTOMY WITH HEMORRHOID BANDING  2007  . INGUINAL HERNIA REPAIR Bilateral    x4  (2 each side)  . INSERTION PROSTATE RADIATION SEED    . KNEE ARTHROSCOPY Right 2012  . NASAL SEPTUM SURGERY  1982   w/ rhinoplasty  . PROSTATE BIOPSY    . RADIOACTIVE SEED IMPLANT N/A 05/30/2013   Procedure: RADIOACTIVE SEED IMPLANT;  Surgeon: Hanley Ben, MD;  Location: [facility];  Service: Urology;  Laterality: N/A;  . REVERSE SHOULDER ARTHROPLASTY Right 05/21/2017  . REVERSE SHOULDER ARTHROPLASTY Right 05/21/2017  . REVERSE SHOULDER ARTHROPLASTY Right 05/21/2017   Procedure: REVERSE RIGHT SHOULDER ARTHROPLASTY;  Surgeon: Justice Britain, MD;  Location: [facility];  Service: Orthopedics;  Laterality: Right;  . RHINOPLASTY  1982   w/w/ septoplasty  . SHOULDER ARTHROSCOPY W/ ROTATOR CUFF REPAIR Right 2004  . SHOULDER ARTHROSCOPY W/ ROTATOR CUFF REPAIR Left 2016  . TONSILLECTOMY  AS CHILD   . TOTAL KNEE ARTHROPLASTY Right 09/24/2018   Procedure: TOTAL KNEE ARTHROPLASTY;  Surgeon: Sydnee Cabal, MD;  Location: [facility];  Service: Orthopedics;  Laterality: Right;  Adductor Block  . UVULOPALATOPHARYNGOPLASTY  2002    Family History  Problem Relation Age of Onset  . Cancer Mother        Pancreatic Cancer  . Heart disease Father 19       CABG    Social History   Socioeconomic History  . Marital status: Married    Spouse name: Not on file  . Number of children: Not on file  . Years of education: Not on file  . Highest education level: Not on file  Occupational History  . Occupation: Patent examiner  Social Needs  . Financial resource strain: Not on file  . Food insecurity:    Worry: Not on file    Inability: Not on file  . Transportation needs:    Medical: Not on file    Non-medical: Not on file  Tobacco Use  . Smoking status: Former Smoker    Packs/day: 1.00    Years: 20.00    Pack years: 20.00    Types: Cigarettes    Last attempt to quit: 08/11/1988    Years since quitting: 29.5  . Smokeless tobacco: Never Used  Substance and Sexual Activity  . Alcohol use: Yes    Comment: 05/21/2017 "1-2 beers/month"  . Drug use: No  . Sexual activity: Not Currently  Lifestyle  . Physical activity:    Days per week: Not on file    Minutes per session: Not on file  . Stress: Not on file  Relationships  . Social connections:    Talks on phone: Not on file    Gets together: Not on file    Attends religious service: Not on file    Active member of club or organization: Not on file    Attends meetings of clubs or organizations: Not on file    Relationship status: Not on file  . Intimate partner violence:    Fear of current or ex partner: Not on file    Emotionally abused: Not on file    Physically abused: Not on file    Forced sexual activity: Not on file  Other Topics Concern  . Not on file  Social History Narrative  . Not on file  Social History    Tobacco Use  Smoking Status Former Smoker  . Packs/day: 1.00  . Years: 20.00  . Pack years: 20.00  . Types: Cigarettes  . Quit date: 08/11/1978  . Years since quitting: 42.1  Smokeless Tobacco Never Used    Social History   Substance and Sexual Activity  Alcohol Use Yes   Comment: 05/21/2017 "1-2 beers/month"     Allergies  Allergen Reactions  . Codeine Nausea And Vomiting and Other (See Comments)    DIZZINESS  . Amoxicillin-Pot Clavulanate   . Ciprofloxacin Other (See Comments)    Unknown  . Quinolones     Patient was warned about not using quinolones. Recent studies have raised concern that fluoroquinolone antibiotics could be associated with an increased risk of aortic aneurysm Fluoroquinolones have non-antimicrobial properties that might jeopardise the integrity of the extracellular matrix of the vascular wallthere was a 66% increased rate of aortic aneurysm or dissection associated with oral fluoroquinolone use, compared with amoxicillin use, within a 60 day risk period from start of treatment       Current Outpatient Medications  Medication Sig Dispense Refill  . albuterol (PROVENTIL HFA;VENTOLIN HFA) 108 (90 BASE) MCG/ACT inhaler Inhale 2 puffs into the lungs every 6 (six) hours as needed for wheezing.    Marland Kitchen albuterol (VENTOLIN HFA) 108 (90 Base) MCG/ACT inhaler Inhale 2 puffs into the lungs every 6 (six) hours as needed for wheezing or shortness of breath. 18 g 2  . ARIPiprazole (ABILIFY) 2 MG tablet Take 1 tablet (2 mg total) by mouth daily. 90 tablet 1  . atorvastatin (LIPITOR) 40 MG tablet Take 1 tablet (40 mg total) by mouth every evening. 90 tablet 3  . benzonatate (TESSALON) 200 MG capsule Take 1 capsule (200 mg total) by mouth 2 (two) times daily as needed for cough. 60 capsule 1  . clonazePAM (KLONOPIN) 1 MG tablet TAKE ONE AND ONE-HALF TABLETS AT BEDTIME AS NEEDED FOR ANXIETY 135 tablet 0  . DULoxetine (CYMBALTA) 30 MG capsule 1 capsule daily for 1 week,  then 2 capsules daily for 1 week, then 3 capsules daily 90 capsule 1  . gabapentin (NEURONTIN) 800 MG tablet TAKE 1 TABLET(800 MG) BY MOUTH AT BEDTIME 90 tablet 1  . levothyroxine (LEVOXYL) 112 MCG tablet Take 1 tablet (112 mcg total) by mouth daily. 90 tablet 3  . Multiple Vitamin (MULTIVITAMIN WITH MINERALS) TABS tablet Take 1 tablet by mouth daily.    . Naftifine HCl 2 % CREA Apply thin layer to affected area once daily for 2-3 weeks. 45 g 0  . omeprazole (PRILOSEC) 40 MG capsule Take 1 capsule (40 mg total) by mouth in the morning and at bedtime. 90 capsule 3  . oxymetazoline (AFRIN) 0.05 % nasal spray Place 2 sprays into both nostrils 3 (three) times daily as needed for congestion.     . tamsulosin (FLOMAX) 0.4 MG CAPS capsule Take 1 capsule (0.4 mg total) by mouth daily. (Patient taking differently: Take 0.4 mg by mouth every evening.) 30 capsule 3  . venlafaxine XR (EFFEXOR XR) 75 MG 24 hr capsule 3 daily for 1 week, then 2 daily for 1 week, then 1 daily for 1 week, then stop 42 capsule 0  . Pirfenidone (ESBRIET) 801 MG TABS Take 801 mg by mouth in the morning, at noon, and at bedtime. (Patient not taking: Reported on 09/06/2020) 90 tablet 4  . Pirfenidone (ESBRIET) 801 MG TABS Take 1 tablet by mouth in the morning, at noon,  and at bedtime. (Patient not taking: Reported on 09/06/2020) 90 tablet 0   Current Facility-Administered Medications  Medication Dose Route Frequency Provider Last Rate Last Admin  . gabapentin (NEURONTIN) capsule 300 mg  300 mg Oral BID Mannam, Praveen, MD          Review of Systems:  Review of Systems  Constitutional: Negative.  Negative for chills, diaphoresis, fever, malaise/fatigue and weight loss.  HENT: Negative.   Eyes: Negative.   Respiratory: Positive for shortness of breath. Negative for cough, hemoptysis, sputum production and wheezing.   Cardiovascular: Negative.   Gastrointestinal: Positive for heartburn. Negative for abdominal pain, blood in stool,  diarrhea, melena, nausea and vomiting.  Genitourinary: Negative.   Musculoskeletal: Positive for joint pain. Negative for back pain, falls, myalgias and neck pain.  Skin: Negative.   Neurological: Negative.   Endo/Heme/Allergies: Negative.   Psychiatric/Behavioral: Negative.     Immunizations: Flu up to date Blue.Reese  ]; Pneumococcal up to date [ y ];   Physical Exam: BP 138/87 (BP Location: Left Arm, Patient Position: Sitting, Cuff Size: Normal)   Pulse 93   Temp 98.1 F (36.7 C) (Skin)   Resp 20   Ht 5\' 9"  (1.753 m)   Wt 188 lb (85.3 kg)   SpO2 93% Comment: RA  BMI 27.76 kg/m   PHYSICAL EXAMINATION: General appearance: alert, cooperative and no distress Head: Normocephalic, without obvious abnormality, atraumatic Neck: no adenopathy, no carotid bruit, no JVD, supple, symmetrical, trachea midline and thyroid not enlarged, symmetric, no tenderness/mass/nodules Lymph nodes: Cervical, supraclavicular, and axillary nodes normal. Resp: clear to auscultation bilaterally Cardio: regular rate and rhythm, S1, S2 normal, no murmur, click, rub or gallop GI: soft, non-tender; bowel sounds normal; no masses,  no organomegaly Extremities: extremities normal, atraumatic, no cyanosis or edema and Homans sign is negative, no sign of DVT Neurologic: Grossly normal PATIENT HAS NO STIGMATA OF MARFAN'S HAS PALPABLE PT AND DP PULSES BILATERALLY   Diagnostic Studies & Laboratory data:     Recent Radiology Findings: CLINICAL DATA:  Interstitial lung disease  EXAM: CT CHEST WITHOUT CONTRAST  TECHNIQUE: Multidetector CT imaging of the chest was performed following the standard protocol without intravenous contrast. High resolution imaging of the lungs, as well as inspiratory and expiratory imaging, was performed.  COMPARISON:  03/03/2019, 05/06/2018, 02/22/2018, 01/29/2017, 02/20/2012  FINDINGS: Cardiovascular: Unchanged enlargement of the tubular ascending thoracic aorta measuring 4.3 x  4.2 cm. Normal heart size. Left coronary artery calcifications. No pericardial effusion.  Mediastinum/Nodes: No enlarged mediastinal, hilar, or axillary lymph nodes. Thyroid gland, trachea, and esophagus demonstrate no significant findings.  Lungs/Pleura: Redemonstrated pattern of moderate pulmonary fibrosis with an apical to basal gradient featuring irregular peripheral interstitial opacity, septal thickening, areas of subpleural bronchiolectasis, and traction bronchiectasis. There are areas of probable honeycombing, for example in the anterior right middle lobe (series 5, image 89). Fibrotic findings appear somewhat worsened in comparison to prior examination dated 05/06/2018 and are significantly worsened over a longer period of time dating back to 02/20/2012. No significant air trapping on expiratory phase imaging. No pleural effusion or pneumothorax. Incidental note of a prominent anatomical variant azygos fissure.  Upper Abdomen: No acute abnormality.  Gallstones in the gallbladder.  Musculoskeletal: No chest wall mass or suspicious bone lesions identified.  IMPRESSION: 1. Redemonstrated pattern of moderate pulmonary fibrosis with an apical to basal gradient featuring irregular peripheral interstitial opacity, septal thickening, areas of subpleural bronchiolectasis, and traction bronchiectasis. Areas of probable honeycombing. Fibrotic findings appear somewhat worsened in  comparison to prior examination dated 05/06/2018 and are significantly worsened over a longer period of time dating back to 02/20/2012. Findings are consistent with an UIP pattern of pulmonary fibrosis by ATS criteria. Findings are consistent with UIP per consensus guidelines: Diagnosis of Idiopathic Pulmonary Fibrosis: An Official ATS/ERS/JRS/ALAT Clinical Practice Guideline. [place], Iss 5, [phone], Apr 11 2017. 2. Unchanged enlargement of the tubular ascending thoracic  aorta measuring 4.3 x 4.2 cm. Recommend annual imaging followup by CTA or MRA. This recommendation follows 2010 ACCF/AHA/AATS/ACR/ASA/SCA/SCAI/SIR/STS/SVM Guidelines for the Diagnosis and Management of Patients with Thoracic Aortic Disease. Circulation. 2010; [id number]. Aortic aneurysm NOS (ICD10-I71.9) 3. Coronary artery disease. 4. Cholelithiasis.   Electronically Signed   By: Eddie Candle M.D.   On: 05/02/2020 16:51  Ct Angio Chest Aorta W/cm &/or Wo/cm  Result Date: 02/22/2018 CLINICAL DATA:  Followup thoracic aortic aneurysm. EXAM: CT ANGIOGRAPHY CHEST WITH CONTRAST TECHNIQUE: Multidetector CT imaging of the chest was performed using the standard protocol during bolus administration of intravenous contrast. Multiplanar CT image reconstructions and MIPs were obtained to evaluate the vascular anatomy. CONTRAST:  79mL ISOVUE-370 IOPAMIDOL (ISOVUE-370) INJECTION 76% COMPARISON:  01/29/2017 FINDINGS: Cardiovascular: Normal. No pericardial effusion. Ascending thoracic aortic aneurysm measures 4.1 cm, image 75/4 and is unchanged from previous exam. The transverse aortic arch measures 2.7 cm, image 109/11. The posterior arch measures 2.3 cm, image 115/11. At the level of the hiatus the thoracic aorta measures 2.7 cm. Calcification noted within the LAD coronary artery. Mediastinum/Nodes: The trachea appears patent and is midline. Normal appearance of the esophagus. No enlarged mediastinal or hilar lymph nodes. Lungs/Pleura: No pleural effusions. Bilateral areas of interstitial reticulation and patchy ground-glass attenuation with traction bronchiectasis noted. This appears progressive when compared with 12/22/2015 and is suggestive of chronic interstitial lung disease. There is a 4 mm nodule within the right lower lobe and is unchanged from 12/22/2015. Upper Abdomen: No acute findings identified within the upper abdomen. Gallstones noted. Musculoskeletal: Scoliosis and spondylosis noted. No  aggressive lytic or sclerotic bone lesions. Review of the MIP images confirms the above findings. IMPRESSION: 1. Stable mild ascending thoracic aortic aneurysm. Recommend annual imaging followup by CTA or MRA. This recommendation follows 2010 ACCF/AHA/AATS/ACR/ASA/SCA/SCAI/SIR/STS/SVM Guidelines for the Diagnosis and Management of Patients with Thoracic Aortic Disease. Circulation. 2010; 121: [id number] 2. Persistent and progressive chronic interstitial changes within both lungs which may reflect early usual interstitial pneumonia (UIP) or nonspecific interstitial pneumonia. 3. Lad coronary artery atherosclerotic calcifications 4. Gallstones. Electronically Signed   By: Kerby Moors M.D.   On: 02/22/2018 15:17   Ct Angio Chest Aorta W/cm &/or Wo/cm  Result Date: 01/29/2017 CLINICAL DATA:  Thoracic aortic aneurysm without rupture. EXAM: CT ANGIOGRAPHY CHEST WITH CONTRAST TECHNIQUE: Multidetector CT imaging of the chest was performed using the standard protocol during bolus administration of intravenous contrast. Multiplanar CT image reconstructions and MIPs were obtained to evaluate the vascular anatomy. CONTRAST:  75 mL of Isovue 370 intravenously. COMPARISON:  CT scan of Dec 22, 2015. FINDINGS: Cardiovascular: 4.2 cm ascending thoracic aortic aneurysm is noted. No dissection is noted. Transverse aortic arch measures 2.7 cm. Proximal descending thoracic aorta measures 2.5 cm. No pericardial effusion is noted. Mediastinum/Nodes: No enlarged mediastinal, hilar, or axillary lymph nodes. Thyroid gland, trachea, and esophagus demonstrate no significant findings. Lungs/Pleura: No pneumothorax or pleural effusion is noted. Mild bronchiectasis is noted in both lower lobes with stable bibasilar peripheral scarring. No acute abnormality is noted. Upper Abdomen: Cholelithiasis is noted  without inflammation. Stable left renal cyst. Musculoskeletal: No chest wall abnormality. No acute or significant osseous findings. Review  of the MIP images confirms the above findings. IMPRESSION: Grossly stable 4.2 cm ascending thoracic aortic aneurysm. Recommend annual imaging followup by CTA or MRA. This recommendation follows 2010 ACCF/AHA/AATS/ACR/ASA/SCA/SCAI/SIR/STS/SVM Guidelines for the Diagnosis and Management of Patients with Thoracic Aortic Disease. Circulation. 2010; [id number]. Cholelithiasis without inflammation. Aortic aneurysm NOS (ICD10-I71.9). Electronically Signed   By: Marijo Conception, M.D.   On: 01/29/2017 14:07      Ct Chest W Contrast  12/22/2015  CLINICAL DATA:  75 year old male with fall EXAM: CT CHEST, ABDOMEN, AND PELVIS WITH CONTRAST TECHNIQUE: Multidetector CT imaging of the chest, abdomen and pelvis was performed following the standard protocol during bolus administration of intravenous contrast. CONTRAST:  176mL ISOVUE-300 IOPAMIDOL (ISOVUE-300) INJECTION 61% COMPARISON:  Abdominal MRI dated 03/02/2011 FINDINGS: CT CHEST There is mild diffuse interstitial coarsening with areas of atelectasis/ scarring at the lung bases. There is no focal consolidation, pleural effusion, or pneumothorax. An accessory azygos fissure noted. The central airways are patent. There is mild dilatation of the ascending aorta measuring 4.3 cm in diameter. The thoracic aorta is otherwise unremarkable. No dissection or traumatic injury. The origins of the great vessels of the aortic arch appear patent. The central pulmonary arteries appear patent. There is no cardiomegaly or pericardial effusion. There is coronary vascular calcification. There is no hilar or mediastinal adenopathy. The esophagus is grossly unremarkable. The thyroid nodule is not visualized. There is no axillary adenopathy. The chest wall soft tissues appear unremarkable. There is osteopenia. There is minimally displaced fracture of the left posterior ninth rib. Nondisplaced fractures of the left posterior tenth and eleventh ribs noted. Right shoulder rotator cuff surgical  pinning noted. CT ABDOMEN AND PELVIS No intra-abdominal free air or free fluid. There stone within the gallbladder. No pericholecystic fluid. Ultrasound may provide better evaluation of the gallbladder if clinically indicated. The liver, pancreas, spleen, and the adrenal glands appear unremarkable. Multiple left renal hypodense lesions measuring up to 4.2 cm in the upper pole of the left kidney. The larger lesions measure cyst. The smaller lesions are too small to characterize but however seen on the prior MRI and described as cysts. The right kidney is unremarkable. There is no hydronephrosis on either side. The visualized ureters and urinary bladder appear unremarkable. Prostate brachytherapy seeds noted. There is sigmoid diverticulosis without acute inflammatory changes. Constipation. There is no evidence of bowel obstruction or active inflammation. Normal appendix. The abdominal aorta and IVC appear unremarkable. No portal venous gas identified. There is no adenopathy. The abdominal wall soft tissues appear unremarkable. There is degenerative changes of the spine. Old healed left pubic bone fracture deformity. No acute fracture. IMPRESSION: Fractures of the left posterior 9-11th ribs. No pneumothorax. No other acute/traumatic intrathoracic, abdominal, or pelvic pathology identified. A 4.3 cm dilatation of the ascending aorta. Recommend annual imaging followup by CTA or MRA. This recommendation follows 2010 ACCF/AHA/AATS/ACR/ASA/SCA/SCAI/SIR/STS/SVM Guidelines for the Diagnosis and Management of Patients with Thoracic Aortic Disease. Circulation. 2010; [id number] Electronically Signed   By: Anner Crete M.D.   On: 12/22/2015 22:39     Ct Cervical Spine Wo Contrast  12/22/2015  CLINICAL DATA:  Fall today.  Neck Pain EXAM: CT CERVICAL SPINE WITHOUT CONTRAST TECHNIQUE: Multidetector CT imaging of the cervical spine was performed without intravenous contrast. Multiplanar CT image reconstructions were  also generated. COMPARISON:  None. FINDINGS: Disc degeneration and spondylosis C4-5 and C5-6. 2  mm anterior slip C7-T1 due to advanced facet degeneration on the left. Right-sided facet degeneration C2-3 and C3-4. Negative for cervical spine fracture. No mass or adenopathy in the neck. Lung apices clear. IMPRESSION: Cervical spine degenerative change.  No acute fracture. Electronically Signed   By: Franchot Gallo M.D.   On: 12/22/2015 22:13   Ct Abdomen Pelvis W Contrast  12/22/2015  CLINICAL DATA:  75 year old male with fall EXAM: CT CHEST, ABDOMEN, AND PELVIS WITH CONTRAST TECHNIQUE: Multidetector CT imaging of the chest, abdomen and pelvis was performed following the standard protocol during bolus administration of intravenous contrast. CONTRAST:  180mL ISOVUE-300 IOPAMIDOL (ISOVUE-300) INJECTION 61% COMPARISON:  Abdominal MRI dated 03/02/2011 FINDINGS: CT CHEST There is mild diffuse interstitial coarsening with areas of atelectasis/ scarring at the lung bases. There is no focal consolidation, pleural effusion, or pneumothorax. An accessory azygos fissure noted. The central airways are patent. There is mild dilatation of the ascending aorta measuring 4.3 cm in diameter. The thoracic aorta is otherwise unremarkable. No dissection or traumatic injury. The origins of the great vessels of the aortic arch appear patent. The central pulmonary arteries appear patent. There is no cardiomegaly or pericardial effusion. There is coronary vascular calcification. There is no hilar or mediastinal adenopathy. The esophagus is grossly unremarkable. The thyroid nodule is not visualized. There is no axillary adenopathy. The chest wall soft tissues appear unremarkable. There is osteopenia. There is minimally displaced fracture of the left posterior ninth rib. Nondisplaced fractures of the left posterior tenth and eleventh ribs noted. Right shoulder rotator cuff surgical pinning noted. CT ABDOMEN AND PELVIS No intra-abdominal free  air or free fluid. There stone within the gallbladder. No pericholecystic fluid. Ultrasound may provide better evaluation of the gallbladder if clinically indicated. The liver, pancreas, spleen, and the adrenal glands appear unremarkable. Multiple left renal hypodense lesions measuring up to 4.2 cm in the upper pole of the left kidney. The larger lesions measure cyst. The smaller lesions are too small to characterize but however seen on the prior MRI and described as cysts. The right kidney is unremarkable. There is no hydronephrosis on either side. The visualized ureters and urinary bladder appear unremarkable. Prostate brachytherapy seeds noted. There is sigmoid diverticulosis without acute inflammatory changes. Constipation. There is no evidence of bowel obstruction or active inflammation. Normal appendix. The abdominal aorta and IVC appear unremarkable. No portal venous gas identified. There is no adenopathy. The abdominal wall soft tissues appear unremarkable. There is degenerative changes of the spine. Old healed left pubic bone fracture deformity. No acute fracture. IMPRESSION: Fractures of the left posterior 9-11th ribs. No pneumothorax. No other acute/traumatic intrathoracic, abdominal, or pelvic pathology identified. A 4.3 cm dilatation of the ascending aorta. Recommend annual imaging followup by CTA or MRA. This recommendation follows 2010 ACCF/AHA/AATS/ACR/ASA/SCA/SCAI/SIR/STS/SVM Guidelines for the Diagnosis and Management of Patients with Thoracic Aortic Disease. Circulation. 2010; [id number] Electronically Signed   By: Anner Crete M.D.   On: 12/22/2015 22:39   Stress test: Study Highlights    Nuclear stress EF: 57%.  There was no ST segment deviation noted during stress.  The study is normal.  This is a low risk study.  The left ventricular ejection fraction is normal (55-65%).   Normal resting and stress perfusion. No ischemia or infarction EF 57%   Nuclear History and  Indications  History and Indications Indication for Stress Test:  Diagnosis of coronary disease and surgery clearance History:  No known CAD Cardiac Risk Factors: Family History - CAD,  Hypertension and Lipids  Symptoms:  DOE and Light-Headedness  Stress Findings  ECG Baseline ECG exhibits normal sinus rhythm..  Stress Findings A pharmacological stress test was performed using IV Lexiscan 0.4mg  over 10 seconds performed without concurrent submaximal exercise.   The patient reported shortness of breath and flushing during the stress test. The patient experienced no angina during the stress test.   Test was stopped per protocol.    Recovery time:  5 minutes.  Response to Stress There was no ST segment deviation noted during stress.  Arrhythmias during stress:  none.   Arrhythmias during recovery:  none.     There were no significant arrhythmias noted during the test.   ECG was uninterpretable.  Stress Measurements  Baseline Vitals  Rest HR 65 bpm    Rest BP 129/83 mmHg    Peak Stress Vitals  Peak HR 90 bpm    Peak BP 122/70 mmHg       Nuclear Stress Measurements  LV sys vol 36 mL    TID 1.06     LV dias vol 84 mL    SSS 0     SRS 0     SDS 0          Nuclear Stress Findings  Isotope administration Rest isotope was administered  with an IV injection of 10.7 mCi Tc83m Tetrofosmin.  Rest SPECT images were obtained approximately 45 minutes post tracer injection.  Stress isotope was administered  with an IV injection of 31.8 mCi [id number] Tetrofosmin   Stress SPECT images were obtained approximately 60 minutes post tracer injection.  Nuclear Study Quality Overall image quality is good.  Nuclear Measurements Study was gated.  Perfusion Summary Normal resting and stress perfusion. No ischemia or infarction EF 57%  Overall Study Impression Myocardial perfusion is normal.   The study is normal.   This is a low risk study.  Overall left ventricular systolic function was normal.     LV cavity size is normal.  Nuclear stress EF:  57%.  The left ventricular ejection fraction is normal (55-65%).   From: ACCF/SCAI/STS/AATS/AHA/ASNC/HFSA/SCCT 2012 Appropriate Use Criteria for Coronary Revascularization Focused Update  Wall Scoring  Score Index: 1.000 Percent Normal: 100.0%           The left ventricular wall motion is normal.          Resulted by:  Signed Date/Time  Phone Pager  Josue Hector 08/18/2018 3:22 PM [phone]            echo: *[facility]*                         [address], [place] [address]                            [phone]  ------------------------------------------------------------------- Echocardiography  Patient:    Daryn, Mccaughey MR #:       [id number] Study Date: 01/30/2016 Gender:     M Age:        34 Height:     177.8 cm Weight:     83.9 kg BSA:  2.05 m^2 Pt. Status: Room:   ATTENDING    Lanelle Bal MD  [place] MD  [place] MD  SONOGRAPHER  [place] Out[facility]  cc:  ------------------------------------------------------------------- LV EF: 60% -   65%  ------------------------------------------------------------------- History:   PMH:  Former Smoker, GERD. Preoperative Clearance.  Risk factors:  Hypertension. Dyslipidemia.  ------------------------------------------------------------------- Study Conclusions  - Left ventricle: The cavity size was normal. There was moderate   concentric hypertrophy. Systolic function was normal. The   estimated ejection fraction was in the range of 60% to 65%. Wall   motion was normal; there were no regional wall motion   abnormalities. Doppler parameters are consistent with abnormal   left ventricular relaxation (grade 1 diastolic dysfunction).   Doppler parameters are consistent with  indeterminate ventricular   filling pressure. - Aortic valve: Transvalvular velocity was within the normal range.   There was no stenosis. There was mild regurgitation.   Regurgitation pressure half-time: 586 ms. - Mitral valve: Transvalvular velocity was within the normal range.   There was no evidence for stenosis. There was no regurgitation. - Right ventricle: The cavity size was normal. Wall thickness was   normal. Systolic function was normal. - Tricuspid valve: There was mild regurgitation. - Pulmonary arteries: Systolic pressure was within the normal   range. PA peak pressure: 29 mm Hg (S).  Echocardiography.  M-mode, complete 2D, spectral Doppler, and color Doppler.  Birthdate:  Patient birthdate: Aug 26, 1945.  Age:  Patient is 75 yr old.  Sex:  Gender: male.    BMI: 26.5 kg/m^2.  Patient status:  Outpatient.  Study date:  Study date: 01/30/2016. Study time: 08:45 AM.  Location:  Echo laboratory.  -------------------------------------------------------------------  ------------------------------------------------------------------- Left ventricle:  The cavity size was normal. There was moderate concentric hypertrophy. Systolic function was normal. The estimated ejection fraction was in the range of 60% to 65%. Wall motion was normal; there were no regional wall motion abnormalities. Doppler parameters are consistent with abnormal left ventricular relaxation (grade 1 diastolic dysfunction). Doppler parameters are consistent with indeterminate ventricular filling pressure.  ------------------------------------------------------------------- Aortic valve:   Trileaflet; normal thickness leaflets. Mobility was not restricted.  Doppler:  Transvalvular velocity was within the normal range. There was no stenosis. There was mild regurgitation.   ------------------------------------------------------------------- Aorta:  Aortic root: The aortic root was normal in size. Ascending  aorta: The ascending aorta was mildly dilated.  ------------------------------------------------------------------- Mitral valve:   Structurally normal valve.   Mobility was not restricted.  Doppler:  Transvalvular velocity was within the normal range. There was no evidence for stenosis. There was no regurgitation.  ------------------------------------------------------------------- Left atrium:  The atrium was normal in size.  ------------------------------------------------------------------- Right ventricle:  The cavity size was normal. Wall thickness was normal. Systolic function was normal.  ------------------------------------------------------------------- Pulmonic valve:    Structurally normal valve.   Cusp separation was normal.  Doppler:  Transvalvular velocity was within the normal range. There was no evidence for stenosis. There was trivial regurgitation.  ------------------------------------------------------------------- Tricuspid valve:   Structurally normal valve.    Doppler: Transvalvular velocity was within the normal range. There was mild regurgitation.  ------------------------------------------------------------------- Pulmonary artery:   The main pulmonary artery was normal-sized. Systolic pressure was within the normal range.  ------------------------------------------------------------------- Right atrium:  The atrium was normal in size.  ------------------------------------------------------------------- Pericardium:  There was no pericardial effusion.  ------------------------------------------------------------------- Systemic veins: Inferior vena cava: The vessel was normal in size.  The respirophasic diameter changes were in the normal range (>= 50%), consistent with normal central venous pressure.  ------------------------------------------------------------------- Measurements   Left ventricle                           Value         Reference  LV ID, ED, PLAX chordal          (L)     34.4  mm     43 - 52  LV ID, ES, PLAX chordal          (L)     22.3  mm     23 - 38  LV fx shortening, PLAX chordal           35    %      >=29  LV PW thickness, ED                      15.4  mm     ---------  IVS/LV PW ratio, ED                      1.03         <=1.3  LV e&', lateral                           7.94  cm/s   ---------  LV E/e&', lateral                         5.5          ---------  LV e&', medial                            4.35  cm/s   ---------  LV E/e&', medial                          10.05        ---------  LV e&', average                           6.15  cm/s   ---------  LV E/e&', average                         7.11         ---------    Ventricular septum                       Value        Reference  IVS thickness, ED                        15.9  mm     ---------    LVOT                                     Value        Reference  LVOT ID, S                               20    mm     ---------  LVOT area                                3.14  cm^2   ---------    Aortic valve                             Value        Reference  Aortic regurg pressure half-time         586   ms     ---------    Aorta                                    Value        Reference  Aortic root ID, ED                       40    mm     ---------  Ascending aorta ID, A-P                  40.7  mm     ---------    Left atrium                              Value        Reference  LA ID, A-P, ES                           28    mm     ---------  LA ID/bsa, A-P                           1.37  cm/m^2 <=2.2  LA volume, ES, 1-p A4C                   37.7  ml     ---------  LA volume/bsa, ES, 1-p A4C               18.4  ml/m^2 ---------  LA volume, ES, 1-p A2C                   38.8  ml     ---------  LA volume/bsa, ES, 1-p A2C               18.9  ml/m^2 ---------    Mitral valve                             Value        Reference  Mitral E-wave  peak velocity              43.7  cm/s   ---------  Mitral A-wave peak velocity              72.8  cm/s   ---------  Mitral deceleration time         (H)     356   ms     150 - 230  Mitral E/A ratio, peak                   0.6          ---------  Pulmonary arteries                       Value        Reference  PA pressure, S, DP                       29    mm Hg  <=30    Tricuspid valve                          Value        Reference  Tricuspid regurg peak velocity           254   cm/s   ---------  Tricuspid peak RV-RA gradient            26    mm Hg  ---------    Systemic veins                           Value        Reference  Estimated CVP                            3     mm Hg  ---------    Right ventricle                          Value        Reference  TAPSE                                    19.1  mm     ---------  RV pressure, S, DP                       29    mm Hg  <=30    Pulmonic valve                           Value        Reference  Pulmonic regurg velocity, ED             124   cm/s   ---------  Pulmonic regurg gradient, ED             6     mm Hg  ---------  Legend: (L)  and  (H)  mark values outside specified reference range.  ------------------------------------------------------------------- Prepared and Electronically Authenticated by  Skeet Latch, MD 2017-06-21T09:59:22       Recent Lab Findings: Lab Results  Component Value Date   WBC 9.5 05/25/2020   HGB 15.0 05/25/2020   HCT 44.4 05/25/2020   PLT 309 05/25/2020   GLUCOSE 102 (H) 08/14/2020   CHOL 163 05/25/2020   TRIG 207 (H) 05/25/2020   HDL 63 05/25/2020   LDLDIRECT 73.0 05/03/2019   LDLCALC 70 05/25/2020   ALT 20 08/14/2020   AST 24 08/14/2020   NA 136 08/14/2020   K 4.3 08/14/2020   CL 99 08/14/2020   CREATININE 0.90 08/14/2020   BUN 14 08/14/2020   CO2 30 08/14/2020   TSH 2.82 05/25/2020   INR 0.89 09/22/2018   HGBA1C 6.0 (H) 05/25/2020   Aortic Size Index=    4.2      /  Body surface area is 2.04 meters squared. = 2.1  < 2.75 cm/m2      4% risk per year 2.75 to 4.25          8% risk per year > 4.25 cm/m2    20% risk per year     Assessment / Plan:  #1 stable 4.2 cm dilatation of the ascending aorta, with a trileaflet aortic valve by echo without sclerosis or stenosis #2. Persistent and progressive chronic interstitial changes within both lungs which may reflect early usual interstitial pneumonia (UIP) or nonspecific interstitial pneumonia. -patient followed in pulmonary office- notes has  not been able to tolerated current pulmonary meds and will discuss on his next appointment with pulmonary  #3 lad coronary artery atherosclerotic calcifications by CT scan-without signs or symptoms of angina , the patient does note occasional episodes of "indigestion at rest" should the symptoms change she will contact Dr. Irish Lack his cardiologist  #4 gallstones-by CT scan asymptomatic   follow-up CTA of the chest  Consider in one year if does not have one for other reasons. Currently followed by pulmonary and cardiology - have not made further follow up in surgery office- cardiology can refer back as needed  Grace Isaac MD      [address] [place] [address] Office [phone]   Beeper [phone]  09/06/2020 1:59 PM

## 2020-09-06 ENCOUNTER — Ambulatory Visit (INDEPENDENT_AMBULATORY_CARE_PROVIDER_SITE_OTHER): Payer: Medicare Other | Admitting: Cardiothoracic Surgery

## 2020-09-06 ENCOUNTER — Encounter: Payer: Self-pay | Admitting: Cardiothoracic Surgery

## 2020-09-06 ENCOUNTER — Other Ambulatory Visit: Payer: Self-pay

## 2020-09-06 ENCOUNTER — Other Ambulatory Visit: Payer: Medicare Other

## 2020-09-06 VITALS — BP 138/87 | HR 93 | Temp 98.1°F | Resp 20 | Ht 69.0 in | Wt 188.0 lb

## 2020-09-06 DIAGNOSIS — I712 Thoracic aortic aneurysm, without rupture, unspecified: Secondary | ICD-10-CM

## 2020-09-17 ENCOUNTER — Other Ambulatory Visit: Payer: Self-pay

## 2020-09-17 ENCOUNTER — Ambulatory Visit (INDEPENDENT_AMBULATORY_CARE_PROVIDER_SITE_OTHER): Payer: Medicare Other | Admitting: Psychiatry

## 2020-09-17 ENCOUNTER — Encounter: Payer: Self-pay | Admitting: Psychiatry

## 2020-09-17 DIAGNOSIS — F5105 Insomnia due to other mental disorder: Secondary | ICD-10-CM

## 2020-09-17 DIAGNOSIS — R251 Tremor, unspecified: Secondary | ICD-10-CM | POA: Diagnosis not present

## 2020-09-17 DIAGNOSIS — F3342 Major depressive disorder, recurrent, in full remission: Secondary | ICD-10-CM

## 2020-09-17 DIAGNOSIS — F411 Generalized anxiety disorder: Secondary | ICD-10-CM | POA: Diagnosis not present

## 2020-09-17 DIAGNOSIS — F331 Major depressive disorder, recurrent, moderate: Secondary | ICD-10-CM

## 2020-09-17 MED ORDER — ARIPIPRAZOLE 2 MG PO TABS: 2.0000 mg | ORAL_TABLET | Freq: Every day | ORAL | 1 refills | Status: DC

## 2020-09-17 MED ORDER — CLONAZEPAM 1 MG PO TABS
ORAL_TABLET | ORAL | 1 refills | Status: DC
Start: 2020-09-17 — End: 2021-03-20

## 2020-09-17 NOTE — Progress Notes (Signed)
Bryan Tyler [id number] February 19, 1946 75 y.o.  Subjective:   Patient ID:  Bryan Tyler is a 75 y.o. (DOB 22-Jun-1946) male.  Chief Complaint:  Chief Complaint  Patient presents with  . Medication Problem  . Major depressive disorder, recurrent episode, moderate ([place])  . Follow-up  . Anxiety  . Sleeping Problem    HPI Bryan Tyler presents to the office today for follow-up of history of major depression and anxiety and insomnia.  seen June 2020.  The only medication change was to reduce Abilify from 5 mg to 2 mg daily to see if tremor would improve.  He had residual anxiety but not much depression at the time.  Patient called April 22, 2019 asking for refill of clonazepam early indicating he had increased the dosages on his own to 1.5 mg instead of 1 mg.  He was informed he could not change the dosage of a controlled substance without our permission.  seen December 2020.  The following was noted: Tremor resolved with reduction in Abilify.  Wants to go through meds and their purpose. No history of RLS.   Ran out of Klonopin lately and only sleeping 3-4  Hours.  Usually sleeps oK with 1 mg nightly. Overall anxiety and depression and sleep are managed.  Because of polypharmacy we elected to try to discontinue buspirone and he was to let us know if he had any rebound anxiety.  December 07, 2019 appointment the following is noted: More trouble with sleep and Klonopin 1 mg no longer getting him to sleep and will take another 1/2 tablet.  Then runs out of the Klonopin early. No SE from 1.5 mg Klonopin. Sleep 7 hours nightly.  45 nap in day.   2 cups coffee AM, not daily other caffeine but sometimes tea with dinner.   Work 3 days week. Plan: No med changes  06/05/2020 appointment with the following noted: Rough physically, pneumonia July and then dx pulm fibrosis.  Feels not well.  No Covid.  Not smoker. Stayed on Effexor XR 300, Abilify 2 mg  And clonazepam 1.0-1.5 mg  HS. Plan: He wonders about med change to try to help with the recurrent depression.  His depression had been in remission on Abilify 5 mg and then 2 mg daily. The most logical alternative would be switching from Abilify to Rexulti 1 mg for 1 week and then if no response increase to 2 mg.  He had tremors on 5 mg of Abilify so going up and Abilify is not a good option.  08/01/2020 appointment with the following noted: He called since being here indicating the [place] was making him too tired.  He was offered the option of reducing the dose or returning to Abilify. He thinks he returned to Abilify but not certain. Function OK but thinks he's still depressed.    Pulmonary fibrosis dx and RX Esbriet and it makes him feel out of it. Plan: Reduce Venlafaxine to 3 of the 75 mg capsules and add 30 mg duloxetine for 1 week, Then reduce venlafaxine to 2 of the 75 mg capsules and increase duloxetine to 2 capsules daily for 1 week, Then reduce venlafaxine to one of the 75 mg capsules and increase duloxetine to 3 capsules daily for 1 week, Then stop venlafaxine and continue 3 duloxetine daily  09/17/2020 appointment urgently scheduled by the patient: Got confused and didn't make the change to duloxetine.  Didn't know if he was supposed to take both at the same time.  Had brain fog from new med for pulmonary fibrosis [place] and that compounded difficulty understand what to do and stopped that med too.    He doesn't want to take a change at this time.  Not necessary at this time.  Depression is worse in the morning but manageable.  Anxiety is worse than last visit. worse at night lying down when not distracted.   Pt reports couldn't go to sleep before clonazepam and when ran out. Has run out of it at times and can't sleep.  Pt reports that appetite is good. Pt reports that energy is less good.  Concentration is better than last visit. Suicidal thoughts:  denied by patient.  Past Psychiatric Medication Trials:  Effexor 300, Abilify 5 started 2017,  Rexulti 2 mg tired  Buspirone 15 BID, Clonazepam 1 &1/2 mg nightly Gabapentin 800 nightly Belsomra Lorazepam Mirtazapine 30 Trazodone 150  Review of Systems:  Review of Systems  Respiratory: Positive for shortness of breath.   Cardiovascular: Negative for chest pain and palpitations.       Enlarged aorta with follow-up next month.  Neurological: Negative for dizziness, tremors and weakness.  Neuro said no Parkinson's dz.  Medications: I have reviewed the patient's current medications.  Current Outpatient Medications  Medication Sig Dispense Refill  . atorvastatin (LIPITOR) 40 MG tablet Take 1 tablet (40 mg total) by mouth every evening. 90 tablet 3  . gabapentin (NEURONTIN) 800 MG tablet TAKE 1 TABLET(800 MG) BY MOUTH AT BEDTIME 90 tablet 1  . Multiple Vitamin (MULTIVITAMIN WITH MINERALS) TABS tablet Take 1 tablet by mouth daily.    . Naftifine HCl 2 % CREA Apply thin layer to affected area once daily for 2-3 weeks. (Patient taking differently: as needed. Apply thin layer to affected area once daily for 2-3 weeks.) 45 g 0  . omeprazole (PRILOSEC) 40 MG capsule Take 1 capsule (40 mg total) by mouth in the morning and at bedtime. 90 capsule 3  . oxymetazoline (AFRIN) 0.05 % nasal spray Place 2 sprays into both nostrils 3 (three) times daily as needed for congestion.     . tamsulosin (FLOMAX) 0.4 MG CAPS capsule Take 1 capsule (0.4 mg total) by mouth daily. (Patient taking differently: Take 0.4 mg by mouth every evening.) 30 capsule 3  . venlafaxine XR (EFFEXOR XR) 75 MG 24 hr capsule 3 daily for 1 week, then 2 daily for 1 week, then 1 daily for 1 week, then stop (Patient taking differently: Take by mouth daily with breakfast. 3 daily for 1 week, then 2 daily for 1 week, then 1 daily for 1 week, then stop) 42 capsule 0  . venlafaxine XR (EFFEXOR-XR) 150 MG 24 hr capsule Take 150 mg by mouth. Take 2 capsules daily with breakfast.    . albuterol  (PROVENTIL HFA;VENTOLIN HFA) 108 (90 BASE) MCG/ACT inhaler Inhale 2 puffs into the lungs every 6 (six) hours as needed for wheezing. (Patient not taking: Reported on 09/17/2020)    . albuterol (VENTOLIN HFA) 108 (90 Base) MCG/ACT inhaler Inhale 2 puffs into the lungs every 6 (six) hours as needed for wheezing or shortness of breath. (Patient not taking: Reported on 09/17/2020) 18 g 2  . ARIPiprazole (ABILIFY) 2 MG tablet Take 1 tablet (2 mg total) by mouth daily. 90 tablet 1  . benzonatate (TESSALON) 200 MG capsule Take 1 capsule (200 mg total) by mouth 2 (two) times daily as needed for cough. (Patient not taking: Reported on 09/17/2020) 60 capsule 1  . clonazePAM (  KLONOPIN) 1 MG tablet TAKE ONE AND ONE-HALF TABLETS AT BEDTIME AS NEEDED FOR ANXIETY 135 tablet 1  . levothyroxine (LEVOXYL) 112 MCG tablet Take 1 tablet (112 mcg total) by mouth daily. 90 tablet 3  . Pirfenidone (ESBRIET) 801 MG TABS Take 801 mg by mouth in the morning, at noon, and at bedtime. (Patient not taking: No sig reported) 90 tablet 4   Current Facility-Administered Medications  Medication Dose Route Frequency Provider Last Rate Last Admin  . gabapentin (NEURONTIN) capsule 300 mg  300 mg Oral BID Mannam, Praveen, MD        Medication Side Effects: Other: brief dizzy when stands up  Allergies:  Allergies  Allergen Reactions  . Codeine Nausea And Vomiting and Other (See Comments)    DIZZINESS  . Amoxicillin-Pot Clavulanate   . Ciprofloxacin Other (See Comments)    Unknown  . Quinolones     Patient was warned about not using quinolones. Recent studies have raised concern that fluoroquinolone antibiotics could be associated with an increased risk of aortic aneurysm Fluoroquinolones have non-antimicrobial properties that might jeopardise the integrity of the extracellular matrix of the vascular wallthere was a 66% increased rate of aortic aneurysm or dissection associated with oral fluoroquinolone use, compared with amoxicillin  use, within a 60 day risk period from start of treatment       Past Medical History:  Diagnosis Date  . Adenocarcinoma of prostate ([place]) 04/16/2013  . Aneurysm, aorta, thoracic ([place])   . Anxiety   . Arthritis    "knees" (05/21/2017)  . Depression   . Diverticulosis   . Dyspnea    on exertion for a long period time  . GERD (gastroesophageal reflux disease)   . Hyperlipidemia   . Hypertension   . Hypothyroidism, postradioiodine therapy    1980's  . Nocturia   . Organic impotence   . OSA (obstructive sleep apnea)    NO CPAP--  S/P SURGERY  2002  . Osteoporosis   . Pneumonia 1970  . PONV (postoperative nausea and vomiting)   . Urge urinary incontinence   . Wears contact lenses     Family History  Problem Relation Age of Onset  . Cancer Mother        Pancreatic Cancer  . Heart disease Father 75       CABG    Social History   Socioeconomic History  . Marital status: Married    Spouse name: Not on file  . Number of children: Not on file  . Years of education: Not on file  . Highest education level: Not on file  Occupational History  . Occupation: Patent examiner  Tobacco Use  . Smoking status: Former Smoker    Packs/day: 1.00    Years: 20.00    Pack years: 20.00    Types: Cigarettes    Quit date: 08/11/1978    Years since quitting: 42.1  . Smokeless tobacco: Never Used  Vaping Use  . Vaping Use: Never used  Substance and Sexual Activity  . Alcohol use: Yes    Comment: 05/21/2017 "1-2 beers/month"  . Drug use: No  . Sexual activity: Not Currently  Other Topics Concern  . Not on file  Social History Narrative  . Not on file   Social Determinants of Health   Financial Resource Strain: Not on file  Food Insecurity: Not on file  Transportation Needs: Not on file  Physical Activity: Not on file  Stress: Not on file  Social Connections: Not  on file  Intimate Partner Violence: Not on file    Past Medical History, Surgical history, Social history,  and Family history were reviewed and updated as appropriate.   Please see review of systems for further details on the patient's review from today.   Objective:   Physical Exam:  There were no vitals taken for this visit.  Physical Exam Constitutional:      General: He is not in acute distress.    Appearance: He is well-developed.  Musculoskeletal:        General: No deformity.  Neurological:     Mental Status: He is alert and oriented to person, place, and time.     Motor: Tremor present.     Coordination: Coordination normal.     Comments: Tremor minimal  Psychiatric:        Attention and Perception: Attention and perception normal. He does not perceive auditory or visual hallucinations.        Mood and Affect: Mood is anxious and depressed. Affect is not labile, blunt, angry, tearful or inappropriate.        Speech: Speech normal.        Behavior: Behavior normal.        Thought Content: Thought content normal. Thought content does not include homicidal or suicidal ideation. Thought content does not include homicidal or suicidal plan.        Cognition and Memory: Cognition and memory normal.        Judgment: Judgment normal.     Comments: Insight intact. No delusions.  Residual managed anxiety and depression.     Lab Review:     Component Value Date/Time   NA 136 08/14/2020 1238   NA 137 11/16/2019 1001   K 4.3 08/14/2020 1238   CL 99 08/14/2020 1238   CO2 30 08/14/2020 1238   GLUCOSE 102 (H) 08/14/2020 1238   BUN 14 08/14/2020 1238   BUN 12 11/16/2019 1001   CREATININE 0.90 08/14/2020 1238   CREATININE 0.86 05/25/2020 0959   CALCIUM 9.4 08/14/2020 1238   CALCIUM 9.8 07/09/2011 0819   PROT 7.5 08/14/2020 1238   ALBUMIN 4.6 08/14/2020 1238   AST 24 08/14/2020 1238   ALT 20 08/14/2020 1238   ALKPHOS 68 08/14/2020 1238   BILITOT 0.5 08/14/2020 1238   GFRNONAA 85 11/16/2019 1001   GFRAA 98 11/16/2019 1001       Component Value Date/Time   WBC 9.5 05/25/2020  0959   RBC 4.85 05/25/2020 0959   HGB 15.0 05/25/2020 0959   HCT 44.4 05/25/2020 0959   PLT 309 05/25/2020 0959   MCV 91.5 05/25/2020 0959   MCH 30.9 05/25/2020 0959   MCHC 33.8 05/25/2020 0959   RDW 12.9 05/25/2020 0959   LYMPHSABS 2.1 01/02/2020 1608   MONOABS 0.9 01/02/2020 1608   EOSABS 0.3 01/02/2020 1608   BASOSABS 0.0 01/02/2020 1608    No results found for: POCLITH, LITHIUM   No results found for: PHENYTOIN, PHENOBARB, VALPROATE, CBMZ   .res Assessment: Plan:    Bryan Tyler was seen today for medication problem, major depressive disorder, recurrent episode, moderate (hcc), follow-up, anxiety and sleeping problem.  Diagnoses and all orders for this visit:  Major depressive disorder, recurrent episode, moderate (HCC)  Generalized anxiety disorder  Insomnia due to mental condition -     clonazePAM (KLONOPIN) 1 MG tablet; TAKE ONE AND ONE-HALF TABLETS AT BEDTIME AS NEEDED FOR ANXIETY  Tremor of unknown origin  Recurrent major depressive disorder, in full remission (  [place]) -     ARIPiprazole (ABILIFY) 2 MG tablet; Take 1 tablet (2 mg total) by mouth daily.    Greater than 50% of 30 min face to face time with patient was spent on counseling and coordination of care. We discussed that Bryan Tyler has a long history of depression and anxiety the has been fairly well controlled in recent years.  He is on the maximum dosage of venlafaxine and when he had a recurrence of depression in 2017, aripiprazole 2 mg was added.   His depression had been in remission on Abilify 5 mg and then 2 mg daily.  He has changed his mind and does not want to change from Effexor right now.  Brought meds and verified dosages.  Option change Effexor to duloxetine or Trintellix.   Disc SE in detail and SSRI withdrawal sx. Option buspirone.  Extensive discussion of sleep hygiene.  No caffeine after 3 pm.    Defer weaning gabapentin bc residual significant anxiety.  We discussed the short-term risks  associated with benzodiazepines including sedation and increased fall risk among others.  Discussed long-term side effect risk including dependence, potential withdrawal symptoms, and the potential eventual dose-related risk of dementia.  Also disc risk affecting balance and memory.  No higher dose than 1.5 mg but that dose will be allowed since failed other sleepers and tolerating it. He does not feel like he can do with any less clonazepam.  Cautioned again against exceeding dosage of clonazepam without my OK.  Supportive therapy in dealing with his new diagnosis of pulmonary fibrosis.  Encouraged him to take it 1 step at a time and see what pulmonology can offer.  They have suggested a med trial which is in process..  This appointment was 30 minutes  Follow-up 2-3 mos  Lynder Parents, MD, DFAPA    Future Appointments  Date Time Provider [place]  09/27/2020 10:00 AM Mannam, Hart Robinsons, MD [facility] None    No orders of the defined types were placed in this encounter.   -------------------------------

## 2020-09-24 ENCOUNTER — Other Ambulatory Visit: Payer: Self-pay | Admitting: Psychiatry

## 2020-09-24 ENCOUNTER — Telehealth: Payer: Self-pay | Admitting: Psychiatry

## 2020-09-24 DIAGNOSIS — F5105 Insomnia due to other mental disorder: Secondary | ICD-10-CM

## 2020-09-24 MED ORDER — GABAPENTIN 800 MG PO TABS: ORAL_TABLET | ORAL | 0 refills | Status: DC

## 2020-09-24 NOTE — Telephone Encounter (Signed)
Patient called in today for refills on Gabapentin 800mg . Pharmacy [facility].

## 2020-09-27 ENCOUNTER — Ambulatory Visit (INDEPENDENT_AMBULATORY_CARE_PROVIDER_SITE_OTHER): Payer: Medicare Other | Admitting: Pulmonary Disease

## 2020-09-27 ENCOUNTER — Other Ambulatory Visit: Payer: Self-pay

## 2020-09-27 ENCOUNTER — Encounter: Payer: Self-pay | Admitting: Pulmonary Disease

## 2020-09-27 VITALS — BP 120/74 | HR 98 | Temp 97.7°F | Ht 69.0 in | Wt 194.8 lb

## 2020-09-27 DIAGNOSIS — I2729 Other secondary pulmonary hypertension: Secondary | ICD-10-CM | POA: Diagnosis not present

## 2020-09-27 DIAGNOSIS — J84112 Idiopathic pulmonary fibrosis: Secondary | ICD-10-CM | POA: Diagnosis not present

## 2020-09-27 DIAGNOSIS — R059 Cough, unspecified: Secondary | ICD-10-CM

## 2020-09-27 DIAGNOSIS — Z5181 Encounter for therapeutic drug level monitoring: Secondary | ICD-10-CM

## 2020-09-27 MED ORDER — GABAPENTIN 800 MG PO TABS: 800.0000 mg | ORAL_TABLET | Freq: Two times a day (BID) | ORAL | 3 refills | Status: DC

## 2020-09-27 NOTE — Addendum Note (Signed)
Addended by: Gavin Potters R on: 09/27/2020 02:31 PM   Modules accepted: Orders

## 2020-09-27 NOTE — Patient Instructions (Addendum)
We will increase the Neurontin dose to 800 mg twice daily Start paperwork for Ofev since [place] is not tolerated Repeat order for pulmonary rehab Place order for echocardiogram Referral to pulmonary transplant at [facility]  Follow-up in 3 months.

## 2020-09-27 NOTE — Progress Notes (Signed)
Bryan Tyler    [id number]    1946/02/22  Primary Care Physician:Parker, Algis Greenhouse, MD  Referring Physician: Vivi Barrack, [place],  [place] [id number]  Chief complaint:  Follow-up for IPF Started Esbriet November 2021, stopped in January 2022 due to side effects  HPI: 75 year old with history of pulmonary fibrosis, dyspnea, GERD, hypertension, hyperlipidemia Previously followed by Dr. Chase Caller.  Diagnosed with pulmonary fibrosis in 2019.  He was initially prescribed Ofev but denied by insurance Esbriet was then started.  He took this for 1 month and then stopped  the medication He was feeling well until December of this year when he had worsening.  Treated for pneumonia with multiple rounds of antibiotics and steroids with no improvement He is here for follow-up and reassessment.  Has significant dyspnea on exertion, cough with no mucus  Pets: Has a dog.  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  He has feather pillows which he has used for many years.  He got rid of it in November 2021 Smoking history: 20-pack-year smoker.  Quit in 1980 Travel history: Previously lived in [place].  No significant recent travel history Relevant family history: No family history of lung disease  Interim history: Had to stop Esbriet after he developed symptoms of fatigue, brain fog on max dose. Continues to have persistent cough, dyspnea on exertion.  Outpatient Encounter Medications as of 09/27/2020  Medication Sig  . albuterol (PROVENTIL HFA;VENTOLIN HFA) 108 (90 BASE) MCG/ACT inhaler Inhale 2 puffs into the lungs every 6 (six) hours as needed for wheezing.  Marland Kitchen albuterol (VENTOLIN HFA) 108 (90 Base) MCG/ACT inhaler Inhale 2 puffs into the lungs every 6 (six) hours as needed for wheezing or shortness of breath.  . ARIPiprazole (ABILIFY) 2 MG tablet Take 1 tablet (2 mg total) by mouth daily.   Marland Kitchen atorvastatin (LIPITOR) 40 MG tablet Take 1 tablet (40 mg total) by mouth every evening.  . clonazePAM (KLONOPIN) 1 MG tablet TAKE ONE AND ONE-HALF TABLETS AT BEDTIME AS NEEDED FOR ANXIETY  . gabapentin (NEURONTIN) 800 MG tablet TAKE 1 TABLET(800 MG) BY MOUTH AT BEDTIME  . levothyroxine (LEVOXYL) 112 MCG tablet Take 1 tablet (112 mcg total) by mouth daily.  . Multiple Vitamin (MULTIVITAMIN WITH MINERALS) TABS tablet Take 1 tablet by mouth daily.  . Naftifine HCl 2 % CREA Apply thin layer to affected area once daily for 2-3 weeks. (Patient taking differently: as needed. Apply thin layer to affected area once daily for 2-3 weeks.)  . omeprazole (PRILOSEC) 40 MG capsule Take 1 capsule (40 mg total) by mouth in the morning and at bedtime.  Marland Kitchen oxymetazoline (AFRIN) 0.05 % nasal spray Place 2 sprays into both nostrils 3 (three) times daily as needed for congestion.   . tamsulosin (FLOMAX) 0.4 MG CAPS capsule Take 1 capsule (0.4 mg total) by mouth daily. (Patient taking differently: Take 0.4 mg by mouth every evening.)  . venlafaxine XR (EFFEXOR XR) 75 MG 24 hr capsule 3 daily for 1 week, then 2 daily for 1 week, then 1 daily for 1 week, then stop (Patient taking differently: Take by mouth daily with breakfast. 3 daily for 1 week, then 2 daily for 1 week, then 1 daily for 1 week, then stop)  . venlafaxine XR (EFFEXOR-XR) 150 MG 24 hr capsule Take 150 mg by mouth. Take 2 capsules daily with breakfast.  .  benzonatate (TESSALON) 200 MG capsule Take 1 capsule (200 mg total) by mouth 2 (two) times daily as needed for cough. (Patient not taking: Reported on 09/27/2020)  . Pirfenidone (ESBRIET) 801 MG TABS Take 801 mg by mouth in the morning, at noon, and at bedtime. (Patient not taking: Reported on 09/27/2020)   Facility-Administered Encounter Medications as of 09/27/2020  Medication  . gabapentin (NEURONTIN) capsule 300 mg    Physical Exam: Blood pressure 120/74, pulse 98, temperature 97.7 F (36.5 C),  temperature source Temporal, height 5\' 9"  (1.753 m), weight 194 lb 12.8 oz (88.4 kg), SpO2 97 %. Gen:      No acute distress HEENT:  EOMI, sclera anicteric Neck:     No masses; no thyromegaly Lungs:    Bibasal crackles CV:         Regular rate and rhythm; no murmurs Abd:      + bowel sounds; soft, non-tender; no palpable masses, no distension Ext:    No edema; adequate peripheral perfusion Skin:      Warm and dry; no rash Neuro: alert and oriented x 3 Psych: normal mood and affect  Data Reviewed: Imaging: CT high-resolution 05/02/2020-UIP pattern pulmonary fibrosis progressive since 2013 I have reviewed the images personally.  PFTs: 05/27/2018 FVC 3.08 [77%], FEV1 2.81 [97%], F/F 91  normal spirometry, restriction possible  07/11/2020 FVC 2.42 [62%], FEV1 2.19 [77%], F/F 91, TLC 3.43 [51%] Severe restriction.  Unable to complete diffusion capacity.   Labs: CTD serologies 04/29/2018-significant for elevated CCP at 47  CMP 08/14/2020-within normal limits N-terminal proBNP 07/12/2020-27  Assessment:  Progressive pulmonary fibrosis Likely has IPF given UIP pattern Elevated CCP is likely nonsignificant.  He does have prior exposure to feather pillows but CT scan is not consistent with hypersensitivity pneumonitis.    Has not tolerated Esbriet even at a lower dose due to brain fog, fatigue Initiate paperwork for Ofev  He has not heard back from pulmonary rehab.  We will send in the order again Check echocardiogram for pulmonary hypertension screening Referral to pulmonary transplant at [facility]  Chronic cough Likely secondary to pulmonary fibrosis and GERD He is on Prilosec twice daily, Tessalon Increase Neurontin to 800 mg twice daily.  He is already on those for his psychiatrist If cough is persistent then consider codeine cough syrup but he would like to avoid narcotics given issues with brain fog   Plan/Recommendations: Stop Esbriet, start paperwork for Ofev Pulmonary rehab,  echocardiogram Pulmonary transplant referral Neurontin 800 mg twice daily   Marshell Garfinkel MD [facility] 09/27/2020, 10:31 AM  CC: Vivi Barrack, MD

## 2020-09-27 NOTE — Addendum Note (Signed)
Addended by: Dierdre Highman on: 09/27/2020 04:38 PM   Modules accepted: Orders

## 2020-09-28 ENCOUNTER — Telehealth: Payer: Self-pay | Admitting: Pharmacy Technician

## 2020-09-28 DIAGNOSIS — Z79899 Other long term (current) drug therapy: Secondary | ICD-10-CM

## 2020-09-28 DIAGNOSIS — J849 Interstitial pulmonary disease, unspecified: Secondary | ICD-10-CM

## 2020-09-28 NOTE — Telephone Encounter (Signed)
Received notification from [employer] regarding a prior authorization for OFEV 150mg . Authorization has been APPROVED from 08/29/20 to 09/28/21.   Authorization # [id number]  Patient will need to fill through [facility].

## 2020-09-28 NOTE — Telephone Encounter (Signed)
[place] help desk to complete Cost Exceed Maximum Auth. Approved 09/27/20 through 09/27/21.  Case ID# [id number]  Patient's copay is $38.00 for 30 day supply.

## 2020-09-28 NOTE — Telephone Encounter (Signed)
Received New start paperwork for OFEV 150mg . Will update as we work through the benefits process.   Submitted a Prior Authorization request to [employer] for [place] via Cover My Meds. Will update once we receive a response.   [employer] Peer - PA Case ID: [id number]

## 2020-10-02 ENCOUNTER — Telehealth: Payer: Self-pay | Admitting: Pulmonary Disease

## 2020-10-02 ENCOUNTER — Ambulatory Visit: Payer: Medicare Other | Admitting: Psychiatry

## 2020-10-02 ENCOUNTER — Other Ambulatory Visit: Payer: Self-pay

## 2020-10-02 ENCOUNTER — Ambulatory Visit (HOSPITAL_COMMUNITY): Payer: Medicare Other | Attending: Internal Medicine

## 2020-10-02 DIAGNOSIS — Z5181 Encounter for therapeutic drug level monitoring: Secondary | ICD-10-CM | POA: Insufficient documentation

## 2020-10-02 DIAGNOSIS — J84112 Idiopathic pulmonary fibrosis: Secondary | ICD-10-CM | POA: Insufficient documentation

## 2020-10-02 DIAGNOSIS — I2729 Other secondary pulmonary hypertension: Secondary | ICD-10-CM

## 2020-10-02 LAB — ECHOCARDIOGRAM COMPLETE
Area-P 1/2: 6.07 cm2
P 1/2 time: 396 msec
S' Lateral: 2.9 cm

## 2020-10-02 MED ORDER — GABAPENTIN 800 MG PO TABS: 800.0000 mg | ORAL_TABLET | Freq: Two times a day (BID) | ORAL | 1 refills | Status: AC

## 2020-10-02 MED ORDER — OFEV 150 MG PO CAPS: 150.0000 mg | ORAL_CAPSULE | Freq: Two times a day (BID) | ORAL | 5 refills | Status: DC

## 2020-10-02 NOTE — Telephone Encounter (Signed)
Rx for Ofev sent to [facility].  Left VM for patient to discuss Ofev. Will f/u on Thursday 10/04/20 to provide med counseling and discuss labwork  D/C Esbriet from medication list.  Knox Saliva, PharmD, MPH Clinical Pharmacist (Rheumatology and Pulmonology)

## 2020-10-02 NOTE — Telephone Encounter (Signed)
Spoke with patient. He was requesting to have the RX for gabapentin to be sent to [facility] instead of [facility] in [place]. The RX costs $75 at [facility] vs $12 for a 90 day supply with [facility].   I advised him that I would cancel the RX at [facility] and sent this to [facility]. He verbalized understanding.   Nothing further needed at time of call.

## 2020-10-04 ENCOUNTER — Encounter (HOSPITAL_COMMUNITY): Payer: Self-pay | Admitting: *Deleted

## 2020-10-04 NOTE — Progress Notes (Signed)
Received referral again from Dr. Vaughan Browner for this pt to participate in pulmonary rehab with the the diagnosis of IPF.  Pt referred last December and di not return our calls to schedule therefore the referral was closed. Clinical review of pt follow up appt on 2/17  Pulmonary office note.  Also his PCP visit. Pt with Covid Risk Score - 5. Pt appropriate for scheduling for Pulmonary rehab.  Will forward to support staff for scheduling and verification of insurance eligibility/benefits with pt consent. Cherre Huger, BSN Cardiac and Training and development officer

## 2020-10-05 NOTE — Telephone Encounter (Signed)
Attempt #2 to call patient to discuss Ofev approval. Will f/u next week. Rx to be sent to [facility], PharmD, MPH Clinical Pharmacist (Rheumatology and Pulmonology)

## 2020-10-05 NOTE — Telephone Encounter (Signed)
Patient returned call for Ofev counseling. He has talked to pharmacist at [facility].  Patient seen by Dr. Vaughan Browner on 09/27/20. He was previously taking Esbriet but was unable to tolerate GI side effect.   Objective:  PFT's TLC  Date Value Ref Range Status  07/11/2020 3.43 L Final    CMP     Component Value Date/Time   NA 136 08/14/2020 1238   NA 137 11/16/2019 1001   K 4.3 08/14/2020 1238   CL 99 08/14/2020 1238   CO2 30 08/14/2020 1238   GLUCOSE 102 (H) 08/14/2020 1238   BUN 14 08/14/2020 1238   BUN 12 11/16/2019 1001   CREATININE 0.90 08/14/2020 1238   CREATININE 0.86 05/25/2020 0959   CALCIUM 9.4 08/14/2020 1238   CALCIUM 9.8 07/09/2011 0819   PROT 7.5 08/14/2020 1238   ALBUMIN 4.6 08/14/2020 1238   AST 24 08/14/2020 1238   ALT 20 08/14/2020 1238   ALKPHOS 68 08/14/2020 1238   BILITOT 0.5 08/14/2020 1238   GFRNONAA 85 11/16/2019 1001   GFRAA 98 11/16/2019 1001    CBC    Component Value Date/Time   WBC 9.5 05/25/2020 0959   RBC 4.85 05/25/2020 0959   HGB 15.0 05/25/2020 0959   HCT 44.4 05/25/2020 0959   PLT 309 05/25/2020 0959   MCV 91.5 05/25/2020 0959   MCH 30.9 05/25/2020 0959   MCHC 33.8 05/25/2020 0959   RDW 12.9 05/25/2020 0959   LYMPHSABS 2.1 01/02/2020 1608   MONOABS 0.9 01/02/2020 1608   EOSABS 0.3 01/02/2020 1608   BASOSABS 0.0 01/02/2020 1608    LFT's Hepatic Function Latest Ref Rng & Units 08/14/2020 07/12/2020 05/25/2020  Total Protein 6.0 - 8.3 g/dL 7.5 7.4 7.2  Albumin 3.5 - 5.2 g/dL 4.6 4.4 -  AST 0 - 37 U/L _0 ALT 0 - 53 U/L _1 Alk Phosphatase 39 - 117 U/L 68 71 -  Total Bilirubin 0.2 - 1.2 mg/dL 0.5 0.6 0.6  Bilirubin, Direct 0.0 - 0.3 mg/dL - - -    HRCT (05/02/20): pattern of moderate pulmonary fibrosis with an apical to basal gradient featuring irregular peripheral interstitial opacity, septal thickening, areas of subpleural bronchiolectasis, and traction bronchiectasis. Areas of probable  honeycombing. Fibrotic findings appear somewhat worsened in comparison to prior examination dated 05/06/2018 and are significantly worsened over a longer period of time dating back to 02/20/2012. Findings are consistent with an UIP pattern of pulmonary fibrosis by ATS criteria.   Assessment and Plan  Thoroughly counseled patient on the efficacy, mechanism of action, dosing, administration, adverse effects, and monitoring parameters of Ofev. Patient verbalized understanding. Discussed that Ofev will not stop or reverse the progression of ILD, but rather slow progression. - Recommended to take with food and space 12 hours apart. Recommended to ensure adequate protein in meals and limit sugary foods. - Advised that he may take Imodium PRN if needed only. Advised that if he needs to hold dose for a few days to relieve diarrhea, it is recommended. - Advised that we monitor LFTs monthly for first 6 months, then every 3 months thereafter and as clinically indicated). Patient re  Rx already sent to [facility]. Patient has scheduled shipment to be received next Tuesday, 10/09/20.  F/u not yet scheduled with Dr. Vaughan Browner - advised to reach out to clinic to schedule  Knox Saliva, PharmD, MPH Clinical Pharmacist (Rheumatology and Pulmonology)

## 2020-10-08 ENCOUNTER — Other Ambulatory Visit: Payer: Self-pay

## 2020-10-08 ENCOUNTER — Encounter: Payer: Self-pay | Admitting: Family Medicine

## 2020-10-08 ENCOUNTER — Ambulatory Visit (INDEPENDENT_AMBULATORY_CARE_PROVIDER_SITE_OTHER): Payer: Medicare Other | Admitting: Family Medicine

## 2020-10-08 ENCOUNTER — Ambulatory Visit (INDEPENDENT_AMBULATORY_CARE_PROVIDER_SITE_OTHER): Payer: Medicare Other

## 2020-10-08 VITALS — BP 132/91 | HR 104 | Temp 98.5°F | Ht 69.0 in | Wt 192.0 lb

## 2020-10-08 DIAGNOSIS — R0781 Pleurodynia: Secondary | ICD-10-CM

## 2020-10-08 DIAGNOSIS — M1990 Unspecified osteoarthritis, unspecified site: Secondary | ICD-10-CM | POA: Diagnosis not present

## 2020-10-08 DIAGNOSIS — J849 Interstitial pulmonary disease, unspecified: Secondary | ICD-10-CM

## 2020-10-08 DIAGNOSIS — M7732 Calcaneal spur, left foot: Secondary | ICD-10-CM | POA: Diagnosis not present

## 2020-10-08 DIAGNOSIS — S2242XA Multiple fractures of ribs, left side, initial encounter for closed fracture: Secondary | ICD-10-CM | POA: Diagnosis not present

## 2020-10-08 MED ORDER — TRAMADOL HCL 50 MG PO TABS
50.0000 mg | ORAL_TABLET | Freq: Three times a day (TID) | ORAL | 0 refills | Status: AC | PRN
Start: 2020-10-08 — End: 2020-10-13

## 2020-10-08 NOTE — Assessment & Plan Note (Signed)
Has been following with pulmonology.  He is now on nintedanib.

## 2020-10-08 NOTE — Assessment & Plan Note (Signed)
Pain in left ankle likely secondary to OA.  Will check chest x-ray today.  May need referral to sports medicine depending on results of x-ray.

## 2020-10-08 NOTE — Telephone Encounter (Signed)
Patient is calling in wondering if someone is able to give him a call about his xray results.

## 2020-10-08 NOTE — Patient Instructions (Signed)
It was very nice to see you today!  You probably have a broken rib.  You probably have arthritis in your foot.  Please make sure that you are wearing footwear with good arch support.  We will check x-rays today.  Also send in a prescription for a pain medication called tramadol.  Please let me know if your pain is not improving.  Take care, Dr [name]  Please try these tips to maintain a healthy lifestyle:   Eat at least 3 REAL meals and 1-2 snacks per day.  Aim for no more than 5 hours between eating.  If you eat breakfast, please do so within one hour of getting up.    Each meal should contain half fruits/vegetables, one quarter protein, and one quarter carbs (no bigger than a computer mouse)   Cut down on sweet beverages. This includes juice, soda, and sweet tea.     Drink at least 1 glass of water with each meal and aim for at least 8 glasses per day   Exercise at least 150 minutes every week.

## 2020-10-08 NOTE — Progress Notes (Signed)
Please inform patient of the following:  Xrays confirm rib fracture. This should heal in the next several weeks.  His ankle xray show a bone spur but I do not think this is the source of his pain. Recommend referral to sports medicine for further evaluation and management.  Bryan Tyler. [name], MD 10/08/2020 9:49 AM

## 2020-10-08 NOTE — Progress Notes (Signed)
   Bryan Tyler is a 75 y.o. male who presents today for an office visit.  Assessment/Plan:  New/Acute Problems: Left Rib Pain Likely fracture.  No red flags.  Will check chest x-ray today.  Has not tolerated codeine in the past.  We will send in small supply of tramadol as needed for pain control.  Discussed reasons to return to care.  Follow-up as needed.  Chronic Problems Addressed Today: Osteoarthritis Pain in left ankle likely secondary to OA.  Will check chest x-ray today.  May need referral to sports medicine depending on results of x-ray.  Interstitial lung disease ([place]) Has been following with pulmonology.  He is now on nintedanib.     Subjective:  HPI:  Patient here with couple of concerns.  He fell into the bathtub 6 days ago.  He was with his dog when he got tripped up and fell onto the bathtub.  Landed on his left side.  Has had pain in left lower ribs since then.  Has tried taking Tylenol with modest improvement.  Pain is worse with coughing and deep inspiration.  He has a history of interstitial lung disease and has shortness of breath at baseline but none worse than that.  He has also had ongoing issues with left ankle pain.  Apparently he broke his ankle about 6 years ago.  Has had return of pain over the last several months.  Located in left ankle and sometimes radiate into left leg.       Objective:  Physical Exam: BP (!) 132/91   Pulse (!) 104   Temp 98.5 F (36.9 C)   Ht 5\' 9"  (1.753 m)   Wt 192 lb (87.1 kg)   SpO2 94%   BMI 28.35 kg/m   Gen: No acute distress, resting comfortably CV: Regular rate and rhythm with no murmurs appreciated Pulm: Normal work of breathing, clear to auscultation bilaterally with no crackles, wheezes, or rhonchi Chest: No deformities.  Tender to palpation along left lateral lower ribs. MSK: Left ankle without deformities.  Stable to inversion and eversion.  Drawer signs negative.  Also transverse arch noted in bilateral  feet. Neuro: Grossly normal, moves all extremities Psych: Normal affect and thought content      Trenika Hudson M. [name], MD 10/08/2020 8:38 AM

## 2020-10-10 NOTE — Telephone Encounter (Signed)
Patient received first shipment of Ofev on 10/05/20

## 2020-10-10 NOTE — Progress Notes (Signed)
Send information over [employer]

## 2020-10-11 NOTE — Telephone Encounter (Signed)
Information was send via MyChart

## 2020-10-12 ENCOUNTER — Telehealth: Payer: Self-pay | Admitting: Pulmonary Disease

## 2020-10-12 DIAGNOSIS — J84112 Idiopathic pulmonary fibrosis: Secondary | ICD-10-CM

## 2020-10-12 DIAGNOSIS — J849 Interstitial pulmonary disease, unspecified: Secondary | ICD-10-CM

## 2020-10-12 MED ORDER — OFEV 100 MG PO CAPS: 100.0000 mg | ORAL_CAPSULE | Freq: Two times a day (BID) | ORAL | 2 refills | Status: DC

## 2020-10-12 NOTE — Telephone Encounter (Signed)
PM please advise if the ofev dose needs to be adjusted for the pt since he is having diarrhea. Thanks  Allergies  Allergen Reactions  . Codeine Nausea And Vomiting and Other (See Comments)    DIZZINESS  . Amoxicillin-Pot Clavulanate   . Ciprofloxacin Other (See Comments)    Unknown  . Quinolones     Patient was warned about not using quinolones. Recent studies have raised concern that fluoroquinolone antibiotics could be associated with an increased risk of aortic aneurysm Fluoroquinolones have non-antimicrobial properties that might jeopardise the integrity of the extracellular matrix of the vascular wallthere was a 66% increased rate of aortic aneurysm or dissection associated with oral fluoroquinolone use, compared with amoxicillin use, within a 60 day risk period from start of treatment

## 2020-10-12 NOTE — Addendum Note (Signed)
Addended by: Cassandria Anger on: 10/12/2020 12:51 PM   Modules accepted: Orders

## 2020-10-12 NOTE — Telephone Encounter (Signed)
Yes.  Please have him hold Ofev for a week and restart at 100mg  bid Order immodium 4 mg po bid as needed for diarrhea

## 2020-10-12 NOTE — Telephone Encounter (Signed)
Spoke with [facility] repersenative and gave verbal order to decrease Ofev to 100mg  BID after holding for 1 week. Repersentive stated understatement. Spoke with pt and reviewed Dr. Matilde Bash recommendations. Pt stated understanding. Nothing further needed at this time.

## 2020-10-15 ENCOUNTER — Telehealth: Payer: Self-pay

## 2020-10-15 MED ORDER — TRAMADOL HCL 50 MG PO TABS: 50.0000 mg | ORAL_TABLET | Freq: Three times a day (TID) | ORAL | 0 refills | Status: AC | PRN

## 2020-10-15 MED ORDER — BENZONATATE 200 MG PO CAPS: 200.0000 mg | ORAL_CAPSULE | Freq: Two times a day (BID) | ORAL | 1 refills | Status: DC | PRN

## 2020-10-15 NOTE — Telephone Encounter (Signed)
Pt is requesting his tramadol be sent to [facility] in [place].   He is also experiencing great pain in his ribs when he coughs. He is asking if there is anything he can do to help with the cough.

## 2020-10-15 NOTE — Addendum Note (Signed)
Addended by: Vivi Barrack on: 10/15/2020 04:04 PM   Modules accepted: Orders

## 2020-10-15 NOTE — Telephone Encounter (Signed)
Patient requesting Rx Tramadol

## 2020-10-17 ENCOUNTER — Other Ambulatory Visit: Payer: Self-pay | Admitting: Pulmonary Disease

## 2020-10-25 ENCOUNTER — Encounter (HOSPITAL_COMMUNITY): Payer: Self-pay

## 2020-10-25 ENCOUNTER — Telehealth (HOSPITAL_COMMUNITY): Payer: Self-pay

## 2020-10-25 NOTE — Telephone Encounter (Signed)
Called patient to see if he is interested in the Pulmonary Rehab Program. Patient expressed interest. Explained scheduling process and went over insurance, patient verbalized understanding. Someone from our pulmonary rehab staff will contact pt at a later time. 

## 2020-10-25 NOTE — Telephone Encounter (Signed)
Pt insurance is active and benefits verified through Medicare a/b Co-pay 0, DED $233/$233 met, out of pocket 0/0 met, co-insurance 20%. no pre-authorization required.   Pt also has [employer] and will contact them to see what the coverage is on the pulmonary rehab.

## 2020-11-12 ENCOUNTER — Other Ambulatory Visit (INDEPENDENT_AMBULATORY_CARE_PROVIDER_SITE_OTHER): Payer: Medicare Other

## 2020-11-12 DIAGNOSIS — Z79899 Other long term (current) drug therapy: Secondary | ICD-10-CM

## 2020-11-12 LAB — HEPATIC FUNCTION PANEL
ALT: 18 U/L (ref 0–53)
AST: 21 U/L (ref 0–37)
Albumin: 4.2 g/dL (ref 3.5–5.2)
Alkaline Phosphatase: 93 U/L (ref 39–117)
Bilirubin, Direct: 0.1 mg/dL (ref 0.0–0.3)
Total Bilirubin: 0.5 mg/dL (ref 0.2–1.2)
Total Protein: 7.4 g/dL (ref 6.0–8.3)

## 2020-11-15 ENCOUNTER — Ambulatory Visit (INDEPENDENT_AMBULATORY_CARE_PROVIDER_SITE_OTHER): Payer: Medicare Other | Admitting: Psychiatry

## 2020-11-15 ENCOUNTER — Encounter: Payer: Self-pay | Admitting: Psychiatry

## 2020-11-15 ENCOUNTER — Other Ambulatory Visit: Payer: Self-pay

## 2020-11-15 DIAGNOSIS — F411 Generalized anxiety disorder: Secondary | ICD-10-CM

## 2020-11-15 DIAGNOSIS — F5105 Insomnia due to other mental disorder: Secondary | ICD-10-CM

## 2020-11-15 DIAGNOSIS — F331 Major depressive disorder, recurrent, moderate: Secondary | ICD-10-CM | POA: Diagnosis not present

## 2020-11-15 MED ORDER — VENLAFAXINE HCL ER 150 MG PO CP24
300.0000 mg | ORAL_CAPSULE | Freq: Every day | ORAL | 1 refills | Status: DC
Start: 2020-11-15 — End: 2021-03-20

## 2020-11-15 NOTE — Progress Notes (Signed)
Bryan Tyler [id number] 1946/03/05 75 y.o.  Subjective:   Patient ID:  Bryan Tyler is a 75 y.o. (DOB [birth year]) male.  Chief Complaint:  Chief Complaint  Patient presents with  . Follow-up  . Major depressive disorder, recurrent episode, moderate ([place])    HPI Bryan Tyler presents to the office today for follow-up of history of major depression and anxiety and insomnia.  seen June 2020.  The only medication change was to reduce Abilify from 5 mg to 2 mg daily to see if tremor would improve.  He had residual anxiety but not much depression at the time.  Patient called April 22, 2019 asking for refill of clonazepam early indicating he had increased the dosages on his own to 1.5 mg instead of 1 mg.  He was informed he could not change the dosage of a controlled substance without our permission.  seen December 2020.  The following was noted: Tremor resolved with reduction in Abilify.  Wants to go through meds and their purpose. No history of RLS.   Ran out of Klonopin lately and only sleeping 3-4  Hours.  Usually sleeps oK with 1 mg nightly. Overall anxiety and depression and sleep are managed.  Because of polypharmacy we elected to try to discontinue buspirone and he was to let us know if he had any rebound anxiety.  December 07, 2019 appointment the following is noted: More trouble with sleep and Klonopin 1 mg no longer getting him to sleep and will take another 1/2 tablet.  Then runs out of the Klonopin early. No SE from 1.5 mg Klonopin. Sleep 7 hours nightly.  45 nap in day.   2 cups coffee AM, not daily other caffeine but sometimes tea with dinner.   Work 3 days week. Plan: No med changes  06/05/2020 appointment with the following noted: Rough physically, pneumonia July and then dx pulm fibrosis.  Feels not well.  No Covid.  Not smoker. Stayed on Effexor XR 300, Abilify 2 mg  And clonazepam 1.0-1.5 mg HS. Plan: He wonders about med change to try to help with the  recurrent depression.  His depression had been in remission on Abilify 5 mg and then 2 mg daily. The most logical alternative would be switching from Abilify to Rexulti 1 mg for 1 week and then if no response increase to 2 mg.  He had tremors on 5 mg of Abilify so going up and Abilify is not a good option.  08/01/2020 appointment with the following noted: He called since being here indicating the [place] was making him too tired.  He was offered the option of reducing the dose or returning to Abilify. He thinks he returned to Abilify but not certain. Function OK but thinks he's still depressed.    Pulmonary fibrosis dx and RX Esbriet and it makes him feel out of it. Plan: Reduce Venlafaxine to 3 of the 75 mg capsules and add 30 mg duloxetine for 1 week, Then reduce venlafaxine to 2 of the 75 mg capsules and increase duloxetine to 2 capsules daily for 1 week, Then reduce venlafaxine to one of the 75 mg capsules and increase duloxetine to 3 capsules daily for 1 week, Then stop venlafaxine and continue 3 duloxetine daily  09/17/2020 appointment urgently scheduled by the patient: Got confused and didn't make the change to duloxetine.  Didn't know if he was supposed to take both at the same time.  Had brain fog from new med for pulmonary fibrosis Esprit  and that compounded difficulty understand what to do and stopped that med too.    He doesn't want to take a change at this time.  Not necessary at this time. Plan: no med changes  11/15/2020 appt with following noted: No SE with meds except for Pulm fibrosis. No SOB if not exerting. No SE with psych meds.   Stress cousin died with debt and he's going to be the Art therapist.  Depression is pretty good and  manageable.  Anxiety is residual with stress.   Pt reports couldn't go to sleep before clonazepam and when ran out. Been able to sleep with 1 mg clonazepam.  Pt reports that appetite is good. Pt reports that energy is less good.  Concentration is  better than last visit. Suicidal thoughts:  denied by patient.  Past Psychiatric Medication Trials: Effexor 300, Abilify 5 started 2017,  Rexulti 2 mg tired  Buspirone 15 BID, Clonazepam 1 &1/2 mg nightly Gabapentin 800 nightly Belsomra Lorazepam Mirtazapine 30 Trazodone 150  Review of Systems:  Review of Systems  Respiratory: Positive for shortness of breath.   Cardiovascular: Negative for chest pain and palpitations.       Enlarged aorta with follow-up next month.  Neurological: Negative for dizziness, tremors and weakness.  Neuro said no Parkinson's dz.  Medications: I have reviewed the patient's current medications.  Current Outpatient Medications  Medication Sig Dispense Refill  . albuterol (PROVENTIL HFA;VENTOLIN HFA) 108 (90 BASE) MCG/ACT inhaler Inhale 2 puffs into the lungs every 6 (six) hours as needed for wheezing.    Marland Kitchen albuterol (VENTOLIN HFA) 108 (90 Base) MCG/ACT inhaler Inhale 2 puffs into the lungs every 6 (six) hours as needed for wheezing or shortness of breath. 18 g 2  . ARIPiprazole (ABILIFY) 2 MG tablet Take 1 tablet (2 mg total) by mouth daily. 90 tablet 1  . atorvastatin (LIPITOR) 40 MG tablet Take 1 tablet (40 mg total) by mouth every evening. 90 tablet 3  . benzonatate (TESSALON) 200 MG capsule Take 1 capsule (200 mg total) by mouth 2 (two) times daily as needed for cough. 60 capsule 1  . clonazePAM (KLONOPIN) 1 MG tablet TAKE ONE AND ONE-HALF TABLETS AT BEDTIME AS NEEDED FOR ANXIETY (Patient taking differently: TAKE ONE TABLET AT BEDTIME AS NEEDED FOR ANXIETY) 135 tablet 1  . gabapentin (NEURONTIN) 800 MG tablet Take 1 tablet (800 mg total) by mouth 2 (two) times daily. 180 tablet 1  . levothyroxine (LEVOXYL) 112 MCG tablet Take 1 tablet (112 mcg total) by mouth daily. 90 tablet 3  . Multiple Vitamin (MULTIVITAMIN WITH MINERALS) TABS tablet Take 1 tablet by mouth daily.    . Naftifine HCl 2 % CREA Apply thin layer to affected area once daily for 2-3 weeks.  (Patient taking differently: as needed. Apply thin layer to affected area once daily for 2-3 weeks.) 45 g 0  . Nintedanib (OFEV) 100 MG CAPS Take 1 capsule (100 mg total) by mouth 2 (two) times daily. 60 capsule 2  . omeprazole (PRILOSEC) 40 MG capsule Take 1 capsule (40 mg total) by mouth in the morning and at bedtime. 90 capsule 3  . oxymetazoline (AFRIN) 0.05 % nasal spray Place 2 sprays into both nostrils 3 (three) times daily as needed for congestion.     . tamsulosin (FLOMAX) 0.4 MG CAPS capsule Take 1 capsule (0.4 mg total) by mouth daily. (Patient taking differently: Take 0.4 mg by mouth every evening.) 30 capsule 3  . venlafaxine XR (EFFEXOR-XR) 150  MG 24 hr capsule Take 2 capsules (300 mg total) by mouth daily. 180 capsule 1   No current facility-administered medications for this visit.    Medication Side Effects: Other: brief dizzy when stands up  Allergies:  Allergies  Allergen Reactions  . Codeine Nausea And Vomiting and Other (See Comments)    DIZZINESS  . Amoxicillin-Pot Clavulanate   . Ciprofloxacin Other (See Comments)    Unknown  . Quinolones     Patient was warned about not using quinolones. Recent studies have raised concern that fluoroquinolone antibiotics could be associated with an increased risk of aortic aneurysm Fluoroquinolones have non-antimicrobial properties that might jeopardise the integrity of the extracellular matrix of the vascular wallthere was a 66% increased rate of aortic aneurysm or dissection associated with oral fluoroquinolone use, compared with amoxicillin use, within a 60 day risk period from start of treatment       Past Medical History:  Diagnosis Date  . Adenocarcinoma of prostate ([place]) 04/16/2013  . Aneurysm, aorta, thoracic ([place])   . Anxiety   . Arthritis    "knees" (05/21/2017)  . Depression   . Diverticulosis   . Dyspnea    on exertion for a long period time  . GERD (gastroesophageal reflux disease)   . Hyperlipidemia   .  Hypertension   . Hypothyroidism, postradioiodine therapy    1980's  . Nocturia   . Organic impotence   . OSA (obstructive sleep apnea)    NO CPAP--  S/P SURGERY  2002  . Osteoporosis   . Pneumonia 1970  . PONV (postoperative nausea and vomiting)   . Urge urinary incontinence   . Wears contact lenses     Family History  Problem Relation Age of Onset  . Cancer Mother        Pancreatic Cancer  . Heart disease Father 80       CABG    Social History   Socioeconomic History  . Marital status: Married    Spouse name: Not on file  . Number of children: Not on file  . Years of education: Not on file  . Highest education level: Not on file  Occupational History  . Occupation: Patent examiner  Tobacco Use  . Smoking status: Former Smoker    Packs/day: 1.00    Years: 10.00    Pack years: 10.00    Types: Cigarettes    Quit date: 08/11/1978    Years since quitting: 42.2  . Smokeless tobacco: Never Used  Vaping Use  . Vaping Use: Never used  Substance and Sexual Activity  . Alcohol use: Yes    Comment: 05/21/2017 "1-2 beers/month"  . Drug use: No  . Sexual activity: Not Currently  Other Topics Concern  . Not on file  Social History Narrative  . Not on file   Social Determinants of Health   Financial Resource Strain: Not on file  Food Insecurity: Not on file  Transportation Needs: Not on file  Physical Activity: Not on file  Stress: Not on file  Social Connections: Not on file  Intimate Partner Violence: Not on file    Past Medical History, Surgical history, Social history, and Family history were reviewed and updated as appropriate.   Please see review of systems for further details on the patient's review from today.   Objective:   Physical Exam:  There were no vitals taken for this visit.  Physical Exam Constitutional:      General: He is not in acute  distress.    Appearance: He is well-developed.  Musculoskeletal:        General: No deformity.   Neurological:     Mental Status: He is alert and oriented to person, place, and time.     Motor: Tremor present.     Coordination: Coordination normal.     Comments: Tremor minimal  Psychiatric:        Attention and Perception: Attention and perception normal. He does not perceive auditory or visual hallucinations.        Mood and Affect: Mood is anxious and depressed. Affect is not labile, blunt, angry, tearful or inappropriate.        Speech: Speech normal.        Behavior: Behavior normal.        Thought Content: Thought content normal. Thought content does not include homicidal or suicidal ideation. Thought content does not include homicidal or suicidal plan.        Cognition and Memory: Cognition and memory normal.        Judgment: Judgment normal.     Comments: Insight intact. No delusions.  Residual managed anxiety and depression.     Lab Review:     Component Value Date/Time   NA 136 08/14/2020 1238   NA 137 11/16/2019 1001   K 4.3 08/14/2020 1238   CL 99 08/14/2020 1238   CO2 30 08/14/2020 1238   GLUCOSE 102 (H) 08/14/2020 1238   BUN 14 08/14/2020 1238   BUN 12 11/16/2019 1001   CREATININE 0.90 08/14/2020 1238   CREATININE 0.86 05/25/2020 0959   CALCIUM 9.4 08/14/2020 1238   CALCIUM 9.8 07/09/2011 0819   PROT 7.4 11/12/2020 1310   ALBUMIN 4.2 11/12/2020 1310   AST 21 11/12/2020 1310   ALT 18 11/12/2020 1310   ALKPHOS 93 11/12/2020 1310   BILITOT 0.5 11/12/2020 1310   GFRNONAA 85 11/16/2019 1001   GFRAA 98 11/16/2019 1001       Component Value Date/Time   WBC 9.5 05/25/2020 0959   RBC 4.85 05/25/2020 0959   HGB 15.0 05/25/2020 0959   HCT 44.4 05/25/2020 0959   PLT 309 05/25/2020 0959   MCV 91.5 05/25/2020 0959   MCH 30.9 05/25/2020 0959   MCHC 33.8 05/25/2020 0959   RDW 12.9 05/25/2020 0959   LYMPHSABS 2.1 01/02/2020 1608   MONOABS 0.9 01/02/2020 1608   EOSABS 0.3 01/02/2020 1608   BASOSABS 0.0 01/02/2020 1608    No results found for: POCLITH,  LITHIUM   No results found for: PHENYTOIN, PHENOBARB, VALPROATE, CBMZ   .res Assessment: Plan:    Bryan Tyler was seen today for follow-up and major depressive disorder, recurrent episode, moderate (hcc).  Diagnoses and all orders for this visit:  Major depressive disorder, recurrent episode, moderate (HCC) -     venlafaxine XR (EFFEXOR-XR) 150 MG 24 hr capsule; Take 2 capsules (300 mg total) by mouth daily.  Generalized anxiety disorder -     venlafaxine XR (EFFEXOR-XR) 150 MG 24 hr capsule; Take 2 capsules (300 mg total) by mouth daily.  Insomnia due to mental condition    Greater than 50% of 30 min face to face time with patient was spent on counseling and coordination of care. We discussed that Bryan Tyler has a long history of depression and anxiety the has been fairly well controlled in recent years.  He was on the maximum dosage of venlafaxine and when he had a recurrence of depression in 2017, aripiprazole 2 mg was added.  His depression had been in remission on Abilify 5 mg and then 2 mg daily.   Discussed potential metabolic side effects associated with atypical antipsychotics, as well as potential risk for movement side effects. Advised pt to contact office if movement side effects occur.   No indication for med change Continue venlafaxine XR 300 mg daily Abillify 2  Option change Effexor to duloxetine or Trintellix.   Disc SE in detail and SSRI withdrawal sx. Option buspirone.  Extensive discussion of sleep hygiene.  No caffeine after 3 pm.    Defer weaning gabapentin bc residual significant anxiety.  We discussed the short-term risks associated with benzodiazepines including sedation and increased fall risk among others.  Discussed long-term side effect risk including dependence, potential withdrawal symptoms, and the potential eventual dose-related risk of dementia.  Also disc risk affecting balance and memory.  No higher dose than 1-1.5 mg but that dose will be allowed since  failed other sleepers and tolerating it.  Cautioned again against exceeding dosage of clonazepam without my OK.  Supportive therapy in dealing with his new diagnosis of pulmonary fibrosis.  Encouraged him to take it 1 step at a time and see what pulmonology can offer.  They have suggested a med trial which is in process..  This appointment was 30 minutes  Follow-up 2-3 mos  Lynder Parents, MD, DFAPA    Future Appointments  Date Time Provider [place]  12/25/2020  9:45 AM Marshell Garfinkel, MD [facility] None    No orders of the defined types were placed in this encounter.   -------------------------------

## 2020-11-21 NOTE — Telephone Encounter (Signed)
Called pt to schedule for pulmonary rehab pt stated that he still wanted to participate but he was going out of town and stated that maybe in a month or so, I advised pt to call back when he is available. Closed referral

## 2020-11-28 ENCOUNTER — Encounter: Payer: Self-pay | Admitting: Family Medicine

## 2020-11-28 ENCOUNTER — Ambulatory Visit (INDEPENDENT_AMBULATORY_CARE_PROVIDER_SITE_OTHER): Payer: Medicare Other | Admitting: Family Medicine

## 2020-11-28 ENCOUNTER — Other Ambulatory Visit: Payer: Self-pay

## 2020-11-28 VITALS — BP 122/84 | HR 88 | Temp 98.1°F | Wt 192.6 lb

## 2020-11-28 DIAGNOSIS — J849 Interstitial pulmonary disease, unspecified: Secondary | ICD-10-CM

## 2020-11-28 DIAGNOSIS — M79673 Pain in unspecified foot: Secondary | ICD-10-CM | POA: Diagnosis not present

## 2020-11-28 MED ORDER — GUAIFENESIN-CODEINE 100-10 MG/5ML PO SYRP: 5.0000 mL | ORAL_SOLUTION | Freq: Three times a day (TID) | ORAL | 1 refills | Status: DC | PRN

## 2020-11-28 NOTE — Assessment & Plan Note (Signed)
He is following with pulmonology.  He is on nintedanib.  We will start codeine cough syrup as above.

## 2020-11-28 NOTE — Progress Notes (Signed)
   Bryan Tyler is a 75 y.o. male who presents today for an office visit.  Assessment/Plan:  New/Acute Problems: Left Rib Pain Improving steadily though still has persistent pain especially with coughing.  We will send in guaifenesin-codeine cough syrup.  He will let me know if pain does not continue to improve over the next couple of weeks.  Chronic Problems Addressed Today: Interstitial lung disease ([place]) He is following with pulmonology.  He is on nintedanib.  We will start codeine cough syrup as above.  Foot pain No red flags.  Likely plantar fasciitis.  X-rays few weeks ago showed calcaneal spur.  Will place referral to sports medicine for further management evaluation.     Subjective:  HPI:  Patient here for rib pain follow-up.  Was seen for this about 6 weeks ago.  Was found to have nondisplaced left lower rib fractures.  Symptoms have improved steadily.  He has history of pulmonary fibrosis.  Has quite a bit of frequent cough.  This is causing his pain to worsen.  We prescribed Tessalon several weeks ago but this has not helped.  He is otherwise doing well.  He would like to see a foot specialist for his foot and ankle pain.       Objective:  Physical Exam: BP 122/84   Pulse 88   Temp 98.1 F (36.7 C) (Temporal)   Wt 192 lb 9.6 oz (87.4 kg)   SpO2 99%   BMI 28.44 kg/m   Gen: No acute distress, resting comfortably CV: Regular rate and rhythm with no murmurs appreciated Pulm: Normal work of breathing, clear to auscultation bilaterally with no crackles, wheezes, or rhonchi Neuro: Grossly normal, moves all extremities Psych: Normal affect and thought content      Letti Towell M. [name], MD 11/28/2020 12:09 PM

## 2020-11-28 NOTE — Assessment & Plan Note (Signed)
No red flags.  Likely plantar fasciitis.  X-rays few weeks ago showed calcaneal spur.  Will place referral to sports medicine for further management evaluation.

## 2020-11-28 NOTE — Patient Instructions (Signed)
It was very nice to see you today!  Please take the cough syrup.  Let me know if you have any side effects.  Let me know if your ribs are not improving on the next several weeks.  I will place a referral for you to see a sports medicine doctor today as well.  Take care, Dr [name]  PLEASE NOTE:  If you had any lab tests please let us know if you have not heard back within a few days. You may see your results on mychart before we have a chance to review them but we will give you a call once they are reviewed by [place]. If we ordered any referrals today, please let us know if you have not heard from their office within the next week.   Please try these tips to maintain a healthy lifestyle:   Eat at least 3 REAL meals and 1-2 snacks per day.  Aim for no more than 5 hours between eating.  If you eat breakfast, please do so within one hour of getting up.    Each meal should contain half fruits/vegetables, one quarter protein, and one quarter carbs (no bigger than a computer mouse)   Cut down on sweet beverages. This includes juice, soda, and sweet tea.     Drink at least 1 glass of water with each meal and aim for at least 8 glasses per day   Exercise at least 150 minutes every week.

## 2020-11-29 ENCOUNTER — Other Ambulatory Visit: Payer: Self-pay

## 2020-11-29 ENCOUNTER — Ambulatory Visit (INDEPENDENT_AMBULATORY_CARE_PROVIDER_SITE_OTHER): Payer: Medicare Other | Admitting: Family Medicine

## 2020-11-29 ENCOUNTER — Encounter: Payer: Self-pay | Admitting: Family Medicine

## 2020-11-29 ENCOUNTER — Ambulatory Visit: Payer: Self-pay

## 2020-11-29 VITALS — BP 112/78 | HR 92 | Ht 69.0 in | Wt 190.6 lb

## 2020-11-29 DIAGNOSIS — M79671 Pain in right foot: Secondary | ICD-10-CM | POA: Diagnosis not present

## 2020-11-29 DIAGNOSIS — M21621 Bunionette of right foot: Secondary | ICD-10-CM

## 2020-11-29 NOTE — Patient Instructions (Signed)
Thank you for coming in today.  You have a painful bunionette   Try modifying your shoe to reduce pressure.   Modify isole  Cut the shoe  Try a bunionette pad  Please use voltaren gel up to 4x daily for pain as needed.   Recheck in about 1 month.   Bring a few more shoes with you.

## 2020-11-29 NOTE — Progress Notes (Signed)
   I, Wendy Poet, LAT, ATC, am serving as scribe for Dr. Lynne Leader.  Subjective:    I'm seeing this patient as a consultation for Dr. Dimas Chyle. Note will be routed back to referring provider/PCP.  CC: R foot pain  HPI: Pt is a 75 y/o male c/o R foot pain x 5 months w/ no known MOI. Pt fx his L ankle about 6 years ago. Pt locates pain to his R distal 5th MT.  R foot swelling: No Aggravates: Walking; pressure to that area Treatments tried: Nothing  Dx imaging: 10/08/20 L ankle XR  Past medical history, Surgical history, Family history, Social history, Allergies, and medications have been entered into the medical record, reviewed.   Review of Systems: No new headache, visual changes, nausea, vomiting, diarrhea, constipation, dizziness, abdominal pain, skin rash, fevers, chills, night sweats, weight loss, swollen lymph nodes, body aches, joint swelling, muscle aches, chest pain, shortness of breath, mood changes, visual or auditory hallucinations.   Objective:    Vitals:   11/29/20 1251  BP: 112/78  Pulse: 92  SpO2: 95%   General: Well Developed, well nourished, and in no acute distress.  Neuro/Psych: Alert and oriented x3, extra-ocular muscles intact, able to move all 4 extremities, sensation grossly intact. Skin: Warm and dry, no rashes noted.  Respiratory: Not using accessory muscles, speaking in full sentences, trachea midline.  Cardiovascular: Pulses palpable, no extremity edema. Abdomen: Does not appear distended. MSK: Right foot bunionette with small callus overlying bunionette.  Mildly tender to palpation.  Lab and Radiology Results  Diagnostic Limited MSK Ultrasound of: Right lateral foot at fifth metatarsal head Bone prominence.  Slight hypoechoic fluid overlying bone consistent with bone irritation.  No fractures visible. Impression: Bunionette irritated   Impression and Recommendations:    Assessment and Plan: 75 y.o. male with right foot bunionette with  callus.  This is irritated.  Fundamental problem is excessive pressure on this area.  Spent time today discussing some pragmatic solutions to offload pressure including modifying footwear.  Provided several different solutions to try including modified insole, cutting a hole in his shoe, bunionette shield, bunion pad, and Voltaren gel.  Additionally modified his lacing pattern to his shoe to offload pressure in this area.  We will reassess in 1 month.  If not better consider injection.Marland Kitchen  PDMP not reviewed this encounter. Orders Placed This Encounter  Procedures  . [place] LIMITED JOINT SPACE STRUCTURES LOW RIGHT(NO LINKED CHARGES)    Order Specific Question:   Reason for Exam (SYMPTOM  OR DIAGNOSIS REQUIRED)    Answer:   R foot pain    Order Specific Question:   Preferred imaging location?    Answer:   [facility]   No orders of the defined types were placed in this encounter.   Discussed warning signs or symptoms. Please see discharge instructions. Patient expresses understanding.   The above documentation has been reviewed and is accurate and complete Lynne Leader, M.D.

## 2020-12-20 ENCOUNTER — Telehealth: Payer: Self-pay | Admitting: Pulmonary Disease

## 2020-12-20 DIAGNOSIS — J849 Interstitial pulmonary disease, unspecified: Secondary | ICD-10-CM

## 2020-12-20 DIAGNOSIS — J84112 Idiopathic pulmonary fibrosis: Secondary | ICD-10-CM

## 2020-12-20 MED ORDER — OFEV 100 MG PO CAPS: 100.0000 mg | ORAL_CAPSULE | Freq: Two times a day (BID) | ORAL | 5 refills | Status: DC

## 2020-12-20 NOTE — Telephone Encounter (Signed)
RX has been sent to [facility].

## 2020-12-25 ENCOUNTER — Encounter: Payer: Self-pay | Admitting: *Deleted

## 2020-12-25 ENCOUNTER — Other Ambulatory Visit: Payer: Self-pay

## 2020-12-25 ENCOUNTER — Encounter: Payer: Self-pay | Admitting: Pulmonary Disease

## 2020-12-25 ENCOUNTER — Ambulatory Visit (INDEPENDENT_AMBULATORY_CARE_PROVIDER_SITE_OTHER): Payer: Medicare Other | Admitting: Pulmonary Disease

## 2020-12-25 VITALS — BP 138/80 | HR 90 | Temp 97.9°F | Ht 69.0 in | Wt 187.4 lb

## 2020-12-25 DIAGNOSIS — Z79899 Other long term (current) drug therapy: Secondary | ICD-10-CM | POA: Diagnosis not present

## 2020-12-25 DIAGNOSIS — Z5181 Encounter for therapeutic drug level monitoring: Secondary | ICD-10-CM | POA: Diagnosis not present

## 2020-12-25 DIAGNOSIS — J84112 Idiopathic pulmonary fibrosis: Secondary | ICD-10-CM

## 2020-12-25 LAB — HEPATIC FUNCTION PANEL
ALT: 18 U/L (ref 0–53)
AST: 23 U/L (ref 0–37)
Albumin: 4.2 g/dL (ref 3.5–5.2)
Alkaline Phosphatase: 77 U/L (ref 39–117)
Bilirubin, Direct: 0.1 mg/dL (ref 0.0–0.3)
Total Bilirubin: 0.7 mg/dL (ref 0.2–1.2)
Total Protein: 7.3 g/dL (ref 6.0–8.3)

## 2020-12-25 MED ORDER — BENZONATATE 200 MG PO CAPS
200.0000 mg | ORAL_CAPSULE | Freq: Two times a day (BID) | ORAL | 2 refills | Status: DC | PRN
Start: 2020-12-25 — End: 2021-03-13

## 2020-12-25 NOTE — Addendum Note (Signed)
Addended by: Suzzanne Cloud E on: 12/25/2020 10:16 AM   Modules accepted: Orders

## 2020-12-25 NOTE — Patient Instructions (Signed)
I am glad you are tolerating the Ofev Continue the current dose We will check LFTs today Ordered Tessalon 200 twice daily as needed for cough  Follow-up in 3 months

## 2020-12-25 NOTE — Progress Notes (Addendum)
Bryan Tyler    [id number]    [birth year]  Primary Care Physician:Parker, Algis Greenhouse, MD  Referring Physician: Vivi Barrack, [address] [place],  [place] [address]  Chief complaint:  Follow-up for IPF Started Esbriet November 2021, stopped in January 2022 due to side effects Started Ofev in February 2022, dose reduced 200 mg due to diarrhea  HPI: 75 year old with history of pulmonary fibrosis, dyspnea, GERD, hypertension, hyperlipidemia Previously followed by Dr. Chase Caller.  Diagnosed with pulmonary fibrosis in 2019.  He was initially prescribed Ofev but denied by insurance Esbriet was then started.  He took this for 1 month and then stopped  the medication He was feeling well until December of this year when he had worsening.  Treated for pneumonia with multiple rounds of antibiotics and steroids with no improvement He is here for follow-up and reassessment.  Has significant dyspnea on exertion, cough with no mucus  Pets: Has a dog.  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  He has feather pillows which he has used for many years.  He got rid of it in November 2021 Smoking history: 20-pack-year smoker.  Quit in 1980 Travel history: Previously lived in [place].  No significant recent travel history Relevant family history: No family history of lung disease  Interim history: Esbriet switched to Ofev earlier this year.  He had to stop for a few weeks due to diarrhea and resume dose at 100 mg twice daily He is tolerating this dose well  Referred for transplant but denied due to presence of thoracic artery aneurysm He has been referred to rehab but prefers to start an exercise program at [facility] Encounter Medications as of 12/25/2020  Medication Sig   albuterol (VENTOLIN HFA) 108 (90 Base) MCG/ACT inhaler Inhale 2 puffs into the lungs every 6 (six) hours as needed for  wheezing or shortness of breath.   ARIPiprazole (ABILIFY) 2 MG tablet Take 1 tablet (2 mg total) by mouth daily.   atorvastatin (LIPITOR) 40 MG tablet Take 1 tablet (40 mg total) by mouth every evening.   clonazePAM (KLONOPIN) 1 MG tablet TAKE ONE AND ONE-HALF TABLETS AT BEDTIME AS NEEDED FOR ANXIETY (Patient taking differently: TAKE ONE TABLET AT BEDTIME AS NEEDED FOR ANXIETY)   gabapentin (NEURONTIN) 800 MG tablet Take 1 tablet (800 mg total) by mouth 2 (two) times daily.   levothyroxine (LEVOXYL) 112 MCG tablet Take 1 tablet (112 mcg total) by mouth daily.   Multiple Vitamin (MULTIVITAMIN WITH MINERALS) TABS tablet Take 1 tablet by mouth daily.   Naftifine HCl 2 % CREA Apply thin layer to affected area once daily for 2-3 weeks.   Nintedanib (OFEV) 100 MG CAPS Take 1 capsule (100 mg total) by mouth 2 (two) times daily.   omeprazole (PRILOSEC) 40 MG capsule Take 1 capsule (40 mg total) by mouth in the morning and at bedtime.   oxymetazoline (AFRIN) 0.05 % nasal spray Place 2 sprays into both nostrils 3 (three) times daily as needed for congestion.    tamsulosin (FLOMAX) 0.4 MG CAPS capsule Take 1 capsule (0.4 mg total) by mouth daily. (Patient taking differently: Take 0.4 mg by mouth every evening.)   venlafaxine XR (EFFEXOR-XR) 150 MG 24 hr capsule Take 2 capsules (300 mg total) by mouth daily.   guaiFENesin-codeine (ROBITUSSIN AC) 100-10 MG/5ML syrup Take 5 mLs by mouth 3 (three) times daily as  needed for cough. (Patient not taking: Reported on 12/25/2020)   [DISCONTINUED] albuterol (PROVENTIL HFA;VENTOLIN HFA) 108 (90 BASE) MCG/ACT inhaler Inhale 2 puffs into the lungs every 6 (six) hours as needed for wheezing.   No facility-administered encounter medications on file as of 12/25/2020.    Physical Exam: Blood pressure 138/80, pulse 90, temperature 97.9 F (36.6 C), temperature source Temporal, height 5\' 9"  (1.753 m), weight 187 lb 6.4 oz (85 kg), SpO2 96 %. Gen:      No acute distress HEENT:   EOMI, sclera anicteric Neck:     No masses; no thyromegaly Lungs:    Clear to auscultation bilaterally; normal respiratory effort CV:         Regular rate and rhythm; no murmurs Abd:      + bowel sounds; soft, non-tender; no palpable masses, no distension Ext:    No edema; adequate peripheral perfusion Skin:      Warm and dry; no rash Neuro: alert and oriented x 3 Psych: normal mood and affect  Data Reviewed: Imaging: CT high-resolution 05/02/2020-UIP pattern pulmonary fibrosis progressive since 2013 I have reviewed the images personally.  PFTs: 05/27/2018 FVC 3.08 [77%], FEV1 2.81 [97%], F/F 91  normal spirometry, restriction possible  07/11/2020 FVC 2.42 [62%], FEV1 2.19 [77%], F/F 91, TLC 3.43 [51%] Severe restriction.  Unable to complete diffusion capacity.   Labs: CTD serologies 04/29/2018-significant for elevated CCP at 47  CMP 08/14/2020-within normal limits N-terminal proBNP 07/12/2020-27  Cardiac: Echocardiogram 10/02/2020- LVEF 55-60%, normal PA systolic pressure, estimated RVSP 30.2.  RV is mildly dilated.  Assessment:  Progressive pulmonary fibrosis Likely has IPF given UIP pattern Elevated CCP is likely nonsignificant.  He does have prior exposure to feather pillows but CT scan is not consistent with hypersensitivity pneumonitis.    Has not tolerated Esbriet even at a lower dose due to brain fog, fatigue Now on Esbriet 800 mg [lower dose due to diarrhea] Echo reviewed with no significant pulmonary hypertension.  We will continue to monitor  He is about to start an exercise regimen at [facility] LFTs today  Chronic cough Likely secondary to pulmonary fibrosis and GERD He is on Prilosec twice daily, start Tessalon Continue Neurontin to 800 mg twice daily.  He is getting cough syrup with codeine from primary care   Plan/Recommendations: Continue Ofev Exercise therapy Check LFTs  Marshell Garfinkel MD [facility] 12/25/2020, 9:57  AM  CC: Vivi Barrack, MD

## 2020-12-26 DIAGNOSIS — H2513 Age-related nuclear cataract, bilateral: Secondary | ICD-10-CM | POA: Diagnosis not present

## 2021-01-10 ENCOUNTER — Ambulatory Visit: Payer: Medicare Other | Admitting: Family Medicine

## 2021-01-29 ENCOUNTER — Other Ambulatory Visit (INDEPENDENT_AMBULATORY_CARE_PROVIDER_SITE_OTHER): Payer: Medicare Other

## 2021-01-29 DIAGNOSIS — Z79899 Other long term (current) drug therapy: Secondary | ICD-10-CM

## 2021-01-29 LAB — HEPATIC FUNCTION PANEL
ALT: 20 U/L (ref 0–53)
AST: 26 U/L (ref 0–37)
Albumin: 4.2 g/dL (ref 3.5–5.2)
Alkaline Phosphatase: 75 U/L (ref 39–117)
Bilirubin, Direct: 0.1 mg/dL (ref 0.0–0.3)
Total Bilirubin: 0.8 mg/dL (ref 0.2–1.2)
Total Protein: 7.1 g/dL (ref 6.0–8.3)

## 2021-01-29 NOTE — Progress Notes (Signed)
Send letter that labs are normal

## 2021-01-30 ENCOUNTER — Encounter: Payer: Self-pay | Admitting: *Deleted

## 2021-01-31 ENCOUNTER — Ambulatory Visit: Payer: Medicare Other | Admitting: Family Medicine

## 2021-02-13 ENCOUNTER — Ambulatory Visit (INDEPENDENT_AMBULATORY_CARE_PROVIDER_SITE_OTHER): Payer: Medicare Other | Admitting: Family

## 2021-02-13 ENCOUNTER — Encounter: Payer: Self-pay | Admitting: Family

## 2021-02-13 ENCOUNTER — Other Ambulatory Visit: Payer: Self-pay

## 2021-02-13 VITALS — BP 133/83 | HR 89 | Temp 98.1°F | Ht 69.0 in | Wt 180.2 lb

## 2021-02-13 DIAGNOSIS — M21621 Bunionette of right foot: Secondary | ICD-10-CM

## 2021-02-13 DIAGNOSIS — M79673 Pain in unspecified foot: Secondary | ICD-10-CM | POA: Diagnosis not present

## 2021-02-13 NOTE — Progress Notes (Signed)
Acute Office Visit  Subjective:    Patient ID: Bryan Tyler, male    DOB: May 09, 1946, 75 y.o.   MRN: [id number]  Chief Complaint  Patient presents with  . Referral    Podiatrist     HPI Patient is in today for a referral to podiatry.  Patient reports that he has had right foot pain ongoing for several months.  Saw sports medicine doctor who suggested he cut a hole in his shoe for relief.  However, he would like to have additional intervention to repair this issue.  Reports of burning pain that is worse when he has on close toed shoes.  Patient reports that he likes to go to the gym and and is unable to work out with open toe shoes.  He has lost 18 pounds so far.  Past Medical History:  Diagnosis Date  . Adenocarcinoma of prostate ([facility]) 04/16/2013  . Aneurysm, aorta, thoracic ([place])   . Anxiety   . Arthritis    "knees" (05/21/2017)  . Depression   . Diverticulosis   . Dyspnea    on exertion for a long period time  . GERD (gastroesophageal reflux disease)   . Hyperlipidemia   . Hypertension   . Hypothyroidism, postradioiodine therapy    1980's  . Nocturia   . Organic impotence   . OSA (obstructive sleep apnea)    NO CPAP--  S/P SURGERY  2002  . Osteoporosis   . Pneumonia 1970  . PONV (postoperative nausea and vomiting)   . Urge urinary incontinence   . Wears contact lenses     Past Surgical History:  Procedure Laterality Date  . HEMORRHOIDECTOMY WITH HEMORRHOID BANDING  2007  . INGUINAL HERNIA REPAIR Bilateral    x4  (2 each side)  . INSERTION PROSTATE RADIATION SEED    . KNEE ARTHROSCOPY Right 2012  . NASAL SEPTUM SURGERY  1982   w/ rhinoplasty  . PROSTATE BIOPSY    . RADIOACTIVE SEED IMPLANT N/A 05/30/2013   Procedure: RADIOACTIVE SEED IMPLANT;  Surgeon: Hanley Ben, MD;  Location: [facility];  Service: Urology;  Laterality: N/A;  . REVERSE SHOULDER ARTHROPLASTY Right 05/21/2017  . REVERSE SHOULDER ARTHROPLASTY Right 05/21/2017  .  REVERSE SHOULDER ARTHROPLASTY Right 05/21/2017   Procedure: REVERSE RIGHT SHOULDER ARTHROPLASTY;  Surgeon: Justice Britain, MD;  Location: [facility];  Service: Orthopedics;  Laterality: Right;  . RHINOPLASTY  1982   w/w/ septoplasty  . SHOULDER ARTHROSCOPY W/ ROTATOR CUFF REPAIR Right 2004  . SHOULDER ARTHROSCOPY W/ ROTATOR CUFF REPAIR Left 2016  . TONSILLECTOMY  AS CHILD  . TOTAL KNEE ARTHROPLASTY Right 09/24/2018   Procedure: TOTAL KNEE ARTHROPLASTY;  Surgeon: Sydnee Cabal, MD;  Location: [facility];  Service: Orthopedics;  Laterality: Right;  Adductor Block  . UVULOPALATOPHARYNGOPLASTY  2002    Family History  Problem Relation Age of Onset  . Cancer Mother        Pancreatic Cancer  . Heart disease Father 14       CABG    Social History   Socioeconomic History  . Marital status: Married    Spouse name: Not on file  . Number of children: Not on file  . Years of education: Not on file  . Highest education level: Not on file  Occupational History  . Occupation: Patent examiner  Tobacco Use  . Smoking status: Former    Packs/day: 1.00    Years: 10.00    Pack years: 10.00  Types: Cigarettes    Quit date: 08/11/1978    Years since quitting: 42.5  . Smokeless tobacco: Never  Vaping Use  . Vaping Use: Never used  Substance and Sexual Activity  . Alcohol use: Yes    Comment: 05/21/2017 "1-2 beers/month"  . Drug use: No  . Sexual activity: Not Currently  Other Topics Concern  . Not on file  Social History Narrative  . Not on file   Social Determinants of Health   Financial Resource Strain: Not on file  Food Insecurity: Not on file  Transportation Needs: Not on file  Physical Activity: Not on file  Stress: Not on file  Social Connections: Not on file  Intimate Partner Violence: Not on file    Outpatient Medications Prior to Visit  Medication Sig Dispense Refill  . albuterol (VENTOLIN HFA) 108 (90 Base) MCG/ACT inhaler Inhale 2 puffs into the lungs every 6 (six)  hours as needed for wheezing or shortness of breath. 18 g 2  . ARIPiprazole (ABILIFY) 2 MG tablet Take 1 tablet (2 mg total) by mouth daily. 90 tablet 1  . atorvastatin (LIPITOR) 40 MG tablet Take 1 tablet (40 mg total) by mouth every evening. 90 tablet 3  . clonazePAM (KLONOPIN) 1 MG tablet TAKE ONE AND ONE-HALF TABLETS AT BEDTIME AS NEEDED FOR ANXIETY (Patient taking differently: TAKE ONE TABLET AT BEDTIME AS NEEDED FOR ANXIETY) 135 tablet 1  . gabapentin (NEURONTIN) 800 MG tablet Take 1 tablet (800 mg total) by mouth 2 (two) times daily. 180 tablet 1  . levothyroxine (LEVOXYL) 112 MCG tablet Take 1 tablet (112 mcg total) by mouth daily. 90 tablet 3  . Multiple Vitamin (MULTIVITAMIN WITH MINERALS) TABS tablet Take 1 tablet by mouth daily.    . Naftifine HCl 2 % CREA Apply thin layer to affected area once daily for 2-3 weeks. 45 g 0  . Nintedanib (OFEV) 100 MG CAPS Take 1 capsule (100 mg total) by mouth 2 (two) times daily. 60 capsule 5  . omeprazole (PRILOSEC) 40 MG capsule Take 1 capsule (40 mg total) by mouth in the morning and at bedtime. 90 capsule 3  . oxymetazoline (AFRIN) 0.05 % nasal spray Place 2 sprays into both nostrils 3 (three) times daily as needed for congestion.     . tamsulosin (FLOMAX) 0.4 MG CAPS capsule Take 1 capsule (0.4 mg total) by mouth daily. (Patient taking differently: Take 0.4 mg by mouth every evening.) 30 capsule 3  . venlafaxine XR (EFFEXOR-XR) 150 MG 24 hr capsule Take 2 capsules (300 mg total) by mouth daily. 180 capsule 1  . benzonatate (TESSALON) 200 MG capsule Take 1 capsule (200 mg total) by mouth 2 (two) times daily as needed for cough. (Patient not taking: Reported on 02/13/2021) 60 capsule 2  . guaiFENesin-codeine (ROBITUSSIN AC) 100-10 MG/5ML syrup Take 5 mLs by mouth 3 (three) times daily as needed for cough. 120 mL 1   No facility-administered medications prior to visit.    Allergies  Allergen Reactions  . Codeine Nausea And Vomiting and Other (See  Comments)    DIZZINESS  . Amoxicillin-Pot Clavulanate   . Ciprofloxacin Other (See Comments)    Unknown  . Quinolones     Patient was warned about not using quinolones. Recent studies have raised concern that fluoroquinolone antibiotics could be associated with an increased risk of aortic aneurysm Fluoroquinolones have non-antimicrobial properties that might jeopardise the integrity of the extracellular matrix of the vascular wallthere was a 66% increased rate of  aortic aneurysm or dissection associated with oral fluoroquinolone use, compared with amoxicillin use, within a 60 day risk period from start of treatment       Review of Systems  Constitutional: Negative.   Respiratory: Negative.    Cardiovascular: Negative.   Gastrointestinal: Negative.   Musculoskeletal:        Right foot pain  Skin: Negative.   Allergic/Immunologic: Negative.   Neurological: Negative.   Hematological: Negative.   Psychiatric/Behavioral: Negative.    All other systems reviewed and are negative.     Objective:    Physical Exam Vitals and nursing note reviewed.  Constitutional:      Appearance: Normal appearance.  Cardiovascular:     Rate and Rhythm: Normal rate and regular rhythm.  Pulmonary:     Effort: Pulmonary effort is normal.     Breath sounds: Normal breath sounds.  Musculoskeletal:     Cervical back: Normal range of motion and neck supple.       Feet:  Skin:    General: Skin is warm and dry.  Neurological:     General: No focal deficit present.     Mental Status: He is alert and oriented to person, place, and time.  Psychiatric:        Mood and Affect: Mood normal.        Behavior: Behavior normal.   BP 133/83   Pulse 89   Temp 98.1 F (36.7 C) (Temporal)   Ht 5\' 9"  (1.753 m)   Wt 180 lb 3.2 oz (81.7 kg)   SpO2 100%   BMI 26.61 kg/m  Wt Readings from Last 3 Encounters:  02/13/21 180 lb 3.2 oz (81.7 kg)  12/25/20 187 lb 6.4 oz (85 kg)  11/29/20 190 lb 9.6 oz (86.5 kg)     Health Maintenance Due  Topic Date Due  . Zoster Vaccines- Shingrix (1 of 2) Never done  . COVID-19 Vaccine (4 - Booster for Pfizer series) 08/06/2020    There are no preventive care reminders to display for this patient.   Lab Results  Component Value Date   TSH 2.82 05/25/2020   Lab Results  Component Value Date   WBC 9.5 05/25/2020   HGB 15.0 05/25/2020   HCT 44.4 05/25/2020   MCV 91.5 05/25/2020   PLT 309 05/25/2020   Lab Results  Component Value Date   NA 136 08/14/2020   K 4.3 08/14/2020   CO2 30 08/14/2020   GLUCOSE 102 (H) 08/14/2020   BUN 14 08/14/2020   CREATININE 0.90 08/14/2020   BILITOT 0.8 01/29/2021   ALKPHOS 75 01/29/2021   AST 26 01/29/2021   ALT 20 01/29/2021   PROT 7.1 01/29/2021   ALBUMIN 4.2 01/29/2021   CALCIUM 9.4 08/14/2020   ANIONGAP 6 09/25/2018   GFR 83.32 08/14/2020   Lab Results  Component Value Date   CHOL 163 05/25/2020   Lab Results  Component Value Date   HDL 63 05/25/2020   Lab Results  Component Value Date   LDLCALC 70 05/25/2020   Lab Results  Component Value Date   TRIG 207 (H) 05/25/2020   Lab Results  Component Value Date   CHOLHDL 2.6 05/25/2020   Lab Results  Component Value Date   HGBA1C 6.0 (H) 05/25/2020       Assessment & Plan:   Problem List Items Addressed This Visit     Foot pain - Primary   Relevant Orders   Ambulatory referral to Podiatry   Bunionette of  right foot   Relevant Orders   Ambulatory referral to Podiatry   Plan: Podiatry referral placed.  Follow-up as scheduled and as needed.  No orders of the defined types were placed in this encounter.    Kennyth Arnold, FNP

## 2021-02-22 ENCOUNTER — Ambulatory Visit: Payer: Medicare Other | Admitting: Podiatry

## 2021-02-22 ENCOUNTER — Ambulatory Visit (INDEPENDENT_AMBULATORY_CARE_PROVIDER_SITE_OTHER): Payer: Medicare Other | Admitting: Podiatry

## 2021-02-22 ENCOUNTER — Encounter: Payer: Self-pay | Admitting: Podiatry

## 2021-02-22 ENCOUNTER — Ambulatory Visit (INDEPENDENT_AMBULATORY_CARE_PROVIDER_SITE_OTHER): Payer: Medicare Other

## 2021-02-22 ENCOUNTER — Other Ambulatory Visit: Payer: Self-pay

## 2021-02-22 DIAGNOSIS — M778 Other enthesopathies, not elsewhere classified: Secondary | ICD-10-CM | POA: Diagnosis not present

## 2021-02-22 DIAGNOSIS — M21619 Bunion of unspecified foot: Secondary | ICD-10-CM | POA: Diagnosis not present

## 2021-02-22 DIAGNOSIS — Q828 Other specified congenital malformations of skin: Secondary | ICD-10-CM

## 2021-02-22 DIAGNOSIS — M21611 Bunion of right foot: Secondary | ICD-10-CM | POA: Diagnosis not present

## 2021-02-22 MED ORDER — TRIAMCINOLONE ACETONIDE 10 MG/ML IJ SUSP
10.0000 mg | Freq: Once | INTRAMUSCULAR | Status: AC
  Administered 2021-02-22: 10 mg

## 2021-02-22 NOTE — Progress Notes (Signed)
Subjective:   Patient ID: Bryan Tyler, male   DOB: 75 y.o.   MRN: [id number]   HPI Patient presents stating he is developed a lot of pain on the outside of his right foot with fluid buildup and feels like there is a lesion there that burns.  States been hurting for around 6 months and he needs to be active and patient does not smoke and is trying to be active   Review of Systems  All other systems reviewed and are negative.      Objective:  Physical Exam Vitals and nursing note reviewed.  Constitutional:      Appearance: He is well-developed.  Pulmonary:     Effort: Pulmonary effort is normal.  Musculoskeletal:        General: Normal range of motion.  Skin:    General: Skin is warm.  Neurological:     Mental Status: He is alert.    Neurovascular status intact muscle strength found to be adequate range of motion adequate.  Patient has a inflammation with fluid around the fifth MPJ right with lesion formation also present painful when pressed and has no other significant pathology currently in     Assessment:  Laboratory capsulitis with tailor's bunion deformity fifth MPJ right and lesion formation     Plan:  H&P reviewed condition sterile prep injected the fifth MPJ 3 mg Dexasone Kenalog 5 mg Xylocaine debrided lesion discussed possible metatarsal head resection at 1 point in future  X-ray indicates it is around the head of the metatarsal right no indications of arthritic pathology

## 2021-02-28 ENCOUNTER — Ambulatory Visit: Payer: Medicare Other | Admitting: Family Medicine

## 2021-03-01 ENCOUNTER — Other Ambulatory Visit: Payer: Self-pay | Admitting: Pharmacist

## 2021-03-01 DIAGNOSIS — Z5181 Encounter for therapeutic drug level monitoring: Secondary | ICD-10-CM

## 2021-03-01 NOTE — Progress Notes (Signed)
Patient is due for hepatic function panel and CBC. Takes Ofev '100mg'$  twice daily. Future lab orders placed for both today  Sent MyChart message to patient to advise  Knox Saliva, PharmD, MPH, BCPS Clinical Pharmacist (Rheumatology and Pulmonology)

## 2021-03-07 ENCOUNTER — Other Ambulatory Visit (INDEPENDENT_AMBULATORY_CARE_PROVIDER_SITE_OTHER): Payer: Medicare Other

## 2021-03-07 DIAGNOSIS — Z5181 Encounter for therapeutic drug level monitoring: Secondary | ICD-10-CM | POA: Diagnosis not present

## 2021-03-07 LAB — HEPATIC FUNCTION PANEL
ALT: 24 U/L (ref 0–53)
AST: 28 U/L (ref 0–37)
Albumin: 4.2 g/dL (ref 3.5–5.2)
Alkaline Phosphatase: 68 U/L (ref 39–117)
Bilirubin, Direct: 0.1 mg/dL (ref 0.0–0.3)
Total Bilirubin: 0.8 mg/dL (ref 0.2–1.2)
Total Protein: 7.2 g/dL (ref 6.0–8.3)

## 2021-03-07 LAB — CBC WITH DIFFERENTIAL/PLATELET
Basophils Absolute: 0.1 10*3/uL (ref 0.0–0.1)
Basophils Relative: 0.8 % (ref 0.0–3.0)
Eosinophils Absolute: 0.2 10*3/uL (ref 0.0–0.7)
Eosinophils Relative: 3.1 % (ref 0.0–5.0)
HCT: 43.2 % (ref 39.0–52.0)
Hemoglobin: 13.8 g/dL (ref 13.0–17.0)
Lymphocytes Relative: 31.9 % (ref 12.0–46.0)
Lymphs Abs: 2.5 10*3/uL (ref 0.7–4.0)
MCHC: 32 g/dL (ref 30.0–36.0)
MCV: 91.8 fl (ref 78.0–100.0)
Monocytes Absolute: 0.9 10*3/uL (ref 0.1–1.0)
Monocytes Relative: 11 % (ref 3.0–12.0)
Neutro Abs: 4.1 10*3/uL (ref 1.4–7.7)
Neutrophils Relative %: 53.2 % (ref 43.0–77.0)
Platelets: 253 10*3/uL (ref 150.0–400.0)
RBC: 4.71 Mil/uL (ref 4.22–5.81)
RDW: 14.7 % (ref 11.5–15.5)
WBC: 7.8 10*3/uL (ref 4.0–10.5)

## 2021-03-13 ENCOUNTER — Encounter: Payer: Self-pay | Admitting: Pulmonary Disease

## 2021-03-13 ENCOUNTER — Other Ambulatory Visit: Payer: Self-pay

## 2021-03-13 ENCOUNTER — Ambulatory Visit (INDEPENDENT_AMBULATORY_CARE_PROVIDER_SITE_OTHER): Payer: Medicare Other | Admitting: Pulmonary Disease

## 2021-03-13 VITALS — BP 148/78 | HR 85 | Ht 69.0 in | Wt 182.2 lb

## 2021-03-13 DIAGNOSIS — J849 Interstitial pulmonary disease, unspecified: Secondary | ICD-10-CM | POA: Diagnosis not present

## 2021-03-13 DIAGNOSIS — Z5181 Encounter for therapeutic drug level monitoring: Secondary | ICD-10-CM | POA: Diagnosis not present

## 2021-03-13 DIAGNOSIS — J84112 Idiopathic pulmonary fibrosis: Secondary | ICD-10-CM

## 2021-03-13 MED ORDER — BENZONATATE 200 MG PO CAPS: 200.0000 mg | ORAL_CAPSULE | Freq: Two times a day (BID) | ORAL | 5 refills | Status: DC | PRN

## 2021-03-13 NOTE — Patient Instructions (Signed)
Glad you are doing well with regard to your breathing Continue the exercise regimen We sent in an order for Tessalon 200 mg twice daily with 3 refills to [facility]  Your labs look stable which is good news Continue Ofev  We will order high-resolution CT and PFTs for follow-up in 3 to 4 months Return to clinic in December after these tests.

## 2021-03-13 NOTE — Progress Notes (Signed)
Bryan Tyler    AG:[id number]    Oct 13, 1945  Primary Care Physician:Parker, Algis Greenhouse, MD  Referring Physician: Vivi Tyler, [place],  [place] [id number]  Chief complaint:  Follow-up for IPF Started Esbriet November 2021, stopped in January 2022 due to side effects Started Ofev in February 2022, dose reduced 100 mg due to diarrhea  HPI: 75 year old with history of pulmonary fibrosis, dyspnea, GERD, hypertension, hyperlipidemia Previously followed by Dr. Chase Tyler.  Diagnosed with pulmonary fibrosis in 2019.  He was initially prescribed Ofev but denied by insurance Esbriet was then started.  He took this for 1 month and then stopped  the medication  He was reevaluated 2021 for worsening dyspnea with progressive pulmonary fibrosis on CT scan Started on antifibrotic's. Did not tolerate Esbriet due to side effects Started Ofev in February 2022  [facility] but denied due to presence of thoracic artery aneurysm  Pets: Has a dog.  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  He has feather pillows which he has used for many years.  He got rid of it in November 2021 Smoking history: 20-pack-year smoker.  Quit in 1980 Travel history: Previously lived in [place].  No significant recent travel history Relevant family history: No family history of lung disease  Interim history: Continues on Ofev at 100 mg Continues to have diarrhea but is tolerable He is taking Imodium to help with this  Started exercise program at [facility] with improvement in exercise capacity.  He has lost some weight as well  Outpatient Encounter Medications as of 03/13/2021  Medication Sig   albuterol (VENTOLIN HFA) 108 (90 Base) MCG/ACT inhaler Inhale 2 puffs into the lungs every 6 (six) hours as needed for wheezing or shortness of breath.   ARIPiprazole (ABILIFY) 2 MG tablet Take  1 tablet (2 mg total) by mouth daily.   atorvastatin (LIPITOR) 40 MG tablet Take 1 tablet (40 mg total) by mouth every evening.   benzonatate (TESSALON) 200 MG capsule Take 1 capsule (200 mg total) by mouth 2 (two) times daily as needed for cough.   clonazePAM (KLONOPIN) 1 MG tablet TAKE ONE AND ONE-HALF TABLETS AT BEDTIME AS NEEDED FOR ANXIETY (Patient taking differently: TAKE ONE TABLET AT BEDTIME AS NEEDED FOR ANXIETY)   gabapentin (NEURONTIN) 800 MG tablet Take 1 tablet (800 mg total) by mouth 2 (two) times daily.   levothyroxine (LEVOXYL) 112 MCG tablet Take 1 tablet (112 mcg total) by mouth daily.   Multiple Vitamin (MULTIVITAMIN WITH MINERALS) TABS tablet Take 1 tablet by mouth daily.   Naftifine HCl 2 % CREA Apply thin layer to affected area once daily for 2-3 weeks.   Nintedanib (OFEV) 100 MG CAPS Take 1 capsule (100 mg total) by mouth 2 (two) times daily.   omeprazole (PRILOSEC) 40 MG capsule Take 1 capsule (40 mg total) by mouth in the morning and at bedtime.   oxymetazoline (AFRIN) 0.05 % nasal spray Place 2 sprays into both nostrils 3 (three) times daily as needed for congestion.    tamsulosin (FLOMAX) 0.4 MG CAPS capsule Take 1 capsule (0.4 mg total) by mouth daily. (Patient taking differently: Take 0.4 mg by mouth every evening.)   venlafaxine XR (EFFEXOR-XR) 150 MG 24 hr capsule Take 2 capsules (300 mg total) by mouth daily.   No facility-administered encounter medications on file as of 03/13/2021.  Physical Exam: Blood pressure (!) 148/78, pulse 85, height '5\' 9"'$  (1.753 m), weight 182 lb 3.2 oz (82.6 kg), SpO2 96 %. Gen:      No acute distress HEENT:  EOMI, sclera anicteric Neck:     No masses; no thyromegaly Lungs:    Bibasal crackles CV:         Regular rate and rhythm; no murmurs Abd:      + bowel sounds; soft, non-tender; no palpable masses, no distension Ext:    No edema; adequate peripheral perfusion Skin:      Warm and dry; no rash Neuro: alert and oriented x  3 Psych: normal mood and affect   Data Reviewed: Imaging: CT high-resolution 05/02/2020-UIP pattern pulmonary fibrosis progressive since 2013 I have reviewed the images personally.  PFTs: 05/27/2018 FVC 3.08 [77%], FEV1 2.81 [97%], F/F 91  normal spirometry, restriction possible  07/11/2020 FVC 2.42 [62%], FEV1 2.19 [77%], F/F 91, TLC 3.43 [51%] Severe restriction.  Unable to complete diffusion capacity.   Labs: Hepatic panel 03/07/2021-within normal limits CBC 03/07/2021-normal  CMP 08/14/2020-within normal limits N-terminal proBNP 07/12/2020-27  Cardiac: Echocardiogram 10/02/2020- LVEF 55-60%, normal PA systolic pressure, estimated RVSP 30.2.  RV is mildly dilated.  Assessment:  Progressive pulmonary fibrosis Likely has IPF given UIP pattern Elevated CCP is likely nonsignificant.  He does have prior exposure to feather pillows but CT scan is not consistent with hypersensitivity pneumonitis.    Has not tolerated Esbriet even at a lower dose due to brain fog, fatigue Now on Ofev 100 mg [lower dose due to diarrhea] Echo reviewed with no significant pulmonary hypertension.  We will continue to monitor  Continue exercise program at [facility] will He has stable LFTs and blood counts  Chronic cough Likely secondary to pulmonary fibrosis and GERD He is on Prilosec twice daily, and Tessalon Continue Neurontin to 800 mg twice daily.  He would like to avoid codeine cough syrup  Plan/Recommendations: Continue Ofev Exercise therapy Follow-up CT high-resolution and PFTs in 3 months  Bryan Garfinkel MD [facility] 03/13/2021, 10:09 AM  CC: Bryan Barrack, MD

## 2021-03-20 ENCOUNTER — Encounter: Payer: Self-pay | Admitting: Psychiatry

## 2021-03-20 ENCOUNTER — Other Ambulatory Visit: Payer: Self-pay

## 2021-03-20 ENCOUNTER — Ambulatory Visit (INDEPENDENT_AMBULATORY_CARE_PROVIDER_SITE_OTHER): Payer: Medicare Other | Admitting: Psychiatry

## 2021-03-20 DIAGNOSIS — F5105 Insomnia due to other mental disorder: Secondary | ICD-10-CM

## 2021-03-20 DIAGNOSIS — F331 Major depressive disorder, recurrent, moderate: Secondary | ICD-10-CM | POA: Diagnosis not present

## 2021-03-20 DIAGNOSIS — F3342 Major depressive disorder, recurrent, in full remission: Secondary | ICD-10-CM | POA: Diagnosis not present

## 2021-03-20 DIAGNOSIS — R251 Tremor, unspecified: Secondary | ICD-10-CM

## 2021-03-20 DIAGNOSIS — F411 Generalized anxiety disorder: Secondary | ICD-10-CM

## 2021-03-20 MED ORDER — VENLAFAXINE HCL ER 150 MG PO CP24: 300.0000 mg | ORAL_CAPSULE | Freq: Every day | ORAL | 1 refills | Status: DC

## 2021-03-20 MED ORDER — CLONAZEPAM 1 MG PO TABS: ORAL_TABLET | ORAL | 1 refills | Status: DC

## 2021-03-20 MED ORDER — ARIPIPRAZOLE 2 MG PO TABS: 2.0000 mg | ORAL_TABLET | Freq: Every day | ORAL | 1 refills | Status: DC

## 2021-03-20 NOTE — Progress Notes (Signed)
Bryan Tyler [id number] 1945/11/11 75 y.o.  Subjective:   Patient ID:  Bryan Tyler is a 75 y.o. (DOB 03/24/1946) male.  Chief Complaint:  Chief Complaint  Patient presents with   Follow-up   Depression   Anxiety   Sleeping Problem    HPI Bryan Tyler presents to the office today for follow-up of history of major depression and anxiety and insomnia.  seen June 2020.  The only medication change was to reduce Abilify from 5 mg to 2 mg daily to see if tremor would improve.  He had residual anxiety but not much depression at the time.  Patient called April 22, 2019 asking for refill of clonazepam early indicating he had increased the dosages on his own to 1.5 mg instead of 1 mg.  He was informed he could not change the dosage of a controlled substance without our permission.  seen December 2020.  The following was noted: Tremor resolved with reduction in Abilify.  Wants to go through meds and their purpose. No history of RLS.   Ran out of Klonopin lately and only sleeping 3-4  Hours.  Usually sleeps oK with 1 mg nightly. Overall anxiety and depression and sleep are managed.  Because of polypharmacy we elected to try to discontinue buspirone and he was to let us know if he had any rebound anxiety.  December 07, 2019 appointment the following is noted: More trouble with sleep and Klonopin 1 mg no longer getting him to sleep and will take another 1/2 tablet.  Then runs out of the Klonopin early. No SE from 1.5 mg Klonopin. Sleep 7 hours nightly.  45 nap in day.   2 cups coffee AM, not daily other caffeine but sometimes tea with dinner.   Work 3 days week. Plan: No med changes  06/05/2020 appointment with the following noted: Rough physically, pneumonia July and then dx pulm fibrosis.  Feels not well.  No Covid.  Not smoker. Stayed on Effexor XR 300, Abilify 2 mg  And clonazepam 1.0-1.5 mg HS. Plan: He wonders about med change to try to help with the recurrent depression.   His depression had been in remission on Abilify 5 mg and then 2 mg daily. The most logical alternative would be switching from Abilify to Rexulti 1 mg for 1 week and then if no response increase to 2 mg.  He had tremors on 5 mg of Abilify so going up and Abilify is not a good option.  08/01/2020 appointment with the following noted: He called since being here indicating the [place] was making him too tired.  He was offered the option of reducing the dose or returning to Abilify. He thinks he returned to Abilify but not certain. Function OK but thinks he's still depressed.    Pulmonary fibrosis dx and RX Esbriet and it makes him feel out of it. Plan: Reduce Venlafaxine to 3 of the 75 mg capsules and add 30 mg duloxetine for 1 week, Then reduce venlafaxine to 2 of the 75 mg capsules and increase duloxetine to 2 capsules daily for 1 week, Then reduce venlafaxine to one of the 75 mg capsules and increase duloxetine to 3 capsules daily for 1 week, Then stop venlafaxine and continue 3 duloxetine daily  09/17/2020 appointment urgently scheduled by the patient: Got confused and didn't make the change to duloxetine.  Didn't know if he was supposed to take both at the same time.  Had brain fog from new med for pulmonary fibrosis  E[place]rit and that compounded difficulty understand what to do and stopped that med too.    He doesn't want to take a change at this time.  Not necessary at this time. Plan: no med changes  11/15/2020 appt with following noted: No SE with meds except for Pulm fibrosis. No SOB if not exerting. No SE with psych meds.   Stress cousin died with debt and he's going to be the Art therapist. Depression is pretty good and  manageable.  Anxiety is residual with stress.   Pt reports couldn't go to sleep before clonazepam and when ran out. Been able to sleep with 1 mg clonazepam.  Pt reports that appetite is good. Pt reports that energy is less good.  Concentration is better than last visit.  Suicidal thoughts:  denied by patient. Plan: No indication for med change Continue venlafaxine XR 300 mg daily Abillify 2 Clonazepam 1 HS  03/20/2021 appointment with the following noted: Fairly well.  Still gets dep if idle but if busy he's OK.  Comes and goes. Anxiety is not a problem..  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 Sees th e value of each meds.  Asks if there's a prn for depressed days which there isn't   Past Psychiatric Medication Trials: Effexor 300, Abilify 5 started 2017,  Rexulti 2 mg tired  Buspirone 15 BID, Clonazepam 1 &1/2 mg nightly Gabapentin 800 nightly Belsomra Lorazepam Mirtazapine 30 Trazodone 150  Review of Systems:  Review of Systems  Respiratory:  Positive for shortness of breath.   Cardiovascular:  Negative for chest pain and palpitations.       Enlarged aorta with follow-up next month.  Neurological:  Negative for dizziness, tremors, seizures and weakness. Neuro said no Parkinson's dz.  Medications: I have reviewed the patient's current medications.  Current Outpatient Medications  Medication Sig Dispense Refill   albuterol (VENTOLIN HFA) 108 (90 Base) MCG/ACT inhaler Inhale 2 puffs into the lungs every 6 (six) hours as needed for wheezing or shortness of breath. 18 g 2   atorvastatin (LIPITOR) 40 MG tablet Take 1 tablet (40 mg total) by mouth every evening. 90 tablet 3   gabapentin (NEURONTIN) 800 MG tablet Take 1 tablet (800 mg total) by mouth 2 (two) times daily. 180 tablet 1   levothyroxine (LEVOXYL) 112 MCG tablet Take 1 tablet (112 mcg total) by mouth daily. 90 tablet 3   Multiple Vitamin (MULTIVITAMIN WITH MINERALS) TABS tablet Take 1 tablet by mouth daily.     Nintedanib (OFEV) 100 MG CAPS Take 1 capsule (100 mg total) by mouth 2 (two) times daily. 60 capsule 5   omeprazole (PRILOSEC) 40 MG capsule Take 1 capsule (40 mg total) by mouth in the morning and at bedtime. 90 capsule 3   oxymetazoline  (AFRIN) 0.05 % nasal spray Place 2 sprays into both nostrils 3 (three) times daily as needed for congestion.      tamsulosin (FLOMAX) 0.4 MG CAPS capsule Take 1 capsule (0.4 mg total) by mouth daily. (Patient taking differently: Take 0.4 mg by mouth every evening.) 30 capsule 3   ARIPiprazole (ABILIFY) 2 MG tablet Take 1 tablet (2 mg total) by mouth daily. 90 tablet 1   benzonatate (TESSALON) 200 MG capsule Take 1 capsule (200 mg total) by mouth 2 (two) times daily as needed for cough. (Patient not taking: Reported on 03/20/2021) 60 capsule 5   clonazePAM (KLONOPIN) 1 MG tablet TAKE ONE TABLET AT  BEDTIME AS NEEDED FOR ANXIETY 90 tablet 1   Naftifine HCl 2 % CREA Apply thin layer to affected area once daily for 2-3 weeks. (Patient not taking: Reported on 03/20/2021) 45 g 0   venlafaxine XR (EFFEXOR-XR) 150 MG 24 hr capsule Take 2 capsules (300 mg total) by mouth daily. 180 capsule 1   No current facility-administered medications for this visit.    Medication Side Effects: Other: brief dizzy when stands up  Allergies:  Allergies  Allergen Reactions   Codeine Nausea And Vomiting and Other (See Comments)    DIZZINESS   Amoxicillin-Pot Clavulanate    Ciprofloxacin Other (See Comments)    Unknown   Quinolones     Patient was warned about not using quinolones. Recent studies have raised concern that fluoroquinolone antibiotics could be associated with an increased risk of aortic aneurysm Fluoroquinolones have non-antimicrobial properties that might jeopardise the integrity of the extracellular matrix of the vascular wallthere was a 66% increased rate of aortic aneurysm or dissection associated with oral fluoroquinolone use, compared with amoxicillin use, within a 60 day risk period from start of treatment       Past Medical History:  Diagnosis Date   Adenocarcinoma of prostate ([place]) 04/16/2013   Aneurysm, aorta, thoracic ([place])    Anxiety    Arthritis    "knees" (05/21/2017)   Depression     Diverticulosis    Dyspnea    on exertion for a long period time   GERD (gastroesophageal reflux disease)    Hyperlipidemia    Hypertension    Hypothyroidism, postradioiodine therapy    1980's   Nocturia    Organic impotence    OSA (obstructive sleep apnea)    NO CPAP--  S/P SURGERY  2002   Osteoporosis    Pneumonia 1970   PONV (postoperative nausea and vomiting)    Urge urinary incontinence    Wears contact lenses     Family History  Problem Relation Age of Onset   Cancer Mother        Pancreatic Cancer   Heart disease Father 68       CABG    Social History   Socioeconomic History   Marital status: Married    Spouse name: Not on file   Number of children: Not on file   Years of education: Not on file   Highest education level: Not on file  Occupational History   Occupation: Middle Education officer, museum  Tobacco Use   Smoking status: Former    Packs/day: 1.00    Years: 10.00    Pack years: 10.00    Types: Cigarettes    Quit date: 08/11/1978    Years since quitting: 42.6   Smokeless tobacco: Never  Vaping Use   Vaping Use: Never used  Substance and Sexual Activity   Alcohol use: Yes    Comment: 05/21/2017 "1-2 beers/month"   Drug use: No   Sexual activity: Not Currently  Other Topics Concern   Not on file  Social History Narrative   Not on file   Social Determinants of Health   Financial Resource Strain: Not on file  Food Insecurity: Not on file  Transportation Needs: Not on file  Physical Activity: Not on file  Stress: Not on file  Social Connections: Not on file  Intimate Partner Violence: Not on file    Past Medical History, Surgical history, Social history, and Family history were reviewed and updated as appropriate.   Please see review of systems for  further details on the patient's review from today.   Objective:   Physical Exam:  There were no vitals taken for this visit.  Physical Exam Constitutional:      General: He is not in acute  distress. Musculoskeletal:        General: No deformity.  Neurological:     Mental Status: He is alert and oriented to person, place, and time.     Coordination: Coordination normal.     Comments: Tremor minimal  Psychiatric:        Attention and Perception: Attention and perception normal. He does not perceive auditory or visual hallucinations.        Mood and Affect: Mood is anxious and depressed. Affect is not labile, blunt, angry or inappropriate.        Speech: Speech normal.        Behavior: Behavior normal.        Thought Content: Thought content normal. Thought content does not include homicidal or suicidal ideation. Thought content does not include homicidal or suicidal plan.        Cognition and Memory: Cognition and memory normal.        Judgment: Judgment normal.     Comments: Insight intact. No delusions.  Residual managed anxiety and depression.    Lab Review:     Component Value Date/Time   NA 136 08/14/2020 1238   NA 137 11/16/2019 1001   K 4.3 08/14/2020 1238   CL 99 08/14/2020 1238   CO2 30 08/14/2020 1238   GLUCOSE 102 (H) 08/14/2020 1238   BUN 14 08/14/2020 1238   BUN 12 11/16/2019 1001   CREATININE 0.90 08/14/2020 1238   CREATININE 0.86 05/25/2020 0959   CALCIUM 9.4 08/14/2020 1238   CALCIUM 9.8 07/09/2011 0819   PROT 7.2 03/07/2021 1303   ALBUMIN 4.2 03/07/2021 1303   AST 28 03/07/2021 1303   ALT 24 03/07/2021 1303   ALKPHOS 68 03/07/2021 1303   BILITOT 0.8 03/07/2021 1303   GFRNONAA 85 11/16/2019 1001   GFRAA 98 11/16/2019 1001       Component Value Date/Time   WBC 7.8 03/07/2021 1303   RBC 4.71 03/07/2021 1303   HGB 13.8 03/07/2021 1303   HCT 43.2 03/07/2021 1303   PLT 253.0 03/07/2021 1303   MCV 91.8 03/07/2021 1303   MCH 30.9 05/25/2020 0959   MCHC 32.0 03/07/2021 1303   RDW 14.7 03/07/2021 1303   LYMPHSABS 2.5 03/07/2021 1303   MONOABS 0.9 03/07/2021 1303   EOSABS 0.2 03/07/2021 1303   BASOSABS 0.1 03/07/2021 1303    No results  found for: POCLITH, LITHIUM   No results found for: PHENYTOIN, PHENOBARB, VALPROATE, CBMZ   .res Assessment: Plan:    Josua was seen today for follow-up, depression, anxiety and sleeping problem.  Diagnoses and all orders for this visit:  Major depressive disorder, recurrent episode, moderate (HCC) -     venlafaxine XR (EFFEXOR-XR) 150 MG 24 hr capsule; Take 2 capsules (300 mg total) by mouth daily.  Insomnia due to mental condition -     clonazePAM (KLONOPIN) 1 MG tablet; TAKE ONE TABLET AT BEDTIME AS NEEDED FOR ANXIETY  Tremor of unknown origin  Recurrent major depressive disorder, in full remission (HCC) -     ARIPiprazole (ABILIFY) 2 MG tablet; Take 1 tablet (2 mg total) by mouth daily.  Generalized anxiety disorder -     venlafaxine XR (EFFEXOR-XR) 150 MG 24 hr capsule; Take 2 capsules (300 mg total) by mouth  daily.   Greater than 50% of 30 min face to face time with patient was spent on counseling and coordination of care. We discussed that Lauretta Jerrit has a long history of depression and anxiety the has been fairly well controlled in recent years.  He was on the maximum dosage of venlafaxine and when he had a recurrence of depression in 2017, aripiprazole 2 mg was added.   His depression had been in remission on Abilify 5 mg and then 2 mg daily.   Unfortunately no prn for med and disc management of depression behaviorally.  No indication for med change Continue venlafaxine XR 300 mg daily Abillify 2  Option change Effexor to duloxetine or Trintellix.   Disc SE in detail and SSRI withdrawal sx. Option buspirone.  Extensive discussion of sleep hygiene.  No caffeine after 3 pm.    Defer weaning gabapentin bc residual significant anxiety.  We discussed the short-term risks associated with benzodiazepines including sedation and increased fall risk among others.  Discussed long-term side effect risk including dependence, potential withdrawal symptoms, and the potential eventual  dose-related risk of dementia.  Also disc risk affecting balance and memory.  No higher dose than 1-1.5 mg but that dose will be allowed since failed other sleepers and tolerating it.  Cautioned again against exceeding dosage of clonazepam without my OK.  Discussed potential metabolic side effects associated with atypical antipsychotics, as well as potential risk for movement side effects. Advised pt to contact office if movement side effects occur.   Follow-up 75mo  CLynder Parents MD, DFAPA    No future appointments.   No orders of the defined types were placed in this encounter.   -------------------------------

## 2021-04-12 ENCOUNTER — Other Ambulatory Visit: Payer: Self-pay | Admitting: Pharmacist

## 2021-04-12 DIAGNOSIS — Z5181 Encounter for therapeutic drug level monitoring: Secondary | ICD-10-CM

## 2021-04-12 NOTE — Progress Notes (Signed)
Patient is due for CMP and CBC. Takes Ofev '100mg'$  twice daily. Future lab orders placed for both today  Sent MyChart message to patient to advise  CMP Latest Ref Rng & Units 03/07/2021 01/29/2021 12/25/2020  Glucose 70 - 99 mg/dL - - -  BUN 6 - 23 mg/dL - - -  Creatinine 0.40 - 1.50 mg/dL - - -  Sodium 135 - 145 mEq/L - - -  Potassium 3.5 - 5.1 mEq/L - - -  Chloride 96 - 112 mEq/L - - -  CO2 19 - 32 mEq/L - - -  Calcium 8.4 - 10.5 mg/dL - - -  Total Protein 6.0 - 8.3 g/dL 7.2 7.1 7.3  Total Bilirubin 0.2 - 1.2 mg/dL 0.8 0.8 0.7  Alkaline Phos 39 - 117 U/L 68 75 77  AST 0 - 37 U/L '28 26 23  '$ ALT 0 - 53 U/L '24 20 18    '$ CBC    Component Value Date/Time   WBC 7.8 03/07/2021 1303   RBC 4.71 03/07/2021 1303   HGB 13.8 03/07/2021 1303   HCT 43.2 03/07/2021 1303   PLT 253.0 03/07/2021 1303   MCV 91.8 03/07/2021 1303   MCH 30.9 05/25/2020 0959   MCHC 32.0 03/07/2021 1303   RDW 14.7 03/07/2021 1303   LYMPHSABS 2.5 03/07/2021 1303   MONOABS 0.9 03/07/2021 1303   EOSABS 0.2 03/07/2021 1303   BASOSABS 0.1 03/07/2021 1303   Hideo Googe Wilhemina Bonito, PharmD, MPH, BCPS Clinical Pharmacist (Rheumatology and Pulmonology)

## 2021-05-07 DIAGNOSIS — L821 Other seborrheic keratosis: Secondary | ICD-10-CM | POA: Diagnosis not present

## 2021-05-07 DIAGNOSIS — C44319 Basal cell carcinoma of skin of other parts of face: Secondary | ICD-10-CM | POA: Diagnosis not present

## 2021-05-07 DIAGNOSIS — X32XXXD Exposure to sunlight, subsequent encounter: Secondary | ICD-10-CM | POA: Diagnosis not present

## 2021-05-07 DIAGNOSIS — L570 Actinic keratosis: Secondary | ICD-10-CM | POA: Diagnosis not present

## 2021-05-22 ENCOUNTER — Other Ambulatory Visit (HOSPITAL_BASED_OUTPATIENT_CLINIC_OR_DEPARTMENT_OTHER): Payer: Self-pay

## 2021-05-22 ENCOUNTER — Telehealth (HOSPITAL_BASED_OUTPATIENT_CLINIC_OR_DEPARTMENT_OTHER): Payer: Self-pay | Admitting: Pharmacist

## 2021-05-22 MED ORDER — ZOSTER VAC RECOMB ADJUVANTED 50 MCG/0.5ML IM SUSR
INTRAMUSCULAR | 0 refills | Status: DC
  Filled 2021-05-22: qty 0.5, 1d supply, fill #0

## 2021-05-22 NOTE — Telephone Encounter (Signed)
Medication Reconciliation completed at [facility].   Harriet Pho, PharmD Clinical Pharmacist [facility]  05/22/2021 1:42 PM

## 2021-05-27 DIAGNOSIS — C61 Malignant neoplasm of prostate: Secondary | ICD-10-CM | POA: Diagnosis not present

## 2021-06-04 DIAGNOSIS — Z85828 Personal history of other malignant neoplasm of skin: Secondary | ICD-10-CM | POA: Diagnosis not present

## 2021-06-04 DIAGNOSIS — Z08 Encounter for follow-up examination after completed treatment for malignant neoplasm: Secondary | ICD-10-CM | POA: Diagnosis not present

## 2021-06-05 DIAGNOSIS — N5201 Erectile dysfunction due to arterial insufficiency: Secondary | ICD-10-CM | POA: Diagnosis not present

## 2021-06-05 DIAGNOSIS — N3943 Post-void dribbling: Secondary | ICD-10-CM | POA: Diagnosis not present

## 2021-06-05 DIAGNOSIS — C61 Malignant neoplasm of prostate: Secondary | ICD-10-CM | POA: Diagnosis not present

## 2021-06-05 DIAGNOSIS — R351 Nocturia: Secondary | ICD-10-CM | POA: Diagnosis not present

## 2021-06-07 ENCOUNTER — Ambulatory Visit (INDEPENDENT_AMBULATORY_CARE_PROVIDER_SITE_OTHER): Payer: Medicare Other

## 2021-06-07 ENCOUNTER — Other Ambulatory Visit: Payer: Self-pay

## 2021-06-07 VITALS — BP 120/80 | HR 107 | Temp 98.7°F | Wt 181.2 lb

## 2021-06-07 DIAGNOSIS — Z0000 Encounter for general adult medical examination without abnormal findings: Secondary | ICD-10-CM | POA: Diagnosis not present

## 2021-06-07 NOTE — Progress Notes (Signed)
Subjective:   Bryan Tyler is a 75 y.o. male who presents for Medicare Annual/Subsequent preventive examination.  Review of Systems     Cardiac Risk Factors include: advanced age (>72men, >64 women);hypertension;dyslipidemia;male gender     Objective:    Today's Vitals   06/07/21 1109  BP: 120/80  Pulse: (!) 107  Temp: 98.7 F (37.1 C)  SpO2: 93%  Weight: 181 lb 3.2 oz (82.2 kg)   Body mass index is 26.76 kg/m.  Advanced Directives 06/07/2021 05/03/2019 09/24/2018 09/22/2018 04/29/2018 05/21/2017 05/19/2017  Does Patient Have a Medical Advance Directive? Yes Yes Yes Yes No Yes Yes  Type of Paramedic of [place];Living will [place];Living will [place];Living will - Living will;Healthcare Power of [place]  Does patient want to make changes to medical advance directive? - - No - Patient declined No - Patient declined - No - Patient declined No - Patient declined  Copy of [place] in Chart? No - copy requested No - copy requested Yes - validated most recent copy scanned in chart (See row information) Yes - validated most recent copy scanned in chart (See row information) - No - copy requested No - copy requested  Would patient like information on creating a medical advance directive? - - - - - - -  Pre-existing out of facility DNR order (yellow form or pink MOST form) - - - - - - -    Current Medications (verified) Outpatient Encounter Medications as of 06/07/2021  Medication Sig   ARIPiprazole (ABILIFY) 2 MG tablet Take 1 tablet (2 mg total) by mouth daily.   atorvastatin (LIPITOR) 40 MG tablet Take 1 tablet (40 mg total) by mouth every evening.   benzonatate (TESSALON) 200 MG capsule Take 1 capsule (200 mg total) by mouth 2 (two) times daily as needed for cough.   clonazePAM (KLONOPIN) 1 MG tablet TAKE ONE TABLET AT BEDTIME AS NEEDED  FOR ANXIETY   gabapentin (NEURONTIN) 800 MG tablet Take 1 tablet (800 mg total) by mouth 2 (two) times daily.   ibuprofen (ADVIL) 200 MG tablet Take 400 mg by mouth as needed.   levothyroxine (LEVOXYL) 112 MCG tablet Take 1 tablet (112 mcg total) by mouth daily.   Multiple Vitamin (MULTIVITAMIN WITH MINERALS) TABS tablet Take 1 tablet by mouth daily.   omeprazole (PRILOSEC) 40 MG capsule Take 1 capsule (40 mg total) by mouth in the morning and at bedtime.   oxymetazoline (AFRIN) 0.05 % nasal spray Place 2 sprays into both nostrils 3 (three) times daily as needed for congestion.    tamsulosin (FLOMAX) 0.4 MG CAPS capsule Take 1 capsule (0.4 mg total) by mouth daily. (Patient taking differently: Take 0.4 mg by mouth every evening.)   venlafaxine XR (EFFEXOR-XR) 150 MG 24 hr capsule Take 2 capsules (300 mg total) by mouth daily.   albuterol (VENTOLIN HFA) 108 (90 Base) MCG/ACT inhaler Inhale 2 puffs into the lungs every 6 (six) hours as needed for wheezing or shortness of breath. (Patient not taking: Reported on 06/07/2021)   Nintedanib (OFEV) 100 MG CAPS Take 1 capsule (100 mg total) by mouth 2 (two) times daily. (Patient not taking: Reported on 06/07/2021)   Zoster Vaccine Adjuvanted [facility]) injection Inject into the muscle.   [DISCONTINUED] Naftifine HCl 2 % CREA Apply thin layer to affected area once daily for 2-3 weeks. (Patient not taking: No sig reported)   No  facility-administered encounter medications on file as of 06/07/2021.    Allergies (verified) Codeine, Amoxicillin-pot clavulanate, Ciprofloxacin, and Quinolones   History: Past Medical History:  Diagnosis Date   Adenocarcinoma of prostate ([place]) 04/16/2013   Aneurysm, aorta, thoracic    Anxiety    Arthritis    "knees" (05/21/2017)   Depression    Diverticulosis    Dyspnea    on exertion for a long period time   GERD (gastroesophageal reflux disease)    Hyperlipidemia    Hypertension    Hypothyroidism, postradioiodine  therapy    1980's   Nocturia    Organic impotence    OSA (obstructive sleep apnea)    NO CPAP--  S/P SURGERY  2002   Osteoporosis    Pneumonia 1970   PONV (postoperative nausea and vomiting)    Urge urinary incontinence    Wears contact lenses    Past Surgical History:  Procedure Laterality Date   HEMORRHOIDECTOMY WITH HEMORRHOID BANDING  2007   INGUINAL HERNIA REPAIR Bilateral    x4  (2 each side)   INSERTION PROSTATE RADIATION SEED     KNEE ARTHROSCOPY Right 2012   NASAL SEPTUM SURGERY  1982   w/ rhinoplasty   PROSTATE BIOPSY     RADIOACTIVE SEED IMPLANT N/A 05/30/2013   Procedure: RADIOACTIVE SEED IMPLANT;  Surgeon: Hanley Ben, MD;  Location: [place];  Service: Urology;  Laterality: N/A;   REVERSE SHOULDER ARTHROPLASTY Right 05/21/2017   REVERSE SHOULDER ARTHROPLASTY Right 05/21/2017   REVERSE SHOULDER ARTHROPLASTY Right 05/21/2017   Procedure: REVERSE RIGHT SHOULDER ARTHROPLASTY;  Surgeon: Justice Britain, MD;  Location: [place];  Service: Orthopedics;  Laterality: Right;   RHINOPLASTY  1982   w/w/ septoplasty   SHOULDER ARTHROSCOPY W/ ROTATOR CUFF REPAIR Right 2004   SHOULDER ARTHROSCOPY W/ ROTATOR CUFF REPAIR Left 2016   TONSILLECTOMY  AS CHILD   TOTAL KNEE ARTHROPLASTY Right 09/24/2018   Procedure: TOTAL KNEE ARTHROPLASTY;  Surgeon: Sydnee Cabal, MD;  Location: [facility];  Service: Orthopedics;  Laterality: Right;  Adductor Block   UVULOPALATOPHARYNGOPLASTY  2002   Family History  Problem Relation Age of Onset   Cancer Mother        Pancreatic Cancer   Heart disease Father 4       CABG   Social History   Socioeconomic History   Marital status: Married    Spouse name: Not on file   Number of children: Not on file   Years of education: Not on file   Highest education level: Not on file  Occupational History   Occupation: Middle School Teacher  Tobacco Use   Smoking status: Former    Packs/day: 1.00    Years: 10.00    Pack years: 10.00     Types: Cigarettes    Quit date: 08/11/1978    Years since quitting: 42.8   Smokeless tobacco: Never  Vaping Use   Vaping Use: Never used  Substance and Sexual Activity   Alcohol use: Yes    Comment: 05/21/2017 "1-2 beers/month"   Drug use: No   Sexual activity: Not Currently  Other Topics Concern   Not on file  Social History Narrative   Not on file   Social Determinants of Health   Financial Resource Strain: Low Risk    Difficulty of Paying Living Expenses: Not hard at all  Food Insecurity: No Food Insecurity   Worried About [place] in the Last Year: Never true   Ran Out  of Food in the Last Year: Never true  Transportation Needs: No Transportation Needs   Lack of Transportation (Medical): No   Lack of Transportation (Non-Medical): No  Physical Activity: Sufficiently Active   Days of Exercise per Week: 3 days   Minutes of Exercise per Session: 90 min  Stress: Stress Concern Present   Feeling of Stress : To some extent  Social Connections: Moderately Isolated   Frequency of Communication with Friends and Family: More than three times a week   Frequency of Social Gatherings with Friends and Family: More than three times a week   Attends Religious Services: Never   Marine scientist or Organizations: No   Attends Music therapist: Never   Marital Status: Married    Tobacco Counseling Counseling given: Not Answered   Clinical Intake:  Pre-visit preparation completed: Yes  Pain : No/denies pain     BMI - recorded: 26.76 Nutritional Status: BMI 25 -29 Overweight Nutritional Risks: None Diabetes: No  How often do you need to have someone help you when you read instructions, pamphlets, or other written materials from your doctor or pharmacy?: 1 - Never  Diabetic?No  Interpreter Needed?: No  Information entered by :: Charlott Rakes, LPN   Activities of Daily Living In your present state of health, do you have any difficulty  performing the following activities: 06/07/2021 11/28/2020  Hearing? Y Y  Comment slight loss -  Vision? N N  Difficulty concentrating or making decisions? N N  Walking or climbing stairs? Y Y  Comment SOB -  Dressing or bathing? N N  Doing errands, shopping? N N  Preparing Food and eating ? N -  Using the Toilet? N -  In the past six months, have you accidently leaked urine? Y -  Do you have problems with loss of bowel control? N -  Managing your Medications? N -  Managing your Finances? N -  Housekeeping or managing your Housekeeping? N -  Some recent data might be hidden    Patient Care Team: Vivi Barrack, MD as PCP - General (Family Medicine) Jettie Booze, MD as PCP - Cardiology (Cardiology) Sydnee Cabal, MD (Orthopedic Surgery) Comer Locket, PA-C as Physician Assistant (Physician Assistant) Alyson Ingles Candee Furbish, MD as Consulting Physician (Urology) Delight Hoh, MD (Inactive) as Consulting Physician (Psychiatry) Otelia Sergeant, OD as Referring Physician Cottle, Billey Co., MD as Attending Physician (Psychiatry)  Indicate any recent Medical Services you may have received from other than [facility] providers in the past year (date may be approximate).     Assessment:   This is a routine wellness examination for [place].  Hearing/Vision screen Hearing Screening - Comments:: Pt stated slight loss  Vision Screening - Comments:: Pt follows follows up with [facility] for annual eye exams   Dietary issues and exercise activities discussed: Current Exercise Habits: Home exercise routine, Type of exercise: strength training/weights;walking;Other - see comments, Time (Minutes): > 60, Frequency (Times/Week): 3, Weekly Exercise (Minutes/Week): 0   Goals Addressed             This Visit's Progress    Patient Stated       Keep weight down        Depression Screen PHQ 2/9 Scores 06/07/2021 02/13/2021 11/28/2020 05/25/2020 02/29/2020 05/03/2019 05/03/2019  PHQ  - 2 Score 1 0 0 2 0 1 0  PHQ- 9 Score - - 4 7 0 1 -    Fall Risk Fall Risk  06/07/2021 02/13/2021 11/28/2020 05/25/2020 05/03/2019  Falls in the past year? 0 1 0 - 0  Number falls in past yr: 0 1 0 0 0  Injury with Fall? 0 1 0 - 0  Risk for fall due to : Impaired vision;Impaired balance/gait;Impaired mobility No Fall Risks - - History of fall(s)  Follow up Falls prevention discussed - - - -    FALL RISK PREVENTION PERTAINING TO THE HOME:  Any stairs in or around the home? Yes  If so, are there any without handrails? No  Home free of loose throw rugs in walkways, pet beds, electrical cords, etc? Yes  Adequate lighting in your home to reduce risk of falls? Yes   ASSISTIVE DEVICES UTILIZED TO PREVENT FALLS:  Life alert? No  Use of a cane, walker or w/c? No  Grab bars in the bathroom? Yes  Shower chair or bench in shower? No  Elevated toilet seat or a handicapped toilet? No   TIMED UP AND GO:  Was the test performed? Yes .  Length of time to ambulate 10 feet: 10 sec.   Gait steady and fast without use of assistive device  Cognitive Function: MMSE - Mini Mental State Exam 05/03/2019  Orientation to time 5  Orientation to Place 5  Registration 3  Attention/ Calculation 5  Recall 3  Language- name 2 objects 2  Language- repeat 1  Language- follow 3 step command 3  Language- read & follow direction 1  Write a sentence 1  Copy design 1  Total score 30     6CIT Screen 06/07/2021  What Year? 0 points  What month? 0 points  What time? 0 points  Count back from 20 0 points  Months in reverse 0 points  Repeat phrase 0 points  Total Score 0    Immunizations Immunization History  Administered Date(s) Administered   Fluad Quad(high Dose 65+) 04/06/2019, 04/11/2020   Hepatitis B 10/21/2007, 11/22/2007, 05/09/2008   Influenza Split 05/11/2012   Influenza Whole 06/05/2009, 05/10/2011   Influenza, High Dose Seasonal PF 05/03/2014, 04/01/2018   Influenza, Seasonal, Injecte,  Preservative Fre 04/06/2019   Influenza,inj,Quad PF,6+ Mos 04/11/2016, 04/14/2017   Influenza,inj,quad, With Preservative 05/11/2017, 05/11/2018, 05/12/2019   PFIZER(Purple Top)SARS-COV-2 Vaccination 09/02/2019, 09/23/2019, 05/07/2020   Pneumococcal Conjugate-13 02/06/2014   Pneumococcal Polysaccharide-23 10/31/2008, 04/11/2016   Td 08/11/2006   Tdap 06/02/2017   Zoster Recombinat (Shingrix) 05/22/2021   Zoster, Live 11/22/2007    TDAP status: Up to date  Flu vaccine . Pt stated completed   Pneumococcal vaccine status: Up to date  Covid-19 vaccine status: Completed vaccines  Qualifies for Shingles Vaccine? Yes   Zostavax completed Yes   Shingrix Completed?: No.    Education has been provided regarding the importance of this vaccine. Patient has been advised to call insurance company to determine out of pocket expense if they have not yet received this vaccine. Advised may also receive vaccine at local pharmacy or Health Dept. Verbalized acceptance and understanding.  Screening Tests Health Maintenance  Topic Date Due   COVID-19 Vaccine (4 - Booster for Pfizer series) 07/02/2020   INFLUENZA VACCINE  03/11/2021   Zoster Vaccines- Shingrix (2 of 2) 07/17/2021   TETANUS/TDAP  06/03/2027   Pneumonia Vaccine 32+ Years old  Completed   Hepatitis C Screening  Completed   HPV VACCINES  Aged Out    Health Maintenance  Health Maintenance Due  Topic Date Due   COVID-19 Vaccine (4 - Booster for [place] series) 07/02/2020  INFLUENZA VACCINE  03/11/2021    Colorectal cancer screening: Type of screening: Colonoscopy. Completed 09/04/14. Repeat every 10 years   Additional Screening:  Hepatitis C Screening:  Completed 03/26/07  Vision Screening: Recommended annual ophthalmology exams for early detection of glaucoma and other disorders of the eye. Is the patient up to date with their annual eye exam?  Yes  Who is the provider or what is the name of the office in which the patient  attends annual eye exams? [facility]  If pt is not established with a provider, would they like to be referred to a provider to establish care? No .   Dental Screening: Recommended annual dental exams for proper oral hygiene  Community Resource Referral / Chronic Care Management: CRR required this visit?  No   CCM required this visit?  No      Plan:     I have personally reviewed and noted the following in the patient's chart:   Medical and social history Use of alcohol, tobacco or illicit drugs  Current medications and supplements including opioid prescriptions. Patient is not currently taking opioid prescriptions. Functional ability and status Nutritional status Physical activity Advanced directives List of other physicians Hospitalizations, surgeries, and ER visits in previous 12 months Vitals Screenings to include cognitive, depression, and falls Referrals and appointments  In addition, I have reviewed and discussed with patient certain preventive protocols, quality metrics, and best practice recommendations. A written personalized care plan for preventive services as well as general preventive health recommendations were provided to patient.     Willette Brace, LPN   [id number]   Nurse Notes: pt complained of URI stated he has not been seen and today did not have any Fever VS stable

## 2021-06-07 NOTE — Patient Instructions (Signed)
Mr. Bryan Tyler , Thank you for taking time to come for your Medicare Wellness Visit. I appreciate your ongoing commitment to your health goals. Please review the following plan we discussed and let me know if I can assist you in the future.   Screening recommendations/referrals: Colonoscopy: Done 09/04/14 repeat every 10 years  Recommended yearly ophthalmology/optometry visit for glaucoma screening and checkup Recommended yearly dental visit for hygiene and checkup  Vaccinations: Influenza vaccine: Up to date Pneumococcal vaccine: Up to date Tdap vaccine: Done 06/02/17 repeat every 10 years  Shingles vaccine: Shingrix discussed. Please contact your pharmacy for coverage information.    Covid-19: Completed 1/22, 2/12, & 05/07/20  Advanced directives: Please bring a copy of your health care power of attorney and living will to the office at your convenience.  Conditions/risks identified: Keep weight down   Next appointment: Follow up in one year for your annual wellness visit.   Preventive Care 25 Years and Older, Male Preventive care refers to lifestyle choices and visits with your health care provider that can promote health and wellness. What does preventive care include? A yearly physical exam. This is also called an annual well check. Dental exams once or twice a year. Routine eye exams. Ask your health care provider how often you should have your eyes checked. Personal lifestyle choices, including: Daily care of your teeth and gums. Regular physical activity. Eating a healthy diet. Avoiding tobacco and drug use. Limiting alcohol use. Practicing safe sex. Taking low doses of aspirin every day. Taking vitamin and mineral supplements as recommended by your health care provider. What happens during an annual well check? The services and screenings done by your health care provider during your annual well check will depend on your age, overall health, lifestyle risk factors, and family  history of disease. Counseling  Your health care provider may ask you questions about your: Alcohol use. Tobacco use. Drug use. Emotional well-being. Home and relationship well-being. Sexual activity. Eating habits. History of falls. Memory and ability to understand (cognition). Work and work Statistician. Screening  You may have the following tests or measurements: Height, weight, and BMI. Blood pressure. Lipid and cholesterol levels. These may be checked every 5 years, or more frequently if you are over 79 years old. Skin check. Lung cancer screening. You may have this screening every year starting at age 106 if you have a 30-pack-year history of smoking and currently smoke or have quit within the past 15 years. Fecal occult blood test (FOBT) of the stool. You may have this test every year starting at age 53. Flexible sigmoidoscopy or colonoscopy. You may have a sigmoidoscopy every 5 years or a colonoscopy every 10 years starting at age 38. Prostate cancer screening. Recommendations will vary depending on your family history and other risks. Hepatitis C blood test. Hepatitis B blood test. Sexually transmitted disease (STD) testing. Diabetes screening. This is done by checking your blood sugar (glucose) after you have not eaten for a while (fasting). You may have this done every 1-3 years. Abdominal aortic aneurysm (AAA) screening. You may need this if you are a current or former smoker. Osteoporosis. You may be screened starting at age 71 if you are at high risk. Talk with your health care provider about your test results, treatment options, and if necessary, the need for more tests. Vaccines  Your health care provider may recommend certain vaccines, such as: Influenza vaccine. This is recommended every year. Tetanus, diphtheria, and acellular pertussis (Tdap, Td) vaccine. You may  need a Td booster every 10 years. Zoster vaccine. You may need this after age 74. Pneumococcal  13-valent conjugate (PCV13) vaccine. One dose is recommended after age 58. Pneumococcal polysaccharide (PPSV23) vaccine. One dose is recommended after age 31. Talk to your health care provider about which screenings and vaccines you need and how often you need them. This information is not intended to replace advice given to you by your health care provider. Make sure you discuss any questions you have with your health care provider. Document Released: 08/24/2015 Document Revised: 04/16/2016 Document Reviewed: 05/29/2015 Elsevier Interactive Patient Education  2017 [place] Prevention in the Home Falls can cause injuries. They can happen to people of all ages. There are many things you can do to make your home safe and to help prevent falls. What can I do on the outside of my home? Regularly fix the edges of walkways and driveways and fix any cracks. Remove anything that might make you trip as you walk through a door, such as a raised step or threshold. Trim any bushes or trees on the path to your home. Use bright outdoor lighting. Clear any walking paths of anything that might make someone trip, such as rocks or tools. Regularly check to see if handrails are loose or broken. Make sure that both sides of any steps have handrails. Any raised decks and porches should have guardrails on the edges. Have any leaves, snow, or ice cleared regularly. Use sand or salt on walking paths during winter. Clean up any spills in your garage right away. This includes oil or grease spills. What can I do in the bathroom? Use night lights. Install grab bars by the toilet and in the tub and shower. Do not use towel bars as grab bars. Use non-skid mats or decals in the tub or shower. If you need to sit down in the shower, use a plastic, non-slip stool. Keep the floor dry. Clean up any water that spills on the floor as soon as it happens. Remove soap buildup in the tub or shower regularly. Attach  bath mats securely with double-sided non-slip rug tape. Do not have throw rugs and other things on the floor that can make you trip. What can I do in the bedroom? Use night lights. Make sure that you have a light by your bed that is easy to reach. Do not use any sheets or blankets that are too big for your bed. They should not hang down onto the floor. Have a firm chair that has side arms. You can use this for support while you get dressed. Do not have throw rugs and other things on the floor that can make you trip. What can I do in the kitchen? Clean up any spills right away. Avoid walking on wet floors. Keep items that you use a lot in easy-to-reach places. If you need to reach something above you, use a strong step stool that has a grab bar. Keep electrical cords out of the way. Do not use floor polish or wax that makes floors slippery. If you must use wax, use non-skid floor wax. Do not have throw rugs and other things on the floor that can make you trip. What can I do with my stairs? Do not leave any items on the stairs. Make sure that there are handrails on both sides of the stairs and use them. Fix handrails that are broken or loose. Make sure that handrails are as long as the stairways.  Check any carpeting to make sure that it is firmly attached to the stairs. Fix any carpet that is loose or worn. Avoid having throw rugs at the top or bottom of the stairs. If you do have throw rugs, attach them to the floor with carpet tape. Make sure that you have a light switch at the top of the stairs and the bottom of the stairs. If you do not have them, ask someone to add them for you. What else can I do to help prevent falls? Wear shoes that: Do not have high heels. Have rubber bottoms. Are comfortable and fit you well. Are closed at the toe. Do not wear sandals. If you use a stepladder: Make sure that it is fully opened. Do not climb a closed stepladder. Make sure that both sides of the  stepladder are locked into place. Ask someone to hold it for you, if possible. Clearly mark and make sure that you can see: Any grab bars or handrails. First and last steps. Where the edge of each step is. Use tools that help you move around (mobility aids) if they are needed. These include: Canes. Walkers. Scooters. Crutches. Turn on the lights when you go into a dark area. Replace any light bulbs as soon as they burn out. Set up your furniture so you have a clear path. Avoid moving your furniture around. If any of your floors are uneven, fix them. If there are any pets around you, be aware of where they are. Review your medicines with your doctor. Some medicines can make you feel dizzy. This can increase your chance of falling. Ask your doctor what other things that you can do to help prevent falls. This information is not intended to replace advice given to you by your health care provider. Make sure you discuss any questions you have with your health care provider. Document Released: 05/24/2009 Document Revised: 01/03/2016 Document Reviewed: 09/01/2014 Elsevier Interactive Patient Education  2017 [employer].

## 2021-06-10 ENCOUNTER — Ambulatory Visit (INDEPENDENT_AMBULATORY_CARE_PROVIDER_SITE_OTHER): Payer: Medicare Other | Admitting: Family Medicine

## 2021-06-10 ENCOUNTER — Other Ambulatory Visit: Payer: Self-pay

## 2021-06-10 ENCOUNTER — Encounter: Payer: Self-pay | Admitting: Family Medicine

## 2021-06-10 VITALS — BP 126/80 | HR 101 | Temp 97.5°F | Ht 69.0 in | Wt 180.8 lb

## 2021-06-10 DIAGNOSIS — F419 Anxiety disorder, unspecified: Secondary | ICD-10-CM

## 2021-06-10 DIAGNOSIS — I10 Essential (primary) hypertension: Secondary | ICD-10-CM | POA: Diagnosis not present

## 2021-06-10 DIAGNOSIS — F32A Depression, unspecified: Secondary | ICD-10-CM

## 2021-06-10 DIAGNOSIS — J849 Interstitial pulmonary disease, unspecified: Secondary | ICD-10-CM

## 2021-06-10 MED ORDER — DOXYCYCLINE HYCLATE 100 MG PO TABS: 100.0000 mg | ORAL_TABLET | Freq: Two times a day (BID) | ORAL | 0 refills | Status: DC

## 2021-06-10 MED ORDER — PREDNISONE 50 MG PO TABS: ORAL_TABLET | ORAL | 0 refills | Status: DC

## 2021-06-10 NOTE — Patient Instructions (Signed)
It was very nice to see you today!  Please start prednisone and doxycycline.  Talk to your psychiatrist abut hydroxyzine.  Take care, Dr [name]  PLEASE NOTE:  If you had any lab tests please let us know if you have not heard back within a few days. You may see your results on mychart before we have a chance to review them but we will give you a call once they are reviewed by [place]. If we ordered any referrals today, please let us know if you have not heard from their office within the next week.   Please try these tips to maintain a healthy lifestyle:  Eat at least 3 REAL meals and 1-2 snacks per day.  Aim for no more than 5 hours between eating.  If you eat breakfast, please do so within one hour of getting up.   Each meal should contain half fruits/vegetables, one quarter protein, and one quarter carbs (no bigger than a computer mouse)  Cut down on sweet beverages. This includes juice, soda, and sweet tea.   Drink at least 1 glass of water with each meal and aim for at least 8 glasses per day  Exercise at least 150 minutes every week.

## 2021-06-10 NOTE — Assessment & Plan Note (Signed)
Follows with psychiatry.  On Abilify, clonazepam, gabapentin, and venlafaxine.  He will discuss starting hydroxyzine as needed at his next follow-up visit.

## 2021-06-10 NOTE — Assessment & Plan Note (Signed)
At goal off medications. 

## 2021-06-10 NOTE — Progress Notes (Signed)
   Bryan Tyler is a 75 y.o. male who presents today for an office visit.  Assessment/Plan:  Chronic Problems Addressed Today: HTN (hypertension) At goal off medications.   Interstitial lung disease (HCC) Acute flare.  No red flags. We will start course of doxycycline and prednisone.  He will follow-up with pulmonology as previously planned. Discussed reasons to return to care and seek emergent care.  Anxiety and depression Follows with psychiatry.  On Abilify, clonazepam, gabapentin, and venlafaxine.  He will discuss starting hydroxyzine as needed at his next follow-up visit.    Subjective:  HPI:  CC of the Pt is weakness, cough, and coughing up phlegm. This has been going on for a couple weeks, during which the condition has not varied. He has been taking Mucinex, but is unsure if it has produced any alleviation.   He denies fever, but admits fatigue/lethargy. He states that breathing can be difficult when coughing, and going up flights of stairs. He denies having a continuously runny nose. A sore throat was present in the first 3-4 days and resolved, which has not recurred since.  He mentions that he suffers from anxiety, and was recommended to ask about taking Hydroxyzine. He states that it is not severe but would like to manage it better.          Objective:  Physical Exam: BP 126/80 (BP Location: Left Arm, Patient Position: Sitting, Cuff Size: Normal)   Pulse (!) 101   Temp (!) 97.5 F (36.4 C) (Temporal)   Ht 5\' 9"  (1.753 m)   Wt 180 lb 12.8 oz (82 kg)   SpO2 96%   BMI 26.70 kg/m   Gen: No acute distress, resting comfortably CV: Regular rate and rhythm with no murmurs appreciated Pulm: NWOB. Ronchi and crackles at bilateral bases. Neuro: Grossly normal, moves all extremities Psych: Normal affect and thought content      I,Jordan Kelly,acting as a scribe for Dimas Chyle, MD.,have documented all relevant documentation on the behalf of Dimas Chyle, MD,as  directed by  Dimas Chyle, MD while in the presence of Dimas Chyle, MD.  I, Dimas Chyle, MD, have reviewed all documentation for this visit. The documentation on 06/10/21 for the exam, diagnosis, procedures, and orders are all accurate and complete.  Algis Greenhouse. [name], MD 06/10/2021 1:33 PM

## 2021-06-10 NOTE — Assessment & Plan Note (Addendum)
Acute flare.  No red flags. We will start course of doxycycline and prednisone.  He will follow-up with pulmonology as previously planned. Discussed reasons to return to care and seek emergent care.

## 2021-06-11 ENCOUNTER — Ambulatory Visit (INDEPENDENT_AMBULATORY_CARE_PROVIDER_SITE_OTHER): Payer: Medicare Other | Admitting: Pulmonary Disease

## 2021-06-11 ENCOUNTER — Encounter: Payer: Self-pay | Admitting: Pulmonary Disease

## 2021-06-11 VITALS — BP 126/70 | HR 102 | Temp 98.0°F | Ht 68.0 in | Wt 181.2 lb

## 2021-06-11 DIAGNOSIS — J849 Interstitial pulmonary disease, unspecified: Secondary | ICD-10-CM

## 2021-06-11 DIAGNOSIS — Z5181 Encounter for therapeutic drug level monitoring: Secondary | ICD-10-CM | POA: Diagnosis not present

## 2021-06-11 DIAGNOSIS — J84112 Idiopathic pulmonary fibrosis: Secondary | ICD-10-CM

## 2021-06-11 LAB — CBC WITH DIFFERENTIAL/PLATELET
Basophils Absolute: 0 10*3/uL (ref 0.0–0.1)
Basophils Relative: 0.2 % (ref 0.0–3.0)
Eosinophils Absolute: 0 10*3/uL (ref 0.0–0.7)
Eosinophils Relative: 0.2 % (ref 0.0–5.0)
HCT: 38.5 % — ABNORMAL LOW (ref 39.0–52.0)
Hemoglobin: 12.6 g/dL — ABNORMAL LOW (ref 13.0–17.0)
Lymphocytes Relative: 18.4 % (ref 12.0–46.0)
Lymphs Abs: 2.8 10*3/uL (ref 0.7–4.0)
MCHC: 32.8 g/dL (ref 30.0–36.0)
MCV: 91.9 fl (ref 78.0–100.0)
Monocytes Absolute: 1.6 10*3/uL — ABNORMAL HIGH (ref 0.1–1.0)
Monocytes Relative: 10.4 % (ref 3.0–12.0)
Neutro Abs: 10.9 10*3/uL — ABNORMAL HIGH (ref 1.4–7.7)
Neutrophils Relative %: 70.8 % (ref 43.0–77.0)
Platelets: 335 10*3/uL (ref 150.0–400.0)
RBC: 4.19 Mil/uL — ABNORMAL LOW (ref 4.22–5.81)
RDW: 13.7 % (ref 11.5–15.5)
WBC: 15.4 10*3/uL — ABNORMAL HIGH (ref 4.0–10.5)

## 2021-06-11 LAB — PULMONARY FUNCTION TEST
DL/VA % pred: 84 %
DL/VA: 3.36 ml/min/mmHg/L
DLCO unc % pred: 46 %
DLCO unc: 10.88 ml/min/mmHg
FEF 25-75 Post: 3.78 L/sec
FEF 25-75 Pre: 3.42 L/sec
FEF2575-%Change-Post: 10 %
FEF2575-%Pred-Post: 189 %
FEF2575-%Pred-Pre: 171 %
FEV1-%Change-Post: 1 %
FEV1-%Pred-Post: 73 %
FEV1-%Pred-Pre: 72 %
FEV1-Post: 2.05 L
FEV1-Pre: 2.02 L
FEV1FVC-%Change-Post: 2 %
FEV1FVC-%Pred-Pre: 122 %
FEV6-%Change-Post: -2 %
FEV6-%Pred-Post: 61 %
FEV6-%Pred-Pre: 63 %
FEV6-Post: 2.22 L
FEV6-Pre: 2.28 L
FEV6FVC-%Change-Post: 0 %
FEV6FVC-%Pred-Post: 106 %
FEV6FVC-%Pred-Pre: 107 %
FVC-%Change-Post: 0 %
FVC-%Pred-Post: 58 %
FVC-%Pred-Pre: 59 %
FVC-Post: 2.26 L
FVC-Pre: 2.28 L
Post FEV1/FVC ratio: 91 %
Post FEV6/FVC ratio: 100 %
Pre FEV1/FVC ratio: 89 %
Pre FEV6/FVC Ratio: 100 %
RV % pred: 58 %
RV: 1.43 L
TLC % pred: 54 %
TLC: 3.64 L

## 2021-06-11 LAB — COMPREHENSIVE METABOLIC PANEL
ALT: 18 U/L (ref 0–53)
AST: 20 U/L (ref 0–37)
Albumin: 4.3 g/dL (ref 3.5–5.2)
Alkaline Phosphatase: 76 U/L (ref 39–117)
BUN: 15 mg/dL (ref 6–23)
CO2: 28 mEq/L (ref 19–32)
Calcium: 9.4 mg/dL (ref 8.4–10.5)
Chloride: 99 mEq/L (ref 96–112)
Creatinine, Ser: 0.87 mg/dL (ref 0.40–1.50)
GFR: 83.7 mL/min (ref >60.00)
Glucose, Bld: 122 mg/dL — ABNORMAL HIGH (ref 70–99)
Potassium: 4 mEq/L (ref 3.5–5.1)
Sodium: 136 mEq/L (ref 135–145)
Total Bilirubin: 0.5 mg/dL (ref 0.2–1.2)
Total Protein: 7.5 g/dL (ref 6.0–8.3)

## 2021-06-11 NOTE — Patient Instructions (Signed)
After discussion today we will stop the Ofev as it is causing significant side effects of diarrhea I reviewed the labs that we had today I will make a referral for clinical trials and some we will be getting in touch with you regarding this Continue to maintain an active lifestyle with exercise  Follow-up in 3 months

## 2021-06-11 NOTE — Progress Notes (Signed)
Spirometry pre and post and lung volumes done today.

## 2021-06-11 NOTE — Progress Notes (Addendum)
Bryan Tyler    [id number]    1946-04-07  Primary Care Physician:Parker, Algis Greenhouse, MD  Referring Physician: Vivi Barrack, [address] [place],  [place] [address]  Chief complaint:  Follow-up for IPF Started Esbriet November 2021, stopped in January 2022 due to side effects Started Ofev in February 2022, dose reduced 100 mg due to diarrhea  HPI: 75 year old with history of pulmonary fibrosis, dyspnea, GERD, hypertension, hyperlipidemia Previously followed by Dr. Chase Caller.  Diagnosed with pulmonary fibrosis in 2019.  He was initially prescribed Ofev but denied by insurance Esbriet was then started.  He took this for 1 month and then stopped  the medication  He was reevaluated 2021 for worsening dyspnea with progressive pulmonary fibrosis on CT scan Started on antifibrotic's. Did not tolerate Esbriet due to side effects Started Ofev in February 2022  Referred for lung transplant at [facility] in 2022 but denied due to presence of thoracic artery aneurysm  Pets: Has a dog.  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  He has feather pillows which he has used for many years.  He got rid of it in November 2021 Smoking history: 20-pack-year smoker.  Quit in 1980 Travel history: Previously lived in [place].  No significant recent travel history Relevant family history: No family history of lung disease  Interim history: Continues on Ofev at 100 mg He states that the diarrhea is intolerable in spite of reduced dose.  He is taking Imodium which is not helping Wants to come off this medication  Also has a viral URI.  COVID home test was negative.  He is getting outpatient antibiotics and prednisone from his primary care with improvement in symptoms Continues exercise program at [facility] with improvement in exercise capacity.  He has lost some weight as well  Outpatient Encounter Medications as of  06/11/2021  Medication Sig   albuterol (VENTOLIN HFA) 108 (90 Base) MCG/ACT inhaler Inhale 2 puffs into the lungs every 6 (six) hours as needed for wheezing or shortness of breath.   ARIPiprazole (ABILIFY) 2 MG tablet Take 1 tablet (2 mg total) by mouth daily.   atorvastatin (LIPITOR) 40 MG tablet Take 1 tablet (40 mg total) by mouth every evening.   benzonatate (TESSALON) 200 MG capsule Take 1 capsule (200 mg total) by mouth 2 (two) times daily as needed for cough.   clonazePAM (KLONOPIN) 1 MG tablet TAKE ONE TABLET AT BEDTIME AS NEEDED FOR ANXIETY   doxycycline (VIBRA-TABS) 100 MG tablet Take 1 tablet (100 mg total) by mouth 2 (two) times daily.   gabapentin (NEURONTIN) 800 MG tablet Take 1 tablet (800 mg total) by mouth 2 (two) times daily.   ibuprofen (ADVIL) 200 MG tablet Take 400 mg by mouth as needed.   levothyroxine (LEVOXYL) 112 MCG tablet Take 1 tablet (112 mcg total) by mouth daily.   Multiple Vitamin (MULTIVITAMIN WITH MINERALS) TABS tablet Take 1 tablet by mouth daily.   omeprazole (PRILOSEC) 40 MG capsule Take 1 capsule (40 mg total) by mouth in the morning and at bedtime.   oxymetazoline (AFRIN) 0.05 % nasal spray Place 2 sprays into both nostrils 3 (three) times daily as needed for congestion.    predniSONE (DELTASONE) 50 MG tablet Take 1 tablet daily for 5 days. Then take 1/2 tablet daily for 2 days.   tamsulosin (FLOMAX) 0.4 MG CAPS capsule Take 1 capsule (0.4 mg total)  by mouth daily. (Patient taking differently: Take 0.4 mg by mouth every evening.)   venlafaxine XR (EFFEXOR-XR) 150 MG 24 hr capsule Take 2 capsules (300 mg total) by mouth daily.   Zoster Vaccine Adjuvanted [facility]) injection Inject into the muscle.   Nintedanib (OFEV) 100 MG CAPS Take 1 capsule (100 mg total) by mouth 2 (two) times daily. (Patient not taking: No sig reported)   No facility-administered encounter medications on file as of 06/11/2021.    Physical Exam: Blood pressure 126/70, pulse (!) 102,  temperature 98 F (36.7 C), temperature source Oral, height 5\' 8"  (1.727 m), weight 181 lb 3.2 oz (82.2 kg), SpO2 95 %. Gen:      No acute distress HEENT:  EOMI, sclera anicteric Neck:     No masses; no thyromegaly Lungs:    Bibasal crackles CV:         Regular rate and rhythm; no murmurs Abd:      + bowel sounds; soft, non-tender; no palpable masses, no distension Ext:    No edema; adequate peripheral perfusion Skin:      Warm and dry; no rash Neuro: alert and oriented x 3 Psych: normal mood and affect   Data Reviewed: Imaging: CT high-resolution 05/02/2020-UIP pattern pulmonary fibrosis progressive since 2013 I have reviewed the images personally.  PFTs: 05/27/2018 FVC 3.08 [77%], FEV1 2.81 [97%], F/F 91  normal spirometry, restriction possible  07/11/2020 FVC 2.42 [62%], FEV1 2.19 [77%], F/F 91, TLC 3.43 [51%] Severe restriction.  Unable to complete diffusion capacity.  04/11/2021 FVC 2.26 [58%], FEV1 2.05 [73%], F/F 91, TLC 3.64 [74%], DLCO 10.88 [46%] Severe restriction, diffusion defect  Labs: Hepatic panel 03/07/2021-within normal limits CBC 03/07/2021-normal  CMP 08/14/2020-within normal limits N-terminal proBNP 07/12/2020-27  Cardiac: Echocardiogram 10/02/2020- LVEF 55-60%, normal PA systolic pressure, estimated RVSP 30.2.  RV is mildly dilated.  Assessment:  Progressive pulmonary fibrosis Likely has IPF given UIP pattern Elevated CCP is likely nonsignificant.  He does have prior exposure to feather pillows but CT scan is not consistent with hypersensitivity pneumonitis.    Has not tolerated Esbriet even at a lower dose due to brain fog, fatigue Now not tolerating Ofev at 100 mg due to diarrhea Echo reviewed with no significant pulmonary hypertension.  We will continue to monitor  We had a long discussion today and he would like to come off Ofev as well.  Discussed alternate therapy, supportive care and he would be interested in hearing about clinical trials and I  will make a referral Check labs for monitoring today Continue exercise program at [facility].  We discussed pulmonary rehab but he would prefer to exercise at the gym  Chronic cough Likely secondary to pulmonary fibrosis and GERD He is on Prilosec twice daily, and Tessalon Continue Neurontin to 800 mg twice daily.  He would like to avoid codeine cough syrup  Goals of care Discussed advanced well and he confirms DNR.  Not ready for palliative care yet  Plan/Recommendations: Discontinue Ofev Check monitoring labs Exercise therapy Referral for clinical trials  Marshell Garfinkel MD [facility] 06/11/2021, 11:09 AM  CC: Vivi Barrack, MD

## 2021-06-12 ENCOUNTER — Encounter: Payer: Self-pay | Admitting: Pulmonary Disease

## 2021-06-13 ENCOUNTER — Telehealth: Payer: Self-pay | Admitting: Pulmonary Disease

## 2021-06-14 NOTE — Telephone Encounter (Signed)
I will route this to JC to follow up on the clinical trials.

## 2021-06-20 ENCOUNTER — Telehealth: Payer: Self-pay

## 2021-06-20 NOTE — Telephone Encounter (Signed)
Patient is calling in stating he came in for congestion and coughing phlegm. Was prescribed medication but Chet says that he finished the medication and still coughing up the phlegm, wanting to know the next steps.

## 2021-06-24 ENCOUNTER — Encounter: Payer: Self-pay | Admitting: Family Medicine

## 2021-06-24 ENCOUNTER — Ambulatory Visit (INDEPENDENT_AMBULATORY_CARE_PROVIDER_SITE_OTHER): Payer: Medicare Other | Admitting: Family Medicine

## 2021-06-24 ENCOUNTER — Other Ambulatory Visit: Payer: Self-pay

## 2021-06-24 VITALS — BP 134/78 | HR 104 | Temp 98.3°F | Ht 68.0 in | Wt 184.6 lb

## 2021-06-24 DIAGNOSIS — B9689 Other specified bacterial agents as the cause of diseases classified elsewhere: Secondary | ICD-10-CM

## 2021-06-24 DIAGNOSIS — J301 Allergic rhinitis due to pollen: Secondary | ICD-10-CM

## 2021-06-24 DIAGNOSIS — J849 Interstitial pulmonary disease, unspecified: Secondary | ICD-10-CM | POA: Diagnosis not present

## 2021-06-24 DIAGNOSIS — R059 Cough, unspecified: Secondary | ICD-10-CM

## 2021-06-24 DIAGNOSIS — J329 Chronic sinusitis, unspecified: Secondary | ICD-10-CM | POA: Diagnosis not present

## 2021-06-24 MED ORDER — AZITHROMYCIN 250 MG PO TABS: ORAL_TABLET | ORAL | 0 refills | Status: DC

## 2021-06-24 MED ORDER — CEFPODOXIME PROXETIL 200 MG PO TABS: 200.0000 mg | ORAL_TABLET | Freq: Two times a day (BID) | ORAL | 0 refills | Status: DC

## 2021-06-24 NOTE — Patient Instructions (Addendum)
Health Maintenance Due  Topic Date Due   COVID-19 Vaccine (4 - Booster for [employer] series) -  Please consider new bivalent booster shot at your local pharmacy when you feel better.   -When you receive, please send [place] the date so we can put it in our system.  07/02/2020   Please trial Azithromycin 250 mg for 5 days and Cefpodoxime 200 mg twice a day for 7 days for your sinus congestion (this is also to cover for pneumonia). Please update me on any new, worsening or persistent symptoms.  I really hope you feel better soon!   Recommended follow up: As needed. If you are not doing better in a week, please schedule a follow-up with either me or Dr.Parker or even pulmonology.

## 2021-06-24 NOTE — Progress Notes (Signed)
Phone [phone] In person visit   Subjective:   Bryan Tyler is a 75 y.o. year old very pleasant male patient who presents for/with See problem oriented charting Chief Complaint  Patient presents with   Cough    Pt c/o chest congestion and cough x6weeks. Pt saw Dr. [name] and rx's did not help.    This visit occurred during the SARS-CoV-2 public health emergency.  Safety protocols were in place, including screening questions prior to the visit, additional usage of staff PPE, and extensive cleaning of exam room while observing appropriate contact time as indicated for disinfecting solutions.   Past Medical History-  Patient Active Problem List   Diagnosis Date Noted   Bunionette of right foot 11/29/2020   Osteoarthritis 10/08/2020   Hyperglycemia 05/25/2020   Interstitial lung disease ([place]) 04/20/2020   Thoracic aortic aneurysm without rupture 04/02/2020   History of total knee replacement, right 12/17/2018   S/p reverse total shoulder arthroplasty 05/21/2017   Foot pain 12/25/2016   HTN (hypertension) 10/16/2015   Adenocarcinoma of prostate ([place]) 04/16/2013   Renal cysts, acquired, bilateral 07/15/2011   HEARING LOSS 09/23/2010   SLEEP APNEA, OBSTRUCTIVE 03/05/2010   ORGANIC IMPOTENCE 02/22/2009   PARESTHESIA 02/22/2009   Internal hemorrhoids 10/31/2008   HYPOTHYROIDISM, POST-RADIOACTIVE IODINE 10/21/2007   Dyslipidemia 10/21/2007   Anxiety and depression 10/21/2007   GERD 10/21/2007   Osteoporosis 10/21/2007    Medications- reviewed and updated Current Outpatient Medications  Medication Sig Dispense Refill   albuterol (VENTOLIN HFA) 108 (90 Base) MCG/ACT inhaler Inhale 2 puffs into the lungs every 6 (six) hours as needed for wheezing or shortness of breath. 18 g 2   ARIPiprazole (ABILIFY) 2 MG tablet Take 1 tablet (2 mg total) by mouth daily. 90 tablet 1   atorvastatin (LIPITOR) 40 MG tablet Take 1 tablet (40 mg total) by mouth every evening. 90 tablet 3    azithromycin (ZITHROMAX) 250 MG tablet Take 2 tabs on day 1, then 1 tab daily until finished 6 tablet 0   benzonatate (TESSALON) 200 MG capsule Take 1 capsule (200 mg total) by mouth 2 (two) times daily as needed for cough. 60 capsule 5   cefpodoxime (VANTIN) 200 MG tablet Take 1 tablet (200 mg total) by mouth 2 (two) times daily. 14 tablet 0   clonazePAM (KLONOPIN) 1 MG tablet TAKE ONE TABLET AT BEDTIME AS NEEDED FOR ANXIETY 90 tablet 1   gabapentin (NEURONTIN) 800 MG tablet Take 1 tablet (800 mg total) by mouth 2 (two) times daily. 180 tablet 1   ibuprofen (ADVIL) 200 MG tablet Take 400 mg by mouth as needed.     levothyroxine (LEVOXYL) 112 MCG tablet Take 1 tablet (112 mcg total) by mouth daily. 90 tablet 3   Multiple Vitamin (MULTIVITAMIN WITH MINERALS) TABS tablet Take 1 tablet by mouth daily.     omeprazole (PRILOSEC) 40 MG capsule Take 1 capsule (40 mg total) by mouth in the morning and at bedtime. 90 capsule 3   oxymetazoline (AFRIN) 0.05 % nasal spray Place 2 sprays into both nostrils 3 (three) times daily as needed for congestion.      tamsulosin (FLOMAX) 0.4 MG CAPS capsule Take 1 capsule (0.4 mg total) by mouth daily. (Patient taking differently: Take 0.4 mg by mouth every evening.) 30 capsule 3   venlafaxine XR (EFFEXOR-XR) 150 MG 24 hr capsule Take 2 capsules (300 mg total) by mouth daily. 180 capsule 1   Zoster Vaccine Adjuvanted [facility]) injection Inject into the  muscle. 0.5 mL 0   No current facility-administered medications for this visit.     Objective:  BP 134/78   Pulse (!) 104   Temp 98.3 F (36.8 C)   Ht 5\' 8"  (1.727 m)   Wt 184 lb 9.6 oz (83.7 kg)   SpO2 95%   BMI 28.07 kg/m  Gen: NAD, resting comfortably CV: Slightly tachycardic but consistent with reading from last visit and pulmonary lungs: Diffuse wheeze, crackles, rhonchi-worse at lung bases.  Respiratory rate slightly elevated in low 20s Ext: no edema or calf pain (doubt DVT/PE) Skin: warm, dry      Assessment and Plan   # Ongoing cough and nasal  and chest congestion and patient with interstitial lung disease S:patient had been seen by Dr. [name] on 06/10/21 with productive cough for about 6 weeks (home covid test 3 weeks ago negative- concern for flare of ILD and started on doxycycline and prednisone. Plan was for pulmonary follow up as well. Saw pulmonary on 06/11/21 Dr. Vaughan Browner- was taken off of Ofev and had not tolerated esbriet- no changes in recent meds by Dr. [name]. Looking at being involved in a study soon. Patient reports  has not improved significantly- perhaps 25%.   Has tried mucinex D without improvement as well. He is still coughing up phlegm.  Some shortness of breath with activity- ok with rest- has been worse in last 6 weeks. Able to go to the gym and does reasonably well there- does not worsen SOB- in fact cough somewhat better- worse after the gym.  ith lack of improvement on prednisone last visit-we will hold off on repeat - has not tried albuterol much- tried once without improvement - tessalon does not help - compliant with omeprazole once a day  Also has sinus congestion and pressure. Clear discharge from nose and clear sputum. Also some sneezing. No watery/itchy eyes.   Augmentin allergy and cannot take quinolones due to aneurysm history A/P: 75 year old male with interstitial lung disease with ongoing nasal congestion and pressure as well as chest congestion and cough.  About 25% improvement on doxycycline and prednisone combination.  With persistent symptoms consider chest x-ray but with interstitial lung disease not sure how beneficial that would be-actually has rhonchi, wheeze, crackles on exam.  Going to presume potential community-acquired pneumonia but also to cover for bacterial sinusitis-we are going to a combination of cefpodoxime (Augmentin listed as allergy but he does not recall what happened with this but denies hives/angioedema/shortness of breath) plus  azithromycin -with sneezing and possible allergy element reasonable to add home flonase -Asked him to follow-up if not improving within a week-me, Dr. [name], Dr. Vaughan Browner all reasonable -Certainly possible ILD flare but w  He also reports some depressed mood with illness-Feels pretty discouraged from having ongoing symptoms. Feels down with first cough of the morning "here we go again" is a common saying in his mind during this time.  He inquired about medication but when I revealed it may take 4 to 6 weeks for medication to be beneficial he would like to hold off and see if he improves first  Recommended follow up: Return for as needed for new, worsening, persistent symptoms. Future Appointments  Date Time Provider [place]  07/01/2021 11:00 AM Vivi Barrack, MD [facility]  07/03/2021  3:30 PM Cottle, Billey Co., MD [facility] None  06/20/2022 11:00 AM [facility] HEALTH COACH [facility]    Lab/Order associations:   ICD-10-CM   1. Bacterial sinusitis  J32.9    B96.89     2. Interstitial lung disease ([place])  J84.9     3. Seasonal allergic rhinitis due to pollen  J30.1     4. Cough, unspecified type  R05.9       Meds ordered this encounter  Medications   cefpodoxime (VANTIN) 200 MG tablet    Sig: Take 1 tablet (200 mg total) by mouth 2 (two) times daily.    Dispense:  14 tablet    Refill:  0    Patient has augmentin allergy listed but denies hives/shortness of breath/angioedema- I think its reasonable to try   azithromycin (ZITHROMAX) 250 MG tablet    Sig: Take 2 tabs on day 1, then 1 tab daily until finished    Dispense:  6 tablet    Refill:  0   I,Jada Bradford,acting as a scribe for Garret Reddish, MD.,have documented all relevant documentation on the behalf of Garret Reddish, MD,as directed by  Garret Reddish, MD while in the presence of Garret Reddish, MD.   I, Garret Reddish, MD, have reviewed all documentation for this visit. The documentation on 06/24/21 for the  exam, diagnosis, procedures, and orders are all accurate and complete.   Return precautions advised.  Garret Reddish, MD

## 2021-06-24 NOTE — Telephone Encounter (Signed)
See note

## 2021-06-24 NOTE — Telephone Encounter (Signed)
He has seen dr Retail banker for this today.  Bryan Tyler. [name], MD 06/24/2021 2:37 PM

## 2021-07-01 ENCOUNTER — Encounter: Payer: Self-pay | Admitting: Family Medicine

## 2021-07-01 ENCOUNTER — Ambulatory Visit (INDEPENDENT_AMBULATORY_CARE_PROVIDER_SITE_OTHER): Payer: Medicare Other | Admitting: Family Medicine

## 2021-07-01 ENCOUNTER — Other Ambulatory Visit: Payer: Self-pay

## 2021-07-01 VITALS — BP 136/89 | HR 89 | Temp 97.9°F | Ht 68.0 in | Wt 184.4 lb

## 2021-07-01 DIAGNOSIS — J849 Interstitial pulmonary disease, unspecified: Secondary | ICD-10-CM | POA: Diagnosis not present

## 2021-07-01 MED ORDER — AMOXICILLIN 500 MG PO TABS: 1000.0000 mg | ORAL_TABLET | Freq: Two times a day (BID) | ORAL | 0 refills | Status: AC

## 2021-07-01 MED ORDER — PROMETHAZINE-DM 6.25-15 MG/5ML PO SYRP: 5.0000 mL | ORAL_SOLUTION | Freq: Four times a day (QID) | ORAL | 0 refills | Status: DC | PRN

## 2021-07-01 NOTE — Patient Instructions (Signed)
It was very nice to see you today!  We will start high-dose amoxicillin and cough syrup.  We need to get a CT scan of your lungs.  We will contact you with results once we have them back.  Please let me know if your symptoms or not improving.  Take care, Dr [name]  PLEASE NOTE:  If you had any lab tests please let us know if you have not heard back within a few days. You may see your results on mychart before we have a chance to review them but we will give you a call once they are reviewed by [place]. If we ordered any referrals today, please let us know if you have not heard from their office within the next week.   Please try these tips to maintain a healthy lifestyle:  Eat at least 3 REAL meals and 1-2 snacks per day.  Aim for no more than 5 hours between eating.  If you eat breakfast, please do so within one hour of getting up.   Each meal should contain half fruits/vegetables, one quarter protein, and one quarter carbs (no bigger than a computer mouse)  Cut down on sweet beverages. This includes juice, soda, and sweet tea.   Drink at least 1 glass of water with each meal and aim for at least 8 glasses per day  Exercise at least 150 minutes every week.

## 2021-07-01 NOTE — Assessment & Plan Note (Signed)
Still dealing with an acute flare.  He has had recent course of azithromycin and cefpodoxime.  We treated him with doxycycline and prednisone about a month ago.  Neither these have helped significantly.  We will update high-resolution CT scan.  He saw his pulmonologist a few weeks ago and it was ordered at that time.  We will treat with high-dose amoxicillin.  He has tolerated amoxicillin well in the past.  We will also send in promethazine-dextromethorphan cough syrup.  Discussed reasons to return to care.

## 2021-07-01 NOTE — Progress Notes (Signed)
   Bryan Tyler is a 75 y.o. male who presents today for an office visit.  Assessment/Plan:  Chronic Problems Addressed Today: Interstitial lung disease ([place]) Still dealing with an acute flare.  He has had recent course of azithromycin and cefpodoxime.  We treated him with doxycycline and prednisone about a month ago.  Neither these have helped significantly.  We will update high-resolution CT scan.  He saw his pulmonologist a few weeks ago and it was ordered at that time.  We will treat with high-dose amoxicillin.  He has tolerated amoxicillin well in the past.  We will also send in promethazine-dextromethorphan cough syrup.  Discussed reasons to return to care.    Subjective:  HPI: CC of the Pt is productive cough, which he states occurs 10x a day for the past 2 months. Additionally, he states that the prescribed medications for this have not produced any alleviation. He saw a different provider a week ago and was started on azithromycin and cefuroxime.  Symptoms have persisted.  He denies fevers or chills, but admits to SOB. However he attributes part of this to him having "pulmonary issues". He denies inhalers being useful. He is currently using Azithromycin to no effect. He states that the coughing feeling and mucus is felt in his chest, comes up and that's when he coughs it out. This sputum is clear, and he states can be in large quantities.  He has tried Tessalon in the past without significant improvement.  He has had some improvement with cough syrups in the past.  He states that he cannot take quinolones due to an enlarged artery, so his cardiologist has told him to not take the medication. He expresses interest in trying Amoxicillin as he has had no side effects with the medication in the past.  Additionally, he states that he is planning on being part of a medicine trial.       Objective:  Physical Exam: BP (!) 154/83   Pulse 89   Temp 97.9 F (36.6 C) (Temporal)   Ht 5\' 8"   (1.727 m)   Wt 184 lb 6.4 oz (83.6 kg)   SpO2 100%   BMI 28.04 kg/m   Gen: No acute distress, resting comfortably CV: Regular rate and rhythm with no murmurs appreciated Pulm: Normal work on breathing.  Speaking in full sentences.  Occasional coarse rhonchi noted but otherwise no wheezes.  Slightly diminished airflow noted. Neuro: Grossly normal, moves all extremities Psych: Normal affect and thought content      I,Jordan Kelly,acting as a scribe for Dimas Chyle, MD.,have documented all relevant documentation on the behalf of Dimas Chyle, MD,as directed by  Dimas Chyle, MD while in the presence of Dimas Chyle, MD.  I, Dimas Chyle, MD, have reviewed all documentation for this visit. The documentation on 07/01/21 for the exam, diagnosis, procedures, and orders are all accurate and complete.  Algis Greenhouse. [name], MD 07/01/2021 11:28 AM

## 2021-07-02 ENCOUNTER — Ambulatory Visit (HOSPITAL_BASED_OUTPATIENT_CLINIC_OR_DEPARTMENT_OTHER)
Admission: RE | Admit: 2021-07-02 | Discharge: 2021-07-02 | Disposition: A | Discharge status: Home / Self Care | Payer: Medicare Other | Source: Ambulatory Visit | Attending: Family Medicine | Admitting: Family Medicine

## 2021-07-02 DIAGNOSIS — R918 Other nonspecific abnormal finding of lung field: Secondary | ICD-10-CM | POA: Diagnosis not present

## 2021-07-02 DIAGNOSIS — I700 Atherosclerosis of aorta: Secondary | ICD-10-CM | POA: Diagnosis not present

## 2021-07-02 DIAGNOSIS — J849 Interstitial pulmonary disease, unspecified: Secondary | ICD-10-CM | POA: Insufficient documentation

## 2021-07-03 ENCOUNTER — Other Ambulatory Visit: Payer: Self-pay

## 2021-07-03 ENCOUNTER — Encounter: Payer: Self-pay | Admitting: Psychiatry

## 2021-07-03 ENCOUNTER — Telehealth: Payer: Self-pay | Admitting: Pulmonary Disease

## 2021-07-03 ENCOUNTER — Ambulatory Visit (INDEPENDENT_AMBULATORY_CARE_PROVIDER_SITE_OTHER): Payer: Medicare Other | Admitting: Psychiatry

## 2021-07-03 DIAGNOSIS — F3342 Major depressive disorder, recurrent, in full remission: Secondary | ICD-10-CM

## 2021-07-03 DIAGNOSIS — F411 Generalized anxiety disorder: Secondary | ICD-10-CM | POA: Diagnosis not present

## 2021-07-03 DIAGNOSIS — F331 Major depressive disorder, recurrent, moderate: Secondary | ICD-10-CM

## 2021-07-03 DIAGNOSIS — F5105 Insomnia due to other mental disorder: Secondary | ICD-10-CM

## 2021-07-03 MED ORDER — ARIPIPRAZOLE 2 MG PO TABS: 2.0000 mg | ORAL_TABLET | Freq: Every day | ORAL | 1 refills | Status: DC

## 2021-07-03 MED ORDER — CLONAZEPAM 1 MG PO TABS: ORAL_TABLET | ORAL | 1 refills | Status: DC

## 2021-07-03 MED ORDER — VENLAFAXINE HCL ER 150 MG PO CP24: 300.0000 mg | ORAL_CAPSULE | Freq: Every day | ORAL | 1 refills | Status: DC

## 2021-07-03 NOTE — Progress Notes (Signed)
Bryan Tyler [id number] 1945/11/11 75 y.o.  Subjective:   Patient ID:  Bryan Tyler is a 75 y.o. (DOB 03/24/1946) male.  Chief Complaint:  Chief Complaint  Patient presents with   Follow-up   Depression   Anxiety   Sleeping Problem    HPI Bryan Tyler presents to the office today for follow-up of history of major depression and anxiety and insomnia.  seen June 2020.  The only medication change was to reduce Abilify from 5 mg to 2 mg daily to see if tremor would improve.  He had residual anxiety but not much depression at the time.  Patient called April 22, 2019 asking for refill of clonazepam early indicating he had increased the dosages on his own to 1.5 mg instead of 1 mg.  He was informed he could not change the dosage of a controlled substance without our permission.  seen December 2020.  The following was noted: Tremor resolved with reduction in Abilify.  Wants to go through meds and their purpose. No history of RLS.   Ran out of Klonopin lately and only sleeping 3-4  Hours.  Usually sleeps oK with 1 mg nightly. Overall anxiety and depression and sleep are managed.  Because of polypharmacy we elected to try to discontinue buspirone and he was to let us know if he had any rebound anxiety.  December 07, 2019 appointment the following is noted: More trouble with sleep and Klonopin 1 mg no longer getting him to sleep and will take another 1/2 tablet.  Then runs out of the Klonopin early. No SE from 1.5 mg Klonopin. Sleep 7 hours nightly.  45 nap in day.   2 cups coffee AM, not daily other caffeine but sometimes tea with dinner.   Work 3 days week. Plan: No med changes  06/05/2020 appointment with the following noted: Rough physically, pneumonia July and then dx pulm fibrosis.  Feels not well.  No Covid.  Not smoker. Stayed on Effexor XR 300, Abilify 2 mg  And clonazepam 1.0-1.5 mg HS. Plan: He wonders about med change to try to help with the recurrent depression.   His depression had been in remission on Abilify 5 mg and then 2 mg daily. The most logical alternative would be switching from Abilify to Rexulti 1 mg for 1 week and then if no response increase to 2 mg.  He had tremors on 5 mg of Abilify so going up and Abilify is not a good option.  08/01/2020 appointment with the following noted: He called since being here indicating the [place] was making him too tired.  He was offered the option of reducing the dose or returning to Abilify. He thinks he returned to Abilify but not certain. Function OK but thinks he's still depressed.    Pulmonary fibrosis dx and RX Esbriet and it makes him feel out of it. Plan: Reduce Venlafaxine to 3 of the 75 mg capsules and add 30 mg duloxetine for 1 week, Then reduce venlafaxine to 2 of the 75 mg capsules and increase duloxetine to 2 capsules daily for 1 week, Then reduce venlafaxine to one of the 75 mg capsules and increase duloxetine to 3 capsules daily for 1 week, Then stop venlafaxine and continue 3 duloxetine daily  09/17/2020 appointment urgently scheduled by the patient: Got confused and didn't make the change to duloxetine.  Didn't know if he was supposed to take both at the same time.  Had brain fog from new med for pulmonary fibrosis  [place] and that compounded difficulty understand what to do and stopped that med too.    He doesn't want to take a change at this time.  Not necessary at this time. Plan: no med changes  11/15/2020 appt with following noted: No SE with meds except for Pulm fibrosis. No SOB if not exerting. No SE with psych meds.   Stress cousin died with debt and he's going to be the Art therapist. Depression is pretty good and  manageable.  Anxiety is residual with stress.   Pt reports couldn't go to sleep before clonazepam and when ran out. Been able to sleep with 1 mg clonazepam.  Pt reports that appetite is good. Pt reports that energy is less good.  Concentration is better than last visit.  Suicidal thoughts:  denied by patient. Plan: No indication for med change Continue venlafaxine XR 300 mg daily Abillify 2 Clonazepam 1 HS  03/20/2021 appointment with the following noted: Fairly well.  Still gets dep if idle but if busy he's OK.  Comes and goes. Anxiety is not a problem..  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 Sees th e value of each meds.  Asks if there's a prn for depressed days which there isn't P;an: No indication for med change, except ok clonazepam 1.5 mg HS bc other failures Continue venlafaxine XR 300 mg daily Abillify 2   07/03/2021 appointment with the following noted: Questions about SE gabapentin he brought readout and questions about tremor, drowsiness, tiredness.   Doesn't think he's doing as well as he'd like with depression and anxiety.  Marital problems after 40 years of marriage and she's demanding.  Feels down when he comes home to her.   She gets angry with little things he does.  Get s me down daily. Enjoys PT sub teaching.  Past Psychiatric Medication Trials: Effexor 300, Abilify 5 started 2017,  Rexulti 2 mg tired  Buspirone 15 BID, Clonazepam 1 &1/2 mg nightly Gabapentin 800 nightly Belsomra Lorazepam Mirtazapine 30 Trazodone 150  Review of Systems:  Review of Systems  Constitutional:  Positive for fatigue.  Respiratory:  Positive for shortness of breath.   Cardiovascular:  Negative for chest pain and palpitations.       Enlarged aorta with follow-up next month.  Neurological:  Negative for dizziness, tremors, seizures and weakness. Neuro said no Parkinson's dz.  Medications: I have reviewed the patient's current medications.  Current Outpatient Medications  Medication Sig Dispense Refill   albuterol (VENTOLIN HFA) 108 (90 Base) MCG/ACT inhaler Inhale 2 puffs into the lungs every 6 (six) hours as needed for wheezing or shortness of breath. 18 g 2   amoxicillin (AMOXIL) 500 MG tablet Take 2 tablets  (1,000 mg total) by mouth 2 (two) times daily for 10 days. 40 tablet 0   atorvastatin (LIPITOR) 40 MG tablet Take 1 tablet (40 mg total) by mouth every evening. 90 tablet 3   gabapentin (NEURONTIN) 800 MG tablet Take 1 tablet (800 mg total) by mouth 2 (two) times daily. 180 tablet 1   ibuprofen (ADVIL) 200 MG tablet Take 400 mg by mouth as needed.     levothyroxine (LEVOXYL) 112 MCG tablet Take 1 tablet (112 mcg total) by mouth daily. 90 tablet 3   Multiple Vitamin (MULTIVITAMIN WITH MINERALS) TABS tablet Take 1 tablet by mouth daily.     omeprazole (PRILOSEC) 40 MG capsule Take 1 capsule (40 mg total) by mouth in the morning  and at bedtime. 90 capsule 3   oxymetazoline (AFRIN) 0.05 % nasal spray Place 2 sprays into both nostrils 3 (three) times daily as needed for congestion.      promethazine-dextromethorphan (PROMETHAZINE-DM) 6.25-15 MG/5ML syrup Take 5 mLs by mouth 4 (four) times daily as needed for cough. 118 mL 0   tamsulosin (FLOMAX) 0.4 MG CAPS capsule Take 1 capsule (0.4 mg total) by mouth daily. (Patient taking differently: Take 0.4 mg by mouth every evening.) 30 capsule 3   Zoster Vaccine Adjuvanted [facility]) injection Inject into the muscle. 0.5 mL 0   ARIPiprazole (ABILIFY) 2 MG tablet Take 1 tablet (2 mg total) by mouth daily. 90 tablet 1   benzonatate (TESSALON) 200 MG capsule Take 1 capsule (200 mg total) by mouth 2 (two) times daily as needed for cough. (Patient not taking: Reported on 07/03/2021) 60 capsule 5   clonazePAM (KLONOPIN) 1 MG tablet TAKE 1.5 TABLET AT BEDTIME AS NEEDED FOR ANXIETY 135 tablet 1   venlafaxine XR (EFFEXOR-XR) 150 MG 24 hr capsule Take 2 capsules (300 mg total) by mouth daily. 180 capsule 1   No current facility-administered medications for this visit.    Medication Side Effects: Other: brief dizzy when stands up  Allergies:  Allergies  Allergen Reactions   Codeine Nausea And Vomiting and Other (See Comments)    DIZZINESS   Amoxicillin-Pot  Clavulanate    Ciprofloxacin Other (See Comments)    Unknown   Quinolones     Patient was warned about not using quinolones. Recent studies have raised concern that fluoroquinolone antibiotics could be associated with an increased risk of aortic aneurysm Fluoroquinolones have non-antimicrobial properties that might jeopardise the integrity of the extracellular matrix of the vascular wallthere was a 66% increased rate of aortic aneurysm or dissection associated with oral fluoroquinolone use, compared with amoxicillin use, within a 60 day risk period from start of treatment       Past Medical History:  Diagnosis Date   Adenocarcinoma of prostate ([place]) 04/16/2013   Aneurysm, aorta, thoracic    Anxiety    Arthritis    "knees" (05/21/2017)   Depression    Diverticulosis    Dyspnea    on exertion for a long period time   GERD (gastroesophageal reflux disease)    Hyperlipidemia    Hypertension    Hypothyroidism, postradioiodine therapy    1980's   Nocturia    Organic impotence    OSA (obstructive sleep apnea)    NO CPAP--  S/P SURGERY  2002   Osteoporosis    Pneumonia 1970   PONV (postoperative nausea and vomiting)    Urge urinary incontinence    Wears contact lenses     Family History  Problem Relation Age of Onset   Cancer Mother        Pancreatic Cancer   Heart disease Father 21       CABG    Social History   Socioeconomic History   Marital status: Married    Spouse name: Not on file   Number of children: Not on file   Years of education: Not on file   Highest education level: Not on file  Occupational History   Occupation: Middle School Teacher  Tobacco Use   Smoking status: Former    Packs/day: 1.00    Years: 10.00    Pack years: 10.00    Types: Cigarettes    Quit date: 08/11/1978    Years since quitting: 42.9   Smokeless tobacco: Never  Vaping Use   Vaping Use: Never used  Substance and Sexual Activity   Alcohol use: Yes    Comment: 05/21/2017 "1-2  beers/month"   Drug use: No   Sexual activity: Not Currently  Other Topics Concern   Not on file  Social History Narrative   Not on file   Social Determinants of Health   Financial Resource Strain: Low Risk    Difficulty of Paying Living Expenses: Not hard at all  Food Insecurity: No Food Insecurity   Worried About Charity fundraiser in the Last Year: Never true   [place] in the Last Year: Never true  Transportation Needs: No Transportation Needs   Lack of Transportation (Medical): No   Lack of Transportation (Non-Medical): No  Physical Activity: Sufficiently Active   Days of Exercise per Week: 3 days   Minutes of Exercise per Session: 90 min  Stress: Stress Concern Present   Feeling of Stress : To some extent  Social Connections: Moderately Isolated   Frequency of Communication with Friends and Family: More than three times a week   Frequency of Social Gatherings with Friends and Family: More than three times a week   Attends Religious Services: Never   Marine scientist or Organizations: No   Attends Music therapist: Never   Marital Status: Married  Human resources officer Violence: Not At Risk   Fear of Current or Ex-Partner: No   Emotionally Abused: No   Physically Abused: No   Sexually Abused: No    Past Medical History, Surgical history, Social history, and Family history were reviewed and updated as appropriate.   Please see review of systems for further details on the patient's review from today.   Objective:   Physical Exam:  There were no vitals taken for this visit.  Physical Exam Constitutional:      General: He is not in acute distress. Musculoskeletal:        General: No deformity.  Neurological:     Mental Status: He is alert and oriented to person, place, and time.     Coordination: Coordination normal.     Comments: Tremor minimal  Psychiatric:        Attention and Perception: Attention and perception normal. He does not  perceive auditory or visual hallucinations.        Mood and Affect: Mood is anxious and depressed. Affect is not labile, blunt, angry or inappropriate.        Speech: Speech normal.        Behavior: Behavior normal.        Thought Content: Thought content normal. Thought content does not include homicidal or suicidal ideation. Thought content does not include homicidal or suicidal plan.        Cognition and Memory: Cognition and memory normal.        Judgment: Judgment normal.     Comments: Insight intact. No delusions.  Residual managed anxiety and depression.    Lab Review:     Component Value Date/Time   NA 136 06/11/2021 1051   NA 137 11/16/2019 1001   K 4.0 06/11/2021 1051   CL 99 06/11/2021 1051   CO2 28 06/11/2021 1051   GLUCOSE 122 (H) 06/11/2021 1051   BUN 15 06/11/2021 1051   BUN 12 11/16/2019 1001   CREATININE 0.87 06/11/2021 1051   CREATININE 0.86 05/25/2020 0959   CALCIUM 9.4 06/11/2021 1051   CALCIUM 9.8 07/09/2011 0819   PROT 7.5 06/11/2021  1051   ALBUMIN 4.3 06/11/2021 1051   AST 20 06/11/2021 1051   ALT 18 06/11/2021 1051   ALKPHOS 76 06/11/2021 1051   BILITOT 0.5 06/11/2021 1051   GFRNONAA 85 11/16/2019 1001   GFRAA 98 11/16/2019 1001       Component Value Date/Time   WBC 15.4 (H) 06/11/2021 1051   RBC 4.19 (L) 06/11/2021 1051   HGB 12.6 (L) 06/11/2021 1051   HCT 38.5 (L) 06/11/2021 1051   PLT 335.0 06/11/2021 1051   MCV 91.9 06/11/2021 1051   MCH 30.9 05/25/2020 0959   MCHC 32.8 06/11/2021 1051   RDW 13.7 06/11/2021 1051   LYMPHSABS 2.8 06/11/2021 1051   MONOABS 1.6 (H) 06/11/2021 1051   EOSABS 0.0 06/11/2021 1051   BASOSABS 0.0 06/11/2021 1051    No results found for: POCLITH, LITHIUM   No results found for: PHENYTOIN, PHENOBARB, VALPROATE, CBMZ   .res Assessment: Plan:    Bryan Tyler was seen today for follow-up, depression, anxiety and sleeping problem.  Diagnoses and all orders for this visit:  Major depressive disorder, recurrent  episode, moderate (HCC) -     venlafaxine XR (EFFEXOR-XR) 150 MG 24 hr capsule; Take 2 capsules (300 mg total) by mouth daily.  Insomnia due to mental condition -     clonazePAM (KLONOPIN) 1 MG tablet; TAKE 1.5 TABLET AT BEDTIME AS NEEDED FOR ANXIETY  Generalized anxiety disorder -     venlafaxine XR (EFFEXOR-XR) 150 MG 24 hr capsule; Take 2 capsules (300 mg total) by mouth daily.  Recurrent major depressive disorder, in full remission ([place]) -     ARIPiprazole (ABILIFY) 2 MG tablet; Take 1 tablet (2 mg total) by mouth daily.   Greater than 50% of 30 min face to face time with patient was spent on counseling and coordination of care. We discussed that Bryan Tyler has a long history of depression and anxiety the has been fairly well controlled in recent years.  He was on the maximum dosage of venlafaxine and when he had a recurrence of depression in 2017, aripiprazole 2 mg was added.   His depression had been in remission on Abilify 5 mg and then 2 mg daily.   Unfortunately no prn for med and disc management of depression behaviorally.  Disc potential causes of tremor in detail including high dose Effexor.  Answered questions about gabapentin and SE possible tiredness and sleepiness related.  Rx by pulmonary MD.  Suggest he contact them to see if trial reduction is appropriate.  No indication for med change Continue venlafaxine XR 300 mg daily Abillify 2 Continue clonazepam 1.5 mg HS per his request.  Take LED  Option change Effexor to duloxetine or Trintellix.   Disc SE in detail and SSRI withdrawal sx. Option buspirone.  Extensive discussion of sleep hygiene.  No caffeine after 3 pm.    Disc marital problems and potential areas of improvement.  Consider counseling.  Can't fix marriage with med changes. Disc risk trial reduction Effexor to see if tremor is better.  We discussed the short-term risks associated with benzodiazepines including sedation and increased fall risk among others.   Discussed long-term side effect risk including dependence, potential withdrawal symptoms, and the potential eventual dose-related risk of dementia.  Also disc risk affecting balance and memory.  No higher dose than 1-1.5 mg but that dose will be allowed since failed other sleepers and tolerating it.  Cautioned again against exceeding dosage of clonazepam without my OK.  Discussed potential metabolic side effects  associated with atypical antipsychotics, as well as potential risk for movement side effects. Advised pt to contact office if movement side effects occur.   Follow-up 6 mos  Lynder Parents, MD, DFAPA    Future Appointments  Date Time Provider [place]  06/20/2022 11:00 AM [facility]     No orders of the defined types were placed in this encounter.   -------------------------------

## 2021-07-03 NOTE — Telephone Encounter (Signed)
Pt does not need to have HRCT in Dec since pt just had one done 11/22. I have cancelled the order. Nothing further needed.

## 2021-07-08 ENCOUNTER — Telehealth: Payer: Self-pay

## 2021-07-08 NOTE — Telephone Encounter (Signed)
Pt called for CAT Scan results. Please Advise.

## 2021-07-08 NOTE — Progress Notes (Signed)
Please inform patient of the following:  His CT scan shows progression of his interstitial lung disease. There are no signs of pneumonia. His CT scan also shows that his aortic enlargement is stable.   His cough is probably due to his underlying lung disease. He needs to follow back up with pulmonology.  Bryan Tyler. [name], MD 07/08/2021 8:01 AM

## 2021-07-08 NOTE — Telephone Encounter (Signed)
See results note. 

## 2021-07-10 ENCOUNTER — Telehealth: Payer: Self-pay | Admitting: Psychiatry

## 2021-07-10 ENCOUNTER — Other Ambulatory Visit: Payer: Self-pay

## 2021-07-10 ENCOUNTER — Other Ambulatory Visit: Payer: Self-pay | Admitting: Family Medicine

## 2021-07-10 DIAGNOSIS — F5105 Insomnia due to other mental disorder: Secondary | ICD-10-CM

## 2021-07-10 DIAGNOSIS — F3342 Major depressive disorder, recurrent, in full remission: Secondary | ICD-10-CM

## 2021-07-10 DIAGNOSIS — F331 Major depressive disorder, recurrent, moderate: Secondary | ICD-10-CM

## 2021-07-10 DIAGNOSIS — F411 Generalized anxiety disorder: Secondary | ICD-10-CM

## 2021-07-10 NOTE — Telephone Encounter (Signed)
Pended.

## 2021-07-10 NOTE — Telephone Encounter (Signed)
Pt called and would like the scripts from last week for Aripiprazole,Venlafaxine and Clonazepam sent to [facility].  He says he has enough to get through the processing and delivery time for them.   Next appt 3/23

## 2021-07-12 NOTE — Telephone Encounter (Signed)
Looks like [facility].The pt informed me it has to go there.Is there a reason why it can't so I can let him know?

## 2021-07-15 ENCOUNTER — Telehealth: Payer: Self-pay | Admitting: Psychiatry

## 2021-07-15 ENCOUNTER — Other Ambulatory Visit: Payer: Self-pay

## 2021-07-15 DIAGNOSIS — F5105 Insomnia due to other mental disorder: Secondary | ICD-10-CM

## 2021-07-15 MED ORDER — CLONAZEPAM 1 MG PO TABS: ORAL_TABLET | ORAL | 0 refills | Status: DC

## 2021-07-15 NOTE — Telephone Encounter (Signed)
Next visit is 10/31/21. [facility] called and said that they got the refill request for Clonazepam but when it was faxed there was not a signature or any directions listed on the prescription. Do they need to fill it or is it denied? Please call [phone] and use reference # [id number].

## 2021-07-15 NOTE — Telephone Encounter (Signed)
Called [facility]. I had refused the refill because he had it filled at a local pharmacy. They said the form was blank, but after I told them I refused it they saw the note on it.

## 2021-07-22 ENCOUNTER — Other Ambulatory Visit (HOSPITAL_BASED_OUTPATIENT_CLINIC_OR_DEPARTMENT_OTHER): Payer: Self-pay

## 2021-07-22 MED ORDER — ZOSTER VAC RECOMB ADJUVANTED 50 MCG/0.5ML IM SUSR
INTRAMUSCULAR | 0 refills | Status: DC
  Filled 2021-07-22: qty 0.5, 1d supply, fill #0

## 2021-07-29 NOTE — Telephone Encounter (Signed)
Per Delsa Sale, this encounter can be closed.

## 2021-08-16 ENCOUNTER — Encounter: Payer: Medicare Other | Admitting: Internal Medicine

## 2021-08-21 ENCOUNTER — Ambulatory Visit (INDEPENDENT_AMBULATORY_CARE_PROVIDER_SITE_OTHER): Payer: Medicare Other | Admitting: Family Medicine

## 2021-08-21 ENCOUNTER — Encounter: Payer: Self-pay | Admitting: Family Medicine

## 2021-08-21 ENCOUNTER — Other Ambulatory Visit: Payer: Self-pay

## 2021-08-21 VITALS — BP 140/80 | HR 92 | Temp 97.5°F | Ht 68.0 in | Wt 189.0 lb

## 2021-08-21 DIAGNOSIS — J849 Interstitial pulmonary disease, unspecified: Secondary | ICD-10-CM

## 2021-08-21 DIAGNOSIS — J3489 Other specified disorders of nose and nasal sinuses: Secondary | ICD-10-CM

## 2021-08-21 MED ORDER — FLUTICASONE PROPIONATE 50 MCG/ACT NA SUSP: 2.0000 | Freq: Every day | NASAL | 6 refills | Status: AC

## 2021-08-21 MED ORDER — AZELASTINE HCL 0.1 % NA SOLN: 2.0000 | Freq: Two times a day (BID) | NASAL | 12 refills | Status: AC

## 2021-08-21 NOTE — Progress Notes (Signed)
Subjective:     Patient ID: Bryan Tyler, male    DOB: [birth year], 76 y.o.   MRN: [id number]  Chief Complaint  Patient presents with   Nasal drainage    Mucus drainage for 2 and a half months, has done antibiotics with no help     HPI 2.5 mo of nasal d/c.  No reflief w/abx-2-3 courses and prednisone-constant rhinorrhea and thick mucus and post nasal drip that will cause coughing.   Using afrin PRN-2-3x/day Has IPF as well.  Seeing pulm next month. Has been on doxy/steroids, cefpodoxime/azithromycin. amoxicillin  Health Maintenance Due  Topic Date Due   COVID-19 Vaccine (4 - Booster for Pfizer series) 07/02/2020   Zoster Vaccines- Shingrix (2 of 2) 07/17/2021    Past Medical History:  Diagnosis Date   Adenocarcinoma of prostate ([place]) 04/16/2013   Aneurysm, aorta, thoracic    Anxiety    Arthritis    "knees" (05/21/2017)   Depression    Diverticulosis    Dyspnea    on exertion for a long period time   GERD (gastroesophageal reflux disease)    Hyperlipidemia    Hypertension    Hypothyroidism, postradioiodine therapy    1980's   Nocturia    Organic impotence    OSA (obstructive sleep apnea)    NO CPAP--  S/P SURGERY  2002   Osteoporosis    Pneumonia 1970   PONV (postoperative nausea and vomiting)    Urge urinary incontinence    Wears contact lenses     Past Surgical History:  Procedure Laterality Date   HEMORRHOIDECTOMY WITH HEMORRHOID BANDING  2007   INGUINAL HERNIA REPAIR Bilateral    x4  (2 each side)   INSERTION PROSTATE RADIATION SEED     KNEE ARTHROSCOPY Right 2012   NASAL SEPTUM SURGERY  1982   w/ rhinoplasty   PROSTATE BIOPSY     RADIOACTIVE SEED IMPLANT N/A 05/30/2013   Procedure: RADIOACTIVE SEED IMPLANT;  Surgeon: Hanley Ben, MD;  Location: [place];  Service: Urology;  Laterality: N/A;   REVERSE SHOULDER ARTHROPLASTY Right 05/21/2017   REVERSE SHOULDER ARTHROPLASTY Right 05/21/2017   REVERSE SHOULDER ARTHROPLASTY Right  05/21/2017   Procedure: REVERSE RIGHT SHOULDER ARTHROPLASTY;  Surgeon: Justice Britain, MD;  Location: [place];  Service: Orthopedics;  Laterality: Right;   RHINOPLASTY  1982   w/w/ septoplasty   SHOULDER ARTHROSCOPY W/ ROTATOR CUFF REPAIR Right 2004   SHOULDER ARTHROSCOPY W/ ROTATOR CUFF REPAIR Left 2016   TONSILLECTOMY  AS CHILD   TOTAL KNEE ARTHROPLASTY Right 09/24/2018   Procedure: TOTAL KNEE ARTHROPLASTY;  Surgeon: Sydnee Cabal, MD;  Location: [facility];  Service: Orthopedics;  Laterality: Right;  Adductor Block   UVULOPALATOPHARYNGOPLASTY  2002    Outpatient Medications Prior to Visit  Medication Sig Dispense Refill   albuterol (VENTOLIN HFA) 108 (90 Base) MCG/ACT inhaler Inhale 2 puffs into the lungs every 6 (six) hours as needed for wheezing or shortness of breath. 18 g 2   ARIPiprazole (ABILIFY) 2 MG tablet Take 1 tablet (2 mg total) by mouth daily. 90 tablet 1   atorvastatin (LIPITOR) 40 MG tablet Take 1 tablet (40 mg total) by mouth every evening. 90 tablet 3   benzonatate (TESSALON) 200 MG capsule Take 1 capsule (200 mg total) by mouth 2 (two) times daily as needed for cough. 60 capsule 5   clonazePAM (KLONOPIN) 1 MG tablet TAKE 1.5 TABLET AT BEDTIME AS NEEDED FOR ANXIETY 135 tablet 0   gabapentin (NEURONTIN)  800 MG tablet Take 1 tablet (800 mg total) by mouth 2 (two) times daily. 180 tablet 1   ibuprofen (ADVIL) 200 MG tablet Take 400 mg by mouth as needed.     levothyroxine (SYNTHROID) 112 MCG tablet TAKE 1 TABLET DAILY 90 tablet 3   Multiple Vitamin (MULTIVITAMIN WITH MINERALS) TABS tablet Take 1 tablet by mouth daily.     omeprazole (PRILOSEC) 40 MG capsule Take 1 capsule (40 mg total) by mouth in the morning and at bedtime. 90 capsule 3   oxymetazoline (AFRIN) 0.05 % nasal spray Place 2 sprays into both nostrils 3 (three) times daily as needed for congestion.      tamsulosin (FLOMAX) 0.4 MG CAPS capsule Take 1 capsule (0.4 mg total) by mouth daily. (Patient taking differently:  Take 0.4 mg by mouth every evening.) 30 capsule 3   Tdap (BOOSTRIX) 5-2.5-18.5 LF-MCG/0.5 injection      venlafaxine XR (EFFEXOR-XR) 150 MG 24 hr capsule Take 2 capsules (300 mg total) by mouth daily. 180 capsule 1   COVID-19 mRNA vaccine, Pfizer, 30 MCG/0.3ML injection      influenza vaccine adjuvanted (FLUAD QUADRIVALENT) 0.5 ML injection      Zoster Vaccine Adjuvanted [facility]) injection Inject into the muscle. 0.5 mL 0   promethazine-dextromethorphan (PROMETHAZINE-DM) 6.25-15 MG/5ML syrup Take 5 mLs by mouth 4 (four) times daily as needed for cough. 118 mL 0   No facility-administered medications prior to visit.    Allergies  Allergen Reactions   Codeine Nausea And Vomiting and Other (See Comments)    DIZZINESS   Ciprofloxacin Other (See Comments)    Unknown   Quinolones     Patient was warned about not using quinolones. Recent studies have raised concern that fluoroquinolone antibiotics could be associated with an increased risk of aortic aneurysm Fluoroquinolones have non-antimicrobial properties that might jeopardise the integrity of the extracellular matrix of the vascular wallthere was a 66% increased rate of aortic aneurysm or dissection associated with oral fluoroquinolone use, compared with amoxicillin use, within a 60 day risk period from start of treatment      OIZ:[id number] except as noted in HPI      Objective:     BP 140/80    Pulse 92    Temp (!) 97.5 F (36.4 C) (Temporal)    Ht 5\' 8"  (1.727 m)    Wt 189 lb (85.7 kg)    SpO2 94%    BMI 28.74 kg/m  Wt Readings from Last 3 Encounters:  08/21/21 189 lb (85.7 kg)  07/01/21 184 lb 6.4 oz (83.6 kg)  06/24/21 184 lb 9.6 oz (83.7 kg)        Gen: WDWN NAD HEENT: NCAT, conjunctiva not injected, sclera nonicteric TM WNL B, OP moist, no exudates .  Possibly polyp R nose NECK:  supple, no thyromegaly, no nodes, no carotid bruits CARDIAC: RRR, S1S2+, no murmur. DP 2+B LUNGS: CTAB. No wheezes but  crackles and inc breathing ABDOMEN:  BS+, soft, NTND, No HSM, no masses EXT:  no edema MSK: no gross abnormalities.  NEURO: A&O x3.  CN II-XII intact.  PSYCH: normal mood. Good eye contact  Assessment & Plan:   Problem List Items Addressed This Visit       Respiratory   Interstitial lung disease ([place]) (Chronic)   Other Visit Diagnoses     Rhinorrhea    -  Primary      Rhinorrhea-resistant to pred and 3 rounds of abx.  Stop afrin  will do  flonase.  CT sinuses.  To ENT IPF-seeing pulm  No orders of the defined types were placed in this encounter.   Wellington Hampshire, MD

## 2021-08-21 NOTE — Patient Instructions (Signed)
It was very nice to see you today!  Prescription sent to pharmacy.   Stop afrin You will get call to schedule CT sinus and ENT   PLEASE NOTE:  If you had any lab tests please let us know if you have not heard back within a few days. You may see your results on MyChart before we have a chance to review them but we will give you a call once they are reviewed by [place]. If we ordered any referrals today, please let us know if you have not heard from their office within the next week.   Please try these tips to maintain a healthy lifestyle:  Eat most of your calories during the day when you are active. Eliminate processed foods including packaged sweets (pies, cakes, cookies), reduce intake of potatoes, white bread, white pasta, and white rice. Look for whole grain options, oat flour or almond flour.  Each meal should contain half fruits/vegetables, one quarter protein, and one quarter carbs (no bigger than a computer mouse).  Cut down on sweet beverages. This includes juice, soda, and sweet tea. Also watch fruit intake, though this is a healthier sweet option, it still contains natural sugar! Limit to 3 servings daily.  Drink at least 1 glass of water with each meal and aim for at least 8 glasses per day  Exercise at least 150 minutes every week.

## 2021-09-02 DIAGNOSIS — J341 Cyst and mucocele of nose and nasal sinus: Secondary | ICD-10-CM | POA: Diagnosis not present

## 2021-09-02 DIAGNOSIS — J3489 Other specified disorders of nose and nasal sinuses: Secondary | ICD-10-CM | POA: Diagnosis not present

## 2021-09-02 DIAGNOSIS — R053 Chronic cough: Secondary | ICD-10-CM | POA: Diagnosis not present

## 2021-09-02 DIAGNOSIS — K219 Gastro-esophageal reflux disease without esophagitis: Secondary | ICD-10-CM | POA: Diagnosis not present

## 2021-09-02 DIAGNOSIS — H9313 Tinnitus, bilateral: Secondary | ICD-10-CM | POA: Diagnosis not present

## 2021-09-02 DIAGNOSIS — J329 Chronic sinusitis, unspecified: Secondary | ICD-10-CM | POA: Diagnosis not present

## 2021-09-03 ENCOUNTER — Encounter (INDEPENDENT_AMBULATORY_CARE_PROVIDER_SITE_OTHER): Payer: Medicare Other | Admitting: Internal Medicine

## 2021-09-03 ENCOUNTER — Other Ambulatory Visit: Payer: Self-pay

## 2021-09-03 ENCOUNTER — Encounter: Payer: Medicare Other | Admitting: *Deleted

## 2021-09-03 VITALS — BP 120/75 | HR 72 | Temp 97.8°F | Resp 26 | Ht 69.0 in | Wt 188.0 lb

## 2021-09-03 DIAGNOSIS — Z006 Encounter for examination for normal comparison and control in clinical research program: Secondary | ICD-10-CM

## 2021-09-03 DIAGNOSIS — J84112 Idiopathic pulmonary fibrosis: Secondary | ICD-10-CM

## 2021-09-03 NOTE — Research (Signed)
Title: FGCL-3019-095 (FibroGen Study) is a Phase 3, randomized, double-blind, placebo-controlled multicenter international study to evaluate the efficacy and safety of 30 mg/kg IV infusions of pamrevlumab administered every 3 weeks for 52 weeks as compared to placebo in subjects with Idiopathic Pulmonary Fibrosis. Primary end point is: change in FVC from baseline at week 52. ZEPHYRUS II STUDY  Protocol #: FGCL-3019-095, Clinical Trials #: [id number] Sponsor: [email or web address]  ([place], [place], [place])  Protocol: Protocol Amendment 1.3 [id number] IB: version 19 dated 05NOV2021 Main ICF: version 06JUN2022 Optional Genetic Consent: version [id number]  Key Features of Pamrevlumab (FG-3019) the study drug: a recombinant fully human IgG kappa monoclonal antibody that binds to CTGF and is being developed for treatment of diseases in which tissue fibrosis has a major pathogenic role. In particular, pamrevlumab appears to disrupt a CTGF autocrine loop in mesenchymal cells like myofibroblasts that reduces their recruitment of leukocytes like macrophages, mast and dendritic cells via chemokine secretion. This disruption results in collapse of the cellular crosstalk that drives tissue remodeling.  Key Inclusion Criteria: Age 70 to 48 years Diagnosis of IPF within the past 7 years  Interstitial pulmonary fibrosis defined by HRCT scan at Screening, with evidence of =10% to <50% parenchymal fibrosis and <25% honeycombing, within the whole lung. Not currently receiving treatment for IPF with approved therapy. a. FVC value =45% and =95% of predicted at screening b. DLCO percent of predicted and corrected by Hb value =25% and =90% at screening The extent of fibrosis is greater than the extent of emphysema on HRCT. Male subjects with partners of childbearing potential and male subjects of childbearing potential (including those <1 year postmenopausal) must use double barrier contraception methods during conduct of  study, and for 3 months after last dose of study drug.   Key Exclusion Criteria: Previous exposure to pamrevlumab; Ongoing acute IPF exacerbation, or suspicion of such process, during Screening or Randomization; Interstitial lung disease other than IPF; Poorly controlled chronic heart failure; clinical diagnosis of cor pulmonale requiring specific treatment; or severe pulmonary arterial hypertension; Smoking within 3 months of Screening and/or unwilling to avoid smoking throughout the study; Use of any investigational drugs, for IPF or not, within the 30 days prior to screening initiation. Or use of approved IPF therapies within 1 week prior to screening; High likelihood of lung transplantation within 6 months after Day 1; Any history of malignancy likely to result in significant disability or mortality likely to require significant medical or surgical intervention within the next 2 years. This does not include minor surgical procedures for localized cancer; Daily use of PDE-5 inhibitor drugs [e.g. sildenafil, tadalafil, other]. (Note: Intermittent use of one type for erectile dysfunction or severe pulmonary hypertension is allowed).  Mechanism of Action: Pamrevlumab is a human recombinant IgG monoclonal antibody that binds to connective tissue growth factor (CTGF).  CTGF plays a role by mediating the process of fibrosis. By binding to CTGF, pamrevlumab blocks its biologic activity; thereby preventing cell proliferation, adhesion and migration of growth factors involved in fibrotic changes. It is also being studied in other conditions where fibrotic changes play a role; liver fibrosis, Duchenne muscular dystrophy and certain cancers.   Half-life: The effective terminal elimination half-life at the 30 mg/kg dose level was calculated to be  10.1 days after a single dose and 12.2 days after seven doses in patients with IPF (from Study FGCL-3019-049).   Interactions No known drug interactions. All  concomitant medications to be reviewed by PI.   Safety Data: Investigators Brochure, Edition  19.0 1199 subjects have been exposed to pamrevlumab, 501 with IPF The most common TEAEs in all subjects with IPF: (269 IPF patients have been exposed = 12 weeks, 182 patients = 24 weeks and 78 patients = 52 weeks) Cough, fatigue, dyspnea, upper respiratory tract infection, bronchitis, nasopharyngitis No known effect on qtc prolongation, renal or hepatic issues A total of 29 patients discontinued therapy ( 9 due to disease progression, the other 20 due to  abdominal pain, gastrointestinal hemorrhage, pulmonary embolism, sepsis, renal  failure, and dyspnea.  Xxxxxxxxxxxxxxxxxxxxx [facility] meeting Aug 2021 - cleared to continue Xxxxxxxxxxxxxx Updates: - Sep2021: 1 anaphylactic reaction on 10th infusion on Duchenne patient in [place] - causally related - 2.04% incidence of PE (between IPF 6 patients, and 8 with pancreatic cancer of 685 in studye with 562 thought to be on IP) - possible cause by IP, but sponsor believes no changes to study program Bryan Tyler [place] [id number] for protocol to continue without changes   14-Jun-2021: As of this date 2 cases of Serious Adverse Event (SAE) anaphylactic reaction reported. The sponsor does not believe that changes to the conduct of this study are warranted at this time.  26Sep2022: Embryo fetal development study findings in male rabbits treated with pamrevlumab determined potential risks for pregnant male patients that could cause fetal abnormalities. Per sponsor, there are no recommended changes to safety monitoring, study procedures, or study conduct as a result of this finding. Per PI, no safety or data integrity has been compromised.    30-Aug-2020: Serious Adverse Event (SAE) described a case of toxic myocarditis. The sponsor does not believe that changes to the conduct of this study are warranted at this time. xxxxxxxxxxxxxxxxxxxxxxxxxxxxxxxx   Clinical  Trials in IPF: Phase 1 study Study [id number], n=21 Enrolled 21 subjects with IPF 3 deaths occurred due to disease progression. None of the deaths were considered related to study drug.  No dose-limiting toxicities All adverse events were considered mild to moderate 76% of subjects experienced at least 1 TEAE The most common TEAEs: Pyrexia (n=3, 14% of subjects) Cough (n=3, 14% of subjects) Dyspnea (n=3, 14% of subjects) Respiratory tract infection (n=2, 9% of subjects)   Phase 2 study Study FGCL-3019-049, n=90 Enrolled 90 subjects with IPF 14 deaths occurred, 13 were deemed to be related to IPF 20% of subjects experienced a TEAE that led to study drug discontinuation IPF and respiratory failure were the two most common reasons for discontinuation; occurring in 8% and 3% of patients, respectively The most common TEAEs: Cough (n=34, 38% of subjects) Dyspnea (n=24, 27% of subjects) Fatigue (n=24, 27% of subjects) Nasopharyngitis (n=20, 22% of subjects) Respiratory tract infection (n=19, 21% of subjects) Bronchitis (n=18, 20% of subjects)   Phase 2 study Study FGCL-3019-067, n=103 103 subjects enrolled with IPF 9 deaths occurred; 4 deemed related to IPF, 5 related to other respiratory causes 18% of subjects experienced a TEAE that led to study drug discontinuation The most common TEAEs: Cough (n=48, 47% of subjects) Respiratory tract infection (n=39, 38% of subjects) IPF (n=32, 31% of subjects) Dysnpea (n=30, 29% of subjects) Sinusitis (n=21, 21% of subjects) Fatigue (n=20, 19% of subjects)  Overall, pamrevlumab has been well tolerated. Infusion-related reactions have been reported at a rate that is consistent with other human monoclonal antibodies. Based on the mechanism of action of pamrevlumab, by inhibiting CTGF, there was some concern that this would cause impaired wound healing or impaired bone fracture healing. However, there were no serious adverse events reported in  any  study relating to these two issues. Results from studies in subjects with IPF suggested that pamrevlumab slowed the progression of IPF as measured by change in ppFVC, quantitative analysis of fibrosis using HRCT, and time to disease progression or death.   [facility] [place] Coordinator note :   This visit for Subject Bryan Tyler with DOB: 10/25/1945 on 09/03/2021 for the above protocol is the Screening visit and is for purpose of research.   The consent for this encounter is under Protocol: Protocol Amendment 1.3 [id number] IB: version 19 dated 05NOV2021 Main ICF: version 06JUN2022 Optional Genetic Consent: version [id number]  Subject expressed continued interest and consent in continuing as a study subject. Subject confirmed that there was no change in contact information (e.g. address, telephone, email). Subject thanked for participation in research and contribution to science.  In this visit 09/03/2021 the subject will be evaluated by Principal Investigator named Dr. Brand Males. This research coordinator has verified that the above investigator is up to date with his training logs.   The Subject was informed that the PI Dr. Chase Caller continues to have oversight of the subject's visits and course through relevant discussions, reviews and also specifically of this visit by routing of this note to the [facility].    This visit is a key visit of screening. The PI is available for this visit. Subject was consented to ICF for Main Study and the Optional Genetic Consent. PI, Dr. Chase Caller participated in the consent process. The subject appeared well and reported no complaints. All procedures completed per the above mentioned protocol. Refer to the subject's paper source binder for further details for the consent process and protocol procedures.   Signed by  Lytle Michaels Clinical Research Coordinator [facility]  [place], [place] 2:07 PM 09/03/2021

## 2021-09-03 NOTE — Progress Notes (Signed)
Title: FGCL-3019-095 (FibroGen Study) is a Phase 3, randomized, double-blind, placebo-controlled multicenter international study to evaluate the efficacy and safety of 30 mg/kg IV infusions of pamrevlumab administered every 3 weeks for 52 weeks as compared to placebo in subjects with Idiopathic Pulmonary Fibrosis. Primary end point is: change in FVC from baseline at week 52. ZEPHYRUS II STUDY  Key Features of Pamrevlumab (FG-3019) the study drug: a recombinant fully human IgG kappa monoclonal antibody that binds to CTGF and is being developed for treatment of diseases in which tissue fibrosis has a major pathogenic role. In particular, pamrevlumab appears to disrupt a CTGF autocrine loop in mesenchymal cells like myofibroblasts that reduces their recruitment of leukocytes like macrophages, mast and dendritic cells via chemokine secretion. This disruption results in collapse of the cellular crosstalk that drives tissue remodeling.  Key Inclusion Criteria: Age 57 to 83 years Diagnosis of IPF within the past 7 years  Interstitial pulmonary fibrosis defined by HRCT scan at Screening, with evidence of =10% to <50% parenchymal fibrosis and <25% honeycombing, within the whole lung. Not currently receiving treatment for IPF with approved therapy. a. FVC value =45% and =95% of predicted at screening b. DLCO percent of predicted and corrected by Hb value =25% and =90% at screening The extent of fibrosis is greater than the extent of emphysema on HRCT. Male subjects with partners of childbearing potential and male subjects of childbearing potential (including those <1 year postmenopausal) must use double barrier contraception methods during conduct of study, and for 3 months after last dose of study drug.   Key Exclusion Criteria: Previous exposure to pamrevlumab; Ongoing acute IPF exacerbation, or suspicion of such process, during Screening or Randomization; Interstitial lung disease other than  IPF; Poorly controlled chronic heart failure; clinical diagnosis of cor pulmonale requiring specific treatment; or severe pulmonary arterial hypertension; Smoking within 3 months of Screening and/or unwilling to avoid smoking throughout the study; Use of any investigational drugs, for IPF or not, within the 30 days prior to screening initiation. Or use of approved IPF therapies within 1 week prior to screening; High likelihood of lung transplantation within 6 months after Day 1; Any history of malignancy likely to result in significant disability or mortality likely to require significant medical or surgical intervention within the next 2 years. This does not include minor surgical procedures for localized cancer; Daily use of PDE-5 inhibitor drugs [e.g. sildenafil, tadalafil, other]. (Note: Intermittent use of one type for erectile dysfunction or severe pulmonary hypertension is allowed).  Mechanism of Action: Pamrevlumab is a human recombinant IgG monoclonal antibody that binds to connective tissue growth factor (CTGF).  CTGF plays a role by mediating the process of fibrosis. By binding to CTGF, pamrevlumab blocks its biologic activity; thereby preventing cell proliferation, adhesion and migration of growth factors involved in fibrotic changes. It is also being studied in other conditions where fibrotic changes play a role; liver fibrosis, Duchenne muscular dystrophy and certain cancers.   Half-life: The effective terminal elimination half-life at the 30 mg/kg dose level was calculated to be  10.1 days after a single dose and 12.2 days after seven doses in patients with IPF (from Study FGCL-3019-049).   Interactions No known drug interactions. All concomitant medications to be reviewed by PI.   Safety Data: Investigators Brochure, Edition 19.0 1199 subjects have been exposed to pamrevlumab, 501 with IPF The most common TEAEs in all subjects with IPF: (269 IPF patients have been exposed = 12  weeks, 182 patients = 24 weeks and  78 patients = 52 weeks) Cough, fatigue, dyspnea, upper respiratory tract infection, bronchitis, nasopharyngitis No known effect on qtc prolongation, renal or hepatic issues A total of 29 patients discontinued therapy ( 9 due to disease progression, the other 20 due to  abdominal pain, gastrointestinal hemorrhage, pulmonary embolism, sepsis, renal  failure, and dyspnea.  Xxxxxxxxxxxxxxxxxxxxx [facility] meeting Aug 2021 - cleared to continue Xxxxxxxxxxxxxx Updates: - Sep2021: 1 anaphylactic reaction on 10th infusion on Duchenne patient in [place] - causally related - 2.04% incidence of PE (between IPF 6 patients, and 8 with pancreatic cancer of 685 in studye with 562 thought to be on IP) - possible cause by IP, but sponsor believes no changes to study program [name] [id number] for protocol to continue without changes   14-Jun-2021: As of this date 2 cases of Serious Adverse Event (SAE) anaphylactic reaction reported. The sponsor does not believe that changes to the conduct of this study are warranted at this time.  26Sep2022: Embryo fetal development study findings in male rabbits treated with pamrevlumab determined potential risks for pregnant male patients that could cause fetal abnormalities. Per sponsor, there are no recommended changes to safety monitoring, study procedures, or study conduct as a result of this finding. Per PI, no safety or data integrity has been compromised.    30-Aug-2020: Serious Adverse Event (SAE) described a case of toxic myocarditis. The sponsor does not believe that changes to the conduct of this study are warranted at this time. xxxxxxxxxxxxxxxxxxxxxxxxxxxxxxxx   Clinical Trials in IPF: Phase 1 study Study [id number], n=21 Enrolled 21 subjects with IPF 3 deaths occurred due to disease progression. None of the deaths were considered related to study drug.  No dose-limiting toxicities All adverse events were  considered mild to moderate 76% of subjects experienced at least 1 TEAE The most common TEAEs: Pyrexia (n=3, 14% of subjects) Cough (n=3, 14% of subjects) Dyspnea (n=3, 14% of subjects) Respiratory tract infection (n=2, 9% of subjects)   Phase 2 study Study FGCL-3019-049, n=90 Enrolled 90 subjects with IPF 14 deaths occurred, 13 were deemed to be related to IPF 20% of subjects experienced a TEAE that led to study drug discontinuation IPF and respiratory failure were the two most common reasons for discontinuation; occurring in 8% and 3% of patients, respectively The most common TEAEs: Cough (n=34, 38% of subjects) Dyspnea (n=24, 27% of subjects) Fatigue (n=24, 27% of subjects) Nasopharyngitis (n=20, 22% of subjects) Respiratory tract infection (n=19, 21% of subjects) Bronchitis (n=18, 20% of subjects)   Phase 2 study Study FGCL-3019-067, n=103 103 subjects enrolled with IPF 9 deaths occurred; 4 deemed related to IPF, 5 related to other respiratory causes 18% of subjects experienced a TEAE that led to study drug discontinuation The most common TEAEs: Cough (n=48, 47% of subjects) Respiratory tract infection (n=39, 38% of subjects) IPF (n=32, 31% of subjects) Dysnpea (n=30, 29% of subjects) Sinusitis (n=21, 21% of subjects) Fatigue (n=20, 19% of subjects)  Overall, pamrevlumab has been well tolerated. Infusion-related reactions have been reported at a rate that is consistent with other human monoclonal antibodies. Based on the mechanism of action of pamrevlumab, by inhibiting CTGF, there was some concern that this would cause impaired wound healing or impaired bone fracture healing. However, there were no serious adverse events reported in any study relating to these two issues. Results from studies in subjects with IPF suggested that pamrevlumab slowed the progression of IPF as measured by change in ppFVC, quantitative analysis of fibrosis using HRCT, and time to  disease progression  or death. Xxxxxxxxxxxxxxxxxxxxxxxxxxxxxxxx  S: This visit for Subject Bryan Tyler with DOB: [birth year] on 09/03/2021 for the above protocol is Visit/Encounter # consent and screening  and is for purpose of research . Subject/LAR expressed continued interest and consent in continuing as a study subject.   Patient is patient of Dr. Vaughan Browner with IPF.  7 years ago he was on pirfenidone but had side effects.  Then he was on nintedanib.  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  Currently not on any antifibrotic's.  In this consent visit he clearly told me that he does not want to do any antifibrotic's anymore.  He does not want to rechallenge with them.  He is very interested in this particular protocol.  Formal consenting was done.  Questions answered.  All study procedures including exam was done only after signed informed consent.  OBjective  Physical exam done and and source documentation.  He does have some clubbing and basilar fibrosis crackles.   Assessment Research subject Idiopathic pulmonary fibrosis   Plan -Per protocol    SIGNATURE    Dr. Brand Males, M.D., F.C.C.P, ACRP-CPI Pulmonary and Critical Care Medicine Research Investigator, [facility] [place] Staff Physician, [place] Director - Interstitial Lung Disease  Program  Pulmonary [facility] [place], [place], [address]   Pager: [phone], If no answer  OR between  19:00-7:00h: page [phone] Telephone (research): [phone]  1:41 PM 09/03/2021   1:41 PM 09/03/2021

## 2021-09-03 NOTE — Patient Instructions (Signed)
ICD-10-CM   1. Research subject  Z00.6   2. IPF (idiopathic pulmonary fibrosis) (HCC)  J84.112     Per protocol 

## 2021-09-04 ENCOUNTER — Ambulatory Visit (INDEPENDENT_AMBULATORY_CARE_PROVIDER_SITE_OTHER): Payer: Medicare Other | Admitting: Pulmonary Disease

## 2021-09-04 ENCOUNTER — Encounter: Payer: Self-pay | Admitting: Pulmonary Disease

## 2021-09-04 VITALS — BP 134/76 | HR 101 | Temp 97.6°F | Ht 69.0 in | Wt 189.6 lb

## 2021-09-04 DIAGNOSIS — Z5181 Encounter for therapeutic drug level monitoring: Secondary | ICD-10-CM

## 2021-09-04 DIAGNOSIS — J84112 Idiopathic pulmonary fibrosis: Secondary | ICD-10-CM | POA: Diagnosis not present

## 2021-09-04 NOTE — Patient Instructions (Signed)
I am glad you are stable with your breathing and have enrolled in a clinical trial Continue exercise therapy Follow-up in 6 months

## 2021-09-04 NOTE — Progress Notes (Signed)
EBER FERRUFINO    [id number]    September 29, 1945  Primary Care Physician:Parker, Algis Greenhouse, MD  Referring Physician: Vivi Barrack, [address] [place],  [place] [address]  Chief complaint:  Follow-up for IPF Started Esbriet November 2021, stopped in January 2022 due to side effects Started Ofev in February 2022, dose reduced 100 mg due to diarrhea  HPI: 76 year old with history of pulmonary fibrosis, dyspnea, GERD, hypertension, hyperlipidemia Previously followed by Dr. Chase Caller.  Diagnosed with pulmonary fibrosis in 2019.  He was initially prescribed Ofev but denied by insurance Esbriet was then started.  He took this for 1 month and then stopped  the medication  He was reevaluated 2021 for worsening dyspnea with progressive pulmonary fibrosis on CT scan Started on antifibrotic's. Did not tolerate Esbriet due to side effects Started Ofev in February 2022  Referred for lung transplant at [facility] in 2022 but denied due to presence of thoracic artery aneurysm  Pets: Has a dog.  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  He has feather pillows which he has used for many years.  He got rid of it in November 2021 Smoking history: 20-pack-year smoker.  Quit in 1980 Travel history: Previously lived in [place].  No significant recent travel history Relevant family history: No family history of lung disease  Interim history: He was taken off Ofev in November 2022 as diarrhea was intolerable Followed up with [facility] and is starting on Fibrogen study Discussed started on Augmentin for sinusitis Continues exercise program at [facility] with improvement in exercise capacity.   Outpatient Encounter Medications as of 09/04/2021  Medication Sig   albuterol (VENTOLIN HFA) 108 (90 Base) MCG/ACT inhaler Inhale 2 puffs into the lungs every 6 (six) hours as needed for wheezing or shortness of breath.    amoxicillin-clavulanate (AUGMENTIN) 875-125 MG tablet Take 1 tablet by mouth 2 (two) times daily.   ARIPiprazole (ABILIFY) 2 MG tablet Take 1 tablet (2 mg total) by mouth daily.   atorvastatin (LIPITOR) 40 MG tablet Take 1 tablet (40 mg total) by mouth every evening.   azelastine (ASTELIN) 0.1 % nasal spray Place 2 sprays into both nostrils 2 (two) times daily.   benzonatate (TESSALON) 200 MG capsule Take 1 capsule (200 mg total) by mouth 2 (two) times daily as needed for cough.   clonazePAM (KLONOPIN) 1 MG tablet TAKE 1.5 TABLET AT BEDTIME AS NEEDED FOR ANXIETY   COVID-19 mRNA vaccine, Pfizer, 30 MCG/0.3ML injection    fluticasone (FLONASE) 50 MCG/ACT nasal spray Place 2 sprays into both nostrils daily.   gabapentin (NEURONTIN) 800 MG tablet Take 1 tablet (800 mg total) by mouth 2 (two) times daily.   ibuprofen (ADVIL) 200 MG tablet Take 400 mg by mouth as needed.   influenza vaccine adjuvanted (FLUAD QUADRIVALENT) 0.5 ML injection    levothyroxine (SYNTHROID) 112 MCG tablet TAKE 1 TABLET DAILY   Multiple Vitamin (MULTIVITAMIN WITH MINERALS) TABS tablet Take 1 tablet by mouth daily.   omeprazole (PRILOSEC) 40 MG capsule Take 1 capsule (40 mg total) by mouth in the morning and at bedtime.   oxymetazoline (AFRIN) 0.05 % nasal spray Place 2 sprays into both nostrils 3 (three) times daily as needed for congestion.    tamsulosin (FLOMAX) 0.4 MG CAPS capsule Take 1 capsule (0.4 mg total) by mouth daily. (Patient taking differently: Take 0.4 mg by mouth every evening.)   Tdap (  BOOSTRIX) 5-2.5-18.5 LF-MCG/0.5 injection    venlafaxine XR (EFFEXOR-XR) 150 MG 24 hr capsule Take 2 capsules (300 mg total) by mouth daily.   Zoster Vaccine Adjuvanted [facility]) injection Inject into the muscle.   No facility-administered encounter medications on file as of 09/04/2021.    Physical Exam: Blood pressure 134/76, pulse (!) 101, temperature 97.6 F (36.4 C), temperature source Oral, height 5\' 9"  (1.753 m),  weight 189 lb 9.6 oz (86 kg), SpO2 92 %. Gen:      No acute distress HEENT:  EOMI, sclera anicteric Neck:     No masses; no thyromegaly Lungs:    Bibasal crackles CV:         Regular rate and rhythm; no murmurs Abd:      + bowel sounds; soft, non-tender; no palpable masses, no distension Ext:    No edema; adequate peripheral perfusion Skin:      Warm and dry; no rash Neuro: alert and oriented x 3 Psych: normal mood and affect   Data Reviewed: Imaging: CT high-resolution 05/02/2020-UIP pattern pulmonary fibrosis progressive since 2013  CT high-resolution 07/02/2021-progressive UIP fibrosis I have reviewed the images personally.  PFTs: 05/27/2018 FVC 3.08 [77%], FEV1 2.81 [97%], F/F 91  normal spirometry, restriction possible  07/11/2020 FVC 2.42 [62%], FEV1 2.19 [77%], F/F 91, TLC 3.43 [51%] Severe restriction.  Unable to complete diffusion capacity.  04/11/2021 FVC 2.26 [58%], FEV1 2.05 [73%], F/F 91, TLC 3.64 [74%], DLCO 10.88 [46%] Severe restriction, diffusion defect  Labs: Hepatic panel 03/07/2021-within normal limits CBC 03/07/2021-normal  CMP 08/14/2020-within normal limits N-terminal proBNP 07/12/2020-27  Cardiac: Echocardiogram 10/02/2020- LVEF 55-60%, normal PA systolic pressure, estimated RVSP 30.2.  RV is mildly dilated.  Assessment:  IPF Likely has IPF given progressive UIP pattern Elevated CCP is likely nonsignificant.  He does have prior exposure to feather pillows but CT scan is not consistent with hypersensitivity pneumonitis.    He has not tolerated either Esbriet or Ofev due to side effects.  Last comprehensive metabolic panel on 08/17/7937 was normal Currently enrolled in clinical trials Echo reviewed with no significant pulmonary hypertension.  We will continue to monitor Continue exercise program at [facility] well.  We discussed pulmonary rehab but he would prefer to exercise at the gym  Chronic cough Likely secondary to pulmonary fibrosis and GERD He is on  Prilosec twice daily, and Tessalon Continue Neurontin to 800 mg twice daily.  He would like to avoid codeine cough syrup  Goals of care Discussed advanced well and he confirms DNR.  Not ready for palliative care yet  Plan/Recommendations: Exercise therapy Clinical trials  Marshell Garfinkel MD [facility] 09/04/2021, 4:11 PM  CC: Vivi Barrack, MD

## 2021-09-11 ENCOUNTER — Other Ambulatory Visit: Payer: Self-pay | Admitting: Internal Medicine

## 2021-09-11 DIAGNOSIS — J84112 Idiopathic pulmonary fibrosis: Secondary | ICD-10-CM

## 2021-09-11 DIAGNOSIS — Z006 Encounter for examination for normal comparison and control in clinical research program: Secondary | ICD-10-CM

## 2021-09-13 ENCOUNTER — Other Ambulatory Visit: Payer: Medicare Other

## 2021-09-16 ENCOUNTER — Other Ambulatory Visit: Payer: Self-pay

## 2021-09-16 ENCOUNTER — Ambulatory Visit (INDEPENDENT_AMBULATORY_CARE_PROVIDER_SITE_OTHER)
Admission: RE | Admit: 2021-09-16 | Discharge: 2021-09-16 | Disposition: A | Discharge status: Home / Self Care | Payer: Self-pay | Source: Ambulatory Visit | Attending: Internal Medicine | Admitting: Internal Medicine

## 2021-09-16 DIAGNOSIS — I2510 Atherosclerotic heart disease of native coronary artery without angina pectoris: Secondary | ICD-10-CM | POA: Diagnosis not present

## 2021-09-16 DIAGNOSIS — J84112 Idiopathic pulmonary fibrosis: Secondary | ICD-10-CM

## 2021-09-16 DIAGNOSIS — J849 Interstitial pulmonary disease, unspecified: Secondary | ICD-10-CM | POA: Diagnosis not present

## 2021-09-16 DIAGNOSIS — J479 Bronchiectasis, uncomplicated: Secondary | ICD-10-CM | POA: Diagnosis not present

## 2021-09-16 DIAGNOSIS — Z006 Encounter for examination for normal comparison and control in clinical research program: Secondary | ICD-10-CM

## 2021-09-16 DIAGNOSIS — I358 Other nonrheumatic aortic valve disorders: Secondary | ICD-10-CM | POA: Diagnosis not present

## 2021-09-20 ENCOUNTER — Other Ambulatory Visit: Payer: Self-pay | Admitting: Psychiatry

## 2021-09-20 DIAGNOSIS — F3342 Major depressive disorder, recurrent, in full remission: Secondary | ICD-10-CM

## 2021-09-20 NOTE — Progress Notes (Signed)
IPF progressive on CT. Let him know and also that research team is in touch with him

## 2021-09-25 ENCOUNTER — Encounter: Payer: Medicare Other | Admitting: Adult Health

## 2021-09-25 ENCOUNTER — Ambulatory Visit: Payer: Medicare Other

## 2021-09-26 ENCOUNTER — Encounter: Payer: Self-pay | Admitting: Pulmonary Disease

## 2021-09-26 NOTE — Telephone Encounter (Signed)
Dr. Vaughan Browner, please see pt's email regarding his cough and wanting something stronger than tessalon. Thank you!

## 2021-09-27 MED ORDER — PREDNISONE 10 MG PO TABS: 20.0000 mg | ORAL_TABLET | Freq: Every day | ORAL | 0 refills | Status: DC

## 2021-09-27 NOTE — Telephone Encounter (Signed)
I am sorry that your cough is not under control. We had discussed codeine cough syrup that you have an allergy to. We also discussed thalidomide which can help in some cases of cough with pulmonary fibrosis but decided not to go with it  You are already maximally treated with tessalon, anti acid medications and Neurontin Did you try OTC delsym, robitussin? I have sent in a prescription for low dose prednisone to your pharmacy as sometimes it may help.

## 2021-09-30 ENCOUNTER — Other Ambulatory Visit: Payer: Self-pay | Admitting: Psychiatry

## 2021-09-30 ENCOUNTER — Ambulatory Visit: Payer: Medicare Other | Admitting: Psychiatry

## 2021-09-30 DIAGNOSIS — F411 Generalized anxiety disorder: Secondary | ICD-10-CM

## 2021-09-30 DIAGNOSIS — F331 Major depressive disorder, recurrent, moderate: Secondary | ICD-10-CM

## 2021-10-01 ENCOUNTER — Encounter: Payer: Medicare Other | Admitting: Adult Health

## 2021-10-01 ENCOUNTER — Ambulatory Visit: Payer: Medicare Other

## 2021-10-02 ENCOUNTER — Ambulatory Visit (INDEPENDENT_AMBULATORY_CARE_PROVIDER_SITE_OTHER): Payer: Medicare Other | Admitting: Internal Medicine

## 2021-10-02 DIAGNOSIS — J84112 Idiopathic pulmonary fibrosis: Secondary | ICD-10-CM

## 2021-10-02 NOTE — Patient Instructions (Signed)
MDD discussion

## 2021-10-02 NOTE — Progress Notes (Signed)
Interstitial Lung Disease Multidisciplinary Conference   Bryan Tyler    MRN [id number]    DOB Jul 05, 1946  Primary Care Physician:Parker, Algis Greenhouse, MD  Referring Physician: Dr Marshell Garfinkel  Time of Conference: 7.30am- 8.30am Date of conference: 10/02/2021  Location of Conference: -  Virtual  Participating Pulmonary: Dr. Brand Males, MD - YES,  Dr Marshell Garfinkel, MD - YES Pathology: Dr Jaquita Folds, MD - NO Radiology: Dr Salvatore Marvel MD - NO, Dr Vinnie Langton MD - YES,  Dr Lorin Picket, MD - NO , Dr Eddie Candle - NO Others: Alethia Berthold - ILD nurse navigator, Lytle Michaels - research coordinator  Brief History:   -First seen in April 29, 2018 for ILD evaluation.  At that time he had insidious onset of dyspnea with exertion relieved by rest.  Associated with fatigue.  No family history of pulmonary fibrosis.  He did smoke cigarettes around 20 pack smoker and quit in 1990.  Or 1980.  He did use some feather pillows that he quit.  He also worked in [place] where there was a dusty environment but no mold exposure.  There is a history of previous gas leak in the house x1.  His initial serology work-up was negative except for trace positive CCP.  But over time he has never developed any form of arthritis.  Official CT read at that time was UIP.  IPF diagnosis was given based on male, age greater than 11 and CT scan findings.  Trace positive CCP was discounted because he did not have any arthritis and since then has not had any arthritis..  No air trapping or any findings of HP and a CT scan  He was started on standard of care antifibrotic's.  He initially tried pirfenidone but then quit taking the medication after 1 month because of side effects.  He was taken off nintedanib in November 2022 because of intolerable diarrhea.  He was evaluated for lung [facility] at [facility] in 2022 but denied due to presence of thoracic artery mild  aneurysm.   Serology: Trace positive CCP. No arthritis. Clinically no RA  Latest Reference Range & Units 04/29/18 10:27  Anti Nuclear Antibody (ANA) NEGATIVE  NEGATIVE  Angiotensin-Converting Enzyme 9 - 67 U/L 24  Cyclic Citrullin Peptide Ab UNITS 47 (H)  ds DNA Ab IU/mL <1  ENA RNP Ab 0.0 - 0.9 AI <0.2  Myeloperoxidase Abs AI <1.0  Serine Protease 3 AI <1.0  RA Latex Turbid. <14 IU/mL <14  (H): Data is abnormally high  MDD discussion of CT scan    - Date or time period of scan:  - 09/16/21 (research protocol HRCT), 07/02/21 (HRCT) , 05/02/20 (HRCT), 05/07/18 (HRCT),   - Features mentioned on Feb 2023 HRCT:  -   Zonal predominance - yes , definite cranio-caudal gradient  - subpleural findings - yes, bilateral , bibasal subpleural reticulation - Traction Bronchiectasis - YES - Reticulation - YES, total reticulation burden is 50-60% - Honeycombing - YES Most definitely -started possibly in 2019 and definitely seen in 2021 and 2022 HRCT - Air trapping - NO - Ground Glass Opacities - No - Emphysema - no - Miscellaneous Comments - - he has definite progressive ILD over time and is consistent with PFT decline. In 2019 - there was cranio-caudal gradient with Traction bronchietasis and possible honeycombing. In 2021 - all much worse with definite honeycombing in RML. In both no air trapping. In 2023 Feb - more progression.   -  What is the final conclusion per 2018 ATS/Fleischner Criteria - Classic Definitive UIP in Feb 2023 with Moderate-severe traction bronchiectasis in >= 2 lobes esp in mid and lower zone and Traction bronchiectasis in all  lobes. Total reticulation burden is 50-60%  Pathology discussion of biopsy -  NO PATH on PATIENT:  PFTs:  PFT Results Latest Ref Rng & Units 06/11/2021 07/11/2020 05/27/2018  FVC-Pre L 2.28 2.40 3.12  FVC-Predicted Pre % 59 61 78  FVC-Post L 2.26 2.42 3.08  FVC-Predicted Post % 58 62 77  Pre FEV1/FVC % % 89 89 90  Post FEV1/FCV % % 91 91 91   FEV1-Pre L 2.02 2.13 2.80  FEV1-Predicted Pre % 72 75 96  FEV1-Post L 2.05 2.19 2.81  DLCO uncorrected ml/min/mmHg 10.88 - -  DLCO UNC% % 46 - -  DLVA Predicted % 84 - -  TLC L 3.64 3.43 -  TLC % Predicted % 54 51 -  RV % Predicted % 87 44 -       MDD Impression/Recs: -Given Caucasian ethnicity, male gender, age greater than 1, previous smoking, no overt manifestation of connective tissue disease including rheumatoid arthritis [despite trace positive CCP] and progressive nature with symptoms, pulmonary function test decline and CT scan decline the diagnosis here is IPF [idiopathic pulmonary fibrosis].  He has been intolerant to standard of care antifibrotic's.  Next therapeutic option is a phase 3 clinical trial  Biopsy not recommended   Time Spent in preparation and discussion:  > 30 min    SIGNATURE   Dr. Brand Males, M.D., F.C.C.P,  Pulmonary and Critical Care Medicine Staff Physician, [place] Director - Interstitial Lung Disease  Program  Pulmonary [place] [facility] [place], [place], [address]  Pager: [phone], If no answer or between  15:00h - 7:00h: call [phone] Telephone: [phone]  9:00 AM 10/02/2021 ...................................................................................................................Marland Kitchen References: Diagnosis of Hypersensitivity Pneumonitis in Adults. An Official ATS/JRS/ALAT Clinical Practice Guideline. Ragu G et al, [place] Aug 1;202(3):e36-e69.       Diagnosis of Idiopathic Pulmonary Fibrosis. An Official ATS/ERS/JRS/ALAT Clinical Practice Guideline. Raghu G et al, [place]. 2018 Sep 1;198(5):e44-e68.   IPF Suspected   Histopath ology Pattern      UIP  Probable UIP  Indeterminate for  UIP  Alternative  diagnosis    UIP  IPF  IPF  IPF  Non-IPF dx   HRCT   Probabe UIP  IPF  IPF  IPF (Likely)**   Non-IPF dx  Pattern  Indeterminate for UIP  IPF  IPF (Likely)**  Indeterminate  for IPF**  Non-IPF dx    Alternative diagnosis  IPF (Likely)**/ non-IPF dx  Non-IPF dx  Non-IPF dx  Non-IPF dx     Idiopathic pulmonary fibrosis diagnosis based upon HRCT and Biopsy paterns.  ** IPF is the likely diagnosis when any of following features are present:  Moderate-to-severe traction bronchiectasis/bronchiolectasis (defined as mild traction bronchiectasis/bronchiolectasis in four or more lobes including the lingual as a lobe, or moderate to severe traction bronchiectasis in two or more lobes) in a man over age 27 years or in a woman over age 34 years Extensive (>30%) reticulation on HRCT and an age >70 years  Increased neutrophils and/or absence of lymphocytosis in BAL fluid  Multidisciplinary discussion reaches a confident diagnosis of IPF.   **Indeterminate for IPF  Without an adequate biopsy is  unlikely to be IPF  With an adequate biopsy may be reclassified to a more specific diagnosis after multidisciplinary discussion and/or additional consultation.   dx = diagnosis; HRCT = high-resolution computed tomography; IPF = idiopathic pulmonary fibrosis; UIP = usual interstitial pneumonia.

## 2021-10-04 ENCOUNTER — Encounter (INDEPENDENT_AMBULATORY_CARE_PROVIDER_SITE_OTHER): Payer: Medicare Other | Admitting: Internal Medicine

## 2021-10-04 ENCOUNTER — Encounter: Payer: Medicare Other | Admitting: *Deleted

## 2021-10-04 ENCOUNTER — Ambulatory Visit (INDEPENDENT_AMBULATORY_CARE_PROVIDER_SITE_OTHER): Payer: Medicare Other

## 2021-10-04 ENCOUNTER — Other Ambulatory Visit: Payer: Self-pay

## 2021-10-04 VITALS — BP 134/88 | HR 85 | Temp 97.5°F | Resp 28

## 2021-10-04 VITALS — BP 132/83 | HR 75 | Temp 97.3°F | Resp 28 | Wt 188.8 lb

## 2021-10-04 DIAGNOSIS — J84112 Idiopathic pulmonary fibrosis: Secondary | ICD-10-CM

## 2021-10-04 DIAGNOSIS — Z006 Encounter for examination for normal comparison and control in clinical research program: Secondary | ICD-10-CM

## 2021-10-04 MED ORDER — STUDY - ZEPHYRUS II - PAMREVLUMAB OR PLACEBO 10 MG/ML IV INFUSION (PI-RAMASWAMY)
30.0000 mg/kg | Freq: Once | INTRAVENOUS | Status: AC
  Administered 2021-10-04: 2570 mg via INTRAVENOUS
  Filled 2021-10-04: qty 257

## 2021-10-04 NOTE — Research (Signed)
Title: FGCL-3019-095 (FibroGen Study) is a Phase 3, randomized, double-blind, placebo-controlled multicenter international study to evaluate the efficacy and safety of 30 mg/kg IV infusions of pamrevlumab administered every 3 weeks for 52 weeks as compared to placebo in subjects with Idiopathic Pulmonary Fibrosis. Primary end point is: change in FVC from baseline at week 52. ZEPHYRUS II STUDY  Protocol #: FGCL-3019-095, Clinical Trials #: [id number] Sponsor: [email or web address]  ([place], [place], [place])  Protocol: Protocol Amendment 1.3 [id number] IB: version 19 dated 05NOV2021 Main ICF: version 06JUN2022 Optional Genetic Consent: version [id number] OLE ICF: version 14DEC21  Key Features of Pamrevlumab (FG-3019) the study drug: a recombinant fully human IgG kappa monoclonal antibody that binds to CTGF and is being developed for treatment of diseases in which tissue fibrosis has a major pathogenic role. In particular, pamrevlumab appears to disrupt a CTGF autocrine loop in mesenchymal cells like myofibroblasts that reduces their recruitment of leukocytes like macrophages, mast and dendritic cells via chemokine secretion. This disruption results in collapse of the cellular crosstalk that drives tissue remodeling.  Key Inclusion Criteria: Age 87 to 58 years Diagnosis of IPF within the past 7 years  Interstitial pulmonary fibrosis defined by HRCT scan at Screening, with evidence of =10% to <50% parenchymal fibrosis and <25% honeycombing, within the whole lung. Not currently receiving treatment for IPF with approved therapy. a. FVC value =45% and =95% of predicted at screening b. DLCO percent of predicted and corrected by Hb value =25% and =90% at screening The extent of fibrosis is greater than the extent of emphysema on HRCT. Male subjects with partners of childbearing potential and male subjects of childbearing potential (including those <1 year postmenopausal) must use double barrier contraception  methods during conduct of study, and for 3 months after last dose of study drug.   Key Exclusion Criteria: Previous exposure to pamrevlumab; Ongoing acute IPF exacerbation, or suspicion of such process, during Screening or Randomization; Interstitial lung disease other than IPF; Poorly controlled chronic heart failure; clinical diagnosis of cor pulmonale requiring specific treatment; or severe pulmonary arterial hypertension; Smoking within 3 months of Screening and/or unwilling to avoid smoking throughout the study; Use of any investigational drugs, for IPF or not, within the 30 days prior to screening initiation. Or use of approved IPF therapies within 1 week prior to screening; High likelihood of lung transplantation within 6 months after Day 1; Any history of malignancy likely to result in significant disability or mortality likely to require significant medical or surgical intervention within the next 2 years. This does not include minor surgical procedures for localized cancer; Daily use of PDE-5 inhibitor drugs [e.g. sildenafil, tadalafil, other]. (Note: Intermittent use of one type for erectile dysfunction or severe pulmonary hypertension is allowed).  Mechanism of Action: Pamrevlumab is a human recombinant IgG monoclonal antibody that binds to connective tissue growth factor (CTGF).  CTGF plays a role by mediating the process of fibrosis. By binding to CTGF, pamrevlumab blocks its biologic activity; thereby preventing cell proliferation, adhesion and migration of growth factors involved in fibrotic changes. It is also being studied in other conditions where fibrotic changes play a role; liver fibrosis, Duchenne muscular dystrophy and certain cancers.   Half-life: The effective terminal elimination half-life at the 30 mg/kg dose level was calculated to be  10.1 days after a single dose and 12.2 days after seven doses in patients with IPF (from Study FGCL-3019-049).   Interactions No  known drug interactions. All concomitant medications to be reviewed by PI.   Safety  Data: Investigators Brochure, Edition 19.0 1025 subjects have been exposed to pamrevlumab, 501 with IPF The most common TEAEs in all subjects with IPF: (269 IPF patients have been exposed = 12 weeks, 182 patients = 24 weeks and 78 patients = 52 weeks) Cough, fatigue, dyspnea, upper respiratory tract infection, bronchitis, nasopharyngitis No known effect on qtc prolongation, renal or hepatic issues A total of 29 patients discontinued therapy ( 9 due to disease progression, the other 20 due to  abdominal pain, gastrointestinal hemorrhage, pulmonary embolism, sepsis, renal  failure, and dyspnea.  Xxxxxxxxxxxxxxxxxxxxx [facility] meeting Aug 2021 - cleared to continue Xxxxxxxxxxxxxx Updates: - Sep2021: 1 anaphylactic reaction on 10th infusion on Duchenne patient in [place] - causally related - 2.04% incidence of PE (between IPF 6 patients, and 8 with pancreatic cancer of 685 in studye with 562 thought to be on IP) - possible cause by IP, but sponsor believes no changes to study program Bryan Tyler [place] [id number] for protocol to continue without changes   14-Jun-2021: As of this date 2 cases of Serious Adverse Event (SAE) anaphylactic reaction reported. The sponsor does not believe that changes to the conduct of this study are warranted at this time.  26Sep2022: Embryo fetal development study findings in male rabbits treated with pamrevlumab determined potential risks for pregnant male patients that could cause fetal abnormalities. Per sponsor, there are no recommended changes to safety monitoring, study procedures, or study conduct as a result of this finding. Per PI, no safety or data integrity has been compromised.    30-Aug-2020: Serious Adverse Event (SAE) described a case of toxic myocarditis. The sponsor does not believe that changes to the conduct of this study are warranted at this  time. xxxxxxxxxxxxxxxxxxxxxxxxxxxxxxxx   Clinical Trials in IPF: Phase 1 study Study FGCL-MC3019-002, n=21 Enrolled 21 subjects with IPF 3 deaths occurred due to disease progression. None of the deaths were considered related to study drug.  No dose-limiting toxicities All adverse events were considered mild to moderate 76% of subjects experienced at least 1 TEAE The most common TEAEs: Pyrexia (n=3, 14% of subjects) Cough (n=3, 14% of subjects) Dyspnea (n=3, 14% of subjects) Respiratory tract infection (n=2, 9% of subjects)   Phase 2 study Study FGCL-3019-049, n=90 Enrolled 90 subjects with IPF 14 deaths occurred, 13 were deemed to be related to IPF 20% of subjects experienced a TEAE that led to study drug discontinuation IPF and respiratory failure were the two most common reasons for discontinuation; occurring in 8% and 3% of patients, respectively The most common TEAEs: Cough (n=34, 38% of subjects) Dyspnea (n=24, 27% of subjects) Fatigue (n=24, 27% of subjects) Nasopharyngitis (n=20, 22% of subjects) Respiratory tract infection (n=19, 21% of subjects) Bronchitis (n=18, 20% of subjects)   Phase 2 study Study FGCL-3019-067, n=103 103 subjects enrolled with IPF 9 deaths occurred; 4 deemed related to IPF, 5 related to other respiratory causes 18% of subjects experienced a TEAE that led to study drug discontinuation The most common TEAEs: Cough (n=48, 47% of subjects) Respiratory tract infection (n=39, 38% of subjects) IPF (n=32, 31% of subjects) Dysnpea (n=30, 29% of subjects) Sinusitis (n=21, 21% of subjects) Fatigue (n=20, 19% of subjects)  Overall, pamrevlumab has been well tolerated. Infusion-related reactions have been reported at a rate that is consistent with other human monoclonal antibodies. Based on the mechanism of action of pamrevlumab, by inhibiting CTGF, there was some concern that this would cause impaired wound healing or impaired bone fracture healing.  However, there were no serious adverse  events reported in any study relating to these two issues. Results from studies in subjects with IPF suggested that pamrevlumab slowed the progression of IPF as measured by change in ppFVC, quantitative analysis of fibrosis using HRCT, and time to disease progression or death.    [facility] [place] Coordinator note :   This visit for Subject Bryan Tyler with DOB: [birth year] on 10/04/2021 for the above protocol is Visit/Encounter # Day1/Randomization and is for purpose of research.   The consent for this encounter is under Protocol: Protocol Amendment 1.3 [id number] IB: version 19 dated 05NOV2021 Main ICF: version 06JUN2022 Optional Genetic Consent: version [id number] OLE ICF: version 14DEC21  Subject expressed continued interest and consent in continuing as a study subject. Subject confirmed that there was  no change in contact information (e.g. address, telephone, email). Subject thanked for participation in research and contribution to science.  In this visit 10/04/2021 the subject will be evaluated by Principal Investigator named Dr. Brand Males. This research coordinator has verified that the above investigator is up to date with his training logs.   The Subject was informed that the PI Dr. Chase Caller continues to have oversight of the subject's visits and course through relevant discussions, reviews and also specifically of this visit by routing of this note to the [place].   This visit is a key visit of randomization. The PI is available for this visit. In addition, ahead of the key visit of randomization, the visit and subject were  discussed with the PI Dr. Chase Caller on date of 22Feb2023 face to face as part of direct PI oversight.  The patient appeared well. He reported no new complaints or changes since his screening visit on 24Jan2023. He received and tolerated the infusion well. Please refer to paper source subject binder for  further details.     Signed by  Lytle Michaels  Clinical Research Coordinator  [facility]  [place], [place] 5:04 PM 10/04/2021

## 2021-10-04 NOTE — Patient Instructions (Signed)
ICD-10-CM   1. Research subject  Z00.6   2. IPF (idiopathic pulmonary fibrosis) (HCC)  J84.112     Per protocol 

## 2021-10-04 NOTE — Progress Notes (Signed)
Diagnosis: Idiopathic Pulmonary Fibrosis (IPF)  Provider:  Marshell Garfinkel, MD  Procedure: Infusion  IV Type: Peripheral, IV Location: L Forearm  Zephyrus (pamrevlumab), Dose: 2570mg   Infusion Start Time: 1145  Infusion Stop Time: 2099  Post Infusion IV Care: Observation period completed and Peripheral IV Discontinued  Discharge: Condition: Good, Destination: Home .   Performed by:  Koren Shiver, RN

## 2021-10-04 NOTE — Progress Notes (Signed)
Title: FGCL-3019-095 (FibroGen Study) is a Phase 3, randomized, double-blind, placebo-controlled multicenter international study to evaluate the efficacy and safety of 30 mg/kg IV infusions of pamrevlumab administered every 3 weeks for 52 weeks as compared to placebo in subjects with Idiopathic Pulmonary Fibrosis. Primary end point is: change in FVC from baseline at week 52. ZEPHYRUS II STUDY  Protocol #: FGCL-3019-095, Clinical Trials #: [id number] Sponsor: [email or web address]  ([place], [place], [place])  Nature conservation officer Features of Pamrevlumab (FG-3019) the study drug: a recombinant fully human IgG kappa monoclonal antibody that binds to CTGF and is being developed for treatment of diseases in which tissue fibrosis has a major pathogenic role. In particular, pamrevlumab appears to disrupt a CTGF autocrine loop in mesenchymal cells like myofibroblasts that reduces their recruitment of leukocytes like macrophages, mast and dendritic cells via chemokine secretion. This disruption results in collapse of the cellular crosstalk that drives tissue remodeling.  Key Inclusion Criteria: Age 10 to 9 years Diagnosis of IPF within the past 7 years  Interstitial pulmonary fibrosis defined by HRCT scan at Screening, with evidence of =10% to <50% parenchymal fibrosis and <25% honeycombing, within the whole lung. Not currently receiving treatment for IPF with approved therapy. a. FVC value =45% and =95% of predicted at screening b. DLCO percent of predicted and corrected by Hb value =25% and =90% at screening The extent of fibrosis is greater than the extent of emphysema on HRCT. Male subjects with partners of childbearing potential and male subjects of childbearing potential (including those <1 year postmenopausal) must use double barrier contraception methods during conduct of study, and for 3 months after last dose of study drug.   Key Exclusion Criteria: Previous exposure to pamrevlumab; Ongoing acute IPF exacerbation,  or suspicion of such process, during Screening or Randomization; Interstitial lung disease other than IPF; Poorly controlled chronic heart failure; clinical diagnosis of cor pulmonale requiring specific treatment; or severe pulmonary arterial hypertension; Smoking within 3 months of Screening and/or unwilling to avoid smoking throughout the study; Use of any investigational drugs, for IPF or not, within the 30 days prior to screening initiation. Or use of approved IPF therapies within 1 week prior to screening; High likelihood of lung transplantation within 6 months after Day 1; Any history of malignancy likely to result in significant disability or mortality likely to require significant medical or surgical intervention within the next 2 years. This does not include minor surgical procedures for localized cancer; Daily use of PDE-5 inhibitor drugs [e.g. sildenafil, tadalafil, other]. (Note: Intermittent use of one type for erectile dysfunction or severe pulmonary hypertension is allowed).  Mechanism of Action: Pamrevlumab is a human recombinant IgG monoclonal antibody that binds to connective tissue growth factor (CTGF).  CTGF plays a role by mediating the process of fibrosis. By binding to CTGF, pamrevlumab blocks its biologic activity; thereby preventing cell proliferation, adhesion and migration of growth factors involved in fibrotic changes. It is also being studied in other conditions where fibrotic changes play a role; liver fibrosis, Duchenne muscular dystrophy and certain cancers.   Half-life: The effective terminal elimination half-life at the 30 mg/kg dose level was calculated to be  10.1 days after a single dose and 12.2 days after seven doses in patients with IPF (from Study FGCL-3019-049).   Interactions No known drug interactions. All concomitant medications to be reviewed by PI.   Safety Data: Investigators Brochure, Edition 19.0 1199 subjects have been exposed to pamrevlumab,  501 with IPF The most common TEAEs in all subjects with IPF: (  269 IPF patients have been exposed = 12 weeks, 182 patients = 24 weeks and 78 patients = 52 weeks) Cough, fatigue, dyspnea, upper respiratory tract infection, bronchitis, nasopharyngitis No known effect on qtc prolongation, renal or hepatic issues A total of 29 patients discontinued therapy ( 9 due to disease progression, the other 20 due to  abdominal pain, gastrointestinal hemorrhage, pulmonary embolism, sepsis, renal  failure, and dyspnea.  Xxxxxxxxxxxxxxxxxxxxx [facility] meeting Aug 2021 - cleared to continue Xxxxxxxxxxxxxx Updates: - Sep2021: 1 anaphylactic reaction on 10th infusion on Duchenne patient in [place] - causally related - 2.04% incidence of PE (between IPF 6 patients, and 8 with pancreatic cancer of 685 in studye with 562 thought to be on IP) - possible cause by IP, but sponsor believes no changes to study program [name] [id number] for protocol to continue without changes   14-Jun-2021: As of this date 2 cases of Serious Adverse Event (SAE) anaphylactic reaction reported. The sponsor does not believe that changes to the conduct of this study are warranted at this time.  26Sep2022: Embryo fetal development study findings in male rabbits treated with pamrevlumab determined potential risks for pregnant male patients that could cause fetal abnormalities. Per sponsor, there are no recommended changes to safety monitoring, study procedures, or study conduct as a result of this finding. Per PI, no safety or data integrity has been compromised.    30-Aug-2020: Serious Adverse Event (SAE) described a case of toxic myocarditis. The sponsor does not believe that changes to the conduct of this study are warranted at this time. xxxxxxxxxxxxxxxxxxxxxxxxxxxxxxxx   Clinical Trials in IPF: Phase 1 study Study FGCL-MC3019-002, n=21 Enrolled 21 subjects with IPF 3 deaths occurred due to disease progression. None of the  deaths were considered related to study drug.  No dose-limiting toxicities All adverse events were considered mild to moderate 76% of subjects experienced at least 1 TEAE The most common TEAEs: Pyrexia (n=3, 14% of subjects) Cough (n=3, 14% of subjects) Dyspnea (n=3, 14% of subjects) Respiratory tract infection (n=2, 9% of subjects)   Phase 2 study Study FGCL-3019-049, n=90 Enrolled 90 subjects with IPF 14 deaths occurred, 13 were deemed to be related to IPF 20% of subjects experienced a TEAE that led to study drug discontinuation IPF and respiratory failure were the two most common reasons for discontinuation; occurring in 8% and 3% of patients, respectively The most common TEAEs: Cough (n=34, 38% of subjects) Dyspnea (n=24, 27% of subjects) Fatigue (n=24, 27% of subjects) Nasopharyngitis (n=20, 22% of subjects) Respiratory tract infection (n=19, 21% of subjects) Bronchitis (n=18, 20% of subjects)   Phase 2 study Study FGCL-3019-067, n=103 103 subjects enrolled with IPF 9 deaths occurred; 4 deemed related to IPF, 5 related to other respiratory causes 18% of subjects experienced a TEAE that led to study drug discontinuation The most common TEAEs: Cough (n=48, 47% of subjects) Respiratory tract infection (n=39, 38% of subjects) IPF (n=32, 31% of subjects) Dysnpea (n=30, 29% of subjects) Sinusitis (n=21, 21% of subjects) Fatigue (n=20, 19% of subjects)  Overall, pamrevlumab has been well tolerated. Infusion-related reactions have been reported at a rate that is consistent with other human monoclonal antibodies. Based on the mechanism of action of pamrevlumab, by inhibiting CTGF, there was some concern that this would cause impaired wound healing or impaired bone fracture healing. However, there were no serious adverse events reported in any study relating to these two issues. Results from studies in subjects with IPF suggested that pamrevlumab slowed the progression of IPF  as  measured by change in ppFVC, quantitative analysis of fibrosis using HRCT, and time to disease progression or death.  Xxxxxxxxxxxxxxxxxxxx  This visit for Subject Bryan Tyler with DOB: Dec 03, 1945 on 10/04/2021 for the above protocol is Visit/Encounter # randomization  and is for purpose of research . Subject/LAR expressed continued interest and consent in continuing as a study subject. Subject thanked for participation in research and contribution to science.   S: In this randomization visit-no new complaints.  He is on prednisone short course.  This was started few to several days ago.  But otherwise feeling good.  We did an exam.  He also had spirometry.  He met criteria.  I subsequently proceeded to randomization I He denies any arthritis .  Objective -Clinical exam done.  Bibasal crackles present.  He does have skin nevus on the trunk.  Otherwise nonfocal exam.  No stigmata of connective tissue disease   Assessment -Research visit - Idiopathic pulmonary fibrosis   End of the day got an update from the research coordinator that patient had tolerated the infusion well this was after following all appropriate randomization procedures.  Per protocol  Plan -Proceed per protocol with randomization    SIGNATURE    Dr. Brand Males, M.D., F.C.C.P, ACRP-CPI Pulmonary and Critical Care Medicine Research Investigator, [facility] [place] Staff Physician, [place] Director - Interstitial Lung Disease  Program  [facility] [place], [place], [address]   Pager: [phone], If no answer  OR between  19:00-7:00h: page [phone] Telephone (research): [phone]  5:30 PM 10/04/2021   5:30 PM 10/04/2021

## 2021-10-08 ENCOUNTER — Encounter: Payer: Medicare Other | Admitting: Adult Health

## 2021-10-08 ENCOUNTER — Ambulatory Visit: Payer: Medicare Other

## 2021-10-16 DIAGNOSIS — C44319 Basal cell carcinoma of skin of other parts of face: Secondary | ICD-10-CM | POA: Diagnosis not present

## 2021-10-22 ENCOUNTER — Encounter: Payer: Self-pay | Admitting: Physician Assistant

## 2021-10-22 ENCOUNTER — Ambulatory Visit (INDEPENDENT_AMBULATORY_CARE_PROVIDER_SITE_OTHER): Payer: Medicare Other | Admitting: Physician Assistant

## 2021-10-22 VITALS — BP 158/96 | HR 99 | Temp 98.1°F | Ht 69.0 in | Wt 184.2 lb

## 2021-10-22 DIAGNOSIS — R058 Other specified cough: Secondary | ICD-10-CM

## 2021-10-22 DIAGNOSIS — J849 Interstitial pulmonary disease, unspecified: Secondary | ICD-10-CM

## 2021-10-22 MED ORDER — PREDNISONE 20 MG PO TABS: 20.0000 mg | ORAL_TABLET | Freq: Two times a day (BID) | ORAL | 0 refills | Status: AC

## 2021-10-22 MED ORDER — ALBUTEROL SULFATE HFA 108 (90 BASE) MCG/ACT IN AERS
2.0000 | INHALATION_SPRAY | Freq: Four times a day (QID) | RESPIRATORY_TRACT | 2 refills | Status: AC | PRN
Start: 2021-10-22 — End: ?

## 2021-10-22 NOTE — Progress Notes (Signed)
? ?Subjective:  ? ? Patient ID: Bryan Tyler, male    DOB: 08/09/1946, 76 y.o.   MRN: [id number] ? ?Chief Complaint  ?Patient presents with  ? Cough  ?  Congestion   ? ? ?Cough ? ?Patient with hx of pulmonary fibrosis (on 10 month experimental medication) is in today for worsening cough. ? ?Chief complaint: Clear sputum  ?Symptom onset: 10 days ago ?Pertinent positives: Cough, phlegm, mucous, runny nose, more SOB than normal ?Pertinent negatives: Orthopnea, fever, chills, body aches, chest pain ?Treatments tried: Mucinex-D, Albuterol inhaler - hasn't used much  ?Vaccine status: UTD on all vaccines ?Sick exposure: No recent sick contacts  ? ?Appt with Dr. Chase Caller, [place], this Friday.  ? ? ?Past Medical History:  ?Diagnosis Date  ? Adenocarcinoma of prostate ([place]) 04/16/2013  ? Aneurysm, aorta, thoracic   ? Anxiety   ? Arthritis   ? "knees" (05/21/2017)  ? Depression   ? Diverticulosis   ? Dyspnea   ? on exertion for a long period time  ? GERD (gastroesophageal reflux disease)   ? Hyperlipidemia   ? Hypertension   ? Hypothyroidism, postradioiodine therapy   ? 1980's  ? Nocturia   ? Organic impotence   ? OSA (obstructive sleep apnea)   ? NO CPAP--  S/P SURGERY  2002  ? Osteoporosis   ? Pneumonia 1970  ? PONV (postoperative nausea and vomiting)   ? Urge urinary incontinence   ? Wears contact lenses   ? ? ?Past Surgical History:  ?Procedure Laterality Date  ? HEMORRHOIDECTOMY WITH HEMORRHOID BANDING  2007  ? INGUINAL HERNIA REPAIR Bilateral   ? x4  (2 each side)  ? INSERTION PROSTATE RADIATION SEED    ? KNEE ARTHROSCOPY Right 2012  ? NASAL SEPTUM SURGERY  1982  ? w/ rhinoplasty  ? PROSTATE BIOPSY    ? RADIOACTIVE SEED IMPLANT N/A 05/30/2013  ? Procedure: RADIOACTIVE SEED IMPLANT;  Surgeon: Hanley Ben, MD;  Location: [facility];  Service: Urology;  Laterality: N/A;  ? REVERSE SHOULDER ARTHROPLASTY Right 05/21/2017  ? REVERSE SHOULDER ARTHROPLASTY Right 05/21/2017  ? REVERSE SHOULDER  ARTHROPLASTY Right 05/21/2017  ? Procedure: REVERSE RIGHT SHOULDER ARTHROPLASTY;  Surgeon: Justice Britain, MD;  Location: [facility];  Service: Orthopedics;  Laterality: Right;  ? RHINOPLASTY  1982  ? w/w/ septoplasty  ? SHOULDER ARTHROSCOPY W/ ROTATOR CUFF REPAIR Right 2004  ? SHOULDER ARTHROSCOPY W/ ROTATOR CUFF REPAIR Left 2016  ? TONSILLECTOMY  AS CHILD  ? TOTAL KNEE ARTHROPLASTY Right 09/24/2018  ? Procedure: TOTAL KNEE ARTHROPLASTY;  Surgeon: Sydnee Cabal, MD;  Location: [facility];  Service: Orthopedics;  Laterality: Right;  Adductor Block  ? UVULOPALATOPHARYNGOPLASTY  2002  ? ? ?Family History  ?Problem Relation Age of Onset  ? Cancer Mother   ?     Pancreatic Cancer  ? Heart disease Father 54  ?     CABG  ? ? ?Social History  ? ?Tobacco Use  ? Smoking status: Former  ?  Packs/day: 1.00  ?  Years: 10.00  ?  Pack years: 10.00  ?  Types: Cigarettes  ?  Quit date: 08/11/1978  ?  Years since quitting: 43.2  ? Smokeless tobacco: Never  ?Vaping Use  ? Vaping Use: Never used  ?Substance Use Topics  ? Alcohol use: Yes  ?  Comment: 05/21/2017 "1-2 beers/month"  ? Drug use: No  ?  ? ?Allergies  ?Allergen Reactions  ? Codeine Nausea And Vomiting and Other (See Comments)  ?  DIZZINESS  ? Ciprofloxacin Other (See Comments)  ?  Unknown  ? Quinolones   ?  Patient was warned about not using quinolones. ?Recent studies have raised concern that fluoroquinolone antibiotics could be associated with an increased risk of aortic aneurysm ?Fluoroquinolones have non-antimicrobial properties that might jeopardise the integrity of the extracellular matrix of the vascular wallthere was a 66% increased rate of aortic aneurysm or dissection associated with oral fluoroquinolone use, compared with amoxicillin use, within a 60 day risk period from start of treatment ? ? ?  ? ? ?Review of Systems  ?Respiratory:  Positive for cough.   ?NEGATIVE UNLESS OTHERWISE INDICATED IN HPI ? ? ?   ?Objective:  ?  ? ?BP (!) 158/96   Pulse 99   Temp 98.1 ?F (36.7  ?C)   Ht '5\' 9"'$  (1.753 m)   Wt 184 lb 4 oz (83.6 kg)   SpO2 95%   BMI 27.21 kg/m?  ? ?Wt Readings from Last 3 Encounters:  ?10/22/21 184 lb 4 oz (83.6 kg)  ?10/04/21 188 lb 12.8 oz (85.6 kg)  ?09/04/21 189 lb 9.6 oz (86 kg)  ? ? ?BP Readings from Last 3 Encounters:  ?10/25/21 138/87  ?10/25/21 131/78  ?10/22/21 (!) 158/96  ?  ? ?Physical Exam ?Vitals and nursing note reviewed.  ?Constitutional:   ?   General: He is not in acute distress. ?   Appearance: Normal appearance. He is not toxic-appearing.  ?HENT:  ?   Head: Normocephalic and atraumatic.  ?   Right Ear: Tympanic membrane, ear canal and external ear normal.  ?   Left Ear: Tympanic membrane, ear canal and external ear normal.  ?   Nose: Nose normal.  ?   Mouth/Throat:  ?   Mouth: Mucous membranes are moist.  ?   Pharynx: Oropharynx is clear.  ?Eyes:  ?   Extraocular Movements: Extraocular movements intact.  ?   Conjunctiva/sclera: Conjunctivae normal.  ?   Pupils: Pupils are equal, round, and reactive to light.  ?Cardiovascular:  ?   Rate and Rhythm: Normal rate and regular rhythm.  ?   Pulses: Normal pulses.  ?   Heart sounds: Normal heart sounds.  ?Pulmonary:  ?   Effort: Pulmonary effort is normal. Tachypnea present. No respiratory distress.  ?   Breath sounds: Decreased air movement present. Wheezing and rales present.  ?Musculoskeletal:     ?   General: Normal range of motion.  ?   Cervical back: Normal range of motion and neck supple.  ?Skin: ?   General: Skin is warm and dry.  ?Neurological:  ?   General: No focal deficit present.  ?   Mental Status: He is alert and oriented to person, place, and time.  ?Psychiatric:     ?   Mood and Affect: Mood normal.     ?   Behavior: Behavior normal.  ? ? ?   ?Assessment & Plan:  ? ?Problem List Items Addressed This Visit   ? ?  ? Respiratory  ? Interstitial lung disease ([place]) - Primary (Chronic)  ? ?Other Visit Diagnoses   ? ? Productive cough      ? ?  ? ? ? ?Meds ordered this encounter  ?Medications  ?  predniSONE (DELTASONE) 20 MG tablet  ?  Sig: Take 1 tablet (20 mg total) by mouth 2 (two) times daily with a meal for 5 days.  ?  Dispense:  10 tablet  ?  Refill:  0  ?  albuterol (VENTOLIN HFA) 108 (90 Base) MCG/ACT inhaler  ?  Sig: Inhale 2 puffs into the lungs every 6 (six) hours as needed for wheezing or shortness of breath.  ?  Dispense:  18 g  ?  Refill:  2  ? ?1. Interstitial lung disease ([place]) ?2. Productive cough ?Patient's PCP Dr. [name] participated in evaluation today. Not in acute resp distress. Will start on burst of prednisone and use rescue inhaler every 4-6 hours. Low threshold for ER if SOB, chest pain, or consistently low O2 below 90%. He will f/up with his pulmonologist this week.  ? ? ?Maliaka Brasington M Akyah Lagrange, PA-C ?

## 2021-10-23 ENCOUNTER — Other Ambulatory Visit: Payer: Self-pay

## 2021-10-23 ENCOUNTER — Telehealth: Payer: Self-pay | Admitting: Psychiatry

## 2021-10-23 DIAGNOSIS — F5105 Insomnia due to other mental disorder: Secondary | ICD-10-CM

## 2021-10-23 MED ORDER — CLONAZEPAM 1 MG PO TABS: ORAL_TABLET | ORAL | 0 refills | Status: DC

## 2021-10-23 NOTE — Telephone Encounter (Signed)
Pt called 9:45 requesting Rx to [facility] for Clonazepam. Apt 3/23 ?

## 2021-10-23 NOTE — Telephone Encounter (Signed)
Pended.

## 2021-10-24 ENCOUNTER — Other Ambulatory Visit: Payer: Self-pay

## 2021-10-24 DIAGNOSIS — F5105 Insomnia due to other mental disorder: Secondary | ICD-10-CM

## 2021-10-24 MED ORDER — CLONAZEPAM 1 MG PO TABS: ORAL_TABLET | ORAL | 0 refills | Status: DC

## 2021-10-24 NOTE — Telephone Encounter (Signed)
Rx pended

## 2021-10-24 NOTE — Telephone Encounter (Signed)
Bryan Tyler called this morning at 9:30am to report that [facility] does NOT have the clonazepam in stock.  He is requesting a 30day supply of this medication to be sent to [facility] in [place] to tie him over until [employer] can [employer]. Once they restock they will honor his prescription, but they told him to have you send in just a 30 day to his local pharmacy.  Again, please send to [facility] in [place]. ?

## 2021-10-25 ENCOUNTER — Encounter: Payer: Medicare Other | Admitting: *Deleted

## 2021-10-25 ENCOUNTER — Ambulatory Visit (INDEPENDENT_AMBULATORY_CARE_PROVIDER_SITE_OTHER): Payer: Medicare Other

## 2021-10-25 ENCOUNTER — Other Ambulatory Visit: Payer: Self-pay

## 2021-10-25 VITALS — BP 138/87 | HR 98 | Temp 97.4°F | Resp 26

## 2021-10-25 VITALS — BP 131/78 | HR 73 | Temp 97.4°F | Resp 28

## 2021-10-25 DIAGNOSIS — Z006 Encounter for examination for normal comparison and control in clinical research program: Secondary | ICD-10-CM

## 2021-10-25 DIAGNOSIS — J84112 Idiopathic pulmonary fibrosis: Secondary | ICD-10-CM

## 2021-10-25 MED ORDER — STUDY - ZEPHYRUS II - PAMREVLUMAB OR PLACEBO 10 MG/ML IV INFUSION (PI-RAMASWAMY)
30.0000 mg/kg | Freq: Once | INTRAVENOUS | Status: AC
  Administered 2021-10-25: 2570 mg via INTRAVENOUS
  Filled 2021-10-25: qty 257

## 2021-10-25 NOTE — Research (Signed)
Title: FGCL-3019-095 (FibroGen Study) is a Phase 3, randomized, double-blind, placebo-controlled multicenter international study to evaluate the efficacy and safety of 30 mg/kg IV infusions of pamrevlumab administered every 3 weeks for 52 weeks as compared to placebo in subjects with Idiopathic Pulmonary Fibrosis. Primary end point is: change in FVC from baseline at week 52. ZEPHYRUS II STUDY ? ?Protocol #: [id number]-3019-095, Clinical Trials #: [id number] Sponsor: [email or web address]  ?([place], [place], [place]) ? ?Protocol: Protocol Amendment 1.3 [id number] ?IB: version 20 2Nov2022 ?Main ICF: version [id number] ?Optional Genetic Consent: version 25Apr2022 ?OLE ICF: version 25Apr2022 ? ?Key Features of Pamrevlumab (FG-3019) the study drug: a recombinant fully human IgG kappa monoclonal antibody that binds to CTGF and is being developed for treatment of diseases in which tissue fibrosis has a major pathogenic role. In particular, pamrevlumab appears to disrupt a CTGF autocrine loop in mesenchymal cells like myofibroblasts that reduces their recruitment of leukocytes like macrophages, mast and dendritic cells via chemokine secretion. This disruption results in collapse of the cellular crosstalk that drives tissue remodeling. ? ?Key Inclusion Criteria: ?Age 50 to 66 years ?Diagnosis of IPF within the past 7 years  ?Interstitial pulmonary fibrosis defined by HRCT scan at Screening, with evidence of =10% to <50% parenchymal fibrosis and <25% honeycombing, within the whole lung. ?Not currently receiving treatment for IPF with approved therapy. ?a. FVC value =45% and =95% of predicted at screening ?b. DLCO percent of predicted and corrected by Hb value =25% and =90% at screening ?The extent of fibrosis is greater than the extent of emphysema on HRCT. ?Male subjects with partners of childbearing potential and male subjects of childbearing potential (including those <1 year postmenopausal) must use double barrier contraception  methods during conduct of study, and for 3 months after last dose of study drug. ? ? ?Key Exclusion Criteria: ?Previous exposure to pamrevlumab; ?Ongoing acute IPF exacerbation, or suspicion of such process, during Screening or Randomization; ?Interstitial lung disease other than IPF; ?Poorly controlled chronic heart failure; clinical diagnosis of cor pulmonale requiring specific treatment; or severe pulmonary arterial hypertension; ?Smoking within 3 months of Screening and/or unwilling to avoid smoking throughout the study; ?Use of any investigational drugs, for IPF or not, within the 30 days prior to screening initiation. Or use of approved IPF therapies within 1 week prior to screening; ?High likelihood of lung transplantation within 6 months after Day 1; ?Any history of malignancy likely to result in significant disability or mortality likely to require significant medical or surgical intervention within the next 2 years. This does not include minor surgical procedures for localized cancer; ?Daily use of PDE-5 inhibitor drugs [e.g. sildenafil, tadalafil, other]. (Note: Intermittent use of one type for erectile dysfunction or severe pulmonary hypertension is allowed). ? ?Mechanism of Action: ?Pamrevlumab is a human recombinant IgG monoclonal antibody that binds to connective tissue growth factor (CTGF).  CTGF plays a role by mediating the process of fibrosis. By binding to CTGF, pamrevlumab blocks its biologic activity; thereby preventing cell proliferation, adhesion and migration of growth factors involved in fibrotic changes. It is also being studied in other conditions where fibrotic changes play a role; liver fibrosis, Duchenne muscular dystrophy and certain cancers. ?  ?Half-life: ?The effective terminal elimination half-life at the 30 mg/kg dose level was calculated to be  10.1 days after a single dose and 12.2 days after seven doses in patients with IPF (from Study FGCL-3019-049). ?  ?Interactions ?No  known drug interactions. All concomitant medications to be reviewed by PI. ?  ?Safety Data: ?  Investigator?s Brochure, Edition 19.0 ?1199 subjects have been exposed to pamrevlumab, 501 with IPF ?The most common TEAEs in all subjects with IPF: ?(269 IPF patients have been exposed = 12 weeks, 182 patient?s = 24 weeks and 78 patients = 52 weeks) ?Cough, fatigue, dyspnea, upper respiratory tract infection, bronchitis, nasopharyngitis ?No known effect on qtc prolongation, renal or hepatic issues ?A total of 29 patients discontinued therapy ( 9 due to disease progression, the other 20 due to  abdominal pain, gastrointestinal hemorrhage, pulmonary embolism, sepsis, renal  failure, and dyspnea. ? ?Xxxxxxxxxxxxxxxxxxxxx ?[facility] meeting Aug 2021 - cleared to continue ?Xxxxxxxxxxxxxx ?Updates: ?- Sep2021: 1 anaphylactic reaction on 10th infusion on Duchenne patient in [place] - causally related ?- 2.04% incidence of PE (between IPF 6 patients, and 8 with pancreatic cancer of 685 in studye with 562 thought to be on IP) - possible cause by IP, but sponsor believes no changes to study program ?- Cleared [facility] Meeting [phone] for protocol to continue without changes ?  ?14-Jun-2021: As of this date 2 cases of Serious Adverse Event (SAE) anaphylactic reaction reported. The sponsor does not believe that changes to the conduct of this study are warranted at this time. ? ?26Sep2022: Embryo fetal development study findings in male rabbits treated with pamrevlumab determined potential risks for pregnant male patients that could cause fetal abnormalities. Per sponsor, there are no recommended changes to safety monitoring, study procedures, or study conduct as a result of this finding. Per PI, no safety or data integrity has been compromised.   ? ?30-Aug-2020: Serious Adverse Event (SAE) described a case of toxic myocarditis. The sponsor does not believe that changes to the conduct of this study are warranted at this  time. ?xxxxxxxxxxxxxxxxxxxxxxxxxxxxxxxx  ? ?Clinical Trials in IPF: ?Phase 1 study Study [id number], n=21 ?Enrolled 21 subjects with IPF ?3 deaths occurred due to disease progression. None of the deaths were considered related to study drug. ? ?No dose-limiting toxicities ?All adverse events were considered mild to moderate ?76% of subjects experienced at least 1 TEAE ?The most common TEAEs: ?Pyrexia (n=3, 14% of subjects) ?Cough (n=3, 14% of subjects) ?Dyspnea (n=3, 14% of subjects) ?Respiratory tract infection (n=2, 9% of subjects) ?  ?Phase 2 study Study FGCL-3019-049, n=90 ?Enrolled 90 subjects with IPF ?14 deaths occurred, 12 were deemed to be related to IPF ?20% of subjects experienced a TEAE that led to study drug discontinuation ?IPF and respiratory failure were the two most common reasons for discontinuation; occurring in 8% and 3% of patients, respectively ?The most common TEAEs: ?Cough (n=34, 38% of subjects) ?Dyspnea (n=24, 27% of subjects) ?Fatigue (n=24, 27% of subjects) ?Nasopharyngitis (n=20, 22% of subjects) ?Respiratory tract infection (n=19, 21% of subjects) ?Bronchitis (n=18, 20% of subjects) ?  ?Phase 2 study Study FGCL-[id number], n=103 ?103 subjects enrolled with IPF ?9 deaths occurred; 4 deemed related to IPF, 5 related to other respiratory causes ?18% of subjects experienced a TEAE that led to study drug discontinuation ?The most common TEAEs: ?Cough (n=48, 47% of subjects) ?Respiratory tract infection (n=39, 38% of subjects) ?IPF (n=32, 31% of subjects) ?Dysnpea (n=30, 29% of subjects) ?Sinusitis (n=21, 21% of subjects) ?Fatigue (n=20, 19% of subjects) ? ?Overall, pamrevlumab has been well tolerated. Infusion-related reactions have been reported at a rate that is consistent with other human monoclonal antibodies. Based on the mechanism of action of pamrevlumab, by inhibiting CTGF, there was some concern that this would cause impaired wound healing or impaired bone fracture healing.  However, there were no serious adverse events  reported in any study relating to these two issues. Results from studies in subjects with IPF suggested that pamrevlumab slowed the progression of IPF as measured by change in ppFVC, quantitativ

## 2021-10-25 NOTE — Progress Notes (Signed)
Diagnosis: Idiopathic Pulmonary Fibrosis (IPF) ? ?Provider:  Marshell Garfinkel, MD ? ?Procedure: Infusion ? ?IV Type: Peripheral, IV Location: R Antecubital ? ?Zephyrus , Dose: '2570mg'$  ? ?Infusion Start Time: 1791 ? ?Infusion Stop Time: 5056 ? ?Post Infusion IV Care: Observation period completed and Peripheral IV Discontinued ? ?Discharge: Condition: Good, Destination: Home . ? ?Performed by:  Koren Shiver, RN  ?  ?

## 2021-10-31 ENCOUNTER — Ambulatory Visit (INDEPENDENT_AMBULATORY_CARE_PROVIDER_SITE_OTHER): Payer: Medicare Other | Admitting: Psychiatry

## 2021-10-31 ENCOUNTER — Other Ambulatory Visit: Payer: Self-pay

## 2021-10-31 ENCOUNTER — Encounter: Payer: Self-pay | Admitting: Psychiatry

## 2021-10-31 DIAGNOSIS — F331 Major depressive disorder, recurrent, moderate: Secondary | ICD-10-CM

## 2021-10-31 DIAGNOSIS — F411 Generalized anxiety disorder: Secondary | ICD-10-CM | POA: Diagnosis not present

## 2021-10-31 DIAGNOSIS — F5105 Insomnia due to other mental disorder: Secondary | ICD-10-CM | POA: Diagnosis not present

## 2021-10-31 MED ORDER — CLONAZEPAM 1 MG PO TABS: ORAL_TABLET | ORAL | 1 refills | Status: AC

## 2021-10-31 NOTE — Progress Notes (Signed)
Bryan Tyler ?[id number] ?1946-02-01 ?76 y.o. ? ?Subjective:  ? ?Patient ID:  Bryan Tyler is a 76 y.o. (DOB [birth year]) male. ? ?Chief Complaint:  ?Chief Complaint  ?Patient presents with  ? Follow-up  ? Depression  ? Stress  ? Anxiety  ? ? ?HPI ?Bryan Tyler presents to the office today for follow-up of history of major depression and anxiety and insomnia. ? ?seen June 2020.  The only medication change was to reduce Abilify from 5 mg to 2 mg daily to see if tremor would improve.  He had residual anxiety but not much depression at the time. ? ?Patient called April 22, 2019 asking for refill of clonazepam early indicating he had increased the dosages on his own to 1.5 mg instead of 1 mg.  He was informed he could not change the dosage of a controlled substance without our permission. ? ?seen December 2020.  The following was noted: ?Tremor resolved with reduction in Abilify.  Wants to go through meds and their purpose. No history of RLS.   ?Ran out of Klonopin lately and only sleeping 3-4  Hours.  Usually sleeps oK with 1 mg nightly. ?Overall anxiety and depression and sleep are managed.  Because of polypharmacy we elected to try to discontinue buspirone and he was to let us know if he had any rebound anxiety. ? ?December 07, 2019 appointment the following is noted: ?More trouble with sleep and Klonopin 1 mg no longer getting him to sleep and will take another 1/2 tablet.  Then runs out of the Klonopin early. No SE from 1.5 mg Klonopin. ?Sleep 7 hours nightly.  45 nap in day.   ?2 cups coffee AM, not daily other caffeine but sometimes tea with dinner.   ?Work 3 days week. ?Plan: No med changes ? ?06/05/2020 appointment with the following noted: ?Rough physically, pneumonia July and then dx pulm fibrosis.  Feels not well.  No Covid.  Not smoker. ?Stayed on Effexor XR 300, Abilify 2 mg  And clonazepam 1.0-1.5 mg HS. ?Plan: He wonders about med change to try to help with the recurrent depression.  His  depression had been in remission on Abilify 5 mg and then 2 mg daily. ?The most logical alternative would be switching from Abilify to Rexulti 1 mg for 1 week and then if no response increase to 2 mg.  He had tremors on 5 mg of Abilify so going up and Abilify is not a good option. ? ?08/01/2020 appointment with the following noted: ?He called since being here indicating the [place] was making him too tired.  He was offered the option of reducing the dose or returning to Abilify. ?He thinks he returned to Abilify but not certain. ?Function OK but thinks he's still depressed.    ?Pulmonary fibrosis dx and RX Esbriet and it makes him feel out of it. ?Plan: Reduce Venlafaxine to 3 of the 75 mg capsules and add 30 mg duloxetine for 1 week, ?Then reduce venlafaxine to 2 of the 75 mg capsules and increase duloxetine to 2 capsules daily for 1 week, ?Then reduce venlafaxine to one of the 75 mg capsules and increase duloxetine to 3 capsules daily for 1 week, ?Then stop venlafaxine and continue 3 duloxetine daily ? ?09/17/2020 appointment urgently scheduled by the patient: ?Got confused and didn't make the change to duloxetine.  Didn't know if he was supposed to take both at the same time.  Had brain fog from new med for pulmonary fibrosis Esprit  and that compounded difficulty understand what to do and stopped that med too.    ?He doesn't want to take a change at this time.  Not necessary at this time. ?Plan: no med changes ? ?11/15/2020 appt with following noted: ?No SE with meds except for Pulm fibrosis. No SOB if not exerting. ?No SE with psych meds.   ?Stress cousin died with debt and he's going to be the Art therapist. ?Depression is pretty good and  manageable.  Anxiety is residual with stress.   Pt reports couldn't go to sleep before clonazepam and when ran out. Been able to sleep with 1 mg clonazepam.  Pt reports that appetite is good. Pt reports that energy is less good.  Concentration is better than last visit. Suicidal  thoughts:  denied by patient. ?Plan: No indication for med change ?Continue venlafaxine XR 300 mg daily ?Abillify 2 ?Clonazepam 1 HS ? ?03/20/2021 appointment with the following noted: ?Fairly well.  Still gets dep if idle but if busy he's OK.  Comes and goes. ?Anxiety is not a problem..  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 ?Sees the value of each meds.  Asks if there's a prn for depressed days which there isn't ?P;an: No indication for med change, except ok clonazepam 1.5 mg HS bc other failures ?Continue venlafaxine XR 300 mg daily ?Abillify 2  ? ?07/03/2021 appointment with the following noted: ?Questions about SE gabapentin he brought readout and questions about tremor, drowsiness, tiredness.   ?Doesn't think he's doing as well as he'd like with depression and anxiety.  Marital problems after 40 years of marriage and she's demanding.  Feels down when he comes home to her.   She gets angry with little things he does.  Get s me down daily. ?Enjoys PT sub teaching. ?Plan: no med changes ?No indication for med change ?Continue venlafaxine XR 300 mg daily ?Abillify 2 ?Continue clonazepam 1.5 mg HS per his request.  Take LED ? ?10/31/2021 appointment with the following noted: ?Last couple of mos rough bc dx idiopathic pulmonary fibrosis.  SOB for about a month with coughing and doesn't get better.  No meds RX so far.  I hope it's not the end of my life.   ?More anxiety worrying about it.  Still doing normal things but slower than before.   ?Anxiety more than depression lately. ?He's involved in study for pulmonary fibrosis ?No sig memory problems. ?No SE ?Sleep ok with clonazepam. ? ?Past Psychiatric Medication Trials: Effexor 300, ?Abilify 5 started 2017,  ?Rexulti 2 mg tired ? Buspirone 15 BID, ?Clonazepam 1 &1/2 mg nightly ?Gabapentin 800 nightly ?Belsomra ?Lorazepam ?Mirtazapine 30 ?Trazodone 150 ? ?Review of Systems:  ?Review of Systems  ?Constitutional:  Positive for fatigue.   ?Respiratory:  Positive for shortness of breath.   ?Cardiovascular:  Negative for palpitations.  ?     Enlarged aorta with follow-up next month.  ?Neurological:  Negative for dizziness, tremors, seizures and weakness. Neuro said no Parkinson's dz. ? ?Medications: I have reviewed the patient's current medications. ? ?Current Outpatient Medications  ?Medication Sig Dispense Refill  ? albuterol (VENTOLIN HFA) 108 (90 Base) MCG/ACT inhaler Inhale 2 puffs into the lungs every 6 (six) hours as needed for wheezing or shortness of breath. 18 g 2  ? ARIPiprazole (ABILIFY) 2 MG tablet TAKE 1 TABLET DAILY 90 tablet 0  ? atorvastatin (LIPITOR) 40 MG tablet Take 1 tablet (40 mg total) by mouth  every evening. 90 tablet 3  ? azelastine (ASTELIN) 0.1 % nasal spray Place 2 sprays into both nostrils 2 (two) times daily. 30 mL 12  ? clonazePAM (KLONOPIN) 1 MG tablet TAKE 1.5 TABLET AT BEDTIME AS NEEDED FOR ANXIETY 45 tablet 0  ? fluticasone (FLONASE) 50 MCG/ACT nasal spray Place 2 sprays into both nostrils daily. 16 g 6  ? gabapentin (NEURONTIN) 800 MG tablet Take 1 tablet (800 mg total) by mouth 2 (two) times daily. 180 tablet 1  ? ibuprofen (ADVIL) 200 MG tablet Take 400 mg by mouth as needed.    ? influenza vaccine adjuvanted (FLUAD QUADRIVALENT) 0.5 ML injection     ? levothyroxine (SYNTHROID) 112 MCG tablet TAKE 1 TABLET DAILY 90 tablet 3  ? Multiple Vitamin (MULTIVITAMIN WITH MINERALS) TABS tablet Take 1 tablet by mouth daily.    ? omeprazole (PRILOSEC) 40 MG capsule Take 1 capsule (40 mg total) by mouth in the morning and at bedtime. 90 capsule 3  ? oxymetazoline (AFRIN) 0.05 % nasal spray Place 2 sprays into both nostrils 3 (three) times daily as needed for congestion.     ? tamsulosin (FLOMAX) 0.4 MG CAPS capsule Take 1 capsule (0.4 mg total) by mouth daily. (Patient taking differently: Take 0.4 mg by mouth every evening.) 30 capsule 3  ? Tdap (BOOSTRIX) 5-2.5-18.5 LF-MCG/0.5 injection     ? venlafaxine XR (EFFEXOR-XR) 150  MG 24 hr capsule TAKE 2 CAPSULES DAILY 180 capsule 0  ? Zoster Vaccine Adjuvanted [facility]) injection Inject into the muscle. 0.5 mL 0  ? COVID-19 mRNA vaccine, Pfizer, 30 MCG/0.3ML injection  (Patient

## 2021-11-01 ENCOUNTER — Other Ambulatory Visit: Payer: Self-pay | Admitting: Family Medicine

## 2021-11-02 DIAGNOSIS — Z96611 Presence of right artificial shoulder joint: Secondary | ICD-10-CM | POA: Diagnosis present

## 2021-11-02 DIAGNOSIS — R0602 Shortness of breath: Secondary | ICD-10-CM | POA: Diagnosis not present

## 2021-11-02 DIAGNOSIS — I10 Essential (primary) hypertension: Secondary | ICD-10-CM | POA: Diagnosis present

## 2021-11-02 DIAGNOSIS — Z7189 Other specified counseling: Secondary | ICD-10-CM | POA: Diagnosis not present

## 2021-11-02 DIAGNOSIS — E46 Unspecified protein-calorie malnutrition: Secondary | ICD-10-CM | POA: Diagnosis not present

## 2021-11-02 DIAGNOSIS — J9621 Acute and chronic respiratory failure with hypoxia: Secondary | ICD-10-CM | POA: Diagnosis not present

## 2021-11-02 DIAGNOSIS — F03918 Unspecified dementia, unspecified severity, with other behavioral disturbance: Secondary | ICD-10-CM | POA: Diagnosis not present

## 2021-11-02 DIAGNOSIS — Z87891 Personal history of nicotine dependence: Secondary | ICD-10-CM | POA: Diagnosis not present

## 2021-11-02 DIAGNOSIS — K5909 Other constipation: Secondary | ICD-10-CM | POA: Diagnosis present

## 2021-11-02 DIAGNOSIS — J9601 Acute respiratory failure with hypoxia: Secondary | ICD-10-CM | POA: Diagnosis not present

## 2021-11-02 DIAGNOSIS — T380X5A Adverse effect of glucocorticoids and synthetic analogues, initial encounter: Secondary | ICD-10-CM | POA: Diagnosis not present

## 2021-11-02 DIAGNOSIS — F419 Anxiety disorder, unspecified: Secondary | ICD-10-CM | POA: Diagnosis present

## 2021-11-02 DIAGNOSIS — I088 Other rheumatic multiple valve diseases: Secondary | ICD-10-CM | POA: Diagnosis not present

## 2021-11-02 DIAGNOSIS — J129 Viral pneumonia, unspecified: Secondary | ICD-10-CM | POA: Diagnosis not present

## 2021-11-02 DIAGNOSIS — Z881 Allergy status to other antibiotic agents status: Secondary | ICD-10-CM | POA: Diagnosis not present

## 2021-11-02 DIAGNOSIS — R000 Tachycardia, unspecified: Secondary | ICD-10-CM | POA: Diagnosis not present

## 2021-11-02 DIAGNOSIS — B9789 Other viral agents as the cause of diseases classified elsewhere: Secondary | ICD-10-CM | POA: Diagnosis present

## 2021-11-02 DIAGNOSIS — E871 Hypo-osmolality and hyponatremia: Secondary | ICD-10-CM | POA: Diagnosis not present

## 2021-11-02 DIAGNOSIS — J9690 Respiratory failure, unspecified, unspecified whether with hypoxia or hypercapnia: Secondary | ICD-10-CM | POA: Diagnosis not present

## 2021-11-02 DIAGNOSIS — R0902 Hypoxemia: Secondary | ICD-10-CM | POA: Diagnosis not present

## 2021-11-02 DIAGNOSIS — J84112 Idiopathic pulmonary fibrosis: Secondary | ICD-10-CM | POA: Diagnosis not present

## 2021-11-02 DIAGNOSIS — R0600 Dyspnea, unspecified: Secondary | ICD-10-CM | POA: Diagnosis not present

## 2021-11-02 DIAGNOSIS — R0682 Tachypnea, not elsewhere classified: Secondary | ICD-10-CM | POA: Diagnosis not present

## 2021-11-02 DIAGNOSIS — I119 Hypertensive heart disease without heart failure: Secondary | ICD-10-CM | POA: Diagnosis not present

## 2021-11-02 DIAGNOSIS — I2699 Other pulmonary embolism without acute cor pulmonale: Secondary | ICD-10-CM | POA: Diagnosis not present

## 2021-11-02 DIAGNOSIS — J159 Unspecified bacterial pneumonia: Secondary | ICD-10-CM | POA: Diagnosis not present

## 2021-11-02 DIAGNOSIS — M810 Age-related osteoporosis without current pathological fracture: Secondary | ICD-10-CM | POA: Diagnosis present

## 2021-11-02 DIAGNOSIS — F32A Depression, unspecified: Secondary | ICD-10-CM | POA: Diagnosis present

## 2021-11-02 DIAGNOSIS — Z006 Encounter for examination for normal comparison and control in clinical research program: Secondary | ICD-10-CM | POA: Diagnosis not present

## 2021-11-02 DIAGNOSIS — Z79899 Other long term (current) drug therapy: Secondary | ICD-10-CM | POA: Diagnosis not present

## 2021-11-02 DIAGNOSIS — Z515 Encounter for palliative care: Secondary | ICD-10-CM | POA: Diagnosis not present

## 2021-11-02 DIAGNOSIS — F0393 Unspecified dementia, unspecified severity, with mood disturbance: Secondary | ICD-10-CM | POA: Diagnosis present

## 2021-11-02 DIAGNOSIS — Z885 Allergy status to narcotic agent status: Secondary | ICD-10-CM | POA: Diagnosis not present

## 2021-11-02 DIAGNOSIS — N400 Enlarged prostate without lower urinary tract symptoms: Secondary | ICD-10-CM | POA: Diagnosis present

## 2021-11-02 DIAGNOSIS — D72829 Elevated white blood cell count, unspecified: Secondary | ICD-10-CM | POA: Diagnosis not present

## 2021-11-02 DIAGNOSIS — R109 Unspecified abdominal pain: Secondary | ICD-10-CM | POA: Diagnosis not present

## 2021-11-02 DIAGNOSIS — Z8546 Personal history of malignant neoplasm of prostate: Secondary | ICD-10-CM | POA: Diagnosis not present

## 2021-11-02 DIAGNOSIS — E890 Postprocedural hypothyroidism: Secondary | ICD-10-CM | POA: Diagnosis present

## 2021-11-02 DIAGNOSIS — R739 Hyperglycemia, unspecified: Secondary | ICD-10-CM | POA: Diagnosis present

## 2021-11-02 DIAGNOSIS — G4733 Obstructive sleep apnea (adult) (pediatric): Secondary | ICD-10-CM | POA: Diagnosis present

## 2021-11-02 DIAGNOSIS — B348 Other viral infections of unspecified site: Secondary | ICD-10-CM | POA: Diagnosis not present

## 2021-11-02 DIAGNOSIS — Z96651 Presence of right artificial knee joint: Secondary | ICD-10-CM | POA: Diagnosis present

## 2021-11-02 DIAGNOSIS — F0394 Unspecified dementia, unspecified severity, with anxiety: Secondary | ICD-10-CM | POA: Diagnosis present

## 2021-11-02 DIAGNOSIS — J302 Other seasonal allergic rhinitis: Secondary | ICD-10-CM | POA: Diagnosis present

## 2021-11-02 DIAGNOSIS — J8410 Pulmonary fibrosis, unspecified: Secondary | ICD-10-CM | POA: Diagnosis not present

## 2021-11-02 DIAGNOSIS — K5900 Constipation, unspecified: Secondary | ICD-10-CM | POA: Diagnosis not present

## 2021-11-02 DIAGNOSIS — Y92239 Unspecified place in hospital as the place of occurrence of the external cause: Secondary | ICD-10-CM | POA: Diagnosis present

## 2021-11-02 DIAGNOSIS — Z20822 Contact with and (suspected) exposure to covid-19: Secondary | ICD-10-CM | POA: Diagnosis present

## 2021-11-02 DIAGNOSIS — I7121 Aneurysm of the ascending aorta, without rupture: Secondary | ICD-10-CM | POA: Diagnosis not present

## 2021-11-02 DIAGNOSIS — E785 Hyperlipidemia, unspecified: Secondary | ICD-10-CM | POA: Diagnosis present

## 2021-11-02 DIAGNOSIS — Z66 Do not resuscitate: Secondary | ICD-10-CM | POA: Diagnosis not present

## 2021-11-02 DIAGNOSIS — K219 Gastro-esophageal reflux disease without esophagitis: Secondary | ICD-10-CM | POA: Diagnosis present

## 2021-11-02 DIAGNOSIS — J679 Hypersensitivity pneumonitis due to unspecified organic dust: Secondary | ICD-10-CM | POA: Diagnosis not present

## 2021-11-03 DIAGNOSIS — D72829 Elevated white blood cell count, unspecified: Secondary | ICD-10-CM | POA: Diagnosis not present

## 2021-11-03 DIAGNOSIS — F0394 Unspecified dementia, unspecified severity, with anxiety: Secondary | ICD-10-CM | POA: Diagnosis present

## 2021-11-03 DIAGNOSIS — I7121 Aneurysm of the ascending aorta, without rupture: Secondary | ICD-10-CM | POA: Diagnosis present

## 2021-11-03 DIAGNOSIS — R109 Unspecified abdominal pain: Secondary | ICD-10-CM | POA: Diagnosis not present

## 2021-11-03 DIAGNOSIS — E785 Hyperlipidemia, unspecified: Secondary | ICD-10-CM | POA: Diagnosis present

## 2021-11-03 DIAGNOSIS — J9690 Respiratory failure, unspecified, unspecified whether with hypoxia or hypercapnia: Secondary | ICD-10-CM | POA: Diagnosis not present

## 2021-11-03 DIAGNOSIS — I119 Hypertensive heart disease without heart failure: Secondary | ICD-10-CM | POA: Diagnosis present

## 2021-11-03 DIAGNOSIS — Z96651 Presence of right artificial knee joint: Secondary | ICD-10-CM | POA: Diagnosis present

## 2021-11-03 DIAGNOSIS — E871 Hypo-osmolality and hyponatremia: Secondary | ICD-10-CM | POA: Diagnosis present

## 2021-11-03 DIAGNOSIS — Z515 Encounter for palliative care: Secondary | ICD-10-CM | POA: Diagnosis not present

## 2021-11-03 DIAGNOSIS — J9621 Acute and chronic respiratory failure with hypoxia: Secondary | ICD-10-CM | POA: Diagnosis present

## 2021-11-03 DIAGNOSIS — K219 Gastro-esophageal reflux disease without esophagitis: Secondary | ICD-10-CM | POA: Diagnosis present

## 2021-11-03 DIAGNOSIS — J159 Unspecified bacterial pneumonia: Secondary | ICD-10-CM | POA: Diagnosis present

## 2021-11-03 DIAGNOSIS — E46 Unspecified protein-calorie malnutrition: Secondary | ICD-10-CM | POA: Diagnosis present

## 2021-11-03 DIAGNOSIS — J84112 Idiopathic pulmonary fibrosis: Secondary | ICD-10-CM | POA: Diagnosis present

## 2021-11-03 DIAGNOSIS — R0600 Dyspnea, unspecified: Secondary | ICD-10-CM | POA: Diagnosis not present

## 2021-11-03 DIAGNOSIS — B9789 Other viral agents as the cause of diseases classified elsewhere: Secondary | ICD-10-CM | POA: Diagnosis present

## 2021-11-03 DIAGNOSIS — F419 Anxiety disorder, unspecified: Secondary | ICD-10-CM | POA: Diagnosis present

## 2021-11-03 DIAGNOSIS — K5900 Constipation, unspecified: Secondary | ICD-10-CM | POA: Diagnosis not present

## 2021-11-03 DIAGNOSIS — F0393 Unspecified dementia, unspecified severity, with mood disturbance: Secondary | ICD-10-CM | POA: Diagnosis present

## 2021-11-03 DIAGNOSIS — J9601 Acute respiratory failure with hypoxia: Secondary | ICD-10-CM | POA: Diagnosis present

## 2021-11-03 DIAGNOSIS — Y92239 Unspecified place in hospital as the place of occurrence of the external cause: Secondary | ICD-10-CM | POA: Diagnosis present

## 2021-11-03 DIAGNOSIS — T380X5A Adverse effect of glucocorticoids and synthetic analogues, initial encounter: Secondary | ICD-10-CM | POA: Diagnosis not present

## 2021-11-03 DIAGNOSIS — Z20822 Contact with and (suspected) exposure to covid-19: Secondary | ICD-10-CM | POA: Diagnosis present

## 2021-11-03 DIAGNOSIS — I088 Other rheumatic multiple valve diseases: Secondary | ICD-10-CM | POA: Diagnosis not present

## 2021-11-03 DIAGNOSIS — G4733 Obstructive sleep apnea (adult) (pediatric): Secondary | ICD-10-CM | POA: Diagnosis present

## 2021-11-03 DIAGNOSIS — Z8546 Personal history of malignant neoplasm of prostate: Secondary | ICD-10-CM | POA: Diagnosis not present

## 2021-11-03 DIAGNOSIS — N400 Enlarged prostate without lower urinary tract symptoms: Secondary | ICD-10-CM | POA: Diagnosis present

## 2021-11-03 DIAGNOSIS — Z66 Do not resuscitate: Secondary | ICD-10-CM | POA: Diagnosis present

## 2021-11-03 DIAGNOSIS — Z87891 Personal history of nicotine dependence: Secondary | ICD-10-CM | POA: Diagnosis not present

## 2021-11-03 DIAGNOSIS — Z881 Allergy status to other antibiotic agents status: Secondary | ICD-10-CM | POA: Diagnosis not present

## 2021-11-03 DIAGNOSIS — Z96611 Presence of right artificial shoulder joint: Secondary | ICD-10-CM | POA: Diagnosis present

## 2021-11-03 DIAGNOSIS — J8410 Pulmonary fibrosis, unspecified: Secondary | ICD-10-CM | POA: Diagnosis not present

## 2021-11-03 DIAGNOSIS — F32A Depression, unspecified: Secondary | ICD-10-CM | POA: Diagnosis present

## 2021-11-03 DIAGNOSIS — E890 Postprocedural hypothyroidism: Secondary | ICD-10-CM | POA: Diagnosis present

## 2021-11-03 DIAGNOSIS — R0602 Shortness of breath: Secondary | ICD-10-CM | POA: Diagnosis not present

## 2021-11-03 DIAGNOSIS — M810 Age-related osteoporosis without current pathological fracture: Secondary | ICD-10-CM | POA: Diagnosis present

## 2021-11-03 DIAGNOSIS — Z006 Encounter for examination for normal comparison and control in clinical research program: Secondary | ICD-10-CM | POA: Diagnosis not present

## 2021-11-03 DIAGNOSIS — R0902 Hypoxemia: Secondary | ICD-10-CM | POA: Diagnosis not present

## 2021-11-03 DIAGNOSIS — F03918 Unspecified dementia, unspecified severity, with other behavioral disturbance: Secondary | ICD-10-CM | POA: Diagnosis present

## 2021-11-03 DIAGNOSIS — K5909 Other constipation: Secondary | ICD-10-CM | POA: Diagnosis present

## 2021-11-03 DIAGNOSIS — J129 Viral pneumonia, unspecified: Secondary | ICD-10-CM | POA: Diagnosis present

## 2021-11-03 DIAGNOSIS — R739 Hyperglycemia, unspecified: Secondary | ICD-10-CM | POA: Diagnosis present

## 2021-11-03 DIAGNOSIS — R000 Tachycardia, unspecified: Secondary | ICD-10-CM | POA: Diagnosis not present

## 2021-11-03 DIAGNOSIS — I10 Essential (primary) hypertension: Secondary | ICD-10-CM | POA: Diagnosis present

## 2021-11-03 DIAGNOSIS — R0682 Tachypnea, not elsewhere classified: Secondary | ICD-10-CM | POA: Diagnosis not present

## 2021-11-03 DIAGNOSIS — Z7189 Other specified counseling: Secondary | ICD-10-CM | POA: Diagnosis not present

## 2021-11-03 DIAGNOSIS — B348 Other viral infections of unspecified site: Secondary | ICD-10-CM | POA: Diagnosis not present

## 2021-11-03 DIAGNOSIS — J302 Other seasonal allergic rhinitis: Secondary | ICD-10-CM | POA: Diagnosis present

## 2021-11-03 DIAGNOSIS — Z885 Allergy status to narcotic agent status: Secondary | ICD-10-CM | POA: Diagnosis not present

## 2021-11-03 DIAGNOSIS — Z79899 Other long term (current) drug therapy: Secondary | ICD-10-CM | POA: Diagnosis not present

## 2021-11-04 ENCOUNTER — Telehealth: Payer: Self-pay | Admitting: Internal Medicine

## 2021-11-04 NOTE — Telephone Encounter (Signed)
[address] ?[place] [id number] - ? 06-Nov-1945 - admitted 11/02/21 wtiu IPF flare up. In [place].  Now on 10L per Dr Jola Schmidt the hospitalist. Moved from floor to ICU due to increasing o2 needs. Admitted with 4L and day by day getting worse. RVP pending. Covid negative. Flu negative. INformed -> unrelated to study drug . Explained that Mortality rate is very high - IPF flare up. HE asked me to talk to Dr Posey Pronto the pulmonologist ->  I will do that. Also recommended DNR for diagnosis of IPF flare up ? ? ?Also, recommended if stable to tx patient to [place] health ([place] or [place]) so he can be near his home ? ? ? ? ?SIGNATURE  ? ? ?Dr. Brand Males, M.D., F.C.C.P,  ?Pulmonary and Critical Care Medicine ?Staff Physician, [place] ?Center Director - Interstitial Lung Disease  Program  ?Pulmonary [place] [facility] ?[place], [place], [address] ? ?NPI Number:  NPI #[id number] ?DEA Number: [id number] ? ?Pager: [phone], If no answer  -> Check AMION or Try [phone] ?Telephone (clinical office): [phone] ?Telephone (research): [phone] ? ?2:14 PM ?11/04/2021 ? ?

## 2021-11-05 ENCOUNTER — Inpatient Hospital Stay (HOSPITAL_COMMUNITY)
Admission: AD | Admit: 2021-11-05 | Discharge: 2021-12-09 | DRG: 196 | Disposition: E | Payer: Medicare Other | Source: Other Acute Inpatient Hospital | Attending: Critical Care Medicine | Admitting: Critical Care Medicine

## 2021-11-05 ENCOUNTER — Inpatient Hospital Stay (HOSPITAL_COMMUNITY): Payer: Medicare Other

## 2021-11-05 DIAGNOSIS — G4733 Obstructive sleep apnea (adult) (pediatric): Secondary | ICD-10-CM | POA: Diagnosis present

## 2021-11-05 DIAGNOSIS — Y92239 Unspecified place in hospital as the place of occurrence of the external cause: Secondary | ICD-10-CM | POA: Diagnosis present

## 2021-11-05 DIAGNOSIS — R918 Other nonspecific abnormal finding of lung field: Secondary | ICD-10-CM | POA: Diagnosis not present

## 2021-11-05 DIAGNOSIS — J9621 Acute and chronic respiratory failure with hypoxia: Secondary | ICD-10-CM | POA: Diagnosis present

## 2021-11-05 DIAGNOSIS — E875 Hyperkalemia: Secondary | ICD-10-CM | POA: Diagnosis not present

## 2021-11-05 DIAGNOSIS — I119 Hypertensive heart disease without heart failure: Secondary | ICD-10-CM | POA: Diagnosis present

## 2021-11-05 DIAGNOSIS — Z885 Allergy status to narcotic agent status: Secondary | ICD-10-CM

## 2021-11-05 DIAGNOSIS — J9690 Respiratory failure, unspecified, unspecified whether with hypoxia or hypercapnia: Secondary | ICD-10-CM | POA: Diagnosis not present

## 2021-11-05 DIAGNOSIS — Z66 Do not resuscitate: Secondary | ICD-10-CM | POA: Diagnosis present

## 2021-11-05 DIAGNOSIS — F0394 Unspecified dementia, unspecified severity, with anxiety: Secondary | ICD-10-CM | POA: Diagnosis present

## 2021-11-05 DIAGNOSIS — Z006 Encounter for examination for normal comparison and control in clinical research program: Secondary | ICD-10-CM | POA: Diagnosis not present

## 2021-11-05 DIAGNOSIS — B9789 Other viral agents as the cause of diseases classified elsewhere: Secondary | ICD-10-CM | POA: Diagnosis present

## 2021-11-05 DIAGNOSIS — F0393 Unspecified dementia, unspecified severity, with mood disturbance: Secondary | ICD-10-CM | POA: Diagnosis present

## 2021-11-05 DIAGNOSIS — J159 Unspecified bacterial pneumonia: Secondary | ICD-10-CM | POA: Diagnosis present

## 2021-11-05 DIAGNOSIS — Z79890 Hormone replacement therapy (postmenopausal): Secondary | ICD-10-CM

## 2021-11-05 DIAGNOSIS — J129 Viral pneumonia, unspecified: Secondary | ICD-10-CM | POA: Diagnosis present

## 2021-11-05 DIAGNOSIS — Z452 Encounter for adjustment and management of vascular access device: Secondary | ICD-10-CM

## 2021-11-05 DIAGNOSIS — Z515 Encounter for palliative care: Secondary | ICD-10-CM | POA: Diagnosis not present

## 2021-11-05 DIAGNOSIS — J84112 Idiopathic pulmonary fibrosis: Principal | ICD-10-CM | POA: Diagnosis present

## 2021-11-05 DIAGNOSIS — I7121 Aneurysm of the ascending aorta, without rupture: Secondary | ICD-10-CM | POA: Diagnosis present

## 2021-11-05 DIAGNOSIS — M810 Age-related osteoporosis without current pathological fracture: Secondary | ICD-10-CM | POA: Diagnosis present

## 2021-11-05 DIAGNOSIS — Z79899 Other long term (current) drug therapy: Secondary | ICD-10-CM

## 2021-11-05 DIAGNOSIS — K219 Gastro-esophageal reflux disease without esophagitis: Secondary | ICD-10-CM | POA: Diagnosis present

## 2021-11-05 DIAGNOSIS — F32A Depression, unspecified: Secondary | ICD-10-CM | POA: Diagnosis present

## 2021-11-05 DIAGNOSIS — E871 Hypo-osmolality and hyponatremia: Secondary | ICD-10-CM | POA: Diagnosis not present

## 2021-11-05 DIAGNOSIS — Z8546 Personal history of malignant neoplasm of prostate: Secondary | ICD-10-CM | POA: Diagnosis not present

## 2021-11-05 DIAGNOSIS — R739 Hyperglycemia, unspecified: Secondary | ICD-10-CM | POA: Diagnosis present

## 2021-11-05 DIAGNOSIS — E785 Hyperlipidemia, unspecified: Secondary | ICD-10-CM | POA: Diagnosis present

## 2021-11-05 DIAGNOSIS — Z96651 Presence of right artificial knee joint: Secondary | ICD-10-CM | POA: Diagnosis present

## 2021-11-05 DIAGNOSIS — N400 Enlarged prostate without lower urinary tract symptoms: Secondary | ICD-10-CM | POA: Diagnosis present

## 2021-11-05 DIAGNOSIS — Z7189 Other specified counseling: Secondary | ICD-10-CM | POA: Diagnosis not present

## 2021-11-05 DIAGNOSIS — Z800 Family history of malignant neoplasm of digestive organs: Secondary | ICD-10-CM

## 2021-11-05 DIAGNOSIS — I10 Essential (primary) hypertension: Secondary | ICD-10-CM | POA: Diagnosis not present

## 2021-11-05 DIAGNOSIS — E46 Unspecified protein-calorie malnutrition: Secondary | ICD-10-CM | POA: Diagnosis present

## 2021-11-05 DIAGNOSIS — J479 Bronchiectasis, uncomplicated: Secondary | ICD-10-CM | POA: Diagnosis not present

## 2021-11-05 DIAGNOSIS — F03918 Unspecified dementia, unspecified severity, with other behavioral disturbance: Secondary | ICD-10-CM | POA: Diagnosis present

## 2021-11-05 DIAGNOSIS — Z96611 Presence of right artificial shoulder joint: Secondary | ICD-10-CM | POA: Diagnosis present

## 2021-11-05 DIAGNOSIS — R053 Chronic cough: Secondary | ICD-10-CM | POA: Diagnosis present

## 2021-11-05 DIAGNOSIS — J8410 Pulmonary fibrosis, unspecified: Secondary | ICD-10-CM | POA: Diagnosis not present

## 2021-11-05 DIAGNOSIS — E890 Postprocedural hypothyroidism: Secondary | ICD-10-CM | POA: Diagnosis present

## 2021-11-05 DIAGNOSIS — R519 Headache, unspecified: Secondary | ICD-10-CM | POA: Diagnosis not present

## 2021-11-05 DIAGNOSIS — J9601 Acute respiratory failure with hypoxia: Secondary | ICD-10-CM | POA: Diagnosis not present

## 2021-11-05 DIAGNOSIS — I517 Cardiomegaly: Secondary | ICD-10-CM | POA: Diagnosis not present

## 2021-11-05 DIAGNOSIS — Z8249 Family history of ischemic heart disease and other diseases of the circulatory system: Secondary | ICD-10-CM

## 2021-11-05 DIAGNOSIS — Z87891 Personal history of nicotine dependence: Secondary | ICD-10-CM

## 2021-11-05 DIAGNOSIS — Z881 Allergy status to other antibiotic agents status: Secondary | ICD-10-CM

## 2021-11-05 DIAGNOSIS — Z888 Allergy status to other drugs, medicaments and biological substances status: Secondary | ICD-10-CM

## 2021-11-05 DIAGNOSIS — G4700 Insomnia, unspecified: Secondary | ICD-10-CM | POA: Diagnosis present

## 2021-11-05 DIAGNOSIS — J80 Acute respiratory distress syndrome: Secondary | ICD-10-CM | POA: Diagnosis not present

## 2021-11-05 DIAGNOSIS — Z6826 Body mass index (BMI) 26.0-26.9, adult: Secondary | ICD-10-CM

## 2021-11-05 DIAGNOSIS — T380X5A Adverse effect of glucocorticoids and synthetic analogues, initial encounter: Secondary | ICD-10-CM | POA: Diagnosis present

## 2021-11-05 DIAGNOSIS — M6250 Muscle wasting and atrophy, not elsewhere classified, unspecified site: Secondary | ICD-10-CM | POA: Diagnosis present

## 2021-11-05 DIAGNOSIS — B348 Other viral infections of unspecified site: Secondary | ICD-10-CM | POA: Diagnosis not present

## 2021-11-05 DIAGNOSIS — R0602 Shortness of breath: Secondary | ICD-10-CM | POA: Diagnosis not present

## 2021-11-05 DIAGNOSIS — D72829 Elevated white blood cell count, unspecified: Secondary | ICD-10-CM | POA: Diagnosis present

## 2021-11-05 LAB — CBC
HCT: 40.4 % (ref 39.0–52.0)
Hemoglobin: 12.5 g/dL — ABNORMAL LOW (ref 13.0–17.0)
MCH: 27.3 pg (ref 26.0–34.0)
MCHC: 30.9 g/dL (ref 30.0–36.0)
MCV: 88.2 fL (ref 80.0–100.0)
Platelets: 379 10*3/uL (ref 150–400)
RBC: 4.58 MIL/uL (ref 4.22–5.81)
RDW: 15.7 % — ABNORMAL HIGH (ref 11.5–15.5)
WBC: 21.9 10*3/uL — ABNORMAL HIGH (ref 4.0–10.5)
nRBC: 0 % (ref 0.0–0.2)

## 2021-11-05 LAB — COMPREHENSIVE METABOLIC PANEL
ALT: 32 U/L (ref 0–44)
AST: 37 U/L (ref 15–41)
Albumin: 3.6 g/dL (ref 3.5–5.0)
Alkaline Phosphatase: 83 U/L (ref 38–126)
Anion gap: 9 (ref 5–15)
BUN: 25 mg/dL — ABNORMAL HIGH (ref 8–23)
CO2: 26 mmol/L (ref 22–32)
Calcium: 8.7 mg/dL — ABNORMAL LOW (ref 8.9–10.3)
Chloride: 104 mmol/L (ref 98–111)
Creatinine, Ser: 0.78 mg/dL (ref 0.61–1.24)
GFR, Estimated: >60 mL/min (ref >60)
Glucose, Bld: 172 mg/dL — ABNORMAL HIGH (ref 70–99)
Potassium: 4.3 mmol/L (ref 3.5–5.1)
Sodium: 139 mmol/L (ref 135–145)
Total Bilirubin: 0.6 mg/dL (ref 0.3–1.2)
Total Protein: 7.4 g/dL (ref 6.5–8.1)

## 2021-11-05 LAB — PHOSPHORUS: Phosphorus: 3.8 mg/dL (ref 2.5–4.6)

## 2021-11-05 LAB — MRSA NEXT GEN BY PCR, NASAL: MRSA by PCR Next Gen: NOT DETECTED

## 2021-11-05 LAB — HEMOGLOBIN A1C
Hgb A1c MFr Bld: 6.4 % — ABNORMAL HIGH (ref 4.8–5.6)
Mean Plasma Glucose: 136.98 mg/dL

## 2021-11-05 LAB — GLUCOSE, CAPILLARY: Glucose-Capillary: 107 mg/dL — ABNORMAL HIGH (ref 70–99)

## 2021-11-05 LAB — BRAIN NATRIURETIC PEPTIDE: B Natriuretic Peptide: 124.1 pg/mL — ABNORMAL HIGH (ref 0.0–100.0)

## 2021-11-05 LAB — MAGNESIUM: Magnesium: 2.4 mg/dL (ref 1.7–2.4)

## 2021-11-05 MED ORDER — CHLORHEXIDINE GLUCONATE CLOTH 2 % EX PADS
6.0000 | MEDICATED_PAD | Freq: Every day | CUTANEOUS | Status: DC
  Administered 2021-11-05: 6 via TOPICAL

## 2021-11-05 MED ORDER — CLONAZEPAM 1 MG PO TABS
1.5000 mg | ORAL_TABLET | Freq: Every evening | ORAL | Status: DC | PRN
  Administered 2021-11-05: 1 mg via ORAL
  Filled 2021-11-05: qty 1

## 2021-11-05 MED ORDER — METHYLPREDNISOLONE SODIUM SUCC 125 MG IJ SOLR
80.0000 mg | Freq: Two times a day (BID) | INTRAMUSCULAR | Status: DC
  Administered 2021-11-05 – 2021-11-20 (×31): 80 mg via INTRAVENOUS
  Filled 2021-11-05 (×32): qty 2

## 2021-11-05 MED ORDER — ORAL CARE MOUTH RINSE: 15.0000 mL | Freq: Two times a day (BID) | OROMUCOSAL | Status: DC

## 2021-11-05 MED ORDER — ALBUTEROL SULFATE (2.5 MG/3ML) 0.083% IN NEBU: 2.5000 mg | INHALATION_SOLUTION | RESPIRATORY_TRACT | Status: DC | PRN

## 2021-11-05 MED ORDER — ADULT MULTIVITAMIN W/MINERALS CH
1.0000 | ORAL_TABLET | Freq: Every day | ORAL | Status: DC
  Administered 2021-11-06 – 2021-11-20 (×15): 1 via ORAL
  Filled 2021-11-05 (×15): qty 1

## 2021-11-05 MED ORDER — ONDANSETRON HCL 4 MG/2ML IJ SOLN: 4.0000 mg | Freq: Four times a day (QID) | INTRAMUSCULAR | Status: DC | PRN

## 2021-11-05 MED ORDER — CLONAZEPAM 0.5 MG PO TABS
0.5000 mg | ORAL_TABLET | Freq: Two times a day (BID) | ORAL | Status: DC
  Administered 2021-11-05: 0.5 mg via ORAL
  Filled 2021-11-05: qty 1

## 2021-11-05 MED ORDER — OXYMETAZOLINE HCL 0.05 % NA SOLN
2.0000 | Freq: Three times a day (TID) | NASAL | Status: AC | PRN
  Filled 2021-11-05: qty 15

## 2021-11-05 MED ORDER — POLYETHYLENE GLYCOL 3350 17 G PO PACK
17.0000 g | PACK | Freq: Every day | ORAL | Status: DC | PRN
  Administered 2021-11-20: 17 g via ORAL
  Filled 2021-11-05: qty 1

## 2021-11-05 MED ORDER — DOCUSATE SODIUM 100 MG PO CAPS
100.0000 mg | ORAL_CAPSULE | Freq: Two times a day (BID) | ORAL | Status: DC | PRN
  Administered 2021-11-11 – 2021-11-20 (×4): 100 mg via ORAL
  Filled 2021-11-05 (×4): qty 1

## 2021-11-05 MED ORDER — ATORVASTATIN CALCIUM 40 MG PO TABS
40.0000 mg | ORAL_TABLET | Freq: Every evening | ORAL | Status: DC
  Administered 2021-11-05 – 2021-11-20 (×16): 40 mg via ORAL
  Filled 2021-11-05 (×16): qty 1

## 2021-11-05 MED ORDER — FAMOTIDINE IN NACL 20-0.9 MG/50ML-% IV SOLN: 20.0000 mg | Freq: Two times a day (BID) | INTRAVENOUS | Status: DC

## 2021-11-05 MED ORDER — AZITHROMYCIN 250 MG PO TABS
500.0000 mg | ORAL_TABLET | Freq: Every day | ORAL | Status: DC
  Administered 2021-11-05 – 2021-11-07 (×3): 500 mg via ORAL
  Filled 2021-11-05 (×3): qty 2

## 2021-11-05 MED ORDER — CLONAZEPAM 0.5 MG PO TABS
0.5000 mg | ORAL_TABLET | Freq: Two times a day (BID) | ORAL | Status: DC | PRN
  Administered 2021-11-13 – 2021-11-19 (×7): 0.5 mg via ORAL
  Filled 2021-11-05 (×8): qty 1

## 2021-11-05 MED ORDER — ACETAMINOPHEN 325 MG PO TABS
650.0000 mg | ORAL_TABLET | Freq: Four times a day (QID) | ORAL | Status: DC | PRN
  Administered 2021-11-05 – 2021-11-11 (×5): 650 mg via ORAL
  Filled 2021-11-05 (×6): qty 2

## 2021-11-05 MED ORDER — LEVOTHYROXINE SODIUM 112 MCG PO TABS
112.0000 ug | ORAL_TABLET | Freq: Every day | ORAL | Status: DC
  Administered 2021-11-06 – 2021-11-20 (×15): 112 ug via ORAL
  Filled 2021-11-05 (×16): qty 1

## 2021-11-05 MED ORDER — GABAPENTIN 400 MG PO CAPS
800.0000 mg | ORAL_CAPSULE | Freq: Two times a day (BID) | ORAL | Status: DC
  Administered 2021-11-05 – 2021-11-20 (×31): 800 mg via ORAL
  Filled 2021-11-05 (×31): qty 2

## 2021-11-05 MED ORDER — ARIPIPRAZOLE 2 MG PO TABS
2.0000 mg | ORAL_TABLET | Freq: Every day | ORAL | Status: DC
  Administered 2021-11-06 – 2021-11-20 (×15): 2 mg via ORAL
  Filled 2021-11-05 (×16): qty 1

## 2021-11-05 MED ORDER — SODIUM CHLORIDE 0.9 % IV SOLN
2.0000 g | INTRAVENOUS | Status: AC
  Administered 2021-11-05 – 2021-11-09 (×5): 2 g via INTRAVENOUS
  Filled 2021-11-05 (×5): qty 20

## 2021-11-05 MED ORDER — TAMSULOSIN HCL 0.4 MG PO CAPS
0.4000 mg | ORAL_CAPSULE | Freq: Every day | ORAL | Status: DC
  Administered 2021-11-06 – 2021-11-20 (×15): 0.4 mg via ORAL
  Filled 2021-11-05 (×16): qty 1

## 2021-11-05 MED ORDER — ENOXAPARIN SODIUM 30 MG/0.3ML IJ SOSY
30.0000 mg | PREFILLED_SYRINGE | Freq: Two times a day (BID) | INTRAMUSCULAR | Status: DC
  Administered 2021-11-06: 30 mg via SUBCUTANEOUS
  Filled 2021-11-05: qty 0.3

## 2021-11-05 MED ORDER — INSULIN ASPART 100 UNIT/ML IJ SOLN
0.0000 [IU] | Freq: Three times a day (TID) | INTRAMUSCULAR | Status: DC
  Administered 2021-11-06 (×2): 3 [IU] via SUBCUTANEOUS
  Administered 2021-11-07 – 2021-11-10 (×6): 2 [IU] via SUBCUTANEOUS
  Administered 2021-11-10: 3 [IU] via SUBCUTANEOUS
  Administered 2021-11-11: 2 [IU] via SUBCUTANEOUS
  Administered 2021-11-12 – 2021-11-13 (×2): 3 [IU] via SUBCUTANEOUS
  Administered 2021-11-13: 2 [IU] via SUBCUTANEOUS
  Administered 2021-11-14: 3 [IU] via SUBCUTANEOUS
  Administered 2021-11-14 – 2021-11-15 (×2): 2 [IU] via SUBCUTANEOUS
  Administered 2021-11-15: 3 [IU] via SUBCUTANEOUS
  Administered 2021-11-16 – 2021-11-18 (×5): 2 [IU] via SUBCUTANEOUS
  Administered 2021-11-18 – 2021-11-19 (×2): 3 [IU] via SUBCUTANEOUS
  Administered 2021-11-19 – 2021-11-20 (×2): 2 [IU] via SUBCUTANEOUS
  Administered 2021-11-20 (×2): 3 [IU] via SUBCUTANEOUS
  Administered 2021-11-21: 8 [IU] via SUBCUTANEOUS

## 2021-11-05 MED ORDER — AZELASTINE HCL 0.1 % NA SOLN
2.0000 | Freq: Two times a day (BID) | NASAL | Status: DC
  Administered 2021-11-05 – 2021-11-20 (×27): 2 via NASAL
  Filled 2021-11-05 (×2): qty 30

## 2021-11-05 MED ORDER — VENLAFAXINE HCL ER 150 MG PO CP24
300.0000 mg | ORAL_CAPSULE | Freq: Every day | ORAL | Status: DC
  Administered 2021-11-06 – 2021-11-20 (×15): 300 mg via ORAL
  Filled 2021-11-05 (×15): qty 2

## 2021-11-05 MED ORDER — PANTOPRAZOLE SODIUM 40 MG PO TBEC
40.0000 mg | DELAYED_RELEASE_TABLET | Freq: Every day | ORAL | Status: DC
  Administered 2021-11-06 – 2021-11-20 (×15): 40 mg via ORAL
  Filled 2021-11-05 (×15): qty 1

## 2021-11-05 MED ORDER — INSULIN ASPART 100 UNIT/ML IJ SOLN
0.0000 [IU] | Freq: Every day | INTRAMUSCULAR | Status: DC
  Administered 2021-11-20: 2 [IU] via SUBCUTANEOUS

## 2021-11-05 MED ORDER — FLUTICASONE PROPIONATE 50 MCG/ACT NA SUSP
2.0000 | Freq: Every day | NASAL | Status: DC
  Administered 2021-11-05 – 2021-11-20 (×15): 2 via NASAL
  Filled 2021-11-05 (×2): qty 16

## 2021-11-05 NOTE — H&P (Addendum)
? ?NAME:  Bryan Tyler, MRN:  [id number], DOB:  [birth year], LOS: 0 ?ADMISSION DATE:  10/27/2021, CONSULTATION DATE:  3/28 ?REFERRING MD:  [place], [facility], CHIEF COMPLAINT:  Dyspnea  ? ?History of Present Illness:  ?This is a pleasant 76 year old male with a past medical history significant for idiopathic pulmonary fibrosis who has been followed by my partners in the pulmonary clinic for the same for several years who presents to our facility on transfer from [facility] in [facility] with a diagnosis of acute respiratory failure with hypoxemia due to an IPF exacerbation.  The patient has previously been followed by Dr. Vaughan Browner and has been enrolled in a clinical trial since January of this year entitled:  ?FGCL-3019-095 (FibroGen Study) is a Phase 3, randomized, double-blind, placebo-controlled multicenter international study to evaluate the efficacy and safety of 30 mg/kg IV infusions of pamrevlumab administered every 3 weeks for 52 weeks as compared to placebo in subjects with Idiopathic Pulmonary Fibrosis. Primary end point is: change in FVC from baseline at week 52. ZEPHYRUS II STUDY. ? ?The patient tells me that in prior years he was intolerant of antifibrotic agents for pulmonary fibrosis.  However, he has remained active, and says that he was not using oxygen prior to admission.  Approximately 1 week prior to admission he said that after going to the gym he felt "a little different" but did not complain of shortness of breath or cough.  On Friday, March 23 he traveled to [facility] where shortly after arrival he began to become very short of breath with associated headache and dry cough.  He denies fevers chills, rash, nausea, vomiting, chest pain or sinus complaint.  He is unaware of any sick contacts.  After 2 days of dyspnea his son-in-law took him to the emergency department in [place] where he was admitted for severe acute respiratory failure with hypoxemia by the  hospitalist service.  Pulmonary was consulted at [facility], specifically Drs. Posey Pronto and [place].  The patient had a work-up in [place] which included a respiratory viral panel which was negative for COVID and flu however an extended panel showed rhinovirus infection.  Lab work showed a BNP which is mildly elevated at 136 troponin was slightly elevated.  He had a CT angiogram of his chest which showed no pulmonary embolism but he did have severe pulmonary fibrosis and bilateral peripheral opacification.  He was treated with IV steroids, azithromycin and cefepime.  An echocardiogram was performed.  On admission his oxygen needs were 6 L of nasal cannula (March 25) but he was moved to the intensive care unit for progressive worsening of oxygenation and required heated high flow.  Request for transfer to [name] was made on March 27 but there were no beds available so he was moved today March 28.  By the time of being moved he was transferred on Optiflow (heated high flow) at 40 L/min and 60% FiO2. ? ?He had a blood gas on March 26 at 1 AM: 7.43/33.6/79 point 09/02/1988 7% on 36% FiO2. ? ?Other lab shows a mycoplasma IgM MRSA swab negative basic metabolic panel is normal troponin slightly 77, she is normal with the exception of a slightly elevated [name].  This is 1.2/dL ? ?The patient states that he currently feels some short of breath but in general he is feeling better since he came to the hospital. ? ?He denies leg swelling or chest pain.   ? ?Pertinent  Medical History  ?Idiopathic pulmonary fibrosis ?  Adenocarcinoma of the prostate ?Thoracic aortic aneurysm ?Anxiety ?Depression ?Diverticulosis ?GERD ?Hyperlipidemia ?Hypertension ?Hypothyroidism ?Obstructive sleep apnea, not on CPAP ?Osteoporosis ? ?Significant Hospital Events: ?Including procedures, antibiotic start and stop dates in addition to other pertinent events   ?March 25, admitted to [facility] in [place], pulmonary consulted, treated with  steroids, ceftriaxone, azithromycin for an IPF flare, noted to be rhinovirus positive.  COVID and flu testing negative ?March 26 moved to the intensive care unit for heated high flow ?March 28 transferred to [facility] long hospital ? ?Interim History / Subjective:  ?As above ? ?Objective   ?Blood pressure (!) 159/65, pulse (!) 102, resp. rate (!) 22, SpO2 90 %. ?   ?FiO2 (%):  [50 %] 50 %  ?No intake or output data in the 24 hours ending 10/27/2021 1817 ?There were no vitals filed for this visit. ? ?Examination: ? ? ?General:  Resting comfortably in bed ?HENT: NCAT OP clear ?PULM: Crackles bases B, increased effort effort ?CV: RRR, no mgr ?GI: BS+, soft, nontender ?MSK: normal bulk and tone ?Neuro: awake, alert, no distress, MAEW ? ? ?Resolved Hospital Problem list   ? ? ?Assessment & Plan:  ?Acute respiratory failure with hypoxemia due to an IPF flare due to acute rhinovirus infection ?Admit to ICU ?On clinical trial/research protocol for idiopathic pulmonary fibrosis investigational agent (FGCL-3019-095 (FibroGen Study)) ?Heated high flow titrated to patient comfort and SaO2 greater than 85% ?Tolerate periods of hypoxemia, goal at rest is greater than 85% SaO2, with movement ideally above 75% ?Out of bed to chair as able ?Incentive spirometry is important, use every hour ?Solu-Medrol 80 mg IV twice daily ?Chest x-ray ?Twelve-lead EKG ?Continue ceftriaxone and azithromycin ?Check urine strep and Legionella antigen ?Notify research team he is here ? ?Hypothyroidism: ?Synthroid ? ?Hyperglycemia due to Solu-Medrol ?Sliding scale insulin and before every meal and nightly Accu-Cheks ? ?Anxiety ?depression ?Clonazepam as per home dosing: 0.5 twice daily as needed anxiety, 1.5 mg nightly as needed insomnia ?effexor ? ?Dementia: ?Aricept ? ?BPH/history of prostate cancer ?Flomax ? ?GERD: ?Protonix ? ? ?Best Practice (right click and "Reselect all SmartList Selections" daily)  ? ?Diet/type: Regular consistency (see orders) ?DVT  prophylaxis: LMWH ?GI prophylaxis: PPI ?Lines: N/A ?Foley:  N/A ?Code Status:  DNR ?Last date of multidisciplinary goals of care discussion [3/28] ? ?Labs   ?CBC: ?No results for input(s): WBC, NEUTROABS, HGB, HCT, MCV, PLT in the last 168 hours. ? ?Basic Metabolic Panel: ?No results for input(s): NA, K, CL, CO2, GLUCOSE, BUN, CREATININE, CALCIUM, MG, PHOS in the last 168 hours. ?GFR: ?CrCl cannot be calculated (Patient's most recent lab result is older than the maximum 21 days allowed.). ?No results for input(s): PROCALCITON, WBC, LATICACIDVEN in the last 168 hours. ? ?Liver Function Tests: ?No results for input(s): AST, ALT, ALKPHOS, BILITOT, PROT, ALBUMIN in the last 168 hours. ?No results for input(s): LIPASE, AMYLASE in the last 168 hours. ?No results for input(s): AMMONIA in the last 168 hours. ? ?ABG ?   ?Component Value Date/Time  ? TCO2 28 12/22/2015 2119  ?  ? ?Coagulation Profile: ?No results for input(s): INR, PROTIME in the last 168 hours. ? ?Cardiac Enzymes: ?No results for input(s): CKTOTAL, CKMB, CKMBINDEX, TROPONINI in the last 168 hours. ? ?HbA1C: ?Hgb A1c MFr Bld  ?Date/Time Value Ref Range Status  ?05/25/2020 09:59 AM 6.0 (H) <5.7 % of total Hgb Final  ?  Comment:  ?  For someone without known diabetes, a hemoglobin  ?A1c value between 5.7% and  6.4% is consistent with ?prediabetes and should be confirmed with a  ?follow-up test. ?. ?For someone with known diabetes, a value <7% ?indicates that their diabetes is well controlled. A1c ?targets should be individualized based on duration of ?diabetes, age, comorbid conditions, and other ?considerations. ?. ?This assay result is consistent with an increased risk ?of diabetes. ?. ?Currently, no consensus exists regarding use of ?hemoglobin A1c for diagnosis of diabetes for children. ?. ?  ? ? ?CBG: ?No results for input(s): GLUCAP in the last 168 hours. ? ?Review of Systems:   ?Gen: Denies fever, chills, weight change, fatigue, night sweats ?HEENT:  Denies blurred vision, double vision, hearing loss, tinnitus, sinus congestion, rhinorrhea, sore throat, neck stiffness, dysphagia ?PULM: per HPI ?CV: Denies chest pain, edema, orthopnea, paroxysmal nocturnal d

## 2021-11-05 NOTE — Progress Notes (Signed)
Title: FGCL-3019-095 (FibroGen Study) is a Phase 3, randomized, double-blind, placebo-controlled multicenter international study to evaluate the efficacy and safety of 30 mg/kg IV infusions of pamrevlumab administered every 3 weeks for 52 weeks as compared to placebo in subjects with Idiopathic Pulmonary Fibrosis. Primary end point is: change in FVC from baseline at week 52. ZEPHYRUS II STUDY ? ?Protocol #: FGCL-3019-095, Clinical Trials #: [id number] Sponsor: [email or web address]  ?([place], [place], [place]) ? ? ?Xxx ?Serious Adverse Event (SAE) Report to the sponsor was submitted earlier today by Lytle Michaels - [place] on behalf of the undersigned principal investigator. Dx of Acute IPF flare due to rhinovirus ? ?[facility] research team will track progress in chart ? ? ?SIGNATURE  ? ? ?Dr. Brand Males, M.D., F.C.C.P, ACRP-CPI ?Pulmonary and Critical Care Medicine ?Company secretary, [facility] [place] ?Staff Physician, [place] ?Center Director - Interstitial Lung Disease  Program  ?Pulmonary [facility] and [facility] [place] ?[place], [place], [address] ? ? ?Pager: [phone], If no answer  OR between  19:00-7:00h: page [phone]  [phone]  [phone] ?Telephone (research): [phone] ? ?11:34 PM ?10/24/2021 ? ? ?

## 2021-11-05 NOTE — Plan of Care (Signed)
?  Interdisciplinary Goals of Care Family Meeting ? ? ?Date carried out:: 10/14/2021 ? ?Location of the meeting: Bedside ? ?Member's involved: Physician and Family Member or next of kin ? ?Durable Power of Tour manager: patient   ? ?Discussion: We discussed goals of care for Bryan Tyler .  He explained to me that he does not want to go on life support or receive CPR in the event of a respiratory or cardiac arrest.  He wants a full scope of practice otherwise.  This is very reasonable considering the overall poor prognosis from an idiopathic pulmonary fibrosis flare. ? ?Code status: Full DNR ? ?Disposition: Continue current acute care ? ? ?Time spent for the meeting: 5 minutes ? ?[name] ?11/07/2021, 6:44 PM ? ?

## 2021-11-06 DIAGNOSIS — J84112 Idiopathic pulmonary fibrosis: Secondary | ICD-10-CM | POA: Diagnosis not present

## 2021-11-06 LAB — CBC
HCT: 33.2 % — ABNORMAL LOW (ref 39.0–52.0)
Hemoglobin: 10.7 g/dL — ABNORMAL LOW (ref 13.0–17.0)
MCH: 28.2 pg (ref 26.0–34.0)
MCHC: 32.2 g/dL (ref 30.0–36.0)
MCV: 87.4 fL (ref 80.0–100.0)
Platelets: 300 10*3/uL (ref 150–400)
RBC: 3.8 MIL/uL — ABNORMAL LOW (ref 4.22–5.81)
RDW: 15.6 % — ABNORMAL HIGH (ref 11.5–15.5)
WBC: 18 10*3/uL — ABNORMAL HIGH (ref 4.0–10.5)
nRBC: 0 % (ref 0.0–0.2)

## 2021-11-06 LAB — GLUCOSE, CAPILLARY
Glucose-Capillary: 109 mg/dL — ABNORMAL HIGH (ref 70–99)
Glucose-Capillary: 162 mg/dL — ABNORMAL HIGH (ref 70–99)
Glucose-Capillary: 192 mg/dL — ABNORMAL HIGH (ref 70–99)
Glucose-Capillary: 140 mg/dL — ABNORMAL HIGH (ref 70–99)

## 2021-11-06 LAB — BASIC METABOLIC PANEL
Anion gap: 3 — ABNORMAL LOW (ref 5–15)
BUN: 27 mg/dL — ABNORMAL HIGH (ref 8–23)
CO2: 29 mmol/L (ref 22–32)
Calcium: 8.1 mg/dL — ABNORMAL LOW (ref 8.9–10.3)
Chloride: 105 mmol/L (ref 98–111)
Creatinine, Ser: 0.67 mg/dL (ref 0.61–1.24)
GFR, Estimated: >60 mL/min (ref >60)
Glucose, Bld: 168 mg/dL — ABNORMAL HIGH (ref 70–99)
Potassium: 4.1 mmol/L (ref 3.5–5.1)
Sodium: 137 mmol/L (ref 135–145)

## 2021-11-06 LAB — BLOOD GAS, ARTERIAL
Acid-Base Excess: 5.1 mmol/L — ABNORMAL HIGH (ref 0.0–2.0)
Bicarbonate: 29.9 mmol/L — ABNORMAL HIGH (ref 20.0–28.0)
Drawn by: 56037
FIO2: 45 %
O2 Content: 45 L/min
O2 Saturation: 97 %
Patient temperature: 36.4
pCO2 arterial: 43 mmHg (ref 32–48)
pH, Arterial: 7.45 (ref 7.35–7.45)
pO2, Arterial: 88 mmHg (ref 83–108)

## 2021-11-06 LAB — MAGNESIUM: Magnesium: 2.2 mg/dL (ref 1.7–2.4)

## 2021-11-06 LAB — STREP PNEUMONIAE URINARY ANTIGEN: Strep Pneumo Urinary Antigen: NEGATIVE

## 2021-11-06 LAB — PHOSPHORUS: Phosphorus: 3.3 mg/dL (ref 2.5–4.6)

## 2021-11-06 MED ORDER — IBUPROFEN 200 MG PO TABS
400.0000 mg | ORAL_TABLET | Freq: Once | ORAL | Status: AC | PRN
Start: 2021-11-06 — End: 2021-11-12
  Administered 2021-11-12: 400 mg via ORAL
  Filled 2021-11-06: qty 2

## 2021-11-06 MED ORDER — CLONAZEPAM 1 MG PO TABS
1.5000 mg | ORAL_TABLET | Freq: Every day | ORAL | Status: DC
  Administered 2021-11-06 – 2021-11-19 (×14): 1.5 mg via ORAL
  Filled 2021-11-06 (×14): qty 1

## 2021-11-06 MED ORDER — HYDROCODONE-ACETAMINOPHEN 5-325 MG PO TABS
1.0000 | ORAL_TABLET | Freq: Four times a day (QID) | ORAL | Status: DC | PRN
  Administered 2021-11-06 – 2021-11-21 (×21): 1 via ORAL
  Filled 2021-11-06 (×21): qty 1

## 2021-11-06 MED ORDER — CHLORHEXIDINE GLUCONATE CLOTH 2 % EX PADS
6.0000 | MEDICATED_PAD | Freq: Every day | CUTANEOUS | Status: DC
  Administered 2021-11-06 – 2021-11-20 (×15): 6 via TOPICAL

## 2021-11-06 MED ORDER — ENOXAPARIN SODIUM 40 MG/0.4ML IJ SOSY
40.0000 mg | PREFILLED_SYRINGE | INTRAMUSCULAR | Status: DC
Start: 2021-11-06 — End: 2021-11-21
  Administered 2021-11-07 – 2021-11-20 (×14): 40 mg via SUBCUTANEOUS
  Filled 2021-11-06 (×15): qty 0.4

## 2021-11-06 NOTE — Progress Notes (Signed)
? ?NAME:  Bryan Tyler, MRN:  [id number], DOB:  [birth year], LOS: 1 ?ADMISSION DATE:  11/08/2021, CONSULTATION DATE:  3/28 ?REFERRING MD:  Lingamgunta, [place], CHIEF COMPLAINT:  Dyspnea  ? ?History of Present Illness:  ?This is a pleasant 76 year old male with a past medical history significant for idiopathic pulmonary fibrosis who has been followed by my partners in the pulmonary clinic for the same for several years who presents to our facility on transfer from [facility] in [place] with a diagnosis of acute respiratory failure with hypoxemia due to an IPF exacerbation.  The patient has previously been followed by Dr. Vaughan Browner and has been enrolled in a clinical trial since January of this year entitled:  ?FGCL-3019-095 (FibroGen Study) is a Phase 3, randomized, double-blind, placebo-controlled multicenter international study to evaluate the efficacy and safety of 30 mg/kg IV infusions of pamrevlumab administered every 3 weeks for 52 weeks as compared to placebo in subjects with Idiopathic Pulmonary Fibrosis. Primary end point is: change in FVC from baseline at week 52. ZEPHYRUS II STUDY. ? ?The patient tells me that in prior years he was intolerant of antifibrotic agents for pulmonary fibrosis.  However, he has remained active, and says that he was not using oxygen prior to admission.  Approximately 1 week prior to admission he said that after going to the gym he felt "a little different" but did not complain of shortness of breath or cough.  On Friday, March 23 he traveled to [facility] where shortly after arrival he began to become very short of breath with associated headache and dry cough.  He denies fevers chills, rash, nausea, vomiting, chest pain or sinus complaint.  He is unaware of any sick contacts.  After 2 days of dyspnea his son-in-law took him to the emergency department in [place] where he was admitted for severe acute respiratory failure with hypoxemia by the  hospitalist service.  Pulmonary was consulted at [facility], specifically Drs. Posey Pronto and [place].  The patient had a work-up in [place] which included a respiratory viral panel which was negative for COVID and flu however an extended panel showed rhinovirus infection.  Lab work showed a BNP which is mildly elevated at 136 troponin was slightly elevated.  He had a CT angiogram of his chest which showed no pulmonary embolism but he did have severe pulmonary fibrosis and bilateral peripheral opacification.  He was treated with IV steroids, azithromycin and cefepime.  An echocardiogram was performed.  On admission his oxygen needs were 6 L of nasal cannula (March 25) but he was moved to the intensive care unit for progressive worsening of oxygenation and required heated high flow.  Request for transfer to [name] was made on March 27 but there were no beds available so he was moved today March 28.  By the time of being moved he was transferred on Optiflow (heated high flow) at 40 L/min and 60% FiO2. ? ?He had a blood gas on March 26 at 1 AM: 7.43/33.6/79 point 09/02/1988 7% on 36% FiO2. ? ?Other lab shows a mycoplasma IgM MRSA swab negative basic metabolic panel is normal troponin slightly 77, she is normal with the exception of a slightly elevated [name].  This is 1.2/dL ? ?The patient states that he currently feels some short of breath but in general he is feeling better since he came to the hospital. ? ?He denies leg swelling or chest pain.   ? ?Pertinent  Medical History  ?Idiopathic pulmonary fibrosis ?  Adenocarcinoma of the prostate ?Thoracic aortic aneurysm ?Anxiety ?Depression ?Diverticulosis ?GERD ?Hyperlipidemia ?Hypertension ?Hypothyroidism ?Obstructive sleep apnea, not on CPAP ?Osteoporosis ? ?Significant Hospital Events: ?Including procedures, antibiotic start and stop dates in addition to other pertinent events   ?March 25, admitted to [facility] in [place], pulmonary consulted, treated with  steroids, ceftriaxone, azithromycin for an IPF flare, noted to be rhinovirus positive.  COVID and flu testing negative ?March 26 moved to the intensive care unit for heated high flow ?March 28 transferred to [facility] long hospital ? ?Interim History / Subjective:  ?No acute events overnight. On 70% FiO2 at 45 LPM this morning with sats in the high 90s.  ? ?Objective   ?Blood pressure (!) 183/79, pulse 89, temperature 97.7 ?F (36.5 ?C), temperature source Oral, resp. rate 19, SpO2 99 %. ?   ?FiO2 (%):  [50 %-70 %] 70 %  ? ?Intake/Output Summary (Last 24 hours) at 11/06/2021 1100 ?Last data filed at 11/06/2021 0604 ?Gross per 24 hour  ?Intake 97.76 ml  ?Output 375 ml  ?Net -277.24 ml  ? ?There were no vitals filed for this visit. ? ?Examination: ?General:  Elderly male resting comfortably in bed ?HENT: [place]/AT, PERRL, no JVD ?PULM: bibasilar coarse crackles. Unlabored.  ?CV: RRR, no MRG ?GI: Soft, non-tender, normoactive.  ?MSK: No acute deformity or ROM limitation.  ?Neuro: alert, oriented, non-focal.  ? ? ?Resolved Hospital Problem list   ? ? ?Assessment & Plan:  ?Acute respiratory failure with hypoxemia due to an IPF flare due to acute rhinovirus infection ?On clinical trial/research protocol for idiopathic pulmonary fibrosis investigational agent ([id number] (FibroGen Study)) ?-HFNC titrated to sat goal 85 > 95% at rest.  ?- Tolerate periods of hypoxemia. Sat goal with movement ideally above 75% ?- Out of bed to chair as able ?- Incentive spirometry is important, use every hour ?- Solu-Medrol 80 mg IV twice daily ?- Twelve-lead EKG ?- Continue ceftriaxone and azithromycin ?- Urine legionella pending.  ?- Research team has been notified of his admission.  ? ?Hypothyroidism: ?- Synthroid ? ?Hyperglycemia due to Solu-Medrol ?- Sliding scale insulin and before every meal and nightly Accu-Cheks ? ?Anxiety ?depression ?- Clonazepam as per home dosing: 0.5 twice daily as needed anxiety, 1.5 mg nightly insomnia ?-  Effexor ? ?Headache: d/t high flow per patient.  ?- PRN tylenol ?- renal function fine, will do one time dose of ibuprofen.  ? ?Dementia: ?Aricept ? ?BPH/history of prostate cancer ?Flomax ? ?GERD: ?Protonix ? ? ?Best Practice (right click and "Reselect all SmartList Selections" daily)  ? ?Diet/type: Regular consistency (see orders) ?DVT prophylaxis: LMWH ?GI prophylaxis: PPI ?Lines: N/A ?Foley:  N/A ?Code Status:  DNR ?Last date of multidisciplinary goals of care discussion [3/28] ? ?Critical care time: 36 minutes ?  ? ? ?Georgann Housekeeper, AGACNP-BC ?[facility] ? ?See Amion for personal pager ?PCCM on call pager [phone] until 7pm. ?Please call Elink 7p-7a. [phone] ? ?11/06/2021 11:07 AM ? ? ? ? ? ? ? ?

## 2021-11-06 NOTE — Progress Notes (Signed)
eLink Physician-Brief Progress Note ?Patient Name: Bryan Tyler ?DOB: June 25, 1946 ?MRN: [id number] ? ? ?Date of Service ? 11/06/2021  ?HPI/Events of Note ? Patient needs an AM ABG.  ?eICU Interventions ? ABG ordered for the AM.  ? ? ? ?  ? ?[name] ?11/06/2021, 3:16 AM ?

## 2021-11-06 NOTE — Progress Notes (Signed)
eLink Physician-Brief Progress Note ?Patient Name: Bryan Tyler ?DOB: January 20, 1946 ?MRN: [id number] ? ? ?Date of Service ? 11/06/2021  ?HPI/Events of Note ? ABG reviewed.  ?eICU Interventions ?   ? ? ? ?  ? ?[name] ?11/06/2021, 6:05 AM ?

## 2021-11-06 NOTE — Progress Notes (Signed)
Patient wob of breathing worsening overnight. ABG drawn and found to be insignificant. Patient has been using bedside urinal but dropped his sats down to 62%. Recommend no ambulation unless improvement. Patient does not recover quickly.  ?

## 2021-11-07 DIAGNOSIS — J84112 Idiopathic pulmonary fibrosis: Secondary | ICD-10-CM | POA: Diagnosis not present

## 2021-11-07 DIAGNOSIS — J9601 Acute respiratory failure with hypoxia: Secondary | ICD-10-CM

## 2021-11-07 LAB — GLUCOSE, CAPILLARY
Glucose-Capillary: 103 mg/dL — ABNORMAL HIGH (ref 70–99)
Glucose-Capillary: 134 mg/dL — ABNORMAL HIGH (ref 70–99)
Glucose-Capillary: 144 mg/dL — ABNORMAL HIGH (ref 70–99)
Glucose-Capillary: 136 mg/dL — ABNORMAL HIGH (ref 70–99)

## 2021-11-07 LAB — CBC
HCT: 37.7 % — ABNORMAL LOW (ref 39.0–52.0)
Hemoglobin: 11.8 g/dL — ABNORMAL LOW (ref 13.0–17.0)
MCH: 27.8 pg (ref 26.0–34.0)
MCHC: 31.3 g/dL (ref 30.0–36.0)
MCV: 88.7 fL (ref 80.0–100.0)
Platelets: 336 10*3/uL (ref 150–400)
RBC: 4.25 MIL/uL (ref 4.22–5.81)
RDW: 15.4 % (ref 11.5–15.5)
WBC: 19.4 10*3/uL — ABNORMAL HIGH (ref 4.0–10.5)
nRBC: 0 % (ref 0.0–0.2)

## 2021-11-07 LAB — COMPREHENSIVE METABOLIC PANEL
ALT: 30 U/L (ref 0–44)
AST: 26 U/L (ref 15–41)
Albumin: 3 g/dL — ABNORMAL LOW (ref 3.5–5.0)
Alkaline Phosphatase: 73 U/L (ref 38–126)
Anion gap: 6 (ref 5–15)
BUN: 23 mg/dL (ref 8–23)
CO2: 29 mmol/L (ref 22–32)
Calcium: 8.3 mg/dL — ABNORMAL LOW (ref 8.9–10.3)
Chloride: 102 mmol/L (ref 98–111)
Creatinine, Ser: 0.74 mg/dL (ref 0.61–1.24)
GFR, Estimated: >60 mL/min (ref >60)
Glucose, Bld: 139 mg/dL — ABNORMAL HIGH (ref 70–99)
Potassium: 4.1 mmol/L (ref 3.5–5.1)
Sodium: 137 mmol/L (ref 135–145)
Total Bilirubin: 0.8 mg/dL (ref 0.3–1.2)
Total Protein: 6.3 g/dL — ABNORMAL LOW (ref 6.5–8.1)

## 2021-11-07 LAB — LEGIONELLA PNEUMOPHILA SEROGP 1 UR AG: L. pneumophila Serogp 1 Ur Ag: NEGATIVE

## 2021-11-07 LAB — MAGNESIUM: Magnesium: 2.2 mg/dL (ref 1.7–2.4)

## 2021-11-07 LAB — PHOSPHORUS: Phosphorus: 3.7 mg/dL (ref 2.5–4.6)

## 2021-11-07 MED ORDER — AMLODIPINE BESYLATE 5 MG PO TABS
5.0000 mg | ORAL_TABLET | Freq: Every day | ORAL | Status: DC
  Administered 2021-11-07 – 2021-11-20 (×14): 5 mg via ORAL
  Filled 2021-11-07 (×15): qty 1

## 2021-11-07 MED ORDER — GUAIFENESIN-DM 100-10 MG/5ML PO SYRP
5.0000 mL | ORAL_SOLUTION | ORAL | Status: DC | PRN
  Administered 2021-11-07 – 2021-11-20 (×24): 5 mL via ORAL
  Filled 2021-11-07 (×5): qty 10
  Filled 2021-11-07 (×2): qty 5
  Filled 2021-11-07 (×3): qty 10
  Filled 2021-11-07: qty 5
  Filled 2021-11-07: qty 10
  Filled 2021-11-07 (×2): qty 5
  Filled 2021-11-07 (×5): qty 10
  Filled 2021-11-07: qty 5
  Filled 2021-11-07 (×4): qty 10

## 2021-11-07 MED ORDER — BENZONATATE 100 MG PO CAPS
100.0000 mg | ORAL_CAPSULE | Freq: Three times a day (TID) | ORAL | Status: DC | PRN
  Administered 2021-11-07 – 2021-11-20 (×14): 100 mg via ORAL
  Filled 2021-11-07 (×15): qty 1

## 2021-11-07 NOTE — Progress Notes (Signed)
eLink Physician-Brief Progress Note ?Patient Name: Bryan Tyler ?DOB: [birth year] ?MRN: [id number] ? ? ?Date of Service ? 11/07/2021  ?HPI/Events of Note ? Patient with persistent cough.  ?eICU Interventions ? Robitussin DM ordered.  ? ? ? ?  ? ?Frederik Pear ?11/07/2021, 9:28 PM ?

## 2021-11-07 NOTE — Progress Notes (Signed)
? ?NAME:  Bryan Tyler, MRN:  [id number], DOB:  1946/02/23, LOS: 2 ?ADMISSION DATE:  10/26/2021, CONSULTATION DATE:  3/28 ?REFERRING MD:  Lingamgunta, [place], CHIEF COMPLAINT:  Dyspnea  ? ?History of Present Illness:  ?This is a pleasant 76 year old male with a past medical history significant for idiopathic pulmonary fibrosis who has been followed by my partners in the pulmonary clinic for the same for several years who presents to our facility on transfer from [facility] in [facility] with a diagnosis of acute respiratory failure with hypoxemia due to an IPF exacerbation.  The patient has previously been followed by Dr. Vaughan Browner and has been enrolled in a clinical trial since January of this year entitled:  ?FGCL-3019-095 (FibroGen Study) is a Phase 3, randomized, double-blind, placebo-controlled multicenter international study to evaluate the efficacy and safety of 30 mg/kg IV infusions of pamrevlumab administered every 3 weeks for 52 weeks as compared to placebo in subjects with Idiopathic Pulmonary Fibrosis. Primary end point is: change in FVC from baseline at week 52. ZEPHYRUS II STUDY. ? ?The patient tells me that in prior years he was intolerant of antifibrotic agents for pulmonary fibrosis.  However, he has remained active, and says that he was not using oxygen prior to admission.  Approximately 1 week prior to admission he said that after going to the gym he felt "a little different" but did not complain of shortness of breath or cough.  On Friday, March 23 he traveled to [facility] where shortly after arrival he began to become very short of breath with associated headache and dry cough.  He denies fevers chills, rash, nausea, vomiting, chest pain or sinus complaint.  He is unaware of any sick contacts.  After 2 days of dyspnea his son-in-law took him to the emergency department in [place] where he was admitted for severe acute respiratory failure with hypoxemia by the  hospitalist service.  Pulmonary was consulted at [facility], specifically Drs. Posey Pronto and [place].  The patient had a work-up in [place] which included a respiratory viral panel which was negative for COVID and flu however an extended panel showed rhinovirus infection.  Lab work showed a BNP which is mildly elevated at 136 troponin was slightly elevated.  He had a CT angiogram of his chest which showed no pulmonary embolism but he did have severe pulmonary fibrosis and bilateral peripheral opacification.  He was treated with IV steroids, azithromycin and cefepime.  An echocardiogram was performed.  On admission his oxygen needs were 6 L of nasal cannula (March 25) but he was moved to the intensive care unit for progressive worsening of oxygenation and required heated high flow.  Request for transfer to [name] was made on March 27 but there were no beds available so he was moved today March 28.  By the time of being moved he was transferred on Optiflow (heated high flow) at 40 L/min and 60% FiO2. ? ?He had a blood gas on March 26 at 1 AM: 7.43/33.6/79 point 09/02/1988 7% on 36% FiO2. ? ?Other lab shows a mycoplasma IgM MRSA swab negative basic metabolic panel is normal troponin slightly 77, she is normal with the exception of a slightly elevated [name].  This is 1.2/dL ? ?The patient states that he currently feels some short of breath but in general he is feeling better since he came to the hospital. ? ?He denies leg swelling or chest pain.   ? ?Pertinent  Medical History  ?Idiopathic pulmonary fibrosis ?  Adenocarcinoma of the prostate ?Thoracic aortic aneurysm ?Anxiety ?Depression ?Diverticulosis ?GERD ?Hyperlipidemia ?Hypertension ?Hypothyroidism ?Obstructive sleep apnea, not on CPAP ?Osteoporosis ? ?Significant Hospital Events: ?Including procedures, antibiotic start and stop dates in addition to other pertinent events   ?March 25, admitted to [facility] in [place], pulmonary consulted, treated with  steroids, ceftriaxone, azithromycin for an IPF flare, noted to be rhinovirus positive.  COVID and flu testing negative ?March 26 moved to the intensive care unit for heated high flow ?March 28 transferred to [facility] long hospital ? ?Interim History / Subjective:  ?Coughing spells common, hard to catch his breath after ? ?Notable labs: calcium 8.3, albumin 3, WBC 19.4, Hgb 11.8 ? ?Objective   ?Blood pressure (!) 172/90, pulse 100, temperature 97.6 ?F (36.4 ?C), temperature source Oral, resp. rate (!) 26, SpO2 93 %. ?   ?FiO2 (%):  [60 %-70 %] 70 %  ? ?Intake/Output Summary (Last 24 hours) at 11/07/2021 0914 ?Last data filed at 11/07/2021 0845 ?Gross per 24 hour  ?Intake 473.53 ml  ?Output 1976 ml  ?Net -1502.47 ml  ? ? ?There were no vitals filed for this visit. ? ?Examination:  ?General:  Elderly male resting comfortably in bed ?HENT: [place]/AT, PERRL, no JVD ?PULM: Rhonchi in the bases. Tachypnea. 70% 25LPM ?CV: RRR, no MRG ?GI: Soft, non-tender, non-distended ?MSK: No deformity. No edema. Moving all extremities 5/5 strength ?Neuro: Alert, oriented, non-focal. ? ? ?Resolved Hospital Problem list   ? ? ?Assessment & Plan:  ?Acute respiratory failure with hypoxemia due to an IPF flare due to acute rhinovirus infection ?On clinical trial/research protocol for idiopathic pulmonary fibrosis investigational agent (FGCL-3019-095 (FibroGen Study)) ?- HFNC titrated to sat goal 85 to 95% at rest. (70% 25LPM currently) ?- Tolerate periods of hypoxemia. Sat goal with movement ideally above 75% ?- Out of bed to chair as able ?- Incentive spirometry is important, use every hour ?- Solu-Medrol 80 mg IV twice daily ?- Continue ceftriaxone and azithromycin ?- Urine legionella pending.  ?- Research team has been notified of his admission.  ?- Tessalon for cough ?- This will likely be a long recovery period. Uncertain what his eventual new baseline will look like.  ? ?Hypothyroidism: ?- Synthroid ? ?Hyperglycemia due to Solu-Medrol ?-  Sliding scale insulin and before every meal and nightly Accu-Cheks ? ?Anxiety ?depression ?- Clonazepam as per home dosing: 0.5 twice daily as needed anxiety, 1.5 mg nightly insomnia ?- Effexor ? ?Headache: d/t high flow per patient.  ?- PRN tylenol ?- Hydrocodone as needed ? ?Dementia: ?Aricept ? ?BPH/history of prostate cancer ?Flomax ? ?GERD: ?Protonix ? ? ?Best Practice (right click and "Reselect all SmartList Selections" daily)  ? ?Diet/type: Regular consistency (see orders) ?DVT prophylaxis: LMWH ?GI prophylaxis: PPI ?Lines: N/A ?Foley:  N/A ?Code Status:  DNR ?Last date of multidisciplinary goals of care discussion [3/28] ? ?Critical care time: 36 minutes ?  ? ? ?Georgann Housekeeper, AGACNP-BC ?[facility] ? ?See Amion for personal pager ?PCCM on call pager [phone] until 7pm. ?Please call Elink 7p-7a. [phone] ? ?11/07/2021 9:14 AM ? ? ? ? ? ? ? ?

## 2021-11-07 NOTE — Progress Notes (Signed)
eLink Physician-Brief Progress Note ?Patient Name: Bryan Tyler ?DOB: Feb 19, 1946 ?MRN: [id number] ? ? ?Date of Service ? 11/07/2021  ?HPI/Events of Note ? Pt requesting for help with cough. Pt with IPF but with coughing spells, has a hard time catching his breath.  ?eICU Interventions ? Trial of tessalon perles.   ? ? ? ?Intervention Category ?Intermediate Interventions: Other: ? ?Elsie Lincoln ?11/07/2021, 12:20 AM ?

## 2021-11-08 DIAGNOSIS — J9601 Acute respiratory failure with hypoxia: Secondary | ICD-10-CM | POA: Diagnosis not present

## 2021-11-08 DIAGNOSIS — J84112 Idiopathic pulmonary fibrosis: Secondary | ICD-10-CM | POA: Diagnosis not present

## 2021-11-08 LAB — GLUCOSE, CAPILLARY
Glucose-Capillary: 108 mg/dL — ABNORMAL HIGH (ref 70–99)
Glucose-Capillary: 113 mg/dL — ABNORMAL HIGH (ref 70–99)
Glucose-Capillary: 138 mg/dL — ABNORMAL HIGH (ref 70–99)
Glucose-Capillary: 128 mg/dL — ABNORMAL HIGH (ref 70–99)

## 2021-11-08 NOTE — Evaluation (Signed)
Clinical/Bedside Swallow Evaluation ?Patient Details  ?Name: Bryan Tyler ?MRN: [id number] ?Date of Birth: [birth year] ? ?Today's Date: 11/08/2021 ?Time: SLP Start Time (ACUTE ONLY): 0093 SLP Stop Time (ACUTE ONLY): 8182 ?SLP Time Calculation (min) (ACUTE ONLY): 20 min ? ?Past Medical History:  ?Past Medical History:  ?Diagnosis Date  ? Adenocarcinoma of prostate ([place]) 04/16/2013  ? Aneurysm, aorta, thoracic   ? Anxiety   ? Arthritis   ? "knees" (05/21/2017)  ? Depression   ? Diverticulosis   ? Dyspnea   ? on exertion for a long period time  ? GERD (gastroesophageal reflux disease)   ? Hyperlipidemia   ? Hypertension   ? Hypothyroidism, postradioiodine therapy   ? 1980's  ? Nocturia   ? Organic impotence   ? OSA (obstructive sleep apnea)   ? NO CPAP--  S/P SURGERY  2002  ? Osteoporosis   ? Pneumonia 1970  ? PONV (postoperative nausea and vomiting)   ? Urge urinary incontinence   ? Wears contact lenses   ? ?Past Surgical History:  ?Past Surgical History:  ?Procedure Laterality Date  ? HEMORRHOIDECTOMY WITH HEMORRHOID BANDING  2007  ? INGUINAL HERNIA REPAIR Bilateral   ? x4  (2 each side)  ? INSERTION PROSTATE RADIATION SEED    ? KNEE ARTHROSCOPY Right 2012  ? NASAL SEPTUM SURGERY  1982  ? w/ rhinoplasty  ? PROSTATE BIOPSY    ? RADIOACTIVE SEED IMPLANT N/A 05/30/2013  ? Procedure: RADIOACTIVE SEED IMPLANT;  Surgeon: Hanley Ben, MD;  Location: [place];  Service: Urology;  Laterality: N/A;  ? REVERSE SHOULDER ARTHROPLASTY Right 05/21/2017  ? REVERSE SHOULDER ARTHROPLASTY Right 05/21/2017  ? REVERSE SHOULDER ARTHROPLASTY Right 05/21/2017  ? Procedure: REVERSE RIGHT SHOULDER ARTHROPLASTY;  Surgeon: Justice Britain, MD;  Location: [place];  Service: Orthopedics;  Laterality: Right;  ? RHINOPLASTY  1982  ? w/w/ septoplasty  ? SHOULDER ARTHROSCOPY W/ ROTATOR CUFF REPAIR Right 2004  ? SHOULDER ARTHROSCOPY W/ ROTATOR CUFF REPAIR Left 2016  ? TONSILLECTOMY  AS CHILD  ? TOTAL KNEE ARTHROPLASTY Right 09/24/2018   ? Procedure: TOTAL KNEE ARTHROPLASTY;  Surgeon: Sydnee Cabal, MD;  Location: [facility];  Service: Orthopedics;  Laterality: Right;  Adductor Block  ? UVULOPALATOPHARYNGOPLASTY  2002  ? ?HPI:  ?Pt is a 76 yo male adm to [facility] with respiratory deficits.  Pt found to have rhinovirus 2017 Disc degeneration and spondylosis C4-5 and C5-6.  Copd, parasthesia,  GERD, Acute respiratory failure with hypoxemia due to an acute exacerbation of IPF from a rhinovirus infection; pt reports coughing with water to md  ?  ?Assessment / Plan / Recommendation  ?Clinical Impression ? Pt with functional oropharyngeal swallow based on clinical swallow evaluation. He easily passed 3 ounce Yale water screen- thus silent aspiration risk is low. He is s/p uvulectomy - 15 years ago and denies dysphagia beyond acute stage.  Pt's RR up to high 30s and suspect this may be source of his occasional coughing with water.  Pt reports coughing with water, denies any issues with solids.  Pt also has h/o reflux and can not rule out reflux contributing to pt's cough.  Given pt states this issue is only occuring currently with his illness, educated him to alternatives to po including drinking liquids with slight increased viscocity if prevents/decreases cough.  Using teach back and return demonstration educated wife and pt to increased risk of eating/drinking when dyspenic due to impaired reciprocity of swallow/respirations.  Pt and wife agreeable to implement  precautions as helpful and forgo any instrumental evaluation at this time. Recommend if pt's dysphagia does not resolve, consider esophagram given hx of reflux.  No further SLP indicated. Thanks for this consult. ?SLP Visit Diagnosis: Dysphagia, unspecified (R13.10) ?   ?Aspiration Risk ? Mild aspiration risk (with use of compensation strategies)  ?  ?Diet Recommendation Regular;Thin liquid (try liquids with slightly increased viscocity to see if decrease cough)  ? ?Liquid Administration via:  Cup;Straw ?Medication Administration: Other (Comment) (as pt desires) ?Supervision: Patient able to self feed ?Compensations: Slow rate;Small sips/bites (rest if dyspenic) ?Postural Changes: Seated upright at 90 degrees;Remain upright for at least 30 minutes after po intake  ?  ?Other  Recommendations     ? ?Recommendations for follow up therapy are one component of a multi-disciplinary discharge planning process, led by the attending physician.  Recommendations may be updated based on patient status, additional functional criteria and insurance authorization. ? ?Follow up Recommendations No SLP follow up  ? ? ?  ?Assistance Recommended at Discharge None  ?Functional Status Assessment Patient has not had a recent decline in their functional status  ?Frequency and Duration    ?  ?  ?   ? ?Prognosis    ? ?  ? ?Swallow Study   ?General Date of Onset: 11/08/21 ?HPI: Pt is a 76 yo male adm to [facility] with respiratory deficits.  Pt found to have rhinovirus 2017 Disc degeneration and spondylosis C4-5 and C5-6.  Copd, parasthesia,  GERD, Acute respiratory failure with hypoxemia due to an acute exacerbation of IPF from a rhinovirus infection; pt reports coughing with water to md ?Type of Study: Bedside Swallow Evaluation ?Diet Prior to this Study: Regular;Thin liquids ?Temperature Spikes Noted: No ?Respiratory Status: Nasal cannula (HFNC) ?History of Recent Intubation: No ?Behavior/Cognition: Alert;Cooperative;Pleasant mood ?Oral Cavity Assessment: Within Functional Limits ?Oral Care Completed by SLP: No ?Oral Cavity - Dentition: Adequate natural dentition ?Vision: Functional for self-feeding ?Self-Feeding Abilities: Able to feed self ?Patient Positioning: Upright in bed ?Baseline Vocal Quality: Other (comment) (mildly decreased phonation strength/hoarse due to his rhinovirus, he denies this to occur at baseline) ?Volitional Cough: Strong ?Volitional Swallow: Able to elicit  ?  ?Oral/Motor/Sensory Function Overall Oral  Motor/Sensory Function: Other (comment) (pt is s/p uvulectomy approx 15 years ago, denies ongoing dysphagia after acute phase)   ?Ice Chips Ice chips: Not tested   ?Thin Liquid Thin Liquid: Within functional limits ?Presentation: Cup;Self Fed ?Other Comments: pt easily passed 3 ounce Yale  ?  ?Nectar Thick Nectar Thick Liquid: Not tested   ?Honey Thick Honey Thick Liquid: Not tested   ?Puree Puree: Not tested   ?Solid ? ? ?  Solid: Within functional limits ?Presentation: Self Fed  ? ?  ? ?Macario Golds ?11/08/2021,1:12 PM ? ? ? ?

## 2021-11-08 NOTE — Progress Notes (Signed)
? ?NAME:  Bryan Tyler, MRN:  [id number], DOB:  10/23/1945, LOS: 3 ?ADMISSION DATE:  11/04/2021, CONSULTATION DATE:  3/28 ?REFERRING MD:  Lingamgunta, [place], CHIEF COMPLAINT:  Dyspnea  ? ?History of Present Illness:  ?This is a pleasant 76 year old male with a past medical history significant for idiopathic pulmonary fibrosis who has been followed by my partners in the pulmonary clinic for the same for several years who presents to our facility on transfer from [facility] in [facility] with a diagnosis of acute respiratory failure with hypoxemia due to an IPF exacerbation.  The patient has previously been followed by Dr. Vaughan Browner and has been enrolled in a clinical trial since January of this year entitled:  ?FGCL-3019-095 (FibroGen Study) is a Phase 3, randomized, double-blind, placebo-controlled multicenter international study to evaluate the efficacy and safety of 30 mg/kg IV infusions of pamrevlumab administered every 3 weeks for 52 weeks as compared to placebo in subjects with Idiopathic Pulmonary Fibrosis. Primary end point is: change in FVC from baseline at week 52. ZEPHYRUS II STUDY. ? ?The patient tells me that in prior years he was intolerant of antifibrotic agents for pulmonary fibrosis.  However, he has remained active, and says that he was not using oxygen prior to admission.  Approximately 1 week prior to admission he said that after going to the gym he felt "a little different" but did not complain of shortness of breath or cough.  On Friday, March 23 he traveled to [facility] where shortly after arrival he began to become very short of breath with associated headache and dry cough.  He denies fevers chills, rash, nausea, vomiting, chest pain or sinus complaint.  He is unaware of any sick contacts.  After 2 days of dyspnea his son-in-law took him to the emergency department in [place] where he was admitted for severe acute respiratory failure with hypoxemia by the  hospitalist service.  Pulmonary was consulted at [facility], specifically Drs. Posey Pronto and [place].  The patient had a work-up in [place] which included a respiratory viral panel which was negative for COVID and flu however an extended panel showed rhinovirus infection.  Lab work showed a BNP which is mildly elevated at 136 troponin was slightly elevated.  He had a CT angiogram of his chest which showed no pulmonary embolism but he did have severe pulmonary fibrosis and bilateral peripheral opacification.  He was treated with IV steroids, azithromycin and cefepime.  An echocardiogram was performed.  On admission his oxygen needs were 6 L of nasal cannula (March 25) but he was moved to the intensive care unit for progressive worsening of oxygenation and required heated high flow.  Request for transfer to [name] was made on March 27 but there were no beds available so he was moved today March 28.  By the time of being moved he was transferred on Optiflow (heated high flow) at 40 L/min and 60% FiO2. ? ?He had a blood gas on March 26 at 1 AM: 7.43/33.6/79 point 09/02/1988 7% on 36% FiO2. ? ?Other lab shows a mycoplasma IgM MRSA swab negative basic metabolic panel is normal troponin slightly 77, she is normal with the exception of a slightly elevated [name].  This is 1.2/dL ? ?The patient states that he currently feels some short of breath but in general he is feeling better since he came to the hospital. ? ?He denies leg swelling or chest pain.   ? ?Pertinent  Medical History  ?Idiopathic pulmonary fibrosis ?  Adenocarcinoma of the prostate ?Thoracic aortic aneurysm ?Anxiety ?Depression ?Diverticulosis ?GERD ?Hyperlipidemia ?Hypertension ?Hypothyroidism ?Obstructive sleep apnea, not on CPAP ?Osteoporosis ? ?Significant Hospital Events: ?Including procedures, antibiotic start and stop dates in addition to other pertinent events   ?March 25, admitted to [facility] in [place], pulmonary consulted, treated with  steroids, ceftriaxone, azithromycin for an IPF flare, noted to be rhinovirus positive.  COVID and flu testing negative ?March 26 moved to the intensive care unit for heated high flow ?March 28 transferred to [facility] long hospital ? ?Interim History / Subjective:  ? ?Remains critically ill, on high flow oxygen ?On 20 L / 60% ?Frequent coughing spells, Robitussin seems to help the best ? ?Objective   ?Blood pressure (!) 165/91, pulse 90, temperature 97.7 ?F (36.5 ?C), temperature source Oral, resp. rate (!) 31, weight 82.6 kg, SpO2 90 %. ?   ?FiO2 (%):  [20 %-60 %] 20 %  ? ?Intake/Output Summary (Last 24 hours) at 11/08/2021 1000 ?Last data filed at 11/08/2021 0755 ?Gross per 24 hour  ?Intake 340 ml  ?Output 2125 ml  ?Net -1785 ml  ? ? ?Filed Weights  ? 11/08/21 0500  ?Weight: 82.6 kg  ? ? ?Examination:  ?General:  Elderly male resting comfortably in bed , on high flow oxygen 60% / 20 L ?HENT: [place]/AT, PERRL, no JVD ?PULM: Mild accessory muscle use, bibasal one third dry crackles, ?CV: S1-S2 regular, no murmur ?GI: Soft, non-tender, non-distended ?MSK: No deformity. No edema. Moving all extremities 5/5 strength ?Neuro: Alert, oriented, non-focal. ? ?Chest x-ray 12/28 independently reviewed shows bilateral interstitial infiltrates ?Resolved Hospital Problem list   ? ? ?Assessment & Plan:  ?Acute respiratory failure with hypoxemia due to an IPF flare due to acute rhinovirus infection ?On clinical trial/research protocol for idiopathic pulmonary fibrosis investigational agent (FGCL-3019-095 (FibroGen Study)) ?- HFNC titrated to sat goal 85 to 95% at rest -60% 20 L currently ?- Tolerate periods of hypoxemia. Sat goal with movement ideally above 75% ?- Out of bed to chair as able ?- Incentive spirometry is important, use every hour ?- Solu-Medrol 80 mg IV twice daily until hypoxia improves ?- Continue ceftriaxone and azithromycin x 5ds ?-Using Robitussin and Tessalon for cough ?-Chest x-ray tomorrow ? ? ?Hypothyroidism: ?-  Synthroid ? ?Hyperglycemia due to Solu-Medrol ?- Sliding scale insulin and before every meal and nightly Accu-Cheks ? ?Anxiety ?depression ?- Clonazepam as per home dosing: 0.5 twice daily as needed anxiety, 1.5 mg nightly insomnia ?- Effexor ? ? ?Dementia: ?Aricept ? ?BPH/history of prostate cancer ?Flomax ? ?GERD: ?Protonix ? ? ?Unclear if this is rhinovirus related or IPF flare, also treating for bacterial pneumonia ? ?Best Practice (right click and "Reselect all SmartList Selections" daily)  ? ?Diet/type: Regular consistency (see orders) ?DVT prophylaxis: LMWH ?GI prophylaxis: PPI ?Lines: N/A ?Foley:  N/A ?Code Status:  DNR ?Last date of multidisciplinary goals of care discussion [3/28] ? ?Kara Mead MD. Shade Flood. ?[facility] ?Pager : [phone] ? ?If no response to pager , please call [phone] until 7 pm ?After 7:00 pm call Elink  [phone]  ? ? ?11/08/2021 10:00 AM ? ? ? ? ? ? ? ?

## 2021-11-09 ENCOUNTER — Inpatient Hospital Stay (HOSPITAL_COMMUNITY): Payer: Medicare Other

## 2021-11-09 DIAGNOSIS — J9601 Acute respiratory failure with hypoxia: Secondary | ICD-10-CM | POA: Diagnosis not present

## 2021-11-09 DIAGNOSIS — J84112 Idiopathic pulmonary fibrosis: Secondary | ICD-10-CM | POA: Diagnosis not present

## 2021-11-09 LAB — CBC
HCT: 37 % — ABNORMAL LOW (ref 39.0–52.0)
Hemoglobin: 11.6 g/dL — ABNORMAL LOW (ref 13.0–17.0)
MCH: 27.4 pg (ref 26.0–34.0)
MCHC: 31.4 g/dL (ref 30.0–36.0)
MCV: 87.3 fL (ref 80.0–100.0)
Platelets: 274 10*3/uL (ref 150–400)
RBC: 4.24 MIL/uL (ref 4.22–5.81)
RDW: 15.4 % (ref 11.5–15.5)
WBC: 16.3 10*3/uL — ABNORMAL HIGH (ref 4.0–10.5)
nRBC: 0 % (ref 0.0–0.2)

## 2021-11-09 LAB — BASIC METABOLIC PANEL
Anion gap: 7 (ref 5–15)
BUN: 28 mg/dL — ABNORMAL HIGH (ref 8–23)
CO2: 29 mmol/L (ref 22–32)
Calcium: 8.2 mg/dL — ABNORMAL LOW (ref 8.9–10.3)
Chloride: 100 mmol/L (ref 98–111)
Creatinine, Ser: 0.7 mg/dL (ref 0.61–1.24)
GFR, Estimated: >60 mL/min (ref >60)
Glucose, Bld: 139 mg/dL — ABNORMAL HIGH (ref 70–99)
Potassium: 4.3 mmol/L (ref 3.5–5.1)
Sodium: 136 mmol/L (ref 135–145)

## 2021-11-09 LAB — GLUCOSE, CAPILLARY
Glucose-Capillary: 98 mg/dL (ref 70–99)
Glucose-Capillary: 122 mg/dL — ABNORMAL HIGH (ref 70–99)
Glucose-Capillary: 140 mg/dL — ABNORMAL HIGH (ref 70–99)
Glucose-Capillary: 128 mg/dL — ABNORMAL HIGH (ref 70–99)

## 2021-11-09 NOTE — Progress Notes (Signed)
? ?NAME:  Bryan Tyler, MRN:  [id number], DOB:  [birth year], LOS: 4 ?ADMISSION DATE:  11/04/2021, CONSULTATION DATE:  3/28 ?REFERRING MD:  Lingamgunta, [place], CHIEF COMPLAINT:  Dyspnea  ? ?History of Present Illness:  ?This is a pleasant 76 year old male with a past medical history significant for idiopathic pulmonary fibrosis who has been followed by my partners in the pulmonary clinic for the same for several years who presents to our facility on transfer from [facility] in [facility] with a diagnosis of acute respiratory failure with hypoxemia due to an IPF exacerbation.  The patient has previously been followed by Dr. Vaughan Browner and has been enrolled in a clinical trial since January of this year entitled:  ?FGCL-3019-095 (FibroGen Study) is a Phase 3, randomized, double-blind, placebo-controlled multicenter international study to evaluate the efficacy and safety of 30 mg/kg IV infusions of pamrevlumab administered every 3 weeks for 52 weeks as compared to placebo in subjects with Idiopathic Pulmonary Fibrosis. Primary end point is: change in FVC from baseline at week 52. ZEPHYRUS II STUDY. ? ?The patient tells me that in prior years he was intolerant of antifibrotic agents for pulmonary fibrosis.  However, he has remained active, and says that he was not using oxygen prior to admission.  Approximately 1 week prior to admission he said that after going to the gym he felt "a little different" but did not complain of shortness of breath or cough.  On Friday, March 23 he traveled to [facility] where shortly after arrival he began to become very short of breath with associated headache and dry cough.  He denies fevers chills, rash, nausea, vomiting, chest pain or sinus complaint.  He is unaware of any sick contacts.  After 2 days of dyspnea his son-in-law took him to the emergency department in [place] where he was admitted for severe acute respiratory failure with hypoxemia by the  hospitalist service.  Pulmonary was consulted at [facility], specifically Drs. Posey Pronto and [place].  The patient had a work-up in [place] which included a respiratory viral panel which was negative for COVID and flu however an extended panel showed rhinovirus infection.  Lab work showed a BNP which is mildly elevated at 136 troponin was slightly elevated.  He had a CT angiogram of his chest which showed no pulmonary embolism but he did have severe pulmonary fibrosis and bilateral peripheral opacification.  He was treated with IV steroids, azithromycin and cefepime.  An echocardiogram was performed.  On admission his oxygen needs were 6 L of nasal cannula (March 25) but he was moved to the intensive care unit for progressive worsening of oxygenation and required heated high flow.  Request for transfer to [name] was made on March 27 but there were no beds available so he was moved today March 28.  By the time of being moved he was transferred on Optiflow (heated high flow) at 40 L/min and 60% FiO2. ? ?He had a blood gas on March 26 at 1 AM: 7.43/33.6/79 point 09/02/1988 7% on 36% FiO2. ? ?Other lab shows a mycoplasma IgM MRSA swab negative basic metabolic panel is normal troponin slightly 77, she is normal with the exception of a slightly elevated [name].  This is 1.2/dL ? ? ? ?Pertinent  Medical History  ?Idiopathic pulmonary fibrosis ?Adenocarcinoma of the prostate ?Thoracic aortic aneurysm ?Anxiety ?Depression ?Diverticulosis ?GERD ?Hyperlipidemia ?Hypertension ?Hypothyroidism ?Obstructive sleep apnea, not on CPAP ?Osteoporosis ? ?Significant Hospital Events: ?Including procedures, antibiotic start and stop dates in  addition to other pertinent events   ?March 25, admitted to [facility] in [place], pulmonary consulted, treated with steroids, ceftriaxone, azithromycin for an IPF flare, noted to be rhinovirus positive.  COVID and flu testing negative ?March 26 moved to the intensive care unit for heated high  flow ?March 28 transferred to [facility] long hospital ?3/31 On 20 L / 60% ? ?Interim History / Subjective:  ? ?Remains critically ill, on high flow oxygen ?On 55% / 25 L ?Coughing spells have decreased ?Desaturates intermittently with exertion ? ? ?Objective   ?Blood pressure (!) 198/97, pulse 95, temperature 98 ?F (36.7 ?C), temperature source Axillary, resp. rate (!) 38, weight 82.4 kg, SpO2 91 %. ?   ?FiO2 (%):  [45 %-55 %] 50 %  ? ?Intake/Output Summary (Last 24 hours) at 11/09/2021 1558 ?Last data filed at 11/09/2021 1425 ?Gross per 24 hour  ?Intake 490 ml  ?Output 1725 ml  ?Net -1235 ml  ? ? ?Filed Weights  ? 11/08/21 0500 11/09/21 0500  ?Weight: 82.6 kg 82.4 kg  ? ? ?Examination:  ?General:  Elderly male resting comfortably in bed , on high flow oxygen  ?HENT: [place]/AT, PERRL, no JVD ?PULM: Mild accessory muscle use ?Bibasal one third dry crackles, ?CV: S1-S2 regular, no murmur ?GI: Soft, non-tender, non-distended ?MSK: No deformity. No edema. Moving all extremities 5/5 strength ?Neuro: Alert, oriented, non-focal. ? ?Chest x-ray 4/1 independently reviewed shows unchanged bilateral interstitial infiltrates ? ? ?Resolved Hospital Problem list   ? ? ?Assessment & Plan:  ?Acute respiratory failure with hypoxemia due to an IPF flare due to acute rhinovirus infection ?On clinical trial/research protocol for idiopathic pulmonary fibrosis investigational agent (FGCL-3019-095 (FibroGen Study)) ?- HFNC titrated to sat goal 85 to 95% at rest -50% 30 L currently ?-Gets hypoxic with minimal exertion and desaturates but can accept 80% as long as transient ?- Out of bed to chair as able ?- Incentive spirometry is important, use every hour ?- Solu-Medrol 80 mg IV twice daily until hypoxia improves ?- Continue ceftriaxone x 5ds ?-Using Robitussin and Tessalon for cough ? ? ?Hypothyroidism: ?- Synthroid ? ?Hyperglycemia due to Solu-Medrol ?- Sliding scale insulin and before every meal and nightly Accu-Cheks ? ?Anxiety ?depression ?-  Clonazepam as per home dosing: 0.5 twice daily as needed anxiety, 1.5 mg nightly insomnia ?- Effexor ? ? ?Dementia: ?Aricept ? ?BPH/history of prostate cancer ?Flomax ? ?GERD: ?Protonix ? ? ?Unclear if this is rhinovirus related or IPF flare, also treating for bacterial pneumonia ?Prognosis poor if IPF flare and hypoxia may not improve ? ?Best Practice (right click and "Reselect all SmartList Selections" daily)  ? ?Diet/type: Regular consistency (see orders) ?DVT prophylaxis: LMWH ?GI prophylaxis: PPI ?Lines: N/A ?Foley:  N/A ?Code Status:  DNR ?Last date of multidisciplinary goals of care discussion [3/28] ? ?Kara Mead MD. Shade Flood. ?[facility] ?Pager : [phone] ? ?If no response to pager , please call [phone] until 7 pm ?After 7:00 pm call Elink  [phone]  ? ? ?11/09/2021 3:58 PM ? ? ? ? ? ? ? ?

## 2021-11-09 DEATH — deceased

## 2021-11-10 DIAGNOSIS — J9601 Acute respiratory failure with hypoxia: Secondary | ICD-10-CM | POA: Diagnosis not present

## 2021-11-10 DIAGNOSIS — J84112 Idiopathic pulmonary fibrosis: Secondary | ICD-10-CM | POA: Diagnosis not present

## 2021-11-10 LAB — GLUCOSE, CAPILLARY
Glucose-Capillary: 93 mg/dL (ref 70–99)
Glucose-Capillary: 191 mg/dL — ABNORMAL HIGH (ref 70–99)
Glucose-Capillary: 147 mg/dL — ABNORMAL HIGH (ref 70–99)
Glucose-Capillary: 163 mg/dL — ABNORMAL HIGH (ref 70–99)
Glucose-Capillary: 147 mg/dL — ABNORMAL HIGH (ref 70–99)

## 2021-11-10 NOTE — Progress Notes (Signed)
? ?NAME:  Bryan Tyler, MRN:  [id number], DOB:  1946/06/25, LOS: 5 ?ADMISSION DATE:  10/15/2021, CONSULTATION DATE:  3/28 ?REFERRING MD:  Lingamgunta, [place], CHIEF COMPLAINT:  Dyspnea  ? ?History of Present Illness:  ?This is a pleasant 76 year old male with a past medical history significant for idiopathic pulmonary fibrosis who has been followed by my partners in the pulmonary clinic for the same for several years who presents to our facility on transfer from [facility] in [facility] with a diagnosis of acute respiratory failure with hypoxemia due to an IPF exacerbation.  The patient has previously been followed by Dr. Vaughan Browner and has been enrolled in a clinical trial since January of this year entitled:  ?FGCL-3019-095 (FibroGen Study) is a Phase 3, randomized, double-blind, placebo-controlled multicenter international study to evaluate the efficacy and safety of 30 mg/kg IV infusions of pamrevlumab administered every 3 weeks for 52 weeks as compared to placebo in subjects with Idiopathic Pulmonary Fibrosis. Primary end point is: change in FVC from baseline at week 52. ZEPHYRUS II STUDY. ? ?The patient tells me that in prior years he was intolerant of antifibrotic agents for pulmonary fibrosis.  However, he has remained active, and says that he was not using oxygen prior to admission.  Approximately 1 week prior to admission he said that after going to the gym he felt "a little different" but did not complain of shortness of breath or cough.  On Friday, March 23 he traveled to [facility] where shortly after arrival he began to become very short of breath with associated headache and dry cough.  He denies fevers chills, rash, nausea, vomiting, chest pain or sinus complaint.  He is unaware of any sick contacts.  After 2 days of dyspnea his son-in-law took him to the emergency department in [place] where he was admitted for severe acute respiratory failure with hypoxemia by the  hospitalist service.  Pulmonary was consulted at [facility], specifically Drs. Posey Pronto and [place].  The patient had a work-up in [place] which included a respiratory viral panel which was negative for COVID and flu however an extended panel showed rhinovirus infection.  Lab work showed a BNP which is mildly elevated at 136 troponin was slightly elevated.  He had a CT angiogram of his chest which showed no pulmonary embolism but he did have severe pulmonary fibrosis and bilateral peripheral opacification.  He was treated with IV steroids, azithromycin and cefepime.  An echocardiogram was performed.  On admission his oxygen needs were 6 L of nasal cannula (March 25) but he was moved to the intensive care unit for progressive worsening of oxygenation and required heated high flow.  Request for transfer to [name] was made on March 27 but there were no beds available so he was moved today March 28.  By the time of being moved he was transferred on Optiflow (heated high flow) at 40 L/min and 60% FiO2. ? ?Other lab shows a mycoplasma IgM MRSA swab negative basic metabolic panel is normal troponin slightly 77, she is normal with the exception of a slightly elevated [name].  This is 1.2/dL ? ? ? ?Pertinent  Medical History  ?Idiopathic pulmonary fibrosis ?Adenocarcinoma of the prostate ?Thoracic aortic aneurysm ?Anxiety ?Depression ?Diverticulosis ?GERD ?Hyperlipidemia ?Hypertension ?Hypothyroidism ?Obstructive sleep apnea, not on CPAP ?Osteoporosis ? ?Significant Hospital Events: ?Including procedures, antibiotic start and stop dates in addition to other pertinent events   ?March 25, admitted to [facility] in [place], pulmonary consulted, treated with  steroids, ceftriaxone, azithromycin for an IPF flare, noted to be rhinovirus positive.  COVID and flu testing negative ?March 26 moved to the intensive care unit for heated high flow ?March 28 transferred to [facility] long hospital ?3/31 On 20 L / 60% ? ?Interim  History / Subjective:  ? ?Chronic, critically ill, on high flow oxygen ?On 50% / 30 L ?He continues to desaturate with minimal exertion ? ? ?Objective   ?Blood pressure (!) 158/79, pulse 96, temperature 97.7 ?F (36.5 ?C), temperature source Axillary, resp. rate (!) 38, weight 81.3 kg, SpO2 94 %. ?   ?FiO2 (%):  [50 %] 50 %  ? ?Intake/Output Summary (Last 24 hours) at 11/10/2021 1524 ?Last data filed at 11/10/2021 1400 ?Gross per 24 hour  ?Intake --  ?Output 1130 ml  ?Net -1130 ml  ? ? ?Filed Weights  ? 11/08/21 0500 11/09/21 0500 11/10/21 0500  ?Weight: 82.6 kg 82.4 kg 81.3 kg  ? ? ?Examination:  ?General:  Elderly male resting comfortably in bed , on high flow oxygen  ?HENT: [place]/AT, PERRL, no JVD ?PULM: Bilateral scattered dry crackles, basal one third, mild accessory muscle use ? ?CV: S1-S2 regular, no murmur ?GI: Soft, non-tender, non-distended ?MSK: No deformity. No edema. Moving all extremities 5/5 strength ?Neuro: Alert, interactive, nonfocal ? ?Chest x-ray 4/1 independently reviewed shows unchanged bilateral interstitial infiltrates ?Labs 4/1 showed normal electrolytes, mild leukocytosis ? ?Resolved Hospital Problem list   ? ? ?Assessment & Plan:  ?Acute respiratory failure with hypoxemia due to an IPF flare due to acute rhinovirus infection ?On clinical trial/research protocol for idiopathic pulmonary fibrosis investigational agent (FGCL-3019-095 (FibroGen Study)) ?- HFNC titrated to sat goal 85 to 95% at rest -50% 30 L currently ?-Gets hypoxic with minimal exertion and desaturates but can accept 80% as long as transient ?- Out of bed to chair as able ?- Incentive spirometry is important, use every hour ?- Solu-Medrol 80 mg IV twice daily until hypoxia improves ?- Continue ceftriaxone x 5ds ?-Using Robitussin and Tessalon for cough ? ? ?Hypothyroidism: ?- Synthroid ? ?Hyperglycemia due to Solu-Medrol ?- Sliding scale insulin and before every meal and nightly Accu-Cheks ? ?Anxiety ?depression ?- Clonazepam as per  home dosing: 0.5 twice daily as needed anxiety, 1.5 mg nightly insomnia ?- Effexor ? ? ?Dementia: ?Aricept ? ?BPH/history of prostate cancer ?Flomax ? ?GERD: ?Protonix ? ?This is appearing more like IPF flare rather than rhinovirus related infection, time will tell where he settles with oxygen requirements, cautioned him that this may take a few more days to weeks to clarify.  Offered him LTAC option if ?He needs high flow oxygen but he prefers to go home ? ?Best Practice (right click and "Reselect all SmartList Selections" daily)  ? ?Diet/type: Regular consistency (see orders) ?DVT prophylaxis: LMWH ?GI prophylaxis: PPI ?Lines: N/A ?Foley:  N/A ?Code Status:  DNR ?Last date of multidisciplinary goals of care discussion [3/28] ? ?Kara Mead MD. Shade Flood. ?[facility] ?Pager : [phone] ? ?If no response to pager , please call [phone] until 7 pm ?After 7:00 pm call Elink  [phone]  ? ? ?11/10/2021 3:24 PM ? ? ? ? ? ? ? ?

## 2021-11-11 DIAGNOSIS — J84112 Idiopathic pulmonary fibrosis: Secondary | ICD-10-CM | POA: Diagnosis not present

## 2021-11-11 LAB — GLUCOSE, CAPILLARY
Glucose-Capillary: 119 mg/dL — ABNORMAL HIGH (ref 70–99)
Glucose-Capillary: 108 mg/dL — ABNORMAL HIGH (ref 70–99)
Glucose-Capillary: 122 mg/dL — ABNORMAL HIGH (ref 70–99)
Glucose-Capillary: 154 mg/dL — ABNORMAL HIGH (ref 70–99)

## 2021-11-11 LAB — PROCALCITONIN: Procalcitonin: <0.1 ng/mL

## 2021-11-11 LAB — SEDIMENTATION RATE: Sed Rate: 9 mm/hr (ref 0–16)

## 2021-11-11 MED ORDER — SODIUM CHLORIDE 0.9 % IV SOLN
500.0000 mg | INTRAVENOUS | Status: DC
  Administered 2021-11-11: 500 mg via INTRAVENOUS
  Filled 2021-11-11: qty 5

## 2021-11-11 MED ORDER — SULFAMETHOXAZOLE-TRIMETHOPRIM 800-160 MG PO TABS
1.0000 | ORAL_TABLET | ORAL | Status: DC
  Administered 2021-11-11 – 2021-11-20 (×5): 1 via ORAL
  Filled 2021-11-11 (×5): qty 1

## 2021-11-11 NOTE — Progress Notes (Signed)
? ?NAME:  Bryan Tyler, MRN:  [id number], DOB:  [birth year], LOS: 6 ?ADMISSION DATE:  11/04/2021, CONSULTATION DATE:  3/28 ?REFERRING MD:  Lingamgunta, [facility], CHIEF COMPLAINT:  Dyspnea  ? ?History of Present Illness:  ?This is a pleasant 76 year old male with a past medical history significant for idiopathic pulmonary fibrosis who has been followed by my partners in the pulmonary clinic for the same for several years who presents to our facility on transfer from [facility] in [facility] with a diagnosis of acute respiratory failure with hypoxemia due to an IPF exacerbation.  The patient has previously been followed by Dr. Vaughan Browner and has been enrolled in a clinical trial since January of this year entitled:  ?FGCL-3019-095 (FibroGen Study) is a Phase 3, randomized, double-blind, placebo-controlled multicenter international study to evaluate the efficacy and safety of 30 mg/kg IV infusions of pamrevlumab administered every 3 weeks for 52 weeks as compared to placebo in subjects with Idiopathic Pulmonary Fibrosis. Primary end point is: change in FVC from baseline at week 52. ZEPHYRUS II STUDY. ? ?The patient tells me that in prior years he was intolerant of antifibrotic agents for pulmonary fibrosis.  However, he has remained active, and says that he was not using oxygen prior to admission.  Approximately 1 week prior to admission he said that after going to the gym he felt "a little different" but did not complain of shortness of breath or cough.  On Friday, March 23 he traveled to [facility] where shortly after arrival he began to become very short of breath with associated headache and dry cough.  He denies fevers chills, rash, nausea, vomiting, chest pain or sinus complaint.  He is unaware of any sick contacts.  After 2 days of dyspnea his son-in-law took him to the emergency department in [place] where he was admitted for severe acute respiratory failure with hypoxemia by the  hospitalist service.  Pulmonary was consulted at [facility], specifically Drs. Posey Pronto and Beacon.  The patient had a work-up in [place] which included a respiratory viral panel which was negative for COVID and flu however an extended panel showed rhinovirus infection.  Lab work showed a BNP which is mildly elevated at 136 troponin was slightly elevated.  He had a CT angiogram of his chest which showed no pulmonary embolism but he did have severe pulmonary fibrosis and bilateral peripheral opacification.  He was treated with IV steroids, azithromycin and cefepime.  An echocardiogram was performed.  On admission his oxygen needs were 6 L of nasal cannula (March 25) but he was moved to the intensive care unit for progressive worsening of oxygenation and required heated high flow.  Request for transfer to [name] was made on March 27 but there were no beds available so he was moved today March 28.  By the time of being moved he was transferred on Optiflow (heated high flow) at 40 L/min and 60% FiO2. ? ?Other lab shows a mycoplasma IgM MRSA swab negative basic metabolic panel is normal troponin slightly 77, she is normal with the exception of a slightly elevated [name].  This is 1.2/dL ? ?Pertinent  Medical History  ?Idiopathic pulmonary fibrosis ?Adenocarcinoma of the prostate ?Thoracic aortic aneurysm ?Anxiety ?Depression ?Diverticulosis ?GERD ?Hyperlipidemia ?Hypertension ?Hypothyroidism ?Obstructive sleep apnea, not on CPAP ?Osteoporosis ? ?Significant Hospital Events: ?Including procedures, antibiotic start and stop dates in addition to other pertinent events   ?March 25, admitted to [facility] in [employer], pulmonary consulted, treated with steroids, ceftriaxone,  azithromycin for an IPF flare, noted to be rhinovirus positive.  COVID and flu testing negative ?March 26 moved to the intensive care unit for heated high flow ?March 28 transferred to [facility] long hospital ?3/31 On 20 L / 60% ?4/2 HFNC 30L/ 50%,  continues to desaturate w/minimal exertion ? ?3/28 ceftriaxone > 4/1 ?3/28 azithro > 3/30 ? ?Interim History / Subjective:  ?No complaints ?Remains on HHFNC 30L/ 55% and NRB as needed with exertion  ?Afebrile  ? ?Objective   ?Blood pressure 139/89, pulse 60, temperature 97.7 ?F (36.5 ?C), temperature source Oral, resp. rate (!) 28, weight 82.1 kg, SpO2 (!) 88 %. ?   ?FiO2 (%):  [45 %-55 %] 55 %  ? ?Intake/Output Summary (Last 24 hours) at 11/11/2021 1026 ?Last data filed at 11/11/2021 0600 ?Gross per 24 hour  ?Intake --  ?Output 880 ml  ?Net -880 ml  ? ?Filed Weights  ? 11/09/21 0500 11/10/21 0500 11/11/21 0354  ?Weight: 82.4 kg 81.3 kg 82.1 kg  ? ? ?Examination:  ?General:  Very pleasant elderly male sitting upright in bedside recliner in moderate distress after transferring from [facility] to recliner ?HEENT: MM pink/moist ?Neuro:  Aox 3, MAE ?CV: rr, NSR, no murmur ?PULM:  labored but improving with rest, clear anteriorly, bibasilar rales, squeak in RLL on HHFNC and NRB, mostly dry cough- produced one sputum- light yellow ?GI: soft, bs+, NT, voids ?Extremities: warm/dry, no LE edema  ?Skin: no rashes  ? ?No labs 4/3 ? ?Resolved Hospital Problem list   ? ? ?Assessment & Plan:  ?Acute respiratory failure with hypoxemia due to an IPF flare due to acute rhinovirus infection ?On clinical trial/research protocol for idiopathic pulmonary fibrosis investigational agent ([id number] (FibroGen Study)) ?- completed abx 4/1 ?- HHFNC titrated to sat goal 85 to 95% at rest with NRB prn with exertion ?- still desaturates with minimal exertion ?- still no major improvements despite steroids which is a poor prognostic indicator.  Will check PCT, start empiric abx to rule out any contributing factors  ?- consult PT, ongoing aggressive pulm hygiene> PT, IS, mobilize ?- continue Solu-Medrol 80 mg IV twice daily until hypoxia improves w/ delirium precautions ?- adding PJP ppx ?- droplet precautions given rhinovirus  ?- prn Robitussin and  Tessalon for cough, flonase, prn albuterol  ?- cleared by SLP- 3/31, mild aspiration risk  ?- euvolemic on exam, no need for diuresis- wts stable ?- might need LTAC eventually but prefers home  ?- remains DNR/ DNI  ? ?Hypothyroidism: ?- cont Synthroid ? ?Hyperglycemia due to Solu-Medrol ?- SSI AC/ HS moderate  ? ?Anxiety ?depression ?- prn clonazepam during day and q HS for insomnia  ?- cont Effexor ? ?Dementia: ?- cont Aricept ? ?BPH/history of prostate cancer ?- cont Flomax ? ?GERD: ?- cont Protonix ? ?HTN, HLD ?- cont norvasc and lipitor  ? ? ?Best Practice (right click and "Reselect all SmartList Selections" daily)  ? ?Diet/type: Regular consistency (see orders) ?DVT prophylaxis: LMWH ?GI prophylaxis: PPI ?Lines: N/A ?Foley:  N/A ?Code Status:  DNR ?Last date of multidisciplinary goals of care discussion [3/28] ?Daughter who lives in [place] asking for update from provider.  Patient consented to speak/ give info.   Pending updated.   ? ? ? ?Kennieth Rad, ACNP ? Pulmonary & Critical Care ?11/11/2021, 10:26 AM ? ?See Amion for pager ?If no response to pager, please call PCCM consult pager ?After 7:00 pm call Elink   ? ? ? ? ? ? ? ? ?

## 2021-11-11 NOTE — Progress Notes (Signed)
Spoke with daughter at length ?She would like updates if any major changes ?Updated her to best of my ability ?

## 2021-11-12 ENCOUNTER — Other Ambulatory Visit: Payer: Self-pay

## 2021-11-12 ENCOUNTER — Encounter (HOSPITAL_COMMUNITY): Payer: Self-pay | Admitting: Pulmonary Disease

## 2021-11-12 DIAGNOSIS — J84112 Idiopathic pulmonary fibrosis: Secondary | ICD-10-CM | POA: Diagnosis not present

## 2021-11-12 LAB — GLUCOSE, CAPILLARY
Glucose-Capillary: 103 mg/dL — ABNORMAL HIGH (ref 70–99)
Glucose-Capillary: 112 mg/dL — ABNORMAL HIGH (ref 70–99)
Glucose-Capillary: 163 mg/dL — ABNORMAL HIGH (ref 70–99)
Glucose-Capillary: 149 mg/dL — ABNORMAL HIGH (ref 70–99)

## 2021-11-12 LAB — CBC
HCT: 39.1 % (ref 39.0–52.0)
Hemoglobin: 12.1 g/dL — ABNORMAL LOW (ref 13.0–17.0)
MCH: 27.3 pg (ref 26.0–34.0)
MCHC: 30.9 g/dL (ref 30.0–36.0)
MCV: 88.1 fL (ref 80.0–100.0)
Platelets: 282 10*3/uL (ref 150–400)
RBC: 4.44 MIL/uL (ref 4.22–5.81)
RDW: 15.7 % — ABNORMAL HIGH (ref 11.5–15.5)
WBC: 17.1 10*3/uL — ABNORMAL HIGH (ref 4.0–10.5)
nRBC: 0 % (ref 0.0–0.2)

## 2021-11-12 LAB — BASIC METABOLIC PANEL
Anion gap: 3 — ABNORMAL LOW (ref 5–15)
BUN: 35 mg/dL — ABNORMAL HIGH (ref 8–23)
CO2: 32 mmol/L (ref 22–32)
Calcium: 8.3 mg/dL — ABNORMAL LOW (ref 8.9–10.3)
Chloride: 102 mmol/L (ref 98–111)
Creatinine, Ser: 0.78 mg/dL (ref 0.61–1.24)
GFR, Estimated: >60 mL/min (ref >60)
Glucose, Bld: 125 mg/dL — ABNORMAL HIGH (ref 70–99)
Potassium: 5 mmol/L (ref 3.5–5.1)
Sodium: 137 mmol/L (ref 135–145)

## 2021-11-12 MED ORDER — AZITHROMYCIN 250 MG PO TABS: 500.0000 mg | ORAL_TABLET | Freq: Every day | ORAL | Status: DC

## 2021-11-12 NOTE — Progress Notes (Signed)
? ?NAME:  Bryan Tyler, MRN:  [id number], DOB:  12-31-1945, LOS: 7 ?ADMISSION DATE:  10/29/2021, CONSULTATION DATE:  3/28 ?REFERRING MD:  Bryan Tyler, [facility], CHIEF COMPLAINT:  Dyspnea  ? ?History of Present Illness:  ?This is a pleasant 76 year old male with a past medical history significant for idiopathic pulmonary fibrosis who has been followed by my partners in the pulmonary clinic for the same for several years who presents to our facility on transfer from [facility] in [facility] with a diagnosis of acute respiratory failure with hypoxemia due to an IPF exacerbation.  The patient has previously been followed by Dr. Vaughan Tyler and has been enrolled in a clinical trial since January of this year entitled:  ?FGCL-3019-095 (FibroGen Study) is a Phase 3, randomized, double-blind, placebo-controlled multicenter international study to evaluate the efficacy and safety of 30 mg/kg IV infusions of pamrevlumab administered every 3 weeks for 52 weeks as compared to placebo in subjects with Idiopathic Pulmonary Fibrosis. Primary end point is: change in FVC from baseline at week 52. ZEPHYRUS II STUDY. ? ?The patient tells me that in prior years he was intolerant of antifibrotic agents for pulmonary fibrosis.  However, he has remained active, and says that he was not using oxygen prior to admission.  Approximately 1 week prior to admission he said that after going to the gym he felt "a little different" but did not complain of shortness of breath or cough.  On Friday, March 23 he traveled to [facility] where shortly after arrival he began to become very short of breath with associated headache and dry cough.  He denies fevers chills, rash, nausea, vomiting, chest pain or sinus complaint.  He is unaware of any sick contacts.  After 2 days of dyspnea his son-in-law took him to the emergency department in [place] where he was admitted for severe acute respiratory failure with hypoxemia by the  hospitalist service.  Pulmonary was consulted at [facility], specifically Drs. Bryan Tyler and Bryan Tyler.  The patient had a work-up in [place] which included a respiratory viral panel which was negative for COVID and flu however an extended panel showed rhinovirus infection.  Lab work showed a BNP which is mildly elevated at 136 troponin was slightly elevated.  He had a CT angiogram of his chest which showed no pulmonary embolism but he did have severe pulmonary fibrosis and bilateral peripheral opacification.  He was treated with IV steroids, azithromycin and cefepime.  An echocardiogram was performed.  On admission his oxygen needs were 6 L of nasal cannula (March 25) but he was moved to the intensive care unit for progressive worsening of oxygenation and required heated high flow.  Request for transfer to [facility] was made on March 27 but there were no beds available so he was moved today March 28.  By the time of being moved he was transferred on Optiflow (heated high flow) at 40 L/min and 60% FiO2. ? ?Other lab shows a mycoplasma IgM MRSA swab negative basic metabolic panel is normal troponin slightly 77, she is normal with the exception of a slightly elevated [name].  This is 1.2/dL ? ?Pertinent  Medical History  ?Idiopathic pulmonary fibrosis ?Adenocarcinoma of the prostate ?Thoracic aortic aneurysm ?Anxiety ?Depression ?Diverticulosis ?GERD ?Hyperlipidemia ?Hypertension ?Hypothyroidism ?Obstructive sleep apnea, not on CPAP ?Osteoporosis ? ?Significant Hospital Events: ?Including procedures, antibiotic start and stop dates in addition to other pertinent events   ?March 25, admitted to [facility] in [place], pulmonary consulted, treated with steroids, ceftriaxone,  azithromycin for an IPF flare, noted to be rhinovirus positive.  COVID and flu testing negative ?March 26 moved to the intensive care unit for heated high flow ?March 28 transferred to [facility] long hospital ?3/31 On 20 L / 60% ?4/2 HFNC 30L/ 50%,  continues to desaturate w/minimal exertion ? ?3/28 ceftriaxone > 4/1 ?3/28 azithro > 3/30 ? ?Interim History / Subjective:  ?No complaints, feels good today, good appetite ?Remains on HHFNC 60%, 30L and NRB w/activity ? ?Objective   ?Blood pressure 122/64, pulse 83, temperature 97.7 ?F (36.5 ?C), temperature source Oral, resp. rate (!) 25, weight 79.4 kg, SpO2 (!) 87 %. ?   ?FiO2 (%):  [60 %] 60 %  ? ?Intake/Output Summary (Last 24 hours) at 11/12/2021 1118 ?Last data filed at 11/12/2021 5427 ?Gross per 24 hour  ?Intake 1628.63 ml  ?Output 275 ml  ?Net 1353.63 ml  ? ?Filed Weights  ? 11/10/21 0500 11/11/21 0354 11/12/21 0500  ?Weight: 81.3 kg 82.1 kg 79.4 kg  ? ? ?Examination:  ?General:  Thin elderly male sitting upright in bed in NAD ?HEENT: MM pink/moist ?Neuro: Aox 3, MAE ?CV: rr, NSR ?PULM:  non labored, speaking full sentences, bibasilar rales  ?GI: soft, bs+ active, voids  ?Extremities: warm/dry, no edema, poor muscle mass  ? ?Labs reviewed: WBC 16.3 >17.1, PCT neg, sed rate 23> 9 ? ?Resolved Hospital Problem list   ? ? ?Assessment & Plan:  ?Acute respiratory failure with hypoxemia due to an IPF flare due to acute rhinovirus infection ?On clinical trial/research protocol for idiopathic pulmonary fibrosis investigational agent (FGCL-3019-095 (FibroGen Study)) ?- completed abx 4/1 ?- cleared by SLP- 3/31, mild aspiration risk  ?- HHFNC titrated to sat goal 85 to 95% at rest with NRB prn with exertion ?- still desaturates with minimal exertion, at times in the 70's but recovers  ?- still no major improvements in O2 requirements.  PCT was negative, will stop azithro.  Continue to clinically monitor for infectious symptoms.   ?- CXR in am.  Consider repeat chest CT if still not improving. ?- remains euvolemic ?- ongoing aggressive pulm hygiene- IS, flutter, PT, BD, mucinex  ?- cont solumedrol '80mg'$  BID ?- delirium precautions  ?- continue PJP ppx w/ bactrim ?- remains DNR/ DNI  ?- prn clonazepm for anxiety, and qHS for  sleep ?- patient considering short term stay at [place]  ? ?Hypothyroidism: ?- cont Synthroid ? ?Hyperglycemia due to Solu-Medrol ?- remains stable.  SSI AC/ HS moderate  ? ?Anxiety ?depression ?- prn clonazepam during day and q HS for insomnia  ?- cont Effexor ? ?Dementia: ?- cont Aricept ? ?BPH/history of prostate cancer ?- cont Flomax ? ?GERD: ?- cont Protonix ? ?HTN, HLD ?- cont norvasc and lipitor  ? ? ?Best Practice (right click and "Reselect all SmartList Selections" daily)  ? ?Diet/type: Regular consistency (see orders) ?DVT prophylaxis: LMWH ?GI prophylaxis: PPI ?Lines: N/A ?Foley:  N/A ?Code Status:  DNR ?Last date of multidisciplinary goals of care discussion [3/28] ?Daughter who lives in out of [place] updated by Dr. Tamala Julian 4/3.  Patient updated on plan of care.   ? ? ? ?Kennieth Rad, ACNP ?[place] [facility] ?11/12/2021, 11:18 AM ? ?See Amion for pager ?If no response to pager, please call PCCM consult pager ?After 7:00 pm call Elink   ? ? ? ? ? ? ? ? ?

## 2021-11-12 NOTE — Evaluation (Signed)
Physical Therapy Evaluation ?Patient Details ?Name: Bryan Tyler ?MRN: [id number] ?DOB: 11-23-1945 ?Today's Date: 11/12/2021 ? ?History of Present Illness ? 76 yo male admitted with idiopathic pulm fibrosis exacerbation, acute resp failure. Hx of IPF, OSA, osteoporosis  ?Clinical Impression ? On eval, pt was Supv for mobility. He stood from recliner and performed 5 reps of heel raises before O2 began dropping-dropped as low as 78% on HHFNC-30L, 60%FIO2. Several minutes before pt recovered to at least 89%. Once recovered pt was able to perform 10 reps of LAQs seated in reliner-O2 dropped to 87% on HHFNC. Discussed plan for continued PT during hospital stay. Pt is highly motivated to regain his PLOF. O2 90% EOS.    ?   ? ?Recommendations for follow up therapy are one component of a multi-disciplinary discharge planning process, led by the attending physician.  Recommendations may be updated based on patient status, additional functional criteria and insurance authorization. ? ?Follow Up Recommendations  (continuing to assess-hopefully home once medically stable) ? ?  ?Assistance Recommended at Discharge Frequent or constant Supervision/Assistance  ?Patient can return home with the following ? A little help with walking and/or transfers;A little help with bathing/dressing/bathroom;Assistance with cooking/housework;Assist for transportation;Help with stairs or ramp for entrance ? ?  ?Equipment Recommendations  (continuing to assess)  ?Recommendations for Other Services ?    ?  ?Functional Status Assessment Patient has had a recent decline in their functional status and demonstrates the ability to make significant improvements in function in a reasonable and predictable amount of time.  ? ?  ?Precautions / Restrictions Precautions ?Precaution Comments: monitor O2 ?Restrictions ?Weight Bearing Restrictions: No  ? ?  ? ?Mobility ? Bed Mobility ?  ?  ?  ?  ?  ?  ?  ?General bed mobility comments: oob in recliner ?   ? ?Transfers ?Overall transfer level: Needs assistance ?  ?Transfers: Sit to/from Stand ?Sit to Stand: Supervision ?  ?  ?  ?  ?  ?General transfer comment: Supv for safety. ?  ? ?Ambulation/Gait ?  ?  ?  ?  ?  ?  ?  ?General Gait Details: NT-not medically ready to attempt-HHFNC ? ?Stairs ?  ?  ?  ?  ?  ? ?Wheelchair Mobility ?  ? ?Modified Rankin (Stroke Patients Only) ?  ? ?  ? ?Balance   ?  ?  ?  ?  ?  ?  ?  ?  ?  ?  ?  ?  ?  ?  ?  ?  ?  ?  ?   ? ? ? ?Pertinent Vitals/Pain Pain Assessment ?Pain Assessment: No/denies pain  ? ? ?Home Living Family/patient expects to be discharged to:: Private residence ?Living Arrangements: Spouse/significant other ?Available Help at Discharge: Family ?Type of Home: House ?Home Access: Stairs to enter ?  ?Entrance Stairs-Number of Steps: 3 ?  ?Home Layout: Able to live on main level with bedroom/bathroom ?Home Equipment: None ?   ?  ?Prior Function Prior Level of Function : Independent/Modified Independent ?  ?  ?  ?  ?  ?  ?  ?  ?  ? ? ?Hand Dominance  ?   ? ?  ?Extremity/Trunk Assessment  ? Upper Extremity Assessment ?Upper Extremity Assessment: Overall WFL for tasks assessed ?  ? ?Lower Extremity Assessment ?Lower Extremity Assessment: Generalized weakness ?  ? ?Cervical / Trunk Assessment ?Cervical / Trunk Assessment: Normal  ?Communication  ? Communication: No difficulties  ?Cognition Arousal/Alertness: Awake/alert ?  Behavior During Therapy: [facility] for tasks assessed/performed ?Overall Cognitive Status: Within Functional Limits for tasks assessed ?  ?  ?  ?  ?  ?  ?  ?  ?  ?  ?  ?  ?  ?  ?  ?  ?  ?  ?  ? ?  ?General Comments   ? ?  ?Exercises General Exercises - Lower Extremity ?Long Arc Quad: AROM, 10 reps, Seated ?Heel Raises: AROM, Both, 5 reps, Standing  ? ?Assessment/Plan  ?  ?PT Assessment Patient needs continued PT services  ?PT Problem List Decreased strength;Decreased mobility;Decreased activity tolerance;Decreased knowledge of use of DME ? ?   ?  ?PT Treatment  Interventions DME instruction;Gait training;Therapeutic exercise;Balance training;Functional mobility training;Therapeutic activities;Patient/family education   ? ?PT Goals (Current goals can be found in the Care Plan section)  ?Acute Rehab PT Goals ?Patient Stated Goal: to regain PLOF ?PT Goal Formulation: With patient ?Time For Goal Achievement: 11/26/21 ?Potential to Achieve Goals: Good ? ?  ?Frequency Min 3X/week ?  ? ? ?Co-evaluation   ?  ?  ?  ?  ? ? ?  ?AM-PAC PT "6 Clicks" Mobility  ?Outcome Measure Help needed turning from your back to your side while in a flat bed without using bedrails?: A Little ?Help needed moving from lying on your back to sitting on the side of a flat bed without using bedrails?: A Little ?Help needed moving to and from a bed to a chair (including a wheelchair)?: A Little ?Help needed standing up from a chair using your arms (e.g., wheelchair or bedside chair)?: A Little ?Help needed to walk in hospital room?: Total ?Help needed climbing 3-5 steps with a railing? : Total ?6 Click Score: 14 ? ?  ?End of Session Equipment Utilized During Treatment: Oxygen (heated high flow O2) ?Activity Tolerance:  (limited by dyspnea, drop in O2 sats) ?Patient left: in chair;with call bell/phone within reach ?  ?PT Visit Diagnosis: Difficulty in walking, not elsewhere classified (R26.2);Muscle weakness (generalized) (M62.81) ?  ? ?Time: 2841-3244 ?PT Time Calculation (min) (ACUTE ONLY): 17 min ? ? ?Charges:   PT Evaluation ?$PT Eval Moderate Complexity: 1 Mod ?  ?  ?   ? ? ?Oracio Galen P, PT ?Acute Rehabilitation  ?Office: [phone] ?Pager: [phone] ? ? ? ?  ? ?

## 2021-11-13 ENCOUNTER — Inpatient Hospital Stay (HOSPITAL_COMMUNITY): Payer: Medicare Other

## 2021-11-13 DIAGNOSIS — Z515 Encounter for palliative care: Secondary | ICD-10-CM

## 2021-11-13 DIAGNOSIS — R0602 Shortness of breath: Secondary | ICD-10-CM | POA: Diagnosis not present

## 2021-11-13 DIAGNOSIS — Z7189 Other specified counseling: Secondary | ICD-10-CM

## 2021-11-13 DIAGNOSIS — J84112 Idiopathic pulmonary fibrosis: Secondary | ICD-10-CM | POA: Diagnosis not present

## 2021-11-13 LAB — GLUCOSE, CAPILLARY
Glucose-Capillary: 108 mg/dL — ABNORMAL HIGH (ref 70–99)
Glucose-Capillary: 142 mg/dL — ABNORMAL HIGH (ref 70–99)
Glucose-Capillary: 182 mg/dL — ABNORMAL HIGH (ref 70–99)
Glucose-Capillary: 118 mg/dL — ABNORMAL HIGH (ref 70–99)

## 2021-11-13 LAB — BASIC METABOLIC PANEL
Anion gap: 7 (ref 5–15)
BUN: 38 mg/dL — ABNORMAL HIGH (ref 8–23)
CO2: 27 mmol/L (ref 22–32)
Calcium: 8.3 mg/dL — ABNORMAL LOW (ref 8.9–10.3)
Chloride: 104 mmol/L (ref 98–111)
Creatinine, Ser: 0.69 mg/dL (ref 0.61–1.24)
GFR, Estimated: >60 mL/min (ref >60)
Glucose, Bld: 136 mg/dL — ABNORMAL HIGH (ref 70–99)
Potassium: 4.8 mmol/L (ref 3.5–5.1)
Sodium: 138 mmol/L (ref 135–145)

## 2021-11-13 NOTE — TOC Initial Note (Signed)
Transition of Care (TOC) - Initial/Assessment Note  ? ? ?Patient Details  ?Name: [place] ?MRN: [id number] ?Date of Birth: 1945-10-06 ? ?Transition of Care [facility]) CM/SW Contact:    ?Dessa Phi, RN ?Phone Number: ?11/13/2021, 10:09 AM ? ?Clinical Narrative:  From home.spoke to spouse diane-no preference for DME company if home 02 needed-will monitor on 02. PT following for progress.              ? ? ?Expected Discharge Plan: [place] ?Barriers to Discharge: Continued Medical Work up ? ? ?Patient Goals and CMS Choice ?Patient states their goals for this hospitalization and ongoing recovery are:: Home ?CMS Medicare.gov Compare Post Acute Care list provided to:: Patient Represenative (must comment) (Diane spouse [phone]) ?Choice offered to / list presented to : Spouse ? ?Expected Discharge Plan and Services ?Expected Discharge Plan: [place] ?  ?Discharge Planning Services: CM Consult ?Post Acute Care Choice: Home Health ?Living arrangements for the past 2 months: [place] ?                ?  ?  ?  ?  ?  ?  ?  ?  ?  ?  ? ?Prior Living Arrangements/Services ?Living arrangements for the past 2 months: [place] ?Lives with:: Spouse ?Patient language and need for interpreter reviewed:: Yes ?Do you feel safe going back to the place where you live?: Yes      ?Need for Family Participation in Patient Care: Yes (Comment) ?Care giver support system in place?: Yes (comment) ?Current home services: DME (rw,3n1) ?Criminal Activity/Legal Involvement Pertinent to Current Situation/Hospitalization: No - Comment as needed ? ?Activities of Daily Living ?Home Assistive Devices/Equipment: None ?ADL Screening (condition at time of admission) ?Patient's cognitive ability adequate to safely complete daily activities?: Yes ?Is the patient deaf or have difficulty hearing?: No ?Does the patient have difficulty seeing, even when wearing glasses/contacts?: No ?Does the patient  have difficulty concentrating, remembering, or making decisions?: No ?Patient able to express need for assistance with ADLs?: Yes ?Does the patient have difficulty dressing or bathing?: No ?Independently performs ADLs?: Yes (appropriate for developmental age) ?Does the patient have difficulty walking or climbing stairs?: No ?Weakness of Legs: None ?Weakness of Arms/Hands: None ? ?Permission Sought/Granted ?Permission sought to share information with : Case Manager ?Permission granted to share information with : Yes, Verbal Permission Granted ? Share Information with NAME: Case manager ?   ?   ?   ? ?Emotional Assessment ?Appearance:: Appears stated age ?Attitude/Demeanor/Rapport: Gracious ?Affect (typically observed): Accepting ?Orientation: : Oriented to Self, Oriented to Place, Oriented to  Time, Oriented to Situation ?Alcohol / Substance Use: Not Applicable ?Psych Involvement: No (comment) ? ?Admission diagnosis:  IPF (idiopathic pulmonary fibrosis) ([place]) [J84.112] ?Patient Active Problem List  ? Diagnosis Date Noted  ? IPF (idiopathic pulmonary fibrosis) ([place]) 10/21/2021  ? [place] of right foot 11/29/2020  ? Osteoarthritis 10/08/2020  ? Hyperglycemia 05/25/2020  ? Interstitial lung disease ([place]) 04/20/2020  ? Thoracic aortic aneurysm without rupture ([place]) 04/02/2020  ? History of total knee replacement, right 12/17/2018  ? S/p reverse total shoulder arthroplasty 05/21/2017  ? Foot pain 12/25/2016  ? HTN (hypertension) 10/16/2015  ? Adenocarcinoma of prostate ([place]) 04/16/2013  ? Renal cysts, acquired, bilateral 07/15/2011  ? HEARING LOSS 09/23/2010  ? SLEEP APNEA, OBSTRUCTIVE 03/05/2010  ? ORGANIC IMPOTENCE 02/22/2009  ? PARESTHESIA 02/22/2009  ? Internal hemorrhoids 10/31/2008  ? HYPOTHYROIDISM, POST-RADIOACTIVE IODINE  10/21/2007  ? Dyslipidemia 10/21/2007  ? Anxiety and depression 10/21/2007  ? GERD 10/21/2007  ? Osteoporosis 10/21/2007  ? ?PCP:  Vivi Barrack, MD ?Pharmacy:   ?[id number]  - [place], [place] - [address] ?[address] ?[place] [address] ?Phone: [phone] Fax: [phone] ? ?[place], [place] ?[address] ?[place] [address] ?Phone: [phone] Fax: [phone] ? ?[place], [place] ?[address] ?[place] [address] ?Phone: [phone] Fax: [phone] ? ? ? ? ?Social Determinants of Health (SDOH) Interventions ?  ? ?Readmission Risk Interventions ?   ? View : No data to display.  ?  ?  ?  ? ? ? ?

## 2021-11-13 NOTE — Consult Note (Signed)
? ?                                                                                ?Consultation Note ?Date: 11/14/2021  ? ?Patient Name: [place]  ?DOB: 15-Sep-1945  MRN: [id number]  Age / Sex: 76 y.o., male  ?PCP: Vivi Barrack, MD ?Referring Physician: Candee Furbish, MD ? ?Reason for Consultation: Establishing goals of care ? ?HPI/Patient Profile: 76 y.o. male  with past medical history of idiopathic pulmonary fibrosis admitted on 10/12/2021 with likely IPF flare.  He was initially admitted in [place] but transferred to [facility] last week.  He was found to be positive for rhinovirus at time of admission to the hospital.  He has remained on high flow nasal cannula and is receiving high-dose steroids.  He completed course of antibiotics. ? ?Clinical Assessment and Goals of Care: ?Palliative care consult received.  Chart reviewed including personal review of pertinent labs and imaging. ? ?I met today with [name] and his wife, [place].  He is a very pleasant male lying in bed with increased work of breathing but reports that he is feeling well at this point in time. ? ?He tells me the most important things to him are his family, including his wife, daughter, son-in-law, and grand child.  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 ? ?I introduced palliative care as specialized medical care for people living with serious illness. It focuses on providing relief from the symptoms and stress of a serious illness. The goal is to improve quality of life for both the patient and the family. ? ?We discussed the challenges related to his IPF and he tells me that he has tried his best to remain active and goes to the gym regularly.  He says that the doctors have been doing a good job explaining things to him,  and he reports being hopeful that with more time he will show more improvement.  He states that hospice was mentioned to him and he is nowhere near ready for consideration for hospice at this point. ? ?We discussed challenges he has faced this encounter and changes in his nutrition, cognition, and functional status that have occurred since diagnosis with IPF with acute worsening this admission.  He understands that this is something that has slow process to recovery, and there may in fact be changes where his body does not recover from this particular flare. ? ?He has a lot of faith in his medical care team but he also understands limitations related to IPF.  He reports being hopeful there may be other medications or trials that could be helpful for him and tells me he would also like to discuss with Dr. Chase Caller when he comes on service later this week. ? ?SUMMARY OF RECOMMENDATIONS   ?- DNR/DNI ?- His wife would be his surrogate in the event he cannot make his own medical decisions. ?- He is hopeful for improvement and is open  to any other trials or other medical interventions that may be offered to him.  He understands concern due to lack of significant improvement to this point, but he states that he feels that he has "a few years left in me."  He specifically stated he is not at a point where he wants to consider hospice, and I clarified with him that my role in palliative care is to support him wherever he is with his disease process and I am not there to "push" any agenda.  We also discussed potential symptom management needs.  He was appreciative of support provided. ?- Palliative to continue to follow and I told him I would check in sometime this weekend. ? ?Prognosis:  ?Guarded ? ?Discharge Planning: To Be Determined  ? ?  ? ?Primary Diagnoses: ?Present on Admission: ? IPF (idiopathic pulmonary fibrosis) ([place]) ? ? ?I have reviewed the medical record, interviewed the patient and family, and examined the  patient. The following aspects are pertinent. ? ?Past Medical History:  ?Diagnosis Date  ? Adenocarcinoma of prostate ([place]) 04/16/2013  ? Aneurysm, aorta, thoracic ([place])   ? Anxiety   ? Arthritis   ? "knees" (05/21/2017)  ? Depression   ? Diverticulosis   ? Dyspnea   ? on exertion for a long period time  ? GERD (gastroesophageal reflux disease)   ? Hyperlipidemia   ? Hypertension   ? Hypothyroidism, postradioiodine therapy   ? 1980's  ? Nocturia   ? Organic impotence   ? OSA (obstructive sleep apnea)   ? NO CPAP--  S/P SURGERY  2002  ? Osteoporosis   ? Pneumonia 1970  ? PONV (postoperative nausea and vomiting)   ? Urge urinary incontinence   ? Wears contact lenses   ? ?Social History  ? ?Socioeconomic History  ? Marital status: Married  ?  Spouse name: Not on file  ? Number of children: Not on file  ? Years of education: Not on file  ? Highest education level: Not on file  ?Occupational History  ? Occupation: Patent examiner  ?Tobacco Use  ? Smoking status: Former  ?  Packs/day: 1.00  ?  Years: 10.00  ?  Pack years: 10.00  ?  Types: Cigarettes  ?  Quit date: 08/11/1978  ?  Years since quitting: 43.2  ? Smokeless tobacco: Never  ?Vaping Use  ? Vaping Use: Never used  ?Substance and Sexual Activity  ? Alcohol use: Not Currently  ?  Comment: 05/21/2017 "1-2 beers/month"  ? Drug use: No  ? Sexual activity: Not Currently  ?Other Topics Concern  ? Not on file  ?Social History Narrative  ? Not on file  ? ?Social Determinants of Health  ? ?Financial Resource Strain: Low Risk   ? Difficulty of Paying Living Expenses: Not hard at all  ?Food Insecurity: No Food Insecurity  ? Worried About Charity fundraiser in the Last Year: Never true  ? Ran Out of Food in the Last Year: Never true  ?Transportation Needs: No Transportation Needs  ? Lack of Transportation (Medical): No  ? Lack of Transportation (Non-Medical): No  ?Physical Activity: Sufficiently Active  ? Days of Exercise per Week: 3 days  ? Minutes of Exercise per Session:  90 min  ?Stress: Stress Concern Present  ? Feeling of Stress : To some extent  ?Social Connections: Moderately Isolated  ? Frequency of Communication with Friends and Family: More than three times a week  ? Frequency of Social Gatherings with  Friends and Family: More than three times a week  ? Attends Religious Services: Never  ? Active Member of Clubs or Organizations: No  ? Attends Archivist Meetings: Never  ? Marital Status: Married  ? ?Family History  ?Problem Relation Age of Onset  ? Cancer Mother   ?     Pancreatic Cancer  ? Heart disease Father 91  ?     CABG  ? ?Scheduled Meds: ? amLODipine  5 mg Oral Daily  ? ARIPiprazole  2 mg Oral Daily  ? atorvastatin  40 mg Oral QPM  ? azelastine  2 spray Each Nare BID  ? Chlorhexidine Gluconate Cloth  6 each Topical Daily  ? clonazePAM  1.5 mg Oral QHS  ? enoxaparin (LOVENOX) injection  40 mg Subcutaneous Q24H  ? fluticasone  2 spray Each Nare Daily  ? gabapentin  800 mg Oral BID  ? insulin aspart  0-15 Units Subcutaneous TID WC  ? insulin aspart  0-5 Units Subcutaneous QHS  ? levothyroxine  112 mcg Oral Daily  ? methylPREDNISolone (SOLU-MEDROL) injection  80 mg Intravenous Q12H  ? multivitamin with minerals  1 tablet Oral Daily  ? pantoprazole  40 mg Oral Daily  ? sulfamethoxazole-trimethoprim  1 tablet Oral Once per day on Mon Wed Fri  ? tamsulosin  0.4 mg Oral Daily  ? venlafaxine XR  300 mg Oral Daily  ? ?Continuous Infusions: ?PRN Meds:.acetaminophen, albuterol, benzonatate, clonazePAM, docusate sodium, guaiFENesin-dextromethorphan, HYDROcodone-acetaminophen, ondansetron (ZOFRAN) IV, polyethylene glycol ?Medications Prior to Admission:  ?Prior to Admission medications   ?Medication Sig Start Date End Date Taking? Authorizing Provider  ?acetaminophen (TYLENOL) 500 MG tablet Take 1,000 mg by mouth 2 (two) times daily as needed for headache (pain).   Yes [provider]  ?albuterol (VENTOLIN HFA) 108 (90 Base) MCG/ACT inhaler Inhale 2 puffs into  the lungs every 6 (six) hours as needed for wheezing or shortness of breath. 10/22/21  Yes Allwardt, Randa Evens, PA-C  ?ARIPiprazole (ABILIFY) 2 MG tablet TAKE 1 TABLET DAILY ?Patient taking differently: Ta

## 2021-11-13 NOTE — Plan of Care (Signed)

## 2021-11-13 NOTE — Progress Notes (Signed)
? ?NAME:  Bryan [place], MRN:  [id number], DOB:  25-Aug-1945, LOS: 8 ?ADMISSION DATE:  11/08/2021, CONSULTATION DATE:  3/28 ?REFERRING MD:  [place], [place], CHIEF COMPLAINT:  Dyspnea  ? ?History of Present Illness:  ?This is a pleasant 76 year old male with a past medical history significant for idiopathic pulmonary fibrosis who has been followed by my partners in the pulmonary clinic for the same for several years who presents to our facility on transfer from [place] with a diagnosis of acute respiratory failure with hypoxemia due to an IPF exacerbation.  The patient has previously been followed by Dr. Vaughan Tyler and has been enrolled in a clinical trial since January of this year entitled:  ?[facility] (FibroGen Tyler) is a Phase 3, randomized, double-blind, placebo-controlled multicenter international [place] to evaluate the efficacy and safety of 30 mg/kg IV infusions of pamrevlumab administered every 3 weeks for 52 weeks as compared to placebo in subjects with Idiopathic Pulmonary Fibrosis. Primary end point is: change in FVC from baseline at week 52. [place]. ? ?The patient tells me that in prior years he was intolerant of antifibrotic agents for pulmonary fibrosis.  However, he has remained active, and says that he was not using oxygen prior to admission.  Approximately 1 week prior to admission he said that after going to the gym he felt "a little different" but did not complain of shortness of breath or cough.  On Friday, March 23 he traveled to [place] where shortly after arrival he began to become very short of breath with associated headache and dry cough.  He denies fevers chills, rash, nausea, vomiting, chest pain or sinus complaint.  He is unaware of any sick contacts.  After 2 days of dyspnea his son-in-law took him to the emergency department in [place] where he was admitted for severe acute respiratory failure with hypoxemia by the  hospitalist service.  Pulmonary was consulted at [place], specifically [facility] [place].  The patient had a work-up in [place] which included a respiratory viral panel which was negative for COVID and flu however an extended panel showed rhinovirus infection.  Lab work showed a BNP which is mildly elevated at 136 troponin was slightly elevated.  He had a CT angiogram of his chest which showed no pulmonary embolism but he did have severe pulmonary fibrosis and bilateral peripheral opacification.  He was treated with IV steroids, azithromycin and cefepime.  An echocardiogram was performed.  On admission his oxygen needs were 6 L of nasal cannula (March 25) but he was moved to the intensive care unit for progressive worsening of oxygenation and required heated high flow.  Request for transfer to [place] was made on March 27 but there were no beds available so he was moved today March 28.  By the time of being moved he was transferred on Optiflow (heated high flow) at 40 L/min and 60% FiO2. ? ?Other lab shows a mycoplasma IgM MRSA swab negative basic metabolic panel is normal troponin slightly 77, she is normal with the exception of a slightly elevated [place].  This is 1.2/dL ? ?Pertinent  Medical History  ?Idiopathic pulmonary fibrosis ?Adenocarcinoma of the prostate ?Thoracic aortic aneurysm ?Anxiety ?Depression ?Diverticulosis ?GERD ?Hyperlipidemia ?Hypertension ?Hypothyroidism ?Obstructive sleep apnea, not on CPAP ?Osteoporosis ? ?Significant Tyler Events: ?Including procedures, antibiotic start and stop dates in addition to other pertinent events   ?March 25, admitted to [place] in [place], pulmonary consulted, treated with steroids, ceftriaxone,  azithromycin for an IPF flare, noted to be rhinovirus positive.  COVID and flu testing negative ?March 26 moved to the intensive care unit for heated high flow ?March 28 transferred to [place] ?3/31 On 20 L / 60% ?4/2 HFNC 30L/ 50%,  continues to desaturate w/minimal exertion ?4/3 and 4/4 no changes, remains on 50-60%, 30L and NRB w/exertion.  Sed rate wnl, PCT neg ? ?3/28 ceftriaxone > 4/1 ?3/28 azithro > 3/30 ? ?Interim History / Subjective:  ?No events  ? ?Objective   ?Blood pressure 129/78, pulse 73, temperature 97.7 ?F (36.5 ?C), temperature source Axillary, resp. rate (!) 22, weight 79.4 kg, SpO2 (!) 85 %. ?   ?FiO2 (%):  [50 %-60 %] 55 %  ? ?Intake/Output Summary (Last 24 hours) at 11/13/2021 1112 ?Last data filed at 11/13/2021 0900 ?Gross per 24 hour  ?Intake 460 ml  ?Output 1875 ml  ?Net -1415 ml  ? ?Filed Weights  ? 11/11/21 0354 11/12/21 0500 11/13/21 0500  ?Weight: 82.1 kg 79.4 kg 79.4 kg  ? ? ?Examination:  ?General:  Very pleasant thin older male sitting upright in bed in NAD eating breakfast ?HEENT: MM pink/moist ?Neuro:  Aox3, MAE ?CV: rr, NSR ?PULM:  non labored at rest, tachypneic, diffuse rales  ?GI: +bs, NT, voids ?Extremities: warm/dry, no LE edema, poor muscle mass   ?Skin: no rashes  ? ?BMET unremarkable, calcium corrects normally  ?Afebrile  ? ?Cxr > worse infiltrates bilaterally vs suspected fibrosis  ? ?Resolved Tyler Problem list   ? ? ?Assessment & Plan:  ?Acute respiratory failure with hypoxemia due to an IPF flare due to acute rhinovirus infection ?On clinical trial/research protocol for idiopathic pulmonary fibrosis investigational agent ([place] (FibroGen [place])) ?- completed abx 4/1 ?- cleared by SLP- 3/31, mild aspiration risk  ?- continue HHFNC and NRB w/activity ?- chest CT today given no improvements despite treatment and worsening CXR.  Concerned that this is rapid progression of fibrosis and options may be limited ?- monitor volume status- remains euvolemic  ?- ongoing aggressive pulm hygiene- IS, flutter, PT, BD, mucinex, PT ?- cont solumedrol '80mg'$  BID for now, may be able to go to daily  ?- delirium precautions  ?- continue PJP ppx w/ bactrim ?- remains DNR/ DNI  ?- prn clonazepm for anxiety, and qHS  for sleep ?- consulting PMT to assist with symptom management and establish for outpt care ?- patient considering short term stay at [place] but ideally wants to go home ? ?Hypothyroidism: ?- cont Synthroid ? ?Hyperglycemia due to Solu-Medrol ?- remains stable.  SSI AC/ HS moderate  ? ?Anxiety ?depression ?- prn clonazepam during day and q HS for insomnia.  May need to add morphine for dyspnea/ anxiety if clonazepam does not help ?- cont Effexor ? ?Dementia: ?- cont Aricept ? ?BPH/history of prostate cancer ?- cont Flomax ? ?GERD: ?- cont Protonix ? ?HTN, HLD ?- cont norvasc and lipitor  ? ? ?Best Practice (right click and "Reselect all SmartList Selections" daily)  ? ?Diet/type: Regular consistency (see orders) ?DVT prophylaxis: LMWH ?GI prophylaxis: PPI ?Lines: N/A ?Foley:  N/A ?Code Status:  DNR ?Last date of multidisciplinary goals of care discussion [3/28] ?Daughter who lives in out of [place] updated by Dr. Tamala Julian 4/3.  Patient updated on plan of care 4/5 ? ? ? ?Kennieth Rad, ACNP ?[facility] ?11/13/2021, 11:12 AM ? ?See Amion for pager ?If no response to pager, please call PCCM consult pager ?After 7:00 pm call Elink   ? ? ? ? ? ? ? ? ?

## 2021-11-14 DIAGNOSIS — J84112 Idiopathic pulmonary fibrosis: Secondary | ICD-10-CM | POA: Diagnosis not present

## 2021-11-14 LAB — GLUCOSE, CAPILLARY
Glucose-Capillary: 111 mg/dL — ABNORMAL HIGH (ref 70–99)
Glucose-Capillary: 97 mg/dL (ref 70–99)
Glucose-Capillary: 159 mg/dL — ABNORMAL HIGH (ref 70–99)
Glucose-Capillary: 143 mg/dL — ABNORMAL HIGH (ref 70–99)
Glucose-Capillary: 134 mg/dL — ABNORMAL HIGH (ref 70–99)

## 2021-11-14 NOTE — Progress Notes (Signed)
? ?NAME:  Bryan Tyler, MRN:  [id number], DOB:  November 30, 1945, LOS: 9 ?ADMISSION DATE:  10/11/2021, CONSULTATION DATE:  3/28 ?REFERRING MD:  Lingamgunta, [place], CHIEF COMPLAINT:  Dyspnea  ? ?History of Present Illness:  ?This is a pleasant 76 year old male with a past medical history significant for idiopathic pulmonary fibrosis who has been followed by my partners in the pulmonary clinic for the same for several years who presents to our facility on transfer from [facility] in [facility] with a diagnosis of acute respiratory failure with hypoxemia due to an IPF exacerbation.  The patient has previously been followed by Dr. Vaughan Browner and has been enrolled in a clinical trial since January of this year entitled:  ?FGCL-3019-095 (FibroGen Study) is a Phase 3, randomized, double-blind, placebo-controlled multicenter international study to evaluate the efficacy and safety of 30 mg/kg IV infusions of pamrevlumab administered every 3 weeks for 52 weeks as compared to placebo in subjects with Idiopathic Pulmonary Fibrosis. Primary end point is: change in FVC from baseline at week 52. ZEPHYRUS II STUDY. ? ?The patient tells me that in prior years he was intolerant of antifibrotic agents for pulmonary fibrosis.  However, he has remained active, and says that he was not using oxygen prior to admission.  Approximately 1 week prior to admission he said that after going to the gym he felt "a little different" but did not complain of shortness of breath or cough.  On Friday, March 23 he traveled to [facility] where shortly after arrival he began to become very short of breath with associated headache and dry cough.  He denies fevers chills, rash, nausea, vomiting, chest pain or sinus complaint.  He is unaware of any sick contacts.  After 2 days of dyspnea his son-in-law took him to the emergency department in [place] where he was admitted for severe acute respiratory failure with hypoxemia by the  hospitalist service.  Pulmonary was consulted at [facility], specifically Drs. Posey Pronto and [place].  The patient had a work-up in [place] which included a respiratory viral panel which was negative for COVID and flu however an extended panel showed rhinovirus infection.  Lab work showed a BNP which is mildly elevated at 136 troponin was slightly elevated.  He had a CT angiogram of his chest which showed no pulmonary embolism but he did have severe pulmonary fibrosis and bilateral peripheral opacification.  He was treated with IV steroids, azithromycin and cefepime.  An echocardiogram was performed.  On admission his oxygen needs were 6 L of nasal cannula (March 25) but he was moved to the intensive care unit for progressive worsening of oxygenation and required heated high flow.  Request for transfer to [name] was made on March 27 but there were no beds available so he was moved today March 28.  By the time of being moved he was transferred on Optiflow (heated high flow) at 40 L/min and 60% FiO2. ? ?Other lab shows a mycoplasma IgM MRSA swab negative basic metabolic panel is normal troponin slightly 77, she is normal with the exception of a slightly elevated [name].  This is 1.2/dL ? ?Pertinent  Medical History  ?Idiopathic pulmonary fibrosis ?Adenocarcinoma of the prostate ?Thoracic aortic aneurysm ?Anxiety ?Depression ?Diverticulosis ?GERD ?Hyperlipidemia ?Hypertension ?Hypothyroidism ?Obstructive sleep apnea, not on CPAP ?Osteoporosis ? ?Significant Hospital Events: ?Including procedures, antibiotic start and stop dates in addition to other pertinent events   ?March 25, admitted to [facility] in [place], pulmonary consulted, treated with steroids, ceftriaxone,  azithromycin for an IPF flare, noted to be rhinovirus positive.  COVID and flu testing negative ?March 26 moved to the intensive care unit for heated high flow ?March 28 transferred to [facility] long hospital ?3/31 On 20 L / 60% ?4/2 HFNC 30L/ 50%,  continues to desaturate w/minimal exertion ?4/3 and 4/4 no changes, remains on 50-60%, 30L and NRB w/exertion.  Sed rate wnl, PCT neg ? ?3/28 ceftriaxone > 4/1 ?3/28 azithro > 3/30 ? ?Interim History / Subjective:  ?NAEO  ? ?Feels better today than rest of admission  ? ?Objective   ?Blood pressure 123/70, pulse 63, temperature 97.6 ?F (36.4 ?C), temperature source Axillary, resp. rate (!) 22, weight 80.1 kg, SpO2 92 %. ?   ?FiO2 (%):  [50 %-100 %] 50 %  ? ?Intake/Output Summary (Last 24 hours) at 11/14/2021 0720 ?Last data filed at 11/14/2021 0500 ?Gross per 24 hour  ?Intake 150 ml  ?Output 1325 ml  ?Net -1175 ml  ? ?Filed Weights  ? 11/12/21 0500 11/13/21 0500 11/14/21 0500  ?Weight: 79.4 kg 79.4 kg 80.1 kg  ? ? ?Examination:  ?General:  Chronically ill older adult NAD ?HEENT: NCAT  ?Neuro:  AAOx3 following commands. Fine tremor  ?CV:rrr s1s2 cap refill brisk  ?PULM:  Shallow respirations, symmetrical chest expansion. Crackles  ?GI: soft ndnt  ?Extremities: no acute deformity  ?Skin: c/d/w  ?  ? ?Resolved Hospital Problem list   ? ? ?Assessment & Plan:  ? ? ?Acute respiratory failure w hypoxemia ?IPF flare due to rhinovirus  ?-on research protocol for IPH investigational agent (FGCL-3019-095 (FibroGen Study)) ?- completed abx 4/1 ?-repeat CT with incr GGO ?P ?-mild aspiration risk per SLP ?-cont HHFNC, NRB  ?-goal euvolemia  ?-cont steroids ?-IS, pulm hygiene, flutter/IS, mucinex ?-cont bactrim for PJP ppx ?-DNR/I ?-Palliative met w pt 4/5 ? ?Dementia ?Anxiety ?Depression  ?P ?-Aricept  ?- prn clonazepm for anxiety, and qHS for sleep ?-effexor ?-delirium precautions  ? ?Hypothyroidism ?P ?- cont Synthroid ? ?Steroid induced hyperglycemia ?- SSI ? ?BPH ?Hx prostate cancer  ?- Flomax ? ?GERD ?- PPI ? ?HTN ?HLD ?P ?- lipitor ?- norvasc  ? ? ?Best Practice (right click and "Reselect all SmartList Selections" daily)  ? ?Diet/type: Regular consistency (see orders) ?DVT prophylaxis: LMWH ?GI prophylaxis: PPI ?Lines:  N/A ?Foley:  N/A ?Code Status:  DNR ?Last date of multidisciplinary goals of care discussion [4/3] Pt updated daily  ? ?CCT: n/a  ? ? ?Eliseo Gum MSN, AGACNP-BC ?[facility] ?Amion for pager  ?11/14/2021, 7:20 AM ? ? ? ? ? ? ? ? ?

## 2021-11-15 ENCOUNTER — Inpatient Hospital Stay (HOSPITAL_COMMUNITY): Payer: Medicare Other

## 2021-11-15 ENCOUNTER — Ambulatory Visit: Payer: Medicare Other

## 2021-11-15 DIAGNOSIS — J84112 Idiopathic pulmonary fibrosis: Secondary | ICD-10-CM | POA: Diagnosis not present

## 2021-11-15 DIAGNOSIS — J9601 Acute respiratory failure with hypoxia: Secondary | ICD-10-CM | POA: Diagnosis not present

## 2021-11-15 HISTORY — PX: IR US GUIDE VASC ACCESS RIGHT: IMG2390

## 2021-11-15 LAB — GLUCOSE, CAPILLARY
Glucose-Capillary: 109 mg/dL — ABNORMAL HIGH (ref 70–99)
Glucose-Capillary: 124 mg/dL — ABNORMAL HIGH (ref 70–99)
Glucose-Capillary: 164 mg/dL — ABNORMAL HIGH (ref 70–99)
Glucose-Capillary: 155 mg/dL — ABNORMAL HIGH (ref 70–99)

## 2021-11-15 MED ORDER — HEPARIN SODIUM (PORCINE) 1000 UNIT/ML IJ SOLN
INTRAMUSCULAR | Status: AC
Start: 2021-11-15 — End: 2021-11-15
  Filled 2021-11-15: qty 10

## 2021-11-15 MED ORDER — LIDOCAINE-EPINEPHRINE 1 %-1:100000 IJ SOLN
INTRAMUSCULAR | Status: AC
Start: 2021-11-15 — End: 2021-11-15
  Filled 2021-11-15: qty 1

## 2021-11-15 MED ORDER — HYDROXYZINE HCL 25 MG PO TABS
25.0000 mg | ORAL_TABLET | Freq: Four times a day (QID) | ORAL | Status: DC | PRN
  Administered 2021-11-15 – 2021-11-21 (×9): 25 mg via ORAL
  Filled 2021-11-15 (×9): qty 1

## 2021-11-15 MED ORDER — LIDOCAINE HCL 1 % IJ SOLN
INTRAMUSCULAR | Status: AC
  Filled 2021-11-15: qty 20

## 2021-11-15 NOTE — Procedures (Signed)
Interventional Radiology Procedure: ? ? ?Indications: IPF with respiratory distress.  Needs plasmapheresis catheter ? ?Procedure: Placement of right jugular pheresis catheter with US guidance ? ?Findings: 15 cm Trialysis placed in right IJ.  Tip near SVC/RA junction on portable CXR.    ? ?Complications: No immediate complications noted. ?    ?EBL: Minimal ? ?Plan: Pheresis is ready to use.   ? ? ?Octavius Shin R. Anselm Pancoast, MD  ?Pager: [phone] ? ? ? ?  ?

## 2021-11-15 NOTE — Progress Notes (Signed)
Patient transported to IR without incident. He was placed on HHFNC while in IR. Patient is resting back in room and maintaining saturations @ 94% Rt will continue to monitor ?

## 2021-11-15 NOTE — Progress Notes (Addendum)
? ?NAME:  Bryan Tyler, MRN:  [id number], DOB:  [birth year], LOS: 10 ?ADMISSION DATE:  10/21/2021, CONSULTATION DATE:  3/28 ?REFERRING MD:  Lingamgunta, [place], CHIEF COMPLAINT:  Dyspnea  ? ?BRIEF  ?This is a pleasant 76 year old male with a past medical history significant for idiopathic pulmonary fibrosis who has been followed by my partners in the pulmonary clinic for the same for several years who presents to our facility on transfer from [facility] in [facility] with a diagnosis of acute respiratory failure with hypoxemia due to an IPF exacerbation.  The patient has previously been followed by Dr. Vaughan Browner and has been enrolled in a clinical trial since January of this year entitled:  ?FGCL-3019-095 (FibroGen Study) is a Phase 3, randomized, double-blind, placebo-controlled multicenter international study to evaluate the efficacy and safety of 30 mg/kg IV infusions of pamrevlumab administered every 3 weeks for 52 weeks as compared to placebo in subjects with Idiopathic Pulmonary Fibrosis. Primary end point is: change in FVC from baseline at week 52. ZEPHYRUS II STUDY. ? ?The patient tells me that in prior years he was intolerant of antifibrotic agents for pulmonary fibrosis.  However, he has remained active, and says that he was not using oxygen prior to admission.  Approximately 1 week prior to admission he said that after going to the gym he felt "a little different" but did not complain of shortness of breath or cough.  On Friday, March 23 he traveled to [facility] where shortly after arrival he began to become very short of breath with associated headache and dry cough.  He denies fevers chills, rash, nausea, vomiting, chest pain or sinus complaint.  He is unaware of any sick contacts.  After 2 days of dyspnea his son-in-law took him to the emergency department in [place] where he was admitted for severe acute respiratory failure with hypoxemia by the hospitalist service.   Pulmonary was consulted at [facility], specifically Drs. Posey Pronto and [place].  The patient had a work-up in [place] which included a respiratory viral panel which was negative for COVID and flu however an extended panel showed rhinovirus infection.  Lab work showed a BNP which is mildly elevated at 136 troponin was slightly elevated.  He had a CT angiogram of his chest which showed no pulmonary embolism but he did have severe pulmonary fibrosis and bilateral peripheral opacification.  He was treated with IV steroids, azithromycin and cefepime.  An echocardiogram was performed.  On admission his oxygen needs were 6 L of nasal cannula (March 25) but he was moved to the intensive care unit for progressive worsening of oxygenation and required heated high flow.  Request for transfer to [name] was made on March 27 but there were no beds available so he was moved today March 28.  By the time of being moved he was transferred on Optiflow (heated high flow) at 40 L/min and 60% FiO2. ? ?Other lab shows a mycoplasma IgM MRSA swab negative basic metabolic panel is normal troponin slightly 77, she is normal with the exception of a slightly elevated [name].  This is 1.2/dL ? ?Pertinent  Medical History  ?Idiopathic pulmonary fibrosis ?Adenocarcinoma of the prostate ?Thoracic aortic aneurysm ?Anxiety ?Depression ?Diverticulosis ?GERD ?Hyperlipidemia ?Hypertension ?Hypothyroidism ?Obstructive sleep apnea, not on CPAP ?Osteoporosis ? ?Significant Hospital Events: ?Including procedures, antibiotic start and stop dates in addition to other pertinent events   ?March 25, admitted to [facility] in [place], pulmonary consulted, treated with steroids, ceftriaxone, azithromycin for an  IPF flare, noted to be rhinovirus positive.  COVID and flu testing negative ?March 26 moved to the intensive care unit for heated high flow ?March 28 transferred to [facility] long hospital ?3/31 On 20 L / 60% ?4/2 HFNC 30L/ 50%, continues to  desaturate w/minimal exertion ?4/3 and 4/4 no changes, remains on 50-60%, 30L and NRB w/exertion.  Sed rate wnl, PCT neg ? ?3/28 ceftriaxone > 4/1 ?3/28 azithro > 3/30 ? ?Interim History / Subjective:  ? ?11/15/2021- 60% fio2, 30L [place]. Sitting on toilet seat. Says is better since admit.  CXR is actually better. Expresses desire to get better ? ?Objective   ?Blood pressure 140/82, pulse 87, temperature 97.6 ?F (36.4 ?C), temperature source Axillary, resp. rate 20, height '5\' 9"'$  (1.753 m), weight 79.2 kg, SpO2 92 %. ?   ?FiO2 (%):  [50 %-70 %] 60 %  ? ?Intake/Output Summary (Last 24 hours) at 11/15/2021 0948 ?Last data filed at 11/15/2021 0700 ?Gross per 24 hour  ?Intake --  ?Output 850 ml  ?Net -850 ml  ? ?Filed Weights  ? 11/13/21 0500 11/14/21 0500 11/15/21 0249  ?Weight: 79.4 kg 80.1 kg 79.2 kg  ? ? ?Examination:  ?General Appearance:  Looks criticall ill. Significant deconditioning an dmuscle wasting +. Sitting on commode ?Head:  Normocephalic, without obvious abnormality, atraumatic ?Eyes:  PERRL - yes, conjunctiva/corneas - mudd     ?Ears:  Normal external ear canals, both ears ?Nose:  G tube - yes with faced mask ?Throat:  ETT TUBE - no , OG tube - no ?Neck:  Supple,  No enlargement/tenderness/nodules ?Lungs: Shallow respiration, tachypenic, crackles at base ?Heart:  S1 and S2 normal, no murmur, CVP - no.  Pressors - no ?Abdomen:  Soft, no masses, no organomegaly ?Genitalia / Rectal:  Not done ?Extremities:  Extremities- intact ?Skin:  ntact in exposed areas . Sacral area - normal ?Neurologic:  Sedation - none -> RASS - +1 . Moves all 4s - ues. CAM-ICU - neg . Orientation - x3+ ? ? ? ?  ? ?Resolved Hospital Problem list   ? ? ?Assessment & Plan:  ? ?IPF  ?- intolerant to oral standard of care anti-fibrotic ?- enrolled in RCT investigational agent (FGCL-3019-095 (FibroGen Study)) -Pamrevlumab - placebo v IMP, sp 2 doses Q3 weeks ? ?Admit: Acute respiratory failure w hypoxemia -IPF flare due to rhinovirus  ?-on research  protocol for IPH - completed abx 4/1 ?-repeat CT with incr GGO ? ?11/15/2021 ?= - still on High flow. Some better but overall same and stuck ? ?P ?- continue steroids - soumedrol '80mg'$  Q12 ?- start autoantibody reduction protocol - (https://journals.plos.org/plosone/article?id=10.1371/journal.pone.4481856) ?- PLEX mixed with IvIG and Rituxan - try PLEX for first 3 days and then reasess x total 15 day protocl ?- place HD cath (IR consulted Dr Markus Daft) same time avoidng to get intubted ?- d/w renal Dr Jonnie Finner ?- questions can call [facility] ?- patient explained about thje fact there is 1 study evidence  of improved outcomes and currently is also being studied in RCT and anecdotally at [facility] with good success ?- he agrees to this protocol ? ?- Research Drug  Pamrevlumab (IP v placebo) infusion - 3rd dose (week #6) due 11/15/21 - 11/22/21 ? -  PulmonIx team will check with sponsor [employer] and if patient willing will aim to infuse IV MAB as inpatient ?-mild aspiration risk per SLP ?-cont HHFNC, NRB  ?-goal euvolemia  ?-cont steroids ?-IS, pulm hygiene, flutter/IS, mucinex ?-cont bactrim for PJP  ppx ?-DNR/I ? ? ?Anxiety ?Depression  ?P ?-Aricept  ?- prn clonazepm for anxiety, and qHS for sleep ?-effexor ?-delirium precautions  ? ?Hypothyroidism ?P ?- cont Synthroid ? ?Steroid induced hyperglycemia ?- SSI ? ?BPH ?Hx prostate cancer  ?- Flomax ?- check PSA ? ?GERD ?- PPI ? ?HTN ?HLD ?P ?- lipitor ?- norvasc  ? ? ?Best Practice (right click and "Reselect all SmartList Selections" daily)  ? ?Diet/type: NPO 11/15/2021 - for procedure ?DVT prophylaxis: LMWH ?GI prophylaxis: PPI ?Lines: N/A ?Foley:  N/A ?Code Status:  DNR but full medical care ? ?Last date of multidisciplinary goals of care discussion [4/3] Pt updated daily  - wife updated separately over phone 11/15/2021 10:46 AM ? ? ?Dr Chase Caller avail for questions via cell/email through afternoon 11/17/21 and then again 11/19/21 onwards ? ?[place]  ? ?The  patient Bryan Tyler is critically ill with multiple organ systems failure and requires high complexity decision making for assessment and support, frequent evaluation and titration of therapies, application

## 2021-11-15 NOTE — Progress Notes (Signed)
PT Cancellation Note ? ?Patient Details ?Name: Bryan Tyler ?MRN: [id number] ?DOB: 09/11/1945 ? ? ?Cancelled Treatment:     PT attempted this am but deferred at request of RN 2* pt increased O2 needs and WOB overnight.  Will follow. ? ? ?Charlis Harner ?11/15/2021, 9:47 AM ?

## 2021-11-15 NOTE — Consult Note (Addendum)
Renal Service ?Consult Note ?[facility] ? ?Nelida Meuse ?11/15/2021 ?Sol Blazing, MD ?Requesting Physician: Dr. Chase Caller ? ?Reason for Consult: IPF patient need for apheresis.  ?HPI: The patient is a 76 y.o. year-old w/ hx of HL, HTN, depression, OSA, PNA and IPF admitted 3/28 for exacerbation of IPF causing acute resp failure w/ hypoxemia. Pt is not doing very well and has exhausted most Rx options and does not tolerate others. Asked to see for a special protocol which involves apheresis.  ? ?Pt seen in room, pleasant, on HFNC, not in distress.  ? ?ROS - denies CP, no joint pain, no HA, no blurry vision, no rash, no diarrhea, no nausea/ vomiting, no dysuria, no difficulty voiding ? ? ?Past Medical History  ?Past Medical History:  ?Diagnosis Date  ? Adenocarcinoma of prostate ([place]) 04/16/2013  ? Aneurysm, aorta, thoracic ([place])   ? Anxiety   ? Arthritis   ? "knees" (05/21/2017)  ? Depression   ? Diverticulosis   ? Dyspnea   ? on exertion for a long period time  ? GERD (gastroesophageal reflux disease)   ? Hyperlipidemia   ? Hypertension   ? Hypothyroidism, postradioiodine therapy   ? 1980's  ? Nocturia   ? Organic impotence   ? OSA (obstructive sleep apnea)   ? NO CPAP--  S/P SURGERY  2002  ? Osteoporosis   ? Pneumonia 1970  ? PONV (postoperative nausea and vomiting)   ? Urge urinary incontinence   ? Wears contact lenses   ? ?Past Surgical History  ?Past Surgical History:  ?Procedure Laterality Date  ? HEMORRHOIDECTOMY WITH HEMORRHOID BANDING  2007  ? INGUINAL HERNIA REPAIR Bilateral   ? x4  (2 each side)  ? INSERTION PROSTATE RADIATION SEED    ? IR US GUIDE VASC ACCESS RIGHT  11/15/2021  ? KNEE ARTHROSCOPY Right 2012  ? NASAL SEPTUM SURGERY  1982  ? w/ rhinoplasty  ? PROSTATE BIOPSY    ? RADIOACTIVE SEED IMPLANT N/A 05/30/2013  ? Procedure: RADIOACTIVE SEED IMPLANT;  Surgeon: Hanley Ben, MD;  Location: [facility];  Service: Urology;  Laterality: N/A;  ? REVERSE SHOULDER  ARTHROPLASTY Right 05/21/2017  ? REVERSE SHOULDER ARTHROPLASTY Right 05/21/2017  ? REVERSE SHOULDER ARTHROPLASTY Right 05/21/2017  ? Procedure: REVERSE RIGHT SHOULDER ARTHROPLASTY;  Surgeon: Justice Britain, MD;  Location: [facility];  Service: Orthopedics;  Laterality: Right;  ? RHINOPLASTY  1982  ? w/w/ septoplasty  ? SHOULDER ARTHROSCOPY W/ ROTATOR CUFF REPAIR Right 2004  ? SHOULDER ARTHROSCOPY W/ ROTATOR CUFF REPAIR Left 2016  ? TONSILLECTOMY  AS CHILD  ? TOTAL KNEE ARTHROPLASTY Right 09/24/2018  ? Procedure: TOTAL KNEE ARTHROPLASTY;  Surgeon: Sydnee Cabal, MD;  Location: [facility];  Service: Orthopedics;  Laterality: Right;  Adductor Block  ? UVULOPALATOPHARYNGOPLASTY  2002  ? ?Family History  ?Family History  ?Problem Relation Age of Onset  ? Cancer Mother   ?     Pancreatic Cancer  ? Heart disease Father 77  ?     CABG  ? ?Social History  reports that he quit smoking about 43 years ago. His smoking use included cigarettes. He has a 10.00 pack-year smoking history. He has never used smokeless tobacco. He reports that he does not currently use alcohol. He reports that he does not use drugs. ?Allergies  ?Allergies  ?Allergen Reactions  ? Codeine Nausea And Vomiting and Other (See Comments)  ?  DIZZINESS  ? Ciprofloxacin Other (See Comments)  ?  Unknown  ?  Quinolones   ?  Patient was warned about not using quinolones. ?Recent studies have raised concern that fluoroquinolone antibiotics could be associated with an increased risk of aortic aneurysm ?Fluoroquinolones have non-antimicrobial properties that might jeopardise the integrity of the extracellular matrix of the vascular wallthere was a 66% increased rate of aortic aneurysm or dissection associated with oral fluoroquinolone use, compared with amoxicillin use, within a 60 day risk period from start of treatment ? ? ?  ? ?Home medications ?Prior to Admission medications   ?Medication Sig Start Date End Date Taking? Authorizing Provider  ?acetaminophen (TYLENOL) 500 MG  tablet Take 1,000 mg by mouth 2 (two) times daily as needed for headache (pain).   Yes [provider]  ?albuterol (VENTOLIN HFA) 108 (90 Base) MCG/ACT inhaler Inhale 2 puffs into the lungs every 6 (six) hours as needed for wheezing or shortness of breath. 10/22/21  Yes Allwardt, Randa Evens, PA-C  ?ARIPiprazole (ABILIFY) 2 MG tablet TAKE 1 TABLET DAILY ?Patient taking differently: Take 2 mg by mouth every morning. 09/20/21  Yes Cottle, Billey Co., MD  ?atorvastatin (LIPITOR) 40 MG tablet TAKE 1 TABLET EVERY EVENING ?Patient taking differently: Take 40 mg by mouth at bedtime. 11/01/21  Yes Vivi Barrack, MD  ?azelastine (ASTELIN) 0.1 % nasal spray Place 2 sprays into both nostrils 2 (two) times daily. ?Patient taking differently: Place 2 sprays into both nostrils 2 (two) times daily as needed for rhinitis or allergies (congestion). 08/21/21  Yes Tawnya Crook, MD  ?clonazePAM Bobbye Charleston) 1 MG tablet 1/2 tablet twice daily as needed for anxiety and 1 and 1/2 tablet at night ?Patient taking differently: Take 1.5 mg by mouth at bedtime. 10/31/21  Yes Cottle, Billey Co., MD  ?fluticasone Asencion Islam) 50 MCG/ACT nasal spray Place 2 sprays into both nostrils daily. ?Patient taking differently: Place 2 sprays into both nostrils daily as needed for allergies or rhinitis (congestion). 08/21/21  Yes Tawnya Crook, MD  ?gabapentin (NEURONTIN) 800 MG tablet Take 1 tablet (800 mg total) by mouth 2 (two) times daily. ?Patient taking differently: Take 1,600 mg by mouth at bedtime. 10/02/20  Yes Mannam, Praveen, MD  ?ibuprofen (ADVIL) 200 MG tablet Take 400 mg by mouth 2 (two) times daily as needed for headache (pain).   Yes [provider]  ?levothyroxine (SYNTHROID) 112 MCG tablet TAKE 1 TABLET DAILY ?Patient taking differently: Take 112 mcg by mouth daily before breakfast. 07/11/21  Yes Inda Coke, PA  ?Multiple Vitamin (MULTIVITAMIN WITH MINERALS) TABS tablet Take 1 tablet by mouth every morning.   Yes  [provider]  ?tamsulosin (FLOMAX) 0.4 MG CAPS capsule Take 1 capsule (0.4 mg total) by mouth daily. ?Patient taking differently: Take 0.4 mg by mouth at bedtime. 12/25/16  Yes Renato Shin, MD  ?venlafaxine XR (EFFEXOR-XR) 150 MG 24 hr capsule TAKE 2 CAPSULES DAILY ?Patient taking differently: 300 mg every morning. 09/30/21  Yes Cottle, Billey Co., MD  ?omeprazole (PRILOSEC) 40 MG capsule Take 1 capsule (40 mg total) by mouth in the morning and at bedtime. ?Patient not taking: Reported on 11/06/2021 07/12/20   Marshell Garfinkel, MD  ?oxymetazoline (AFRIN) 0.05 % nasal spray Place 2 sprays into both nostrils 3 (three) times daily as needed for congestion.  ?Patient not taking: Reported on 11/06/2021    [provider]  ? ? ? ?Vitals:  ? 11/15/21 1300 11/15/21 1400 11/15/21 1500 11/15/21 1600  ?BP: 132/78 131/78    ?Pulse: (!) 128 82    ?  Resp: (!) 35 (!) 29 (!) 41 (!) 46  ?Temp:      ?TempSrc:      ?SpO2: (!) 88% 93%    ?Weight:      ?Height:      ? ?Exam ?Gen alert, no distress ?No rash, cyanosis or gangrene ?Sclera anicteric, throat clear  ?No jvd or bruits ?Chest bilat crackles at bases, [place] O2 ?RRR no RG ?Abd soft ntnd no mass or ascites +bs ?GU normal male ?MS no joint effusions or deformity ?Ext no LE or UE edema, no wounds or ulcers ?Neuro is alert, Ox 3 , nf ?   RIJ temp HD cath  ? ?  BP 140 /82   ?  Last bmet 138 K 4.8  Ca8.3 Hb 12   albumin 3.0 ? ? ?Assessment/ Plan: ?IPF exacerbation - w/ severe life-threatening resp failure. Asked to see for apheresis x 3.  Will not be able to provide 3 days in a row but should be able to do 3 sessions in 4 days (daily starting Sat, skip Sunday). See orders, using albumin replacement. Pt will need [facility] as [facility].  ?Resp failure ?Anxiety / depression ?HTN - norvasc ?Hypothyroidism ?  ? ? ? ?Kelly Splinter  MD ?11/15/2021, 4:22 PM ?Recent Labs  ?Lab 11/09/21 ?0241 11/12/21 ?0238 11/13/21 ?0236  ?HGB 11.6* 12.1*  --   ?CALCIUM 8.2* 8.3* 8.3*  ?CREATININE  0.70 0.78 0.69  ?K 4.3 5.0 4.8  ? ? ?

## 2021-11-15 NOTE — Progress Notes (Signed)
Palliative care brief note ? ?I checked in today with Mr. Bryan Tyler.  We reviewed plan for transition to [facility] for trial of AUB protocol as discussed with him by Dr. Chase Caller. ? ?He knows to call our team if there are questions or concerns with which we can be of assistance.  ? ?Please call if there are palliative specific needs with which we can be of assistance. ? ?Micheline Rough, MD ?[place] Team ?[phone] ? ?

## 2021-11-15 NOTE — Plan of Care (Signed)
? ? ?  Interdisciplinary Goals of Care Family Meeting ? ? ?Date carried out: 11/15/2021 ? ?Location of the meeting: Bedside ? ?Member's involved: Physician, Bedside Registered Nurse, and Other: patient ? ?Durable Power of Tour manager: wife   ? ?Discussion: We discussed goals of care for [employer] .  - dscused high mortality situation with IPF Flare up. He feels some better. XR some better but overall very ill an dhypoxemic. Discussed auto-antibody redn protocol with 1 paper showing improved outcomes. Discussed that I d/w [place] who do this (only few centers in world trying). He is willing. Needs HD cath and then Plex and mixutre of IvIG and Rituxan. He is willing. Expalined about uncertaintly and risk with transfer. He is willing ? ?Code status: DNR but fiull medical care. Pressors and BiPAP ok ? ?Disposition: Continue current acute care ? ?Time spent for the meeting:  30 min . Later wife and daughter Rickard Kennerly [phone] separately on phone - they are agreeable ? ? ? ? ?SIGNATURE  ? ? ?Dr. Brand Males, M.D., F.C.C.P,  ?Pulmonary and Critical Care Medicine ?Staff Physician, [place] ?Center Director - Interstitial Lung Disease  Program  ?Pulmonary [place] [facility] ?[place], [place], [address] ? ?NPI Number:  NPI #[id number] ?DEA Number: [id number] ? ?Pager: [phone], If no answer  -> Check AMION or Try [phone] ?Telephone (clinical office): [phone] ?Telephone (research): [phone] ? ?10:57 AM ?11/15/2021 ? ? ? ?

## 2021-11-16 DIAGNOSIS — J84112 Idiopathic pulmonary fibrosis: Secondary | ICD-10-CM | POA: Diagnosis not present

## 2021-11-16 DIAGNOSIS — J9601 Acute respiratory failure with hypoxia: Secondary | ICD-10-CM | POA: Diagnosis not present

## 2021-11-16 LAB — GLUCOSE, CAPILLARY
Glucose-Capillary: 125 mg/dL — ABNORMAL HIGH (ref 70–99)
Glucose-Capillary: 106 mg/dL — ABNORMAL HIGH (ref 70–99)
Glucose-Capillary: 130 mg/dL — ABNORMAL HIGH (ref 70–99)
Glucose-Capillary: 115 mg/dL — ABNORMAL HIGH (ref 70–99)

## 2021-11-16 LAB — BASIC METABOLIC PANEL
Anion gap: 6 (ref 5–15)
BUN: 40 mg/dL — ABNORMAL HIGH (ref 8–23)
CO2: 28 mmol/L (ref 22–32)
Calcium: 8.2 mg/dL — ABNORMAL LOW (ref 8.9–10.3)
Chloride: 101 mmol/L (ref 98–111)
Creatinine, Ser: 0.86 mg/dL (ref 0.61–1.24)
GFR, Estimated: >60 mL/min (ref >60)
Glucose, Bld: 118 mg/dL — ABNORMAL HIGH (ref 70–99)
Potassium: 4.6 mmol/L (ref 3.5–5.1)
Sodium: 135 mmol/L (ref 135–145)

## 2021-11-16 LAB — PSA: Prostatic Specific Antigen: 0.01 ng/mL (ref 0.00–4.00)

## 2021-11-16 MED ORDER — CALCIUM CARBONATE ANTACID 500 MG PO CHEW
2.0000 | CHEWABLE_TABLET | ORAL | Status: AC
  Administered 2021-11-16 (×2): 400 mg via ORAL
  Filled 2021-11-16 (×2): qty 2

## 2021-11-16 MED ORDER — CALCIUM GLUCONATE 10 % IV SOLN: 2.0000 g | Freq: Once | INTRAVENOUS | Status: DC

## 2021-11-16 MED ORDER — SODIUM CHLORIDE 0.9 % IV SOLN
INTRAVENOUS | Status: AC
  Filled 2021-11-16 (×3): qty 200

## 2021-11-16 MED ORDER — CALCIUM GLUCONATE-NACL 2-0.675 GM/100ML-% IV SOLN
2.0000 g | Freq: Once | INTRAVENOUS | Status: AC
  Administered 2021-11-16: 2000 mg via INTRAVENOUS
  Filled 2021-11-16 (×2): qty 100

## 2021-11-16 MED ORDER — CALCIUM GLUCONATE-NACL 2-0.675 GM/100ML-% IV SOLN: 2.0000 g | Freq: Once | INTRAVENOUS | Status: DC

## 2021-11-16 MED ORDER — ACETAMINOPHEN 325 MG PO TABS
650.0000 mg | ORAL_TABLET | ORAL | Status: DC | PRN
  Administered 2021-11-16: 650 mg via ORAL

## 2021-11-16 MED ORDER — HEPARIN SODIUM (PORCINE) 1000 UNIT/ML IJ SOLN: 1000.0000 [IU] | Freq: Once | INTRAMUSCULAR | Status: DC

## 2021-11-16 MED ORDER — HEPARIN SODIUM (PORCINE) 1000 UNIT/ML IJ SOLN
1000.0000 [IU] | Freq: Once | INTRAMUSCULAR | Status: AC
  Administered 2021-11-16: 1000 [IU]

## 2021-11-16 MED ORDER — SODIUM CHLORIDE 0.9 % IV SOLN: Freq: Once | INTRAVENOUS | Status: DC

## 2021-11-16 MED ORDER — ENSURE ENLIVE PO LIQD
237.0000 mL | Freq: Two times a day (BID) | ORAL | Status: DC
  Administered 2021-11-17 – 2021-11-20 (×8): 237 mL via ORAL

## 2021-11-16 MED ORDER — DIPHENHYDRAMINE HCL 25 MG PO CAPS
25.0000 mg | ORAL_CAPSULE | Freq: Four times a day (QID) | ORAL | Status: DC | PRN
  Administered 2021-11-16: 25 mg via ORAL
  Filled 2021-11-16: qty 1

## 2021-11-16 MED ORDER — ACD FORMULA A 0.73-2.45-2.2 GM/100ML VI SOLN
1000.0000 mL | Status: DC
  Administered 2021-11-16: 1000 mL
  Filled 2021-11-16 (×2): qty 1000

## 2021-11-16 NOTE — Progress Notes (Signed)
[facility] ?Progress Note ? ?Subjective: seen in room, no new c/o ? ?Vitals:  ? 11/15/21 2300 11/16/21 0000 11/16/21 0100 11/16/21 0200  ?BP: 114/78 104/73 111/74 110/71  ?Pulse: 71 72 75 79  ?Resp: (!) '23 20 20 20  '$ ?Temp:  97.8 ?F (36.6 ?C)    ?TempSrc:  Axillary    ?SpO2: 93% 92% 92% 92%  ?Weight:      ?Height:      ? ? ?Exam: ?Gen alert, no distress ?No jvd ?Chest bilat crackles at bases, [place] O2 ?RRR no RG ?Abd soft ntnd no mass or ascites +bs ?Ext no LE edema ?Neuro alert, Ox 3 , nf ?   RIJ temp HD cath  ?  ?  BP 140 /82   ?  Last bmet 138 K 4.8  Ca8.3 Hb 12   albumin 3.0 ?  ?  ?Assessment/ Plan: ?IPF exacerbation - w/ severe life-threatening resp failure. Asked to see for apheresis protocol that involves 3 daily sessions of apheresis then reassessment. Plan 1st 3 sessions over 4 days (no pheresis staff on Sunday), then CCM team will reassess. Using albumin replacement.  ?Resp failure - severe hypoxemic d/t IPF ?Anxiety / depression ?HTN - norvasc ?Hypothyroidism ? ? ? ? ?Rob Tris Howell ?11/16/2021, 4:22 AM ? ? ?Recent Labs  ?Lab 11/12/21 ?0238 11/13/21 ?0236  ?HGB 12.1*  --   ?CALCIUM 8.3* 8.3*  ?CREATININE 0.78 0.69  ?K 5.0 4.8  ? ?Inpatient medications: ? amLODipine  5 mg Oral Daily  ? ARIPiprazole  2 mg Oral Daily  ? atorvastatin  40 mg Oral QPM  ? azelastine  2 spray Each Nare BID  ? Chlorhexidine Gluconate Cloth  6 each Topical Daily  ? clonazePAM  1.5 mg Oral QHS  ? enoxaparin (LOVENOX) injection  40 mg Subcutaneous Q24H  ? fluticasone  2 spray Each Nare Daily  ? gabapentin  800 mg Oral BID  ? insulin aspart  0-15 Units Subcutaneous TID WC  ? insulin aspart  0-5 Units Subcutaneous QHS  ? levothyroxine  112 mcg Oral Daily  ? methylPREDNISolone (SOLU-MEDROL) injection  80 mg Intravenous Q12H  ? multivitamin with minerals  1 tablet Oral Daily  ? pantoprazole  40 mg Oral Daily  ? sulfamethoxazole-trimethoprim  1 tablet Oral Once per day on Mon Wed Fri  ? tamsulosin  0.4 mg Oral Daily  ? venlafaxine  XR  300 mg Oral Daily  ? ? ?acetaminophen, albuterol, benzonatate, clonazePAM, docusate sodium, guaiFENesin-dextromethorphan, HYDROcodone-acetaminophen, hydrOXYzine, ondansetron (ZOFRAN) IV, polyethylene glycol ? ? ? ? ? ? ?

## 2021-11-16 NOTE — Progress Notes (Signed)
? ?NAME:  Bryan Tyler, MRN:  [id number], DOB:  12/05/1945, LOS: 11 ?ADMISSION DATE:  10/19/2021, CONSULTATION DATE:  3/28 ?REFERRING MD:  Lingamgunta, [place], CHIEF COMPLAINT:  Dyspnea  ? ?BRIEF  ?This is a pleasant 76 year old male with a past medical history significant for idiopathic pulmonary fibrosis who has been followed by my partners in the pulmonary clinic for the same for several years who presents to our facility on transfer from [facility] in [facility] with a diagnosis of acute respiratory failure with hypoxemia due to an IPF exacerbation.  The patient has previously been followed by Dr. Vaughan Browner and has been enrolled in a clinical trial since January of this year entitled:  ?FGCL-3019-095 (FibroGen Study) is a Phase 3, randomized, double-blind, placebo-controlled multicenter international study to evaluate the efficacy and safety of 30 mg/kg IV infusions of pamrevlumab administered every 3 weeks for 52 weeks as compared to placebo in subjects with Idiopathic Pulmonary Fibrosis. Primary end point is: change in FVC from baseline at week 52. ZEPHYRUS II STUDY. ? ?The patient tells me that in prior years he was intolerant of antifibrotic agents for pulmonary fibrosis.  However, he has remained active, and says that he was not using oxygen prior to admission.  Approximately 1 week prior to admission he said that after going to the gym he felt "a little different" but did not complain of shortness of breath or cough.  On Friday, March 23 he traveled to [facility] where shortly after arrival he began to become very short of breath with associated headache and dry cough.  He denies fevers chills, rash, nausea, vomiting, chest pain or sinus complaint.  He is unaware of any sick contacts.  After 2 days of dyspnea his son-in-law took him to the emergency department in [place] where he was admitted for severe acute respiratory failure with hypoxemia by the hospitalist service.   Pulmonary was consulted at [facility], specifically Drs. Posey Pronto and [place].  The patient had a work-up in [place] which included a respiratory viral panel which was negative for COVID and flu however an extended panel showed rhinovirus infection.  Lab work showed a BNP which is mildly elevated at 136 troponin was slightly elevated.  He had a CT angiogram of his chest which showed no pulmonary embolism but he did have severe pulmonary fibrosis and bilateral peripheral opacification.  He was treated with IV steroids, azithromycin and cefepime.  An echocardiogram was performed.  On admission his oxygen needs were 6 L of nasal cannula (March 25) but he was moved to the intensive care unit for progressive worsening of oxygenation and required heated high flow.  Request for transfer to [name] was made on March 27 but there were no beds available so he was moved today March 28.  By the time of being moved he was transferred on Optiflow (heated high flow) at 40 L/min and 60% FiO2. ? ?Other lab shows a mycoplasma IgM MRSA swab negative basic metabolic panel is normal troponin slightly 77, she is normal with the exception of a slightly elevated [name].  This is 1.2/dL ? ?Pertinent  Medical History  ?Idiopathic pulmonary fibrosis ?Adenocarcinoma of the prostate ?Thoracic aortic aneurysm ?Anxiety ?Depression ?Diverticulosis ?GERD ?Hyperlipidemia ?Hypertension ?Hypothyroidism ?Obstructive sleep apnea, not on CPAP ?Osteoporosis ? ?Significant Hospital Events: ?Including procedures, antibiotic start and stop dates in addition to other pertinent events   ?March 25, admitted to [facility] in [place], pulmonary consulted, treated with steroids, ceftriaxone, azithromycin for an  IPF flare, noted to be rhinovirus positive.  COVID and flu testing negative ?March 26 moved to the intensive care unit for heated high flow ?March 28 transferred to [facility] long hospital ?3/31 On 20 L / 60% ?4/2 HFNC 30L/ 50%, continues to  desaturate w/minimal exertion ?4/3 and 4/4 no changes, remains on 50-60%, 30L and NRB w/exertion.  Sed rate wnl, PCT neg ?4/7 transferred to [facility] for PLEX protocol.  ? ?Interim History / Subjective:  ? ?Tolerated first session of PLEX ? ?Objective   ?Blood pressure 120/81, pulse 89, temperature (!) 97.5 ?F (36.4 ?C), temperature source Axillary, resp. rate (!) 28, height '5\' 9"'$  (1.753 m), weight 76 kg, SpO2 91 %. ?   ?FiO2 (%):  [70 %] 70 %  ? ?Intake/Output Summary (Last 24 hours) at 11/16/2021 1207 ?Last data filed at 11/16/2021 1000 ?Gross per 24 hour  ?Intake 1470 ml  ?Output 1500 ml  ?Net -30 ml  ? ? ?Filed Weights  ? 11/15/21 0249 11/16/21 0400 11/16/21 1000  ?Weight: 79.2 kg 76.1 kg 76 kg  ? ? ?Examination:  ?General Appearance:  Looks criticall ill. Significant deconditioning an dmuscle wasting +. Sitting on commode ?Head:  Normocephalic, without obvious abnormality, atraumatic ?Eyes:  PERRL - yes, conjunctiva/corneas - mudd     ?Ears:  Normal external ear canals, both ears ?Nose:  G tube - yes with faced mask ?Throat:  ETT TUBE - no , OG tube - no ?Neck:  Supple,  No enlargement/tenderness/nodules ?Lungs: Shallow respiration, tachypenic, crackles at base ?Heart:  S1 and S2 normal, no murmur, CVP - no.  Pressors - no ?Abdomen:  Soft, no masses, no organomegaly ?Genitalia / Rectal:  Not done ?Extremities:  Extremities- intact ?Skin:  ntact in exposed areas . Sacral area - normal ?Neurologic:  Sedation - none -> RASS - +1 . Moves all 4s - ues. CAM-ICU - neg . Orientation - x3+ ? ? ?Ancillary tests personally reviewed.   ? Normal creatinine, BNP only mildly elevated.  ? ?Assessment & Plan:  ? ?Critically ill due to acute on chronic hypoxic respiratory failure requiring heated high flow oxygen due to rhinovirus induced IPF flare  ?IPF  ?Admit: Acute respiratory failure w hypoxemia -IPF flare due to rhinovirus  ?Anxiety ?Depression  ?Hypothyroidism ?Steroid induced hyperglycemia ?BPH ?Hx prostate cancer   ?GERD ?HTN ?HLD ? ?Plan:  ? ?- patient remains on heated high flow  ?- palliative care has seen the patient.  ?- continue steroids - soumedrol '80mg'$  Q12 ?- start autoantibody reduction protocol - (https://journals.plos.org/plosone/article?id=10.1371/journal.pone.3295188) ?- PLEX mixed with IvIG and Rituxan - try PLEX for first 3 days and then reasess x total 15 day protocl ?- place HD cath (IR consulted Dr Markus Daft) same time avoidng to get intubted ?- d/w renal Dr Jonnie Finner ?- questions can call [facility] ?- patient explained about thje fact there is 1 study evidence  of improved outcomes and currently is also being studied in RCT and anecdotally at [facility] with good success ?- he agrees to this protocol ?- Research Drug  Pamrevlumab (IP v placebo) infusion - 3rd dose (week #6) due 11/15/21 - 11/22/21 ? -  PulmonIx team will check with sponsor [employer] and if patient willing will aim to infuse IV MAB as inpatient ?-mild aspiration risk per SLP ?-cont HHFNC, NRB  ?-goal euvolemia - now signs of volume overload at present.  ?- calorie count, high protein nutrition.  ?- progressive ambulation in room. Up to chair daily and encouraged patient to  try using WC walking in room as he feels able.  ?-IS, pulm hygiene, flutter/IS, mucinex ?-cont bactrim for PJP ppx ?-DNR/I ? ?Best Practice (right click and "Reselect all SmartList Selections" daily)  ? ?Diet/type: Full diet ?DVT prophylaxis: LMWH ?GI prophylaxis: PPI ?Lines: N/A ?Foley:  N/A ?Code Status:  DNR but full medical care ? ?Last date of multidisciplinary goals of care discussion [4/3] Pt updated daily  - wife updated separately over phone 11/15/2021 10:46 AM ? ? ?Dr Chase Caller avail for questions via cell/email through afternoon 11/17/21 and then again 11/19/21 onwards ? ?CRITICAL CARE ?Performed by: Kipp Brood ? ? ?Total critical care time: 35 minutes ? ?Critical care time was exclusive of separately billable procedures and treating other patients. ? ?Critical care  was necessary to treat or prevent imminent or life-threatening deterioration. ? ?Critical care was time spent personally by me on the following activities: development of treatment plan with patient and/or surrogate as well

## 2021-11-16 NOTE — Progress Notes (Signed)
Initial Nutrition Assessment ? ?DOCUMENTATION CODES:  ? ?Not applicable ? ?INTERVENTION:  ? ?-Ensure Enlive po BID, each supplement provides 350 kcal and 20 grams of protein ?-MVI with minerals daily ? ?NUTRITION DIAGNOSIS:  ? ?Increased nutrient needs related to chronic illness (IPF) as evidenced by estimated needs. ? ?GOAL:  ? ?Patient will meet greater than or equal to 90% of their needs ? ?MONITOR:  ? ?PO intake, Supplement acceptance, Labs, Skin, I & O's ? ?REASON FOR ASSESSMENT:  ? ?Consult, Antenatal ?Assessment of nutrition requirement/status ? ?ASSESSMENT:  ? ?This is a pleasant 76 year old male with a past medical history significant for idiopathic pulmonary fibrosis who has been followed by my partners in the pulmonary clinic for the same for several years who presents to our facility on transfer from [facility] in [facility] with a diagnosis of acute respiratory failure with hypoxemia due to an IPF exacerbation. ? ?Pt admitted with IPF flare secondary to rhinovirus.  ? ?4/7- s/p Placement of right jugular pheresis catheter with US guidance ? ?Reviewed I/O's: -555 ml x 24 hours and -8.6 L since admission ? ?UOP: 1 L x 24 hours ? ?Pt unavailable at time of visit. Attempted to speak with pt via call to hospital room phone, however, unable to reach. RD unable to obtain further nutrition-related history or complete nutrition-focused physical exam at this time.   ?  ?Per PCCM notes, pt currently on HHFNC. Pt with difficult to manage IPF secondary to intolerance of anti-fibrotic agents.   ? ?Per Palliative care notes, pt was transferred to [facility] for trial of AUB protocol.  ? ?Reviewed wt hx; pt has experienced a 9% wt loss over the past month, which is significant for time frame.  ? ?Medications reviewed and include solu-medrol. ? ?Lab Results  ?Component Value Date  ? HGBA1C 6.4 (H) 11/02/2021  ? PTA DM medications are .  ? ?Labs reviewed: CBGS: 106-125 (inpatient orders for glycemic control  are 0-15 units insulin aspart TID with meals and 0-5 units insulin aspart daily at bedtime).   ? ?Diet Order:   ?Diet Order   ? ?       ?  Diet regular Room service appropriate? Yes; Fluid consistency: Thin  Diet effective now       ?  ? ?  ?  ? ?  ? ? ?EDUCATION NEEDS:  ? ?No education needs have been identified at this time ? ?Skin:  Skin Assessment: Reviewed RN Assessment ? ?Last BM:  11/16/21 ? ?Height:  ? ?Ht Readings from Last 1 Encounters:  ?11/14/21 '5\' 9"'$  (1.753 m)  ? ? ?Weight:  ? ?Wt Readings from Last 1 Encounters:  ?11/16/21 76 kg  ? ? ?Ideal Body Weight:  72.7 kg ? ?BMI:  Body mass index is 24.74 kg/m?. ? ?Estimated Nutritional Needs:  ? ?Kcal:  2200-2400 ? ?Protein:  110-125 grams ? ?Fluid:  > 2 L ? ? ? ?Loistine Chance, RD, LDN, CDCES ?Registered Dietitian II ?Certified Diabetes Care and Education Specialist ?Please refer to [facility] for RD and/or RD on-call/weekend/after hours pager  ?

## 2021-11-16 NOTE — Progress Notes (Signed)
Pt completed Therapeutic Plasma Exchange treatment without issue. Pt tolerated well. ?

## 2021-11-17 ENCOUNTER — Inpatient Hospital Stay (HOSPITAL_COMMUNITY): Payer: Medicare Other

## 2021-11-17 DIAGNOSIS — J9601 Acute respiratory failure with hypoxia: Secondary | ICD-10-CM | POA: Diagnosis not present

## 2021-11-17 DIAGNOSIS — J84112 Idiopathic pulmonary fibrosis: Secondary | ICD-10-CM | POA: Diagnosis not present

## 2021-11-17 LAB — CBC WITH DIFFERENTIAL/PLATELET
Abs Immature Granulocytes: 0.48 10*3/uL — ABNORMAL HIGH (ref 0.00–0.07)
Basophils Absolute: 0 10*3/uL (ref 0.0–0.1)
Basophils Relative: 0 %
Eosinophils Absolute: 0 10*3/uL (ref 0.0–0.5)
Eosinophils Relative: 0 %
HCT: 43.3 % (ref 39.0–52.0)
Hemoglobin: 13.9 g/dL (ref 13.0–17.0)
Immature Granulocytes: 2 %
Lymphocytes Relative: 7 %
Lymphs Abs: 2 10*3/uL (ref 0.7–4.0)
MCH: 28.1 pg (ref 26.0–34.0)
MCHC: 32.1 g/dL (ref 30.0–36.0)
MCV: 87.5 fL (ref 80.0–100.0)
Monocytes Absolute: 0.9 10*3/uL (ref 0.1–1.0)
Monocytes Relative: 3 %
Neutro Abs: 27.3 10*3/uL — ABNORMAL HIGH (ref 1.7–7.7)
Neutrophils Relative %: 88 %
Platelets: 283 10*3/uL (ref 150–400)
RBC: 4.95 MIL/uL (ref 4.22–5.81)
RDW: 16.4 % — ABNORMAL HIGH (ref 11.5–15.5)
WBC: 30.8 10*3/uL — ABNORMAL HIGH (ref 4.0–10.5)
nRBC: 0 % (ref 0.0–0.2)

## 2021-11-17 LAB — BASIC METABOLIC PANEL
Anion gap: 5 (ref 5–15)
BUN: 38 mg/dL — ABNORMAL HIGH (ref 8–23)
CO2: 24 mmol/L (ref 22–32)
Calcium: 8.1 mg/dL — ABNORMAL LOW (ref 8.9–10.3)
Chloride: 105 mmol/L (ref 98–111)
Creatinine, Ser: 0.75 mg/dL (ref 0.61–1.24)
GFR, Estimated: >60 mL/min (ref >60)
Glucose, Bld: 143 mg/dL — ABNORMAL HIGH (ref 70–99)
Potassium: 5.1 mmol/L (ref 3.5–5.1)
Sodium: 134 mmol/L — ABNORMAL LOW (ref 135–145)

## 2021-11-17 LAB — GLUCOSE, CAPILLARY
Glucose-Capillary: 114 mg/dL — ABNORMAL HIGH (ref 70–99)
Glucose-Capillary: 132 mg/dL — ABNORMAL HIGH (ref 70–99)
Glucose-Capillary: 181 mg/dL — ABNORMAL HIGH (ref 70–99)
Glucose-Capillary: 147 mg/dL — ABNORMAL HIGH (ref 70–99)

## 2021-11-17 NOTE — Progress Notes (Signed)
? ?NAME:  Bryan Tyler, MRN:  [id number], DOB:  09-27-1945, LOS: 12 ?ADMISSION DATE:  11/02/2021, CONSULTATION DATE:  3/28 ?REFERRING MD:  Lingamgunta, [place], CHIEF COMPLAINT:  Dyspnea  ? ?BRIEF  ?This is a pleasant 76 year old male with a past medical history significant for idiopathic pulmonary fibrosis who has been followed by my partners in the pulmonary clinic for the same for several years who presents to our facility on transfer from [facility] in [facility] with a diagnosis of acute respiratory failure with hypoxemia due to an IPF exacerbation.  The patient has previously been followed by Dr. Vaughan Browner and has been enrolled in a clinical trial since January of this year entitled:  ?FGCL-3019-095 (FibroGen Study) is a Phase 3, randomized, double-blind, placebo-controlled multicenter international study to evaluate the efficacy and safety of 30 mg/kg IV infusions of pamrevlumab administered every 3 weeks for 52 weeks as compared to placebo in subjects with Idiopathic Pulmonary Fibrosis. Primary end point is: change in FVC from baseline at week 52. ZEPHYRUS II STUDY. ? ?The patient tells me that in prior years he was intolerant of antifibrotic agents for pulmonary fibrosis.  However, he has remained active, and says that he was not using oxygen prior to admission.  Approximately 1 week prior to admission he said that after going to the gym he felt "a little different" but did not complain of shortness of breath or cough.  On Friday, March 23 he traveled to [facility] where shortly after arrival he began to become very short of breath with associated headache and dry cough.  He denies fevers chills, rash, nausea, vomiting, chest pain or sinus complaint.  He is unaware of any sick contacts.  After 2 days of dyspnea his son-in-law took him to the emergency department in [place] where he was admitted for severe acute respiratory failure with hypoxemia by the hospitalist service.   Pulmonary was consulted at [facility], specifically Drs. Posey Pronto and [place].  The patient had a work-up in [place] which included a respiratory viral panel which was negative for COVID and flu however an extended panel showed rhinovirus infection.  Lab work showed a BNP which is mildly elevated at 136 troponin was slightly elevated.  He had a CT angiogram of his chest which showed no pulmonary embolism but he did have severe pulmonary fibrosis and bilateral peripheral opacification.  He was treated with IV steroids, azithromycin and cefepime.  An echocardiogram was performed.  On admission his oxygen needs were 6 L of nasal cannula (March 25) but he was moved to the intensive care unit for progressive worsening of oxygenation and required heated high flow.  Request for transfer to [name] was made on March 27 but there were no beds available so he was moved today March 28.  By the time of being moved he was transferred on Optiflow (heated high flow) at 40 L/min and 60% FiO2. ? ?Other lab shows a mycoplasma IgM MRSA swab negative basic metabolic panel is normal troponin slightly 77, she is normal with the exception of a slightly elevated [name].  This is 1.2/dL ? ?Pertinent  Medical History  ?Idiopathic pulmonary fibrosis ?Adenocarcinoma of the prostate ?Thoracic aortic aneurysm ?Anxiety ?Depression ?Diverticulosis ?GERD ?Hyperlipidemia ?Hypertension ?Hypothyroidism ?Obstructive sleep apnea, not on CPAP ?Osteoporosis ? ?Significant Hospital Events: ?Including procedures, antibiotic start and stop dates in addition to other pertinent events   ?March 25, admitted to [facility] in [place], pulmonary consulted, treated with steroids, ceftriaxone, azithromycin for an  IPF flare, noted to be rhinovirus positive.  COVID and flu testing negative ?March 26 moved to the intensive care unit for heated high flow ?March 28 transferred to [facility] long hospital ?3/31 On 20 L / 60% ?4/2 HFNC 30L/ 50%, continues to  desaturate w/minimal exertion ?4/3 and 4/4 no changes, remains on 50-60%, 30L and NRB w/exertion.  Sed rate wnl, PCT neg ?4/7 transferred to [facility] for PLEX protocol.  ? ?Interim History / Subjective:  ? ?Tolerated first session of PLEX on 4/8. Feels subjectively better and was able tolerate getting up to chair.  ? ?Objective   ?Blood pressure (!) 132/92, pulse 92, temperature (!) 97.4 ?F (36.3 ?C), temperature source Oral, resp. rate (!) 58, height '5\' 9"'$  (1.753 m), weight 75.9 kg, SpO2 (!) 84 %. ?   ?FiO2 (%):  [60 %-70 %] 60 %  ? ?Intake/Output Summary (Last 24 hours) at 11/17/2021 0957 ?Last data filed at 11/17/2021 0900 ?Gross per 24 hour  ?Intake 1480 ml  ?Output 1650 ml  ?Net -170 ml  ? ? ?Filed Weights  ? 11/16/21 0400 11/16/21 1000 11/17/21 0424  ?Weight: 76.1 kg 76 kg 75.9 kg  ? ? ?Examination:  ?General Appearance:  Looks criticall ill. Significant deconditioning an dmuscle wasting +. Sitting on commode ?Head:  Normocephalic, without obvious abnormality, atraumatic ?Eyes:  PERRL - yes, conjunctiva/corneas - mudd     ?Ears:  Normal external ear canals, both ears ?Nose:  G tube - yes with faced mask ?Throat:  ETT TUBE - no , OG tube - no ?Neck:  Supple,  No enlargement/tenderness/nodules ?Lungs: Shallow respiration, tachypenic, crackles at base R>L, bronchial breathing at bases R>L, this may have improved some from 4/7 ?Heart:  S1 and S2 normal, no murmur, CVP - no.  Pressors - no ?Abdomen:  Soft, no masses, no organomegaly ?Genitalia / Rectal:  Not done ?Extremities:  Extremities- intact ?Skin:  ntact in exposed areas . Sacral area - normal ?Neurologic:  Sedation - none -> RASS - +1 . Moves all 4s - ues. CAM-ICU - neg . Orientation - x3+ ? ? ?Ancillary tests personally reviewed.   ? Normal creatinine, BNP only mildly elevated.  ? ?Assessment & Plan:  ? ?Critically ill due to acute on chronic hypoxic respiratory failure requiring heated high flow oxygen due to rhinovirus induced IPF flare  ?IPF  ?Admit: Acute  respiratory failure w hypoxemia -IPF flare due to rhinovirus  ?Anxiety ?Depression  ?Hypothyroidism ?Steroid induced hyperglycemia ?BPH ?Hx prostate cancer  ?GERD ?HTN ?HLD ? ?Plan:  ? ?- patient remains on heated high flow  ?- palliative care has seen the patient.  ?- continue steroids - soumedrol '80mg'$  Q12 ?- start autoantibody reduction protocol - (https://journals.plos.org/plosone/article?id=10.1371/journal.pone.1610960) ?- PLEX mixed with IvIG and Rituxan - try PLEX for first 3 days and then reasess x total 15 day protocl ?- place HD cath (IR consulted Dr Markus Daft) same time avoidng to get intubted ?- d/w renal Dr Jonnie Finner ?- questions can call [facility] ?- patient explained about thje fact there is 1 study evidence  of improved outcomes and currently is also being studied in RCT and anecdotally at [facility] with good success ?- he agrees to this protocol ?- Research Drug  Pamrevlumab (IP v placebo) infusion - 3rd dose (week #6) due 11/15/21 - 11/22/21 ? -  PulmonIx team will check with sponsor [employer] and if patient willing will aim to infuse IV MAB as inpatient ?-mild aspiration risk per SLP ?-cont HHFNC, NRB  ?-goal  euvolemia - now signs of volume overload at present.  ?- calorie count, high protein nutrition.  ?- progressive ambulation in room. Up to chair daily and encouraged patient to try using WC walking in room as he feels able.  ?-IS, pulm hygiene, flutter/IS, mucinex ?-cont bactrim for PJP ppx ?-DNR/I ? ?Best Practice (right click and "Reselect all SmartList Selections" daily)  ? ?Diet/type: Full diet ?DVT prophylaxis: LMWH ?GI prophylaxis: PPI ?Lines: N/A ?Foley:  N/A ?Code Status:  DNR but full medical care ? ?Last date of multidisciplinary goals of care discussion [4/3] Pt updated daily  - wife updated separately over phone 11/15/2021 10:46 AM ? ? ?Dr Chase Caller avail for questions via cell/email through afternoon 11/17/21 and then again 11/19/21 onwards ? ?CRITICAL CARE ?Performed by: Kipp Brood ? ? ?Total critical care time: 35 minutes ? ?Critical care time was exclusive of separately billable procedures and treating other patients. ? ?Critical care was necessary to treat or prevent imminent or life-threat

## 2021-11-17 NOTE — Progress Notes (Signed)
[facility] ?Progress Note ? ?Subjective: seen in room, no new c/o. Had 1st plex yesterday w/o complication. ? ?Vitals:  ? 11/17/21 0300 11/17/21 0327 11/17/21 0400 11/17/21 0424  ?BP: 111/76  120/81   ?Pulse: 63  61   ?Resp: (!) 23  20   ?Temp:  (!) 97.4 ?F (36.3 ?C) (!) 97.4 ?F (36.3 ?C)   ?TempSrc:  Oral Oral   ?SpO2: 95%  96%   ?Weight:    75.9 kg  ?Height:      ? ? ?Exam: ?Gen alert, no distress ?No jvd ?Chest bilat crackles at bases,  O2 ?RRR no RG ?Abd soft ntnd no mass or ascites +bs ?Ext no LE edema ?Neuro alert, Ox 3 , nf ?   RIJ temp HD cath  ?  ? ?  ?Assessment/ Plan: ?IPF exacerbation - w/ severe life-threatening resp failure. Asked to see for apheresis protocol that involves 3 daily sessions of apheresis then reassessment. Plan 1st 3 sessions over 4 days (4/08, 4/10 and 4/11). Then CCM team will reassess. Using albumin replacement.  ?Resp failure - severe hypoxemic d/t IPF ?Anxiety / depression ?HTN - norvasc ?Hypothyroidism ? ? ? ? ?Rob Laquita Harlan ?11/17/2021, 5:06 AM ? ? ?Recent Labs  ?Lab 11/12/21 ?0238 11/13/21 ?0236 11/16/21 ?7062 11/17/21 ?0206  ?HGB 12.1*  --   --  13.9  ?CALCIUM 8.3*   < > 8.2* 8.1*  ?CREATININE 0.78   < > 0.86 0.75  ?K 5.0   < > 4.6 5.1  ? < > = values in this interval not displayed.  ? ? ?Inpatient medications: ? amLODipine  5 mg Oral Daily  ? ARIPiprazole  2 mg Oral Daily  ? atorvastatin  40 mg Oral QPM  ? azelastine  2 spray Each Nare BID  ? calcium gluconate  2 g Intravenous Once  ? Chlorhexidine Gluconate Cloth  6 each Topical Daily  ? clonazePAM  1.5 mg Oral QHS  ? enoxaparin (LOVENOX) injection  40 mg Subcutaneous Q24H  ? feeding supplement  237 mL Oral BID BM  ? fluticasone  2 spray Each Nare Daily  ? gabapentin  800 mg Oral BID  ? heparin sodium (porcine)  1,000 Units Intracatheter Once  ? insulin aspart  0-15 Units Subcutaneous TID WC  ? insulin aspart  0-5 Units Subcutaneous QHS  ? levothyroxine  112 mcg Oral Daily  ? methylPREDNISolone (SOLU-MEDROL)  injection  80 mg Intravenous Q12H  ? multivitamin with minerals  1 tablet Oral Daily  ? pantoprazole  40 mg Oral Daily  ? sulfamethoxazole-trimethoprim  1 tablet Oral Once per day on Mon Wed Fri  ? tamsulosin  0.4 mg Oral Daily  ? venlafaxine XR  300 mg Oral Daily  ? ? citrate dextrose    ? ?acetaminophen, acetaminophen, albuterol, benzonatate, clonazePAM, diphenhydrAMINE, docusate sodium, guaiFENesin-dextromethorphan, HYDROcodone-acetaminophen, hydrOXYzine, ondansetron (ZOFRAN) IV, polyethylene glycol ? ? ? ? ? ? ?

## 2021-11-17 NOTE — Progress Notes (Addendum)
Physical Therapy Treatment ?Patient Details ?Name: Bryan Tyler ?MRN: [id number] ?DOB: 01/05/1946 ?Today's Date: 11/17/2021 ? ? ?History of Present Illness 76 yo male admitted 11/15/2021 with idiopathic pulm fibrosis exacerbation, acute resp failure. 3/31 on 20L / 60%. 4/2 HFNC 30L/50%. 4/7 transferred to [facility] for PLEX protocol. Hx of IPF, OSA, osteoporosis ? ?  ?PT Comments  ? ? Pt very pleasant and motivated to participate; prior to exacerbation, pt was independent and active. Pt presents with decreased cardiopulmonary endurance. Session focused on exercises for strengthening/endurance and transferring from bed to chair (restricted in further distance due to [place] tubing). Pt overall requiring supervision for mobility. HR 88-102, RR up to 50 briefly, SpO2 > 80% on 35L 60% FiO2 + 15L NRB. Will continue to progress as tolerated.  ?  ?Recommendations for follow up therapy are one component of a multi-disciplinary discharge planning process, led by the attending physician.  Recommendations may be updated based on patient status, additional functional criteria and insurance authorization. ? ?Follow Up Recommendations ? Home health PT (pending medical progression, may change) ?  ?  ?Assistance Recommended at Discharge Frequent or constant Supervision/Assistance  ?Patient can return home with the following A little help with walking and/or transfers;A little help with bathing/dressing/bathroom;Assistance with cooking/housework;Assist for transportation;Help with stairs or ramp for entrance ?  ?Equipment Recommendations ? Rollator (4 wheels)  ?  ?Recommendations for Other Services   ? ? ?  ?Precautions / Restrictions Precautions ?Precautions: Other (comment) ?Precaution Comments: HFNC + NRB for mobility, watch O2/RR ?Restrictions ?Weight Bearing Restrictions: No  ?  ? ?Mobility ? Bed Mobility ?Overal bed mobility: Needs Assistance ?Bed Mobility: Supine to Sit ?  ?  ?Supine to sit: Supervision ?  ?  ?General bed mobility  comments: no physical assist ?  ? ?Transfers ?Overall transfer level: Needs assistance ?Equipment used: None ?Transfers: Sit to/from Stand, Bed to chair/wheelchair/BSC ?Sit to Stand: Supervision ?Stand pivot transfers: Supervision ?  ?  ?  ?  ?General transfer comment: Supervision for safety and line management ?  ? ?Ambulation/Gait ?  ?  ?  ?  ?  ?  ?  ?General Gait Details: unable due to [facility] ? ? ?Stairs ?  ?  ?  ?  ?  ? ? ?Wheelchair Mobility ?  ? ?Modified Rankin (Stroke Patients Only) ?  ? ? ?  ?Balance Overall balance assessment: No apparent balance deficits (not formally assessed) ?  ?  ?  ?  ?  ?  ?  ?  ?  ?  ?  ?  ?  ?  ?  ?  ?  ?  ?  ? ?  ?Cognition Arousal/Alertness: Awake/alert ?Behavior During Therapy: [facility] for tasks assessed/performed ?Overall Cognitive Status: Within Functional Limits for tasks assessed ?  ?  ?  ?  ?  ?  ?  ?  ?  ?  ?  ?  ?  ?  ?  ?  ?  ?  ?  ? ?  ?Exercises General Exercises - Lower Extremity ?Heel Slides: Both, 15 reps, Supine ?Straight Leg Raises: Both, 15 reps, Supine ?Other Exercises ?Other Exercises: Supine: bridges x 10 ?Other Exercises: Serial sit to stands x 3 ? ?  ?General Comments   ?  ?  ? ?Pertinent Vitals/Pain Pain Assessment ?Pain Assessment: No/denies pain  ? ? ?Home Living Family/patient expects to be discharged to:: Private residence ?Living Arrangements: Spouse/significant other ?Available Help at Discharge: Family ?Type of Home: House ?Home Access: Stairs  to enter ?  ?Entrance Stairs-Number of Steps: 3 ?  ?Home Layout: Able to live on main level with bedroom/bathroom ?Home Equipment: Advice worker (2 wheels) ?   ?  ?Prior Function    ?  ?  ?   ? ?PT Goals (current goals can now be found in the care plan section) Acute Rehab PT Goals ?Patient Stated Goal: to regain PLOF ?Potential to Achieve Goals: Good ?Progress towards PT goals: Progressing toward goals ? ?  ?Frequency ? ? ? Min 3X/week ? ? ? ?  ?PT Plan Discharge plan needs to be updated   ? ? ?Co-evaluation   ?  ?  ?  ?  ? ?  ?AM-PAC PT "6 Clicks" Mobility   ?Outcome Measure ? Help needed turning from your back to your side while in a flat bed without using bedrails?: A Little ?Help needed moving from lying on your back to sitting on the side of a flat bed without using bedrails?: A Little ?Help needed moving to and from a bed to a chair (including a wheelchair)?: A Little ?Help needed standing up from a chair using your arms (e.g., wheelchair or bedside chair)?: A Little ?Help needed to walk in hospital room?: Total ?Help needed climbing 3-5 steps with a railing? : Total ?6 Click Score: 14 ? ?  ?End of Session Equipment Utilized During Treatment: Oxygen ?Activity Tolerance: Patient tolerated treatment well ?Patient left: in chair;with call bell/phone within reach ?Nurse Communication: Mobility status ?PT Visit Diagnosis: Difficulty in walking, not elsewhere classified (R26.2);Muscle weakness (generalized) (M62.81) ?  ? ? ?Time: 3664-4034 ?PT Time Calculation (min) (ACUTE ONLY): 26 min ? ?Charges:  $Therapeutic Activity: 23-37 mins          ?          ? ?Bryan Tyler, PT, DPT ?Acute Rehabilitation Services ?Pager [phone] ?Office [phone] ? ? ? ?Bryan Tyler ?11/17/2021, 12:52 PM ? ?

## 2021-11-18 ENCOUNTER — Encounter (HOSPITAL_COMMUNITY): Payer: Self-pay

## 2021-11-18 DIAGNOSIS — J84112 Idiopathic pulmonary fibrosis: Secondary | ICD-10-CM | POA: Diagnosis not present

## 2021-11-18 DIAGNOSIS — J9601 Acute respiratory failure with hypoxia: Secondary | ICD-10-CM | POA: Diagnosis not present

## 2021-11-18 DIAGNOSIS — I10 Essential (primary) hypertension: Secondary | ICD-10-CM | POA: Diagnosis not present

## 2021-11-18 HISTORY — PX: IR FLUORO GUIDE CV LINE RIGHT: IMG2283

## 2021-11-18 LAB — BASIC METABOLIC PANEL
Anion gap: 5 (ref 5–15)
Anion gap: 6 (ref 5–15)
BUN: 36 mg/dL — ABNORMAL HIGH (ref 8–23)
BUN: 34 mg/dL — ABNORMAL HIGH (ref 8–23)
CO2: 28 mmol/L (ref 22–32)
CO2: 30 mmol/L (ref 22–32)
Calcium: 7.8 mg/dL — ABNORMAL LOW (ref 8.9–10.3)
Calcium: 8.2 mg/dL — ABNORMAL LOW (ref 8.9–10.3)
Chloride: 102 mmol/L (ref 98–111)
Chloride: 101 mmol/L (ref 98–111)
Creatinine, Ser: 0.78 mg/dL (ref 0.61–1.24)
Creatinine, Ser: 0.71 mg/dL (ref 0.61–1.24)
GFR, Estimated: >60 mL/min (ref >60)
GFR, Estimated: >60 mL/min (ref >60)
Glucose, Bld: 137 mg/dL — ABNORMAL HIGH (ref 70–99)
Glucose, Bld: 120 mg/dL — ABNORMAL HIGH (ref 70–99)
Potassium: 4.9 mmol/L (ref 3.5–5.1)
Potassium: 5.1 mmol/L (ref 3.5–5.1)
Sodium: 135 mmol/L (ref 135–145)
Sodium: 137 mmol/L (ref 135–145)

## 2021-11-18 LAB — CBC WITH DIFFERENTIAL/PLATELET
Abs Immature Granulocytes: 0.31 10*3/uL — ABNORMAL HIGH (ref 0.00–0.07)
Basophils Absolute: 0 10*3/uL (ref 0.0–0.1)
Basophils Relative: 0 %
Eosinophils Absolute: 0 10*3/uL (ref 0.0–0.5)
Eosinophils Relative: 0 %
HCT: 38.1 % — ABNORMAL LOW (ref 39.0–52.0)
Hemoglobin: 12.3 g/dL — ABNORMAL LOW (ref 13.0–17.0)
Immature Granulocytes: 1 %
Lymphocytes Relative: 6 %
Lymphs Abs: 1.2 10*3/uL (ref 0.7–4.0)
MCH: 28.1 pg (ref 26.0–34.0)
MCHC: 32.3 g/dL (ref 30.0–36.0)
MCV: 87.2 fL (ref 80.0–100.0)
Monocytes Absolute: 0.8 10*3/uL (ref 0.1–1.0)
Monocytes Relative: 4 %
Neutro Abs: 20 10*3/uL — ABNORMAL HIGH (ref 1.7–7.7)
Neutrophils Relative %: 89 %
Platelets: 189 10*3/uL (ref 150–400)
RBC: 4.37 MIL/uL (ref 4.22–5.81)
RDW: 16.5 % — ABNORMAL HIGH (ref 11.5–15.5)
WBC: 22.4 10*3/uL — ABNORMAL HIGH (ref 4.0–10.5)
nRBC: 0 % (ref 0.0–0.2)

## 2021-11-18 LAB — GLUCOSE, CAPILLARY
Glucose-Capillary: 108 mg/dL — ABNORMAL HIGH (ref 70–99)
Glucose-Capillary: 135 mg/dL — ABNORMAL HIGH (ref 70–99)
Glucose-Capillary: 176 mg/dL — ABNORMAL HIGH (ref 70–99)
Glucose-Capillary: 177 mg/dL — ABNORMAL HIGH (ref 70–99)

## 2021-11-18 MED ORDER — ACETAMINOPHEN 325 MG PO TABS: 650.0000 mg | ORAL_TABLET | ORAL | Status: DC | PRN

## 2021-11-18 MED ORDER — NOREPINEPHRINE 4 MG/250ML-% IV SOLN: 0.0000 ug/min | INTRAVENOUS | Status: DC

## 2021-11-18 MED ORDER — DIPHENHYDRAMINE HCL 25 MG PO CAPS: 25.0000 mg | ORAL_CAPSULE | Freq: Four times a day (QID) | ORAL | Status: DC | PRN

## 2021-11-18 MED ORDER — HEPARIN SODIUM (PORCINE) 1000 UNIT/ML IJ SOLN
1000.0000 [IU] | Freq: Once | INTRAMUSCULAR | Status: DC
Start: 2021-11-18 — End: 2021-11-21

## 2021-11-18 MED ORDER — ACETAMINOPHEN 325 MG PO TABS
650.0000 mg | ORAL_TABLET | ORAL | Status: DC | PRN
  Administered 2021-11-18 – 2021-11-20 (×2): 650 mg via ORAL
  Filled 2021-11-18 (×2): qty 2

## 2021-11-18 MED ORDER — SODIUM CHLORIDE 0.9 % IV SOLN
INTRAVENOUS | Status: AC
  Filled 2021-11-18 (×3): qty 200

## 2021-11-18 MED ORDER — ACD FORMULA A 0.73-2.45-2.2 GM/100ML VI SOLN
1000.0000 mL | Status: DC
  Administered 2021-11-18: 1000 mL
  Filled 2021-11-18: qty 1000

## 2021-11-18 MED ORDER — HEPARIN SODIUM (PORCINE) 1000 UNIT/ML IJ SOLN
INTRAMUSCULAR | Status: AC
  Filled 2021-11-18: qty 4

## 2021-11-18 MED ORDER — CALCIUM CARBONATE ANTACID 500 MG PO CHEW: 2.0000 | CHEWABLE_TABLET | ORAL | Status: AC

## 2021-11-18 MED ORDER — ACD FORMULA A 0.73-2.45-2.2 GM/100ML VI SOLN
1000.0000 mL | Status: DC
  Administered 2021-11-19: 1000 mL
  Filled 2021-11-18 (×2): qty 1000

## 2021-11-18 MED ORDER — SODIUM CHLORIDE 0.9% FLUSH
10.0000 mL | Freq: Two times a day (BID) | INTRAVENOUS | Status: DC
  Administered 2021-11-18 (×2): 10 mL
  Administered 2021-11-19: 40 mL
  Administered 2021-11-19 – 2021-11-20 (×3): 10 mL

## 2021-11-18 MED ORDER — ORAL CARE MOUTH RINSE
15.0000 mL | Freq: Two times a day (BID) | OROMUCOSAL | Status: DC
  Administered 2021-11-18 – 2021-11-20 (×6): 15 mL via OROMUCOSAL

## 2021-11-18 MED ORDER — CALCIUM GLUCONATE-NACL 2-0.675 GM/100ML-% IV SOLN
2.0000 g | Freq: Once | INTRAVENOUS | Status: AC
  Administered 2021-11-18: 2000 mg via INTRAVENOUS
  Filled 2021-11-18 (×3): qty 100

## 2021-11-18 MED ORDER — SODIUM CHLORIDE 0.9% FLUSH: 10.0000 mL | INTRAVENOUS | Status: DC | PRN

## 2021-11-18 MED ORDER — CALCIUM GLUCONATE 10 % IV SOLN: 2.0000 g | Freq: Once | INTRAVENOUS | Status: DC

## 2021-11-18 MED ORDER — NOREPINEPHRINE 4 MG/250ML-% IV SOLN
INTRAVENOUS | Status: AC
  Filled 2021-11-18: qty 250

## 2021-11-18 MED ORDER — CALCIUM GLUCONATE-NACL 2-0.675 GM/100ML-% IV SOLN
2.0000 g | Freq: Once | INTRAVENOUS | Status: AC
  Administered 2021-11-19: 2000 mg via INTRAVENOUS
  Filled 2021-11-18 (×3): qty 100

## 2021-11-18 MED ORDER — HEPARIN SODIUM (PORCINE) 1000 UNIT/ML IJ SOLN: 3000.0000 [IU] | Freq: Once | INTRAMUSCULAR | Status: DC

## 2021-11-18 NOTE — Plan of Care (Signed)

## 2021-11-18 NOTE — Progress Notes (Signed)
? ?NAME:  Bryan Tyler, MRN:  [id number], DOB:  02/10/1946, LOS: 75 ?ADMISSION DATE:  10/12/2021, CONSULTATION DATE:  3/28 ?REFERRING MD:  Lingamgunta, [facility], CHIEF COMPLAINT:  Dyspnea  ? ?BRIEF  ?This is a pleasant 76 year old male with a past medical history significant for idiopathic pulmonary fibrosis who has been followed by my partners in the pulmonary clinic for the same for several years who presents to our facility on transfer from [facility] in [facility] with a diagnosis of acute respiratory failure with hypoxemia due to an IPF exacerbation.  The patient has previously been followed by Dr. Vaughan Browner and has been enrolled in a clinical trial since January of this year entitled:  ?[id number] (FibroGen Study) is a Phase 3, randomized, double-blind, placebo-controlled multicenter international study to evaluate the efficacy and safety of 30 mg/kg IV infusions of pamrevlumab administered every 3 weeks for 52 weeks as compared to placebo in subjects with Idiopathic Pulmonary Fibrosis. Primary end point is: change in FVC from baseline at week 52. ZEPHYRUS II STUDY. ? ?The patient tells me that in prior years he was intolerant of antifibrotic agents for pulmonary fibrosis.  However, he has remained active, and says that he was not using oxygen prior to admission.  Approximately 1 week prior to admission he said that after going to the gym he felt "a little different" but did not complain of shortness of breath or cough.  On Friday, March 23 he traveled to [facility] where shortly after arrival he began to become very short of breath with associated headache and dry cough.  He denies fevers chills, rash, nausea, vomiting, chest pain or sinus complaint.  He is unaware of any sick contacts.  After 2 days of dyspnea his son-in-law took him to the emergency department in [place] where he was admitted for severe acute respiratory failure with hypoxemia by the hospitalist service.   Pulmonary was consulted at [facility], specifically Drs. Posey Pronto and Graham.  The patient had a work-up in [place] which included a respiratory viral panel which was negative for COVID and flu however an extended panel showed rhinovirus infection.  Lab work showed a BNP which is mildly elevated at 136 troponin was slightly elevated.  He had a CT angiogram of his chest which showed no pulmonary embolism but he did have severe pulmonary fibrosis and bilateral peripheral opacification.  He was treated with IV steroids, azithromycin and cefepime.  An echocardiogram was performed.  On admission his oxygen needs were 6 L of nasal cannula (March 25) but he was moved to the intensive care unit for progressive worsening of oxygenation and required heated high flow.  Request for transfer to [name] was made on March 27 but there were no beds available so he was moved today March 28.  By the time of being moved he was transferred on Optiflow (heated high flow) at 40 L/min and 60% FiO2. ? ?Other lab shows a mycoplasma IgM MRSA swab negative basic metabolic panel is normal troponin slightly 77, she is normal with the exception of a slightly elevated [name].  This is 1.2/dL ? ?Pertinent  Medical History  ?Idiopathic pulmonary fibrosis ?Adenocarcinoma of the prostate ?Thoracic aortic aneurysm ?Anxiety ?Depression ?Diverticulosis ?GERD ?Hyperlipidemia ?Hypertension ?Hypothyroidism ?Obstructive sleep apnea, not on CPAP ?Osteoporosis ? ?Significant Hospital Events: ?Including procedures, antibiotic start and stop dates in addition to other pertinent events   ?March 25, admitted to [facility] in [place], pulmonary consulted, treated with steroids, ceftriaxone, azithromycin for an  IPF flare, noted to be rhinovirus positive.  COVID and flu testing negative ?March 26 moved to the intensive care unit for heated high flow ?March 28 transferred to [facility] ?3/31 On 20 L / 60% ?4/2 HFNC 30L/ 50%, continues to  desaturate w/minimal exertion ?4/3 and 4/4 no changes, remains on 50-60%, 30L and NRB w/exertion.  Sed rate wnl, PCT neg ?4/7 transferred to [facility] for PLEX protocol.  ?4/8 PLEX #1 ?4/10 Plex #2  ? ?Interim History / Subjective:  ?Plex this morning. Denies complaints, feeling better. Desaturates significantly when transferring to chair. ? ?Objective   ?Blood pressure 116/76, pulse 79, temperature 97.6 ?F (36.4 ?C), resp. rate (!) 33, height '5\' 9"'$  (1.753 m), weight 79.8 kg, SpO2 96 %. ?   ?FiO2 (%):  [60 %-70 %] 70 %  ? ?Intake/Output Summary (Last 24 hours) at 11/18/2021 0859 ?Last data filed at 11/17/2021 2000 ?Gross per 24 hour  ?Intake 120 ml  ?Output 1575 ml  ?Net -1455 ml  ? ? ?Filed Weights  ? 11/17/21 0424 11/18/21 0500 11/18/21 0830  ?Weight: 75.9 kg 76 kg 79.8 kg  ? ? ?Examination:  ?General Appearance: Ill -appearing man sitting up in bed no acute distress ?Head:  [place]/AT, eyes anicteric ?Neck: RIJ dialysis catheter ?Lungs: Tachypnea, rales right greater than left.  No wheezing.   ?Heart: S1-S2, regular rate and rhythm ?Abdomen: Soft, nontender ?Extremities: No peripheral edema, no cyanosis ?Skin: No diffuse rashes, no erythema around dialysis catheter ?Neurologic: Awake and alert, CAM-ICU negative.  Moving all extremities spontaneously. ? ?WBC 22.4 ?H/H 12.3/38.1 ?BUN 34 ?Cr 0.71 ? ?Resolved problems list:   ? ? ?Assessment & Plan:  ? ?Acute hypoxic respiratory failure requiring heated high flow oxygen due to rhinovirus- induced IPF flare. Not on home O2 PTA. ?-Continue supplemental oxygen as required to maintain SPO2 greater than 90%. ?- Progressive mobility as able.  Desaturate significantly.  Would not try to walk in the hall until oxygenation is improved, but out of bed to chair is okay ?-Continue experimental therapy with PLEX and rituximab.  Prednisone and prophylactic Bactrim ordered. ?-I reached out to [facility] to determine if he will still receive his protocol experimental treatment for the trial he is  enrolled in. ?-Pulmonary hygiene ?-Requires ongoing ICU care for PLEX and severe respiratory failure ? ?- autoantibody reduction protocol - (https://journals.plos.org/plosone/article?id=10.1371/journal.pone.3419622) ?- PLEX mixed with IvIG and Rituxan - try PLEX for first 3 days and then reasess x total 15 day protocl ?- questions can call [facility] ?- patient explained about the fact there is 1 study evidence  of improved outcomes and currently is also being studied in RCT and anecdotally at [facility] with good success ?- Research Drug  Pamrevlumab (IP v placebo) infusion - 3rd dose (week #6) due 11/15/21 - 11/22/21-->  [facility] team will check with sponsor [employer] and if patient willing will aim to infuse IV MAB as inpatient ? ?Anxiety ?Depression  ?-Continue clonazepam, gabapentin, hydroxyzine, Effexor, Abilify ? ?Hypothyroidism ?-Synthroid ? ?Steroid- induced hyperglycemia ?-Sliding scale insulin as needed; for requirements have been low ?- Goal blood glucose 140-180 if requiring insulin. ? ?BPH ?Hx prostate cancer  ?-Continue tamsulosin ? ?GERD ?-Continue PPI ? ?HTN ?-Amlodipine ? ?HLD ?-Atorvastatin ? ? ?Best Practice (right click and "Reselect all SmartList Selections" daily)  ? ?Diet/type: Full diet ?DVT prophylaxis: LMWH ?GI prophylaxis: PPI ?Lines: N/A ?Foley:  N/A ?Code Status:  DNR but full medical care ? ?Last date of multidisciplinary goals of care discussion [4/3]  Pt updated daily  - wife updated separately over phone 11/15/2021 10:46 AM ? ? ?Dr Chase Caller avail for questions via cell/email through afternoon 11/17/21 and then again 11/19/21 onwards ? ? ?This patient is critically ill with multiple organ system failure which requires frequent high complexity decision making, assessment, support, evaluation, and titration of therapies. This was completed through the application of advanced monitoring technologies and extensive interpretation of multiple databases. During this encounter critical care time  was devoted to patient care services described in this note for 40 minutes. ? ?Julian Hy, DO 11/18/21 1:42 PM ?[facility] ? ?

## 2021-11-18 NOTE — Progress Notes (Signed)
Nephrology Follow-Up Consult note ? ? ?Assessment/Recommendations: Bryan Tyler is a/an 76 y.o. male with a past medical history significant for IPF, admitted for IPF exacerbation.    ? ? ? ?IPF exacerbation: Life-threatening hypoxic respiratory failure.  Primary team asked for a pheresis.  Tolerated first session on 4/8.  Plan for repeat session on 4/10 and 4/11.  We will then reassess the patient's response to decide on further treatments.  Using albumin as replacement.  No signs of bleeding ? ?Acute hypoxic respiratory failure: Supplemental oxygen per primary team ? ?Hypertension: Continue home medications ? ?Hypothyroidism: Home medications ? ?Anxiety/depression: Mood and affect appropriate today ? ? ?Recommendations conveyed to primary service.  ? ? ?Reesa Chew ?[facility] ?11/18/2021 ?9:11 AM ? ?___________________________________________________________ ? ?CC: acute on chronic hypoxic respiratory failure ? ?Interval History/Subjective: Patient in good spirits today.  Feels like his breathing continues to improve.  Denies any chest pain or nausea or vomiting.  No allergic reaction to recent Plex treatment ? ? ?Medications:  ?Current Facility-Administered Medications  ?Medication Dose Route Frequency Provider Last Rate Last Admin  ? acetaminophen (TYLENOL) tablet 650 mg  650 mg Oral Q4H PRN Roney Jaffe, MD      ? albumin human 25 % 50 g in sodium chloride 0.9 %   Dialysis Q1 Hr x 3 Roney Jaffe, MD   New Bag at 11/18/21 0840  ? albuterol (PROVENTIL) (2.5 MG/3ML) 0.083% nebulizer solution 2.5 mg  2.5 mg Nebulization Q2H PRN Simonne Maffucci B, MD      ? amLODipine (NORVASC) tablet 5 mg  5 mg Oral Daily Simonne Maffucci B, MD   5 mg at 11/17/21 0913  ? ARIPiprazole (ABILIFY) tablet 2 mg  2 mg Oral Daily Simonne Maffucci B, MD   2 mg at 11/17/21 0913  ? atorvastatin (LIPITOR) tablet 40 mg  40 mg Oral QPM Simonne Maffucci B, MD   40 mg at 11/17/21 1703  ? azelastine (ASTELIN) 0.1 %  nasal spray 2 spray  2 spray Each Nare BID Juanito Doom, MD   2 spray at 11/17/21 2111  ? benzonatate (TESSALON) capsule 100 mg  100 mg Oral TID PRN Elsie Lincoln, MD   100 mg at 11/15/21 1958  ? calcium gluconate 2 g/ 100 mL sodium chloride IVPB  2 g Intravenous Once Roney Jaffe, MD      ? calcium gluconate inj 10% (1 g) URGENT USE ONLY!  2 g Intravenous Once Roney Jaffe, MD      ? calcium gluconate inj 10% (1 g) URGENT USE ONLY!  2 g Intravenous Once Roney Jaffe, MD      ? Chlorhexidine Gluconate Cloth 2 % PADS 6 each  6 each Topical Daily Juanito Doom, MD   6 each at 11/17/21 1703  ? citrate dextrose (ACD-A anticoagulant) solution 1,000 mL  1,000 mL Other Continuous Roney Jaffe, MD   1,000 mL at 11/16/21 1000  ? citrate dextrose (ACD-A anticoagulant) solution 1,000 mL  1,000 mL Other Continuous Roney Jaffe, MD      ? clonazePAM Bryan Charleston) tablet 0.5 mg  0.5 mg Oral BID PRN Juanito Doom, MD   0.5 mg at 11/17/21 0415  ? clonazePAM (KLONOPIN) tablet 1.5 mg  1.5 mg Oral QHS Simonne Maffucci B, MD   1.5 mg at 11/17/21 2112  ? diphenhydrAMINE (BENADRYL) capsule 25 mg  25 mg Oral Q6H PRN Roney Jaffe, MD      ? docusate sodium (COLACE) capsule 100 mg  100 mg Oral BID PRN Simonne Maffucci B, MD   100 mg at 11/17/21 0913  ? enoxaparin (LOVENOX) injection 40 mg  40 mg Subcutaneous Q24H Simonne Maffucci B, MD   40 mg at 11/17/21 0912  ? feeding supplement (ENSURE ENLIVE / ENSURE PLUS) liquid 237 mL  237 mL Oral BID BM Kipp Brood, MD   237 mL at 11/17/21 1703  ? fluticasone (FLONASE) 50 MCG/ACT nasal spray 2 spray  2 spray Each Nare Daily Juanito Doom, MD   2 spray at 11/17/21 1958  ? gabapentin (NEURONTIN) capsule 800 mg  800 mg Oral BID Simonne Maffucci B, MD   800 mg at 11/17/21 2113  ? guaiFENesin-dextromethorphan (ROBITUSSIN DM) 100-10 MG/5ML syrup 5 mL  5 mL Oral Q4H PRN Frederik Pear, MD   5 mL at 11/16/21 1858  ? heparin sodium (porcine) 1000 UNIT/ML injection            ? heparin sodium (porcine) injection 1,000 Units  1,000 Units Intracatheter Once Roney Jaffe, MD      ? heparin sodium (porcine) injection 1,000 Units  1,000 Units Intracatheter Once Roney Jaffe, MD      ? HYDROcodone-acetaminophen (NORCO/VICODIN) 5-325 MG per tablet 1 tablet  1 tablet Oral Q6H PRN Corey Harold, NP   1 tablet at 11/17/21 2234  ? hydrOXYzine (ATARAX) tablet 25 mg  25 mg Oral Q6H PRN Cristal Generous, NP   25 mg at 11/17/21 2113  ? insulin aspart (novoLOG) injection 0-15 Units  0-15 Units Subcutaneous TID WC Juanito Doom, MD   2 Units at 11/17/21 1702  ? insulin aspart (novoLOG) injection 0-5 Units  0-5 Units Subcutaneous QHS Simonne Maffucci B, MD      ? levothyroxine (SYNTHROID) tablet 112 mcg  112 mcg Oral Daily Juanito Doom, MD   112 mcg at 11/18/21 0645  ? methylPREDNISolone sodium succinate (SOLU-MEDROL) 125 mg/2 mL injection 80 mg  80 mg Intravenous Q12H Simonne Maffucci B, MD   80 mg at 11/17/21 1958  ? multivitamin with minerals tablet 1 tablet  1 tablet Oral Daily Simonne Maffucci B, MD   1 tablet at 11/17/21 0912  ? norepinephrine (LEVOPHED) 4-5 MG/250ML-% infusion SOLN           ? ondansetron (ZOFRAN) injection 4 mg  4 mg Intravenous Q6H PRN Juanito Doom, MD      ? pantoprazole (PROTONIX) EC tablet 40 mg  40 mg Oral Daily Simonne Maffucci B, MD   40 mg at 11/17/21 0912  ? polyethylene glycol (MIRALAX / GLYCOLAX) packet 17 g  17 g Oral Daily PRN Juanito Doom, MD      ? sulfamethoxazole-trimethoprim (BACTRIM DS) 800-160 MG per tablet 1 tablet  1 tablet Oral Once per day on Mon Wed Fri Candee Furbish, MD   1 tablet at 11/15/21 1029  ? tamsulosin (FLOMAX) capsule 0.4 mg  0.4 mg Oral Daily Simonne Maffucci B, MD   0.4 mg at 11/17/21 0254  ? venlafaxine XR (EFFEXOR-XR) 24 hr capsule 300 mg  300 mg Oral Daily Simonne Maffucci B, MD   300 mg at 11/17/21 0913  ?  ? ? ?Review of Systems: ?10 systems reviewed and negative except per interval  history/subjective ? ?Physical Exam: ?Vitals:  ? 11/18/21 0850 11/18/21 0900  ?BP: 116/76 116/71  ?Pulse: 79 81  ?Resp: (!) 33 (!) 31  ?Temp:    ?SpO2: 96% 95%  ? ?No intake/output data recorded. ? ?Intake/Output  Summary (Last 24 hours) at 11/18/2021 0911 ?Last data filed at 11/17/2021 2000 ?Gross per 24 hour  ?Intake 120 ml  ?Output 1075 ml  ?Net -955 ml  ? ?Constitutional: well-appearing, no acute distress ?ENMT: ears and nose without scars or lesions, MMM ?CV: normal rate, no edema ?Respiratory: Increased work of breathing, bilateral crackles, on supplemental oxygen ?Gastrointestinal: soft, non-tender, no palpable masses or hernias ?Skin: no visible lesions or rashes ?Psych: alert, judgement/insight appropriate, appropriate mood and affect ? ? ?Test Results ?I personally reviewed new and old clinical labs and radiology tests ?Lab Results  ?Component Value Date  ? NA 137 11/18/2021  ? K 5.1 11/18/2021  ? CL 101 11/18/2021  ? CO2 30 11/18/2021  ? BUN 34 (H) 11/18/2021  ? CREATININE 0.71 11/18/2021  ? GFR 83.70 06/11/2021  ? CALCIUM 8.2 (L) 11/18/2021  ? ALBUMIN 3.0 (L) 11/07/2021  ? PHOS 3.7 11/07/2021  ? ? ?CBC ?Recent Labs  ?Lab 11/12/21 ?0238 11/17/21 ?0206 11/18/21 ?0020  ?WBC 17.1* 30.8* 22.4*  ?NEUTROABS  --  27.3* 20.0*  ?HGB 12.1* 13.9 12.3*  ?HCT 39.1 43.3 38.1*  ?MCV 88.1 87.5 87.2  ?PLT 282 283 189  ? ? ? ? ? ?

## 2021-11-19 DIAGNOSIS — J9601 Acute respiratory failure with hypoxia: Secondary | ICD-10-CM | POA: Diagnosis not present

## 2021-11-19 DIAGNOSIS — B348 Other viral infections of unspecified site: Secondary | ICD-10-CM | POA: Diagnosis not present

## 2021-11-19 DIAGNOSIS — R739 Hyperglycemia, unspecified: Secondary | ICD-10-CM | POA: Diagnosis not present

## 2021-11-19 DIAGNOSIS — J84112 Idiopathic pulmonary fibrosis: Secondary | ICD-10-CM | POA: Diagnosis not present

## 2021-11-19 LAB — CBC WITH DIFFERENTIAL/PLATELET
Abs Immature Granulocytes: 0.46 10*3/uL — ABNORMAL HIGH (ref 0.00–0.07)
Basophils Absolute: 0 10*3/uL (ref 0.0–0.1)
Basophils Relative: 0 %
Eosinophils Absolute: 0 10*3/uL (ref 0.0–0.5)
Eosinophils Relative: 0 %
HCT: 40.8 % (ref 39.0–52.0)
Hemoglobin: 13.3 g/dL (ref 13.0–17.0)
Immature Granulocytes: 2 %
Lymphocytes Relative: 8 %
Lymphs Abs: 2 10*3/uL (ref 0.7–4.0)
MCH: 28.7 pg (ref 26.0–34.0)
MCHC: 32.6 g/dL (ref 30.0–36.0)
MCV: 88.1 fL (ref 80.0–100.0)
Monocytes Absolute: 0.6 10*3/uL (ref 0.1–1.0)
Monocytes Relative: 2 %
Neutro Abs: 23.9 10*3/uL — ABNORMAL HIGH (ref 1.7–7.7)
Neutrophils Relative %: 88 %
Platelets: 204 10*3/uL (ref 150–400)
RBC: 4.63 MIL/uL (ref 4.22–5.81)
RDW: 17 % — ABNORMAL HIGH (ref 11.5–15.5)
Smear Review: ADEQUATE
WBC: 27 10*3/uL — ABNORMAL HIGH (ref 4.0–10.5)
nRBC: 0 % (ref 0.0–0.2)

## 2021-11-19 LAB — GLUCOSE, CAPILLARY
Glucose-Capillary: 110 mg/dL — ABNORMAL HIGH (ref 70–99)
Glucose-Capillary: 149 mg/dL — ABNORMAL HIGH (ref 70–99)
Glucose-Capillary: 172 mg/dL — ABNORMAL HIGH (ref 70–99)
Glucose-Capillary: 127 mg/dL — ABNORMAL HIGH (ref 70–99)

## 2021-11-19 LAB — BASIC METABOLIC PANEL
Anion gap: 5 (ref 5–15)
BUN: 36 mg/dL — ABNORMAL HIGH (ref 8–23)
CO2: 27 mmol/L (ref 22–32)
Calcium: 8.1 mg/dL — ABNORMAL LOW (ref 8.9–10.3)
Chloride: 104 mmol/L (ref 98–111)
Creatinine, Ser: 0.72 mg/dL (ref 0.61–1.24)
GFR, Estimated: >60 mL/min (ref >60)
Glucose, Bld: 147 mg/dL — ABNORMAL HIGH (ref 70–99)
Potassium: 5 mmol/L (ref 3.5–5.1)
Sodium: 136 mmol/L (ref 135–145)

## 2021-11-19 MED ORDER — SODIUM CHLORIDE 0.9 % IV SOLN
INTRAVENOUS | Status: AC
  Filled 2021-11-19 (×3): qty 200

## 2021-11-19 NOTE — Progress Notes (Signed)
Nephrology Follow-Up Consult note ? ? ?Assessment/Recommendations: Bryan Tyler is a/an 76 y.o. male with a past medical history significant for IPF, admitted for IPF exacerbation.    ? ? ? ?IPF exacerbation: Life-threatening hypoxic respiratory failure.  Primary team asked for a pheresis.  Tolerated plex on 4/8 and 4/10 now w/ session today.  We will then reassess the patient's response to decide on further treatments in conjunction with recommendations from the pulmonary team.  Using albumin as replacement.  No signs of bleeding ? ?Acute hypoxic respiratory failure: Supplemental oxygen per primary team ? ?Hypertension: Continue home medications ? ?Hypothyroidism: Home medications ? ?Anxiety/depression: Mood and affect appropriate today ? ? ?Recommendations conveyed to primary service.  ? ? ?Reesa Chew ?[facility] ?11/19/2021 ?10:33 AM ? ?___________________________________________________________ ? ?CC: acute on chronic hypoxic respiratory failure ? ?Interval History/Subjective: Patient states he feels well today.  Plasma exchange went well with no significant issues.  He states the shortness of breath is about the same ? ? ?Medications:  ?Current Facility-Administered Medications  ?Medication Dose Route Frequency Provider Last Rate Last Admin  ? acetaminophen (TYLENOL) tablet 650 mg  650 mg Oral Q4H PRN Roney Jaffe, MD   650 mg at 11/18/21 1939  ? albuterol (PROVENTIL) (2.5 MG/3ML) 0.083% nebulizer solution 2.5 mg  2.5 mg Nebulization Q2H PRN Simonne Maffucci B, MD      ? amLODipine (NORVASC) tablet 5 mg  5 mg Oral Daily Juanito Doom, MD   5 mg at 11/19/21 0913  ? ARIPiprazole (ABILIFY) tablet 2 mg  2 mg Oral Daily Simonne Maffucci B, MD   2 mg at 11/19/21 0913  ? atorvastatin (LIPITOR) tablet 40 mg  40 mg Oral QPM Simonne Maffucci B, MD   40 mg at 11/18/21 1830  ? azelastine (ASTELIN) 0.1 % nasal spray 2 spray  2 spray Each Nare BID Juanito Doom, MD   2 spray at 11/19/21  0914  ? benzonatate (TESSALON) capsule 100 mg  100 mg Oral TID PRN Elsie Lincoln, MD   100 mg at 11/18/21 1544  ? calcium carbonate (TUMS - dosed in mg elemental calcium) chewable tablet 400 mg of elemental calcium  2 tablet Oral Q3H Roney Jaffe, MD      ? calcium gluconate 2 g/ 100 mL sodium chloride IVPB  2 g Intravenous Once Roney Jaffe, MD      ? calcium gluconate inj 10% (1 g) URGENT USE ONLY!  2 g Intravenous Once Roney Jaffe, MD      ? Chlorhexidine Gluconate Cloth 2 % PADS 6 each  6 each Topical Daily Juanito Doom, MD   6 each at 11/19/21 0914  ? citrate dextrose (ACD-A anticoagulant) solution 1,000 mL  1,000 mL Other Continuous Roney Jaffe, MD   1,000 mL at 11/18/21 0840  ? citrate dextrose (ACD-A anticoagulant) solution 1,000 mL  1,000 mL Other Continuous Roney Jaffe, MD      ? clonazePAM Bobbye Charleston) tablet 0.5 mg  0.5 mg Oral BID PRN Simonne Maffucci B, MD   0.5 mg at 11/18/21 1544  ? clonazePAM (KLONOPIN) tablet 1.5 mg  1.5 mg Oral QHS Simonne Maffucci B, MD   1.5 mg at 11/18/21 2113  ? diphenhydrAMINE (BENADRYL) capsule 25 mg  25 mg Oral Q6H PRN Roney Jaffe, MD      ? docusate sodium (COLACE) capsule 100 mg  100 mg Oral BID PRN Simonne Maffucci B, MD   100 mg at 11/17/21 0913  ? enoxaparin (LOVENOX)  injection 40 mg  40 mg Subcutaneous Q24H Simonne Maffucci B, MD   40 mg at 11/19/21 0912  ? feeding supplement (ENSURE ENLIVE / ENSURE PLUS) liquid 237 mL  237 mL Oral BID BM Kipp Brood, MD   237 mL at 11/19/21 0914  ? fluticasone (FLONASE) 50 MCG/ACT nasal spray 2 spray  2 spray Each Nare Daily Juanito Doom, MD   2 spray at 11/17/21 1958  ? gabapentin (NEURONTIN) capsule 800 mg  800 mg Oral BID Simonne Maffucci B, MD   800 mg at 11/19/21 0913  ? guaiFENesin-dextromethorphan (ROBITUSSIN DM) 100-10 MG/5ML syrup 5 mL  5 mL Oral Q4H PRN Frederik Pear, MD   5 mL at 11/19/21 0912  ? heparin sodium (porcine) injection 1,000 Units  1,000 Units Intracatheter Once Roney Jaffe, MD      ? heparin sodium (porcine) injection 3,000 Units  3,000 Units Intracatheter Once Roney Jaffe, MD      ? HYDROcodone-acetaminophen (NORCO/VICODIN) 5-325 MG per tablet 1 tablet  1 tablet Oral Q6H PRN Corey Harold, NP   1 tablet at 11/19/21 1021  ? hydrOXYzine (ATARAX) tablet 25 mg  25 mg Oral Q6H PRN Cristal Generous, NP   25 mg at 11/19/21 0913  ? insulin aspart (novoLOG) injection 0-15 Units  0-15 Units Subcutaneous TID WC Juanito Doom, MD   3 Units at 11/18/21 1611  ? insulin aspart (novoLOG) injection 0-5 Units  0-5 Units Subcutaneous QHS Simonne Maffucci B, MD      ? levothyroxine (SYNTHROID) tablet 112 mcg  112 mcg Oral Daily Juanito Doom, MD   112 mcg at 11/19/21 0529  ? MEDLINE mouth rinse  15 mL Mouth Rinse BID Noemi Chapel P, DO   15 mL at 11/19/21 8413  ? methylPREDNISolone sodium succinate (SOLU-MEDROL) 125 mg/2 mL injection 80 mg  80 mg Intravenous Q12H Simonne Maffucci B, MD   80 mg at 11/19/21 2440  ? multivitamin with minerals tablet 1 tablet  1 tablet Oral Daily Juanito Doom, MD   1 tablet at 11/19/21 0913  ? ondansetron (ZOFRAN) injection 4 mg  4 mg Intravenous Q6H PRN Juanito Doom, MD      ? pantoprazole (PROTONIX) EC tablet 40 mg  40 mg Oral Daily Juanito Doom, MD   40 mg at 11/19/21 0913  ? polyethylene glycol (MIRALAX / GLYCOLAX) packet 17 g  17 g Oral Daily PRN Simonne Maffucci B, MD      ? sodium chloride flush (NS) 0.9 % injection 10-40 mL  10-40 mL Intracatheter Q12H Noemi Chapel P, DO   40 mL at 11/19/21 1027  ? sodium chloride flush (NS) 0.9 % injection 10-40 mL  10-40 mL Intracatheter PRN Julian Hy, DO      ? sulfamethoxazole-trimethoprim (BACTRIM DS) 800-160 MG per tablet 1 tablet  1 tablet Oral Once per day on Mon Wed Fri Candee Furbish, MD   1 tablet at 11/18/21 1039  ? tamsulosin (FLOMAX) capsule 0.4 mg  0.4 mg Oral Daily Simonne Maffucci B, MD   0.4 mg at 11/19/21 0913  ? venlafaxine XR (EFFEXOR-XR) 24 hr capsule 300 mg  300 mg  Oral Daily Simonne Maffucci B, MD   300 mg at 11/19/21 0913  ?  ? ? ?Review of Systems: ?10 systems reviewed and negative except per interval history/subjective ? ?Physical Exam: ?Vitals:  ? 11/19/21 0900 11/19/21 1000  ?BP: 129/75 129/84  ?Pulse: 96 90  ?Resp: Marland Kitchen)  41 (!) 45  ?Temp:    ?SpO2: (!) 86% (!) 87%  ? ?Total I/O ?In: 22 [I.V.:40] ?Out: 400 [Urine:400] ? ?Intake/Output Summary (Last 24 hours) at 11/19/2021 1033 ?Last data filed at 11/19/2021 1000 ?Gross per 24 hour  ?Intake 160 ml  ?Output 1200 ml  ?Net -1040 ml  ? ?Constitutional: well-appearing, no acute distress ?ENMT: ears and nose without scars or lesions, MMM ?CV: normal rate, no edema ?Respiratory: Increased work of breathing, bilateral crackles, on supplemental oxygen ?Gastrointestinal: soft, non-tender, no palpable masses or hernias ?Skin: no visible lesions or rashes ?Psych: alert, judgement/insight appropriate, appropriate mood and affect ? ? ?Test Results ?I personally reviewed new and old clinical labs and radiology tests ?Lab Results  ?Component Value Date  ? NA 136 11/19/2021  ? K 5.0 11/19/2021  ? CL 104 11/19/2021  ? CO2 27 11/19/2021  ? BUN 36 (H) 11/19/2021  ? CREATININE 0.72 11/19/2021  ? GFR 83.70 06/11/2021  ? CALCIUM 8.1 (L) 11/19/2021  ? ALBUMIN 3.0 (L) 11/07/2021  ? PHOS 3.7 11/07/2021  ? ? ?CBC ?Recent Labs  ?Lab 11/17/21 ?0206 11/18/21 ?0020 11/19/21 ?8110  ?WBC 30.8* 22.4* 27.0*  ?NEUTROABS 27.3* 20.0* 23.9*  ?HGB 13.9 12.3* 13.3  ?HCT 43.3 38.1* 40.8  ?MCV 87.5 87.2 88.1  ?PLT 283 189 204  ? ? ? ? ? ?

## 2021-11-19 NOTE — Progress Notes (Signed)
Patient WR:UEAVWUJ DIVIT STIPP      DOB: 1945-09-08      WJX:[id number] ? ? ? ?  ?Palliative Medicine Team ? ? ? ?Subjective: Bedside symptom check. No family or visitors at time of visit.  ? ? ?Physical exam: Patient sleeping in recliner chair at time of visit. Breathing even and non-labored with nasal cannula in place. Patient does not display physical or non-verbal signs of pain or discomfort at this time. Vital signs look good at time of visit. This RN did not try to wake, to encourage rest and healing. ? ? ?Assessment and plan: Will continue to follow along with patient's course of treatment. Bedside RN without concerns at this time. Will try to visit again tomorrow when more alert.  ? ? ?Thank you for allowing the Palliative Medicine Team to assist in the care of this patient. ?  ?  ?Damian Leavell, MSN, RN ?Palliative Medicine Team ?Team Phone: [phone]  ?This phone is monitored 7a-7p, please reach out to attending physician outside of these hours for urgent needs.   ?

## 2021-11-19 NOTE — Evaluation (Signed)
Occupational Therapy Evaluation ?Patient Details ?Name: Bryan Tyler ?MRN: [id number] ?DOB: [birth year] ?Today's Date: 11/19/2021 ? ? ?History of Present Illness 76 yo male admitted 11/15/2021 with idiopathic pulm fibrosis exacerbation, acute resp failure. 3/31 on 20L / 60%. 4/2 HFNC 30L/50%. 4/7 transferred to [facility] for PLEX protocol. Hx of IPF, OSA, osteoporosis  ? ?Clinical Impression ?  ?PTA, pt was living with his wife and was independent; working part time as a Oceanographer and enjoys traveling.  Pt currently requiring Min A for UB ADLs, Min-Max A for LB ADLs, and Min guard A for functional transfers. During bathing, pt demonstrating decreased activity tolerance as seen by fatigue, elevated RR, and decreased SpO2. Requiring seated, supported rest breaks (~2 min) to recover. Pt would benefit from further acute OT to facilitate safe dc. Pending progress, recommend dc to home with HHOT for further OT to optimize safety, independence with ADLs, and return to PLOF.   ?   ? ?Recommendations for follow up therapy are one component of a multi-disciplinary discharge planning process, led by the attending physician.  Recommendations may be updated based on patient status, additional functional criteria and insurance authorization.  ? ?Follow Up Recommendations ? Home health OT (Pending progress)  ?  ?Assistance Recommended at Discharge Frequent or constant Supervision/Assistance  ?Patient can return home with the following A little help with walking and/or transfers;A little help with bathing/dressing/bathroom ? ?  ?Functional Status Assessment ? Patient has had a recent decline in their functional status and demonstrates the ability to make significant improvements in function in a reasonable and predictable amount of time.  ?Equipment Recommendations ? BSC/3in1  ?  ?Recommendations for Other Services   ? ? ?  ?Precautions / Restrictions Precautions ?Precautions: Other (comment) ?Precaution Comments: HFNC + NRB for  mobility, watch O2/RR ?Restrictions ?Weight Bearing Restrictions: No  ? ?  ? ?Mobility Bed Mobility ?  ?  ?  ?  ?  ?  ?  ?General bed mobility comments: Sitting in recliner upon arriva; ?  ? ?Transfers ?Overall transfer level: Needs assistance ?Equipment used: Rolling walker (2 wheels) ?Transfers: Sit to/from Stand, Bed to chair/wheelchair/BSC ?Sit to Stand: Min guard ?  ?  ?  ?  ?  ?General transfer comment: Min Guard A for safety ?  ? ?  ?Balance Overall balance assessment: No apparent balance deficits (not formally assessed) ?  ?  ?  ?  ?  ?  ?  ?  ?  ?  ?  ?  ?  ?  ?  ?  ?  ?  ?   ? ?ADL either performed or assessed with clinical judgement  ? ?ADL Overall ADL's : Needs assistance/impaired ?Eating/Feeding: Set up;Sitting ?  ?Grooming: Oral care;Set up;Sitting ?  ?Upper Body Bathing: Minimal assistance;Sitting ?  ?Lower Body Bathing: Minimal assistance;Sit to/from stand ?Lower Body Bathing Details (indicate cue type and reason): Bending forward to wash LEs and RR elevating to 67 and SpO2 dropping to 80s on HHFN ?Upper Body Dressing : Minimal assistance;Sitting ?  ?Lower Body Dressing: Maximal assistance;Sit to/from stand ?  ?Toilet Transfer: Min guard;Ambulation (simulated to recliner) ?Toilet Transfer Details (indicate cue type and reason): Min Guard A for sit<>stand from recliner ?Toileting- Clothing Manipulation and Hygiene: Maximal assistance;Sit to/from stand ?Toileting - Clothing Manipulation Details (indicate cue type and reason): Max A for peri care in standing due to fatigue ?  ?  ?Functional mobility during ADLs: Min guard;Rolling walker (2 wheels) (sit<>stand) ?General ADL Comments: Pt  presenting with decreased activity tolerance as seen by signiicant fatigue and elevation in RR during simple movement and activity.  ? ? ? ?Vision Baseline Vision/History: 1 Wears glasses ?   ?   ?Perception   ?  ?Praxis   ?  ? ?Pertinent Vitals/Pain Pain Assessment ?Pain Assessment: Faces ?Faces Pain Scale: No hurt ?Pain  Intervention(s): Monitored during session  ? ? ? ?Hand Dominance Right ?  ?Extremity/Trunk Assessment Upper Extremity Assessment ?Upper Extremity Assessment: Overall WFL for tasks assessed ?  ?Lower Extremity Assessment ?Lower Extremity Assessment: Generalized weakness ?  ?Cervical / Trunk Assessment ?Cervical / Trunk Assessment: Normal ?  ?Communication Communication ?Communication: No difficulties ?  ?Cognition Arousal/Alertness: Awake/alert ?Behavior During Therapy: [facility] for tasks assessed/performed ?Overall Cognitive Status: Within Functional Limits for tasks assessed ?  ?  ?  ?  ?  ?  ?  ?  ?  ?  ?  ?  ?  ?  ?  ?  ?General Comments: Very motivated ?  ?  ?General Comments  With activity, SpO2 84% and RR 50-60s. With rest break, SpO2 88-94 and RR 30s. ? ?  ?Exercises Exercises: Other exercises ?Other Exercises ?Other Exercises: Use of breathing exercise app to decrease RR and increase control of breathing. ?  ?Shoulder Instructions    ? ? ?Home Living Family/patient expects to be discharged to:: Private residence ?Living Arrangements: Spouse/significant other ?Available Help at Discharge: Family ?Type of Home: House ?Home Access: Stairs to enter ?Entrance Stairs-Number of Steps: 3 ?Entrance Stairs-Rails: None ?Home Layout: Able to live on main level with bedroom/bathroom ?  ?  ?Bathroom Shower/Tub: Walk-in shower ?  ?Bathroom Toilet: Standard ?  ?  ?Home Equipment: Advice worker (2 wheels) ?  ?  ?  ? ?  ?Prior Functioning/Environment Prior Level of Function : Independent/Modified Independent ?  ?  ?  ?  ?  ?  ?Mobility Comments: Very active, enjoys walking on the treadmill, resistance training ?ADLs Comments: ADLs, IADLs, and works part time as a Biochemist, clinical (middle school and high school). ?  ? ?  ?  ?OT Problem List: Decreased range of motion;Decreased knowledge of precautions ?  ?   ?OT Treatment/Interventions: Self-care/ADL training;Therapeutic exercise;Energy conservation;DME and/or AE  instruction;Therapeutic activities;Patient/family education  ?  ?OT Goals(Current goals can be found in the care plan section) Acute Rehab OT Goals ?Patient Stated Goal: Go home ?OT Goal Formulation: With patient ?Time For Goal Achievement: 12/03/21 ?Potential to Achieve Goals: Good  ?OT Frequency: Min 2X/week ?  ? ?Co-evaluation   ?  ?  ?  ?  ? ?  ?AM-PAC OT "6 Clicks" Daily Activity     ?Outcome Measure Help from another person eating meals?: None ?Help from another person taking care of personal grooming?: A Little ?Help from another person toileting, which includes using toliet, bedpan, or urinal?: A Little ?Help from another person bathing (including washing, rinsing, drying)?: A Lot ?Help from another person to put on and taking off regular upper body clothing?: A Little ?Help from another person to put on and taking off regular lower body clothing?: A Lot ?6 Click Score: 17 ?  ?End of Session Equipment Utilized During Treatment: Rolling walker (2 wheels);Oxygen ?Nurse Communication: Mobility status ? ?Activity Tolerance: Patient tolerated treatment well;Patient limited by fatigue ?Patient left: in chair;with call bell/phone within reach;with nursing/sitter in room ? ?OT Visit Diagnosis: Other abnormalities of gait and mobility (R26.89);Unsteadiness on feet (R26.81);Muscle weakness (generalized) (M62.81)  ?              ?  Time: [phone] ?OT Time Calculation (min): 32 min ?Charges:  OT General Charges ?$OT Visit: 1 Visit ?OT Evaluation ?$OT Eval Moderate Complexity: 1 Mod ?OT Treatments ?$Self Care/Home Management : 8-22 mins ? ?Bryan Tyler MSOT, OTR/L ?Acute Rehab ?Pager: [phone] ?Office: [phone] ? ?Bryan Tyler ?11/19/2021, 10:15 AM ?

## 2021-11-19 NOTE — Progress Notes (Signed)
? ?NAME:  EVIAN SALGUERO, MRN:  [id number], DOB:  13-Feb-1946, LOS: 14 ?ADMISSION DATE:  10/09/2021, CONSULTATION DATE:  3/28 ?REFERRING MD:  Lingamgunta, [place], CHIEF COMPLAINT:  Dyspnea  ? ?BRIEF  ?This is a pleasant 76 year old male with a past medical history significant for idiopathic pulmonary fibrosis who has been followed by my partners in the pulmonary clinic for the same for several years who presents to our facility on transfer from [facility] in [facility] with a diagnosis of acute respiratory failure with hypoxemia due to an IPF exacerbation.  The patient has previously been followed by Dr. Vaughan Browner and has been enrolled in a clinical trial since January of this year entitled:  ?[id number] (FibroGen Study) is a Phase 3, randomized, double-blind, placebo-controlled multicenter international study to evaluate the efficacy and safety of 30 mg/kg IV infusions of pamrevlumab administered every 3 weeks for 52 weeks as compared to placebo in subjects with Idiopathic Pulmonary Fibrosis. Primary end point is: change in FVC from baseline at week 52. ZEPHYRUS II STUDY. ? ?The patient tells me that in prior years he was intolerant of antifibrotic agents for pulmonary fibrosis.  However, he has remained active, and says that he was not using oxygen prior to admission.  Approximately 1 week prior to admission he said that after going to the gym he felt "a little different" but did not complain of shortness of breath or cough.  On Friday, March 23 he traveled to [facility] where shortly after arrival he began to become very short of breath with associated headache and dry cough.  He denies fevers chills, rash, nausea, vomiting, chest pain or sinus complaint.  He is unaware of any sick contacts.  After 2 days of dyspnea his son-in-law took him to the emergency department in [place] where he was admitted for severe acute respiratory failure with hypoxemia by the hospitalist service.   Pulmonary was consulted at [facility], specifically Drs. Posey Pronto and [place].  The patient had a work-up in [place] which included a respiratory viral panel which was negative for COVID and flu however an extended panel showed rhinovirus infection.  Lab work showed a BNP which is mildly elevated at 136 troponin was slightly elevated.  He had a CT angiogram of his chest which showed no pulmonary embolism but he did have severe pulmonary fibrosis and bilateral peripheral opacification.  He was treated with IV steroids, azithromycin and cefepime.  An echocardiogram was performed.  On admission his oxygen needs were 6 L of nasal cannula (March 25) but he was moved to the intensive care unit for progressive worsening of oxygenation and required heated high flow.  Request for transfer to [name] was made on March 27 but there were no beds available so he was moved today March 28.  By the time of being moved he was transferred on Optiflow (heated high flow) at 40 L/min and 60% FiO2. ? ?Other lab shows a mycoplasma IgM MRSA swab negative basic metabolic panel is normal troponin slightly 77, she is normal with the exception of a slightly elevated [name].  This is 1.2/dL ? ?Pertinent  Medical History  ?Idiopathic pulmonary fibrosis ?Adenocarcinoma of the prostate ?Thoracic aortic aneurysm ?Anxiety ?Depression ?Diverticulosis ?GERD ?Hyperlipidemia ?Hypertension ?Hypothyroidism ?Obstructive sleep apnea, not on CPAP ?Osteoporosis ? ?Significant Hospital Events: ?Including procedures, antibiotic start and stop dates in addition to other pertinent events   ?March 25, admitted to [facility] in [place], pulmonary consulted, treated with steroids, ceftriaxone, azithromycin for an  IPF flare, noted to be rhinovirus positive.  COVID and flu testing negative ?March 26 moved to the intensive care unit for heated high flow ?March 28 transferred to [facility] long hospital ?3/31 On 20 L / 60% ?4/2 HFNC 30L/ 50%, continues to  desaturate w/minimal exertion ?4/3 and 4/4 no changes, remains on 50-60%, 30L and NRB w/exertion.  Sed rate wnl, PCT neg ?4/7 transferred to [facility] for PLEX protocol.  ?4/8 PLEX #1 ?4/10 Plex #2  ? ?Interim History / Subjective:  ?He denies complaints. Breathing is about the same. Remains on high FiO2 and flow rates.  ? ?Objective   ?Blood pressure 125/71, pulse 88, temperature (!) 97.5 ?F (36.4 ?C), temperature source Axillary, resp. rate (!) 42, height '5\' 9"'$  (1.753 m), weight 77.8 kg, SpO2 95 %. ?   ?FiO2 (%):  [70 %-80 %] 70 %  ? ?Intake/Output Summary (Last 24 hours) at 11/19/2021 0907 ?Last data filed at 11/19/2021 0300 ?Gross per 24 hour  ?Intake 120 ml  ?Output 800 ml  ?Net -680 ml  ? ? ?Filed Weights  ? 11/18/21 0500 11/18/21 0830 11/19/21 0600  ?Weight: 76 kg 79.8 kg 77.8 kg  ? ? ?Examination:  ?General Appearance: Ill-appearing man sitting up in the chair no acute distress ?Head: [place]/AT, eyes anicteric oriented ?Neck: RIJ Dialysis catheter ?Lungs: Tachypnea, rales bilaterally. ?Heart: S1-S2, regular rate and rhythm ?Abdomen: Soft, nontender, nondistended ?Extremities: No peripheral edema, no cyanosis ?Skin: No diffuse rashes, no erythema around dialysis catheter. ?Neurologic: Awake and alert, CAM-ICU negative.  Moving all extremities.  Answer questions appropriately. ? ?WBC 27 ?H/H 13.3/40.8 ?BUN 36 ?Cr 0.72 ? ?Resolved problems list:   ? ? ?Assessment & Plan:  ? ?Acute hypoxic respiratory failure requiring heated high flow oxygen due to rhinovirus- induced IPF flare. Not on home O2 PTA. ?-Continue supplemental oxygen as required to maintain SpO2 > 90%.  Oxygen requirements have remained relatively high, 30 to 35 L, 70 to 80%. ?-Progressive mobility as able.  Right now still has significant desaturations with any movement. ?- Continue with Plex treatment #3 for experimental therapy.  Will determine if we will proceed to rituximab.  After reviewing protocol, it appears that additional Plex treatments were in the  original protocol past the initial 3 days. ?-Continue prednisone and prophylactic Bactrim. ?-I have reached out to [facility] to determine if he will still receive his protocol experimental treatment for the trial he is enrolled in> still waiting to hear. ?-Pulmonary hygiene ?-Requires ongoing ICU care for PLEX and severe respiratory failure ? ?- autoantibody reduction protocol - (https://journals.plos.org/plosone/article?id=10.1371/journal.pone.7681157) ?- PLEX mixed with IvIG and Rituxan - try PLEX for first 3 days and then reasess x total 15 day protocol ?- questions can call [facility] ?- patient explained about the fact there is 1 study evidence  of improved outcomes and currently is also being studied in RCT and anecdotally at [facility] with good success ?- Research Drug Pamrevlumab (IP v placebo) infusion - 3rd dose (week #6) due 11/15/21 - 11/22/21-->  [facility] team will check with sponsor [employer] and if patient willing will aim to infuse IV MAB as inpatient ? ?Anxiety ?Depression  ?-Continue clonazepam, gabapentin, hydroxyzine, Effexor, Abilify ? ?Hypothyroidism ?-Continue Synthroid ? ?Steroid- induced hyperglycemia ?-SSI PRN ?-Goal BG 140-180 if requiring insulin ?-Overall insulin requirements have been low. ? ?BPH ?Hx prostate cancer  ?-Continue tamsulosin ? ?GERD ?-Continue PPI ? ?HTN ?-Continue amlodipine ? ?HLD ?-Continue atorvastatin ? ? ?Best Practice (right click and "Reselect all SmartList Selections" daily)  ? ?  Diet/type: Full diet ?DVT prophylaxis: LMWH ?GI prophylaxis: PPI ?Lines: N/A ?Foley:  N/A ?Code Status:  DNR but full medical care ? ?Last date of multidisciplinary goals of care discussion [4/3] Pt updated daily  - wife updated separately over phone 11/15/2021 10:46 AM ? ? ?Dr Chase Caller avail for questions via cell/email through afternoon 11/17/21 and then again 11/19/21 onwards ? ? ?This patient is critically ill with multiple organ system failure which requires frequent high complexity  decision making, assessment, support, evaluation, and titration of therapies. This was completed through the application of advanced monitoring technologies and extensive interpretation of multiple databases. During

## 2021-11-19 NOTE — Progress Notes (Signed)
Physical Therapy Treatment ?Patient Details ?Name: Bryan Tyler ?MRN: [id number] ?DOB: 1946-02-14 ?Today's Date: 11/19/2021 ? ? ?History of Present Illness 76 yo male admitted 11/15/2021 with idiopathic pulm fibrosis exacerbation, acute resp failure. 3/31 on 20L / 60%. 4/2 HFNC 30L/50%. 4/7 transferred to [facility] for PLEX protocol. Hx of IPF, OSA, osteoporosis ? ?  ?PT Comments  ? ? Pt received up in chair; pleasant and agreeable to therapy session. Focus on seated exercises, postural re-education and transfers. Pt requiring min guard assist for transfers to standing. SpO2 > 80% on 35L O2, 70% FiO2 + 15L NRB. RR up to 50-60 at times with exercise. Use of visual imagery and verbal cueing (Ibreathe app) to control respiratory rate for recovery. Will continue to progress as tolerated. ?   ?Recommendations for follow up therapy are one component of a multi-disciplinary discharge planning process, led by the attending physician.  Recommendations may be updated based on patient status, additional functional criteria and insurance authorization. ? ?Follow Up Recommendations ? Home health PT (pending medical progression) ?  ?  ?Assistance Recommended at Discharge Frequent or constant Supervision/Assistance  ?Patient can return home with the following A little help with walking and/or transfers;A little help with bathing/dressing/bathroom;Assistance with cooking/housework;Assist for transportation;Help with stairs or ramp for entrance ?  ?Equipment Recommendations ? Rollator (4 wheels)  ?  ?Recommendations for Other Services   ? ? ?  ?Precautions / Restrictions Precautions ?Precautions: Other (comment) ?Precaution Comments: HFNC + NRB for mobility, watch O2/RR ?Restrictions ?Weight Bearing Restrictions: No  ?  ? ?Mobility ? Bed Mobility ?  ?  ?  ?  ?  ?  ?  ?General bed mobility comments: OOB in chair ?  ? ?Transfers ?Overall transfer level: Needs assistance ?Equipment used: None ?Transfers: Sit to/from Stand ?Sit to Stand: Min  guard ?  ?  ?  ?  ?  ?General transfer comment: Min guard to stand x 2 ?  ? ?Ambulation/Gait ?  ?  ?  ?  ?  ?  ?  ?  ? ? ?Stairs ?  ?  ?  ?  ?  ? ? ?Wheelchair Mobility ?  ? ?Modified Rankin (Stroke Patients Only) ?  ? ? ?  ?Balance Overall balance assessment: Mild deficits observed, not formally tested ?  ?  ?  ?  ?  ?  ?  ?  ?  ?  ?  ?  ?  ?  ?  ?  ?  ?  ?  ? ?  ?Cognition Arousal/Alertness: Awake/alert ?Behavior During Therapy: [facility] for tasks assessed/performed ?Overall Cognitive Status: Within Functional Limits for tasks assessed ?  ?  ?  ?  ?  ?  ?  ?  ?  ?  ?  ?  ?  ?  ?  ?  ?  ?  ?  ? ?  ?Exercises General Exercises - Lower Extremity ?Long Arc Quad: Both, 10 reps, Seated ?Hip ABduction/ADduction: Both, 10 reps, Seated ?Hip Flexion/Marching: Both, 10 reps, Seated ?Heel Raises: Other (comment), Standing (2 reps) ?Other Exercises ?Other Exercises: Seated: scapular retractions x 5, backward shoulder rolls x 5 ?Other Exercises: Ibreathe app to control breathing and RR; x2 cycles of 2 minutes each ? ?  ?General Comments   ?  ?  ? ?Pertinent Vitals/Pain Pain Assessment ?Pain Assessment: Faces ?Faces Pain Scale: No hurt  ? ? ?Home Living   ?  ?  ?  ?  ?  ?  ?  ?  ?  ?   ?  ?  Prior Function    ?  ?  ?   ? ?PT Goals (current goals can now be found in the care plan section) Acute Rehab PT Goals ?Patient Stated Goal: to regain PLOF ?Potential to Achieve Goals: Good ?Progress towards PT goals: Progressing toward goals ? ?  ?Frequency ? ? ? Min 3X/week ? ? ? ?  ?PT Plan Current plan remains appropriate  ? ? ?Co-evaluation   ?  ?  ?  ?  ? ?  ?AM-PAC PT "6 Clicks" Mobility   ?Outcome Measure ? Help needed turning from your back to your side while in a flat bed without using bedrails?: A Little ?Help needed moving from lying on your back to sitting on the side of a flat bed without using bedrails?: A Little ?Help needed moving to and from a bed to a chair (including a wheelchair)?: A Little ?Help needed standing up from a chair  using your arms (e.g., wheelchair or bedside chair)?: A Little ?Help needed to walk in hospital room?: Total ?Help needed climbing 3-5 steps with a railing? : Total ?6 Click Score: 14 ? ?  ?End of Session Equipment Utilized During Treatment: Oxygen ?Activity Tolerance: Other (comment) (limited by dyspnea) ?Patient left: in chair;with call bell/phone within reach ?Nurse Communication: Mobility status ?PT Visit Diagnosis: Difficulty in walking, not elsewhere classified (R26.2);Muscle weakness (generalized) (M62.81) ?  ? ? ?Time: 4166-0630 ?PT Time Calculation (min) (ACUTE ONLY): 25 min ? ?Charges:  $Therapeutic Exercise: 8-22 mins ?$Therapeutic Activity: 8-22 mins          ?          ? ?Wyona Almas, PT, DPT ?Acute Rehabilitation Services ?Pager [phone] ?Office [phone] ? ? ? ?Carloine Margo Aye ?11/19/2021, 3:39 PM ? ?

## 2021-11-20 DIAGNOSIS — J84112 Idiopathic pulmonary fibrosis: Secondary | ICD-10-CM | POA: Diagnosis not present

## 2021-11-20 DIAGNOSIS — Z7189 Other specified counseling: Secondary | ICD-10-CM | POA: Diagnosis not present

## 2021-11-20 DIAGNOSIS — J9601 Acute respiratory failure with hypoxia: Secondary | ICD-10-CM | POA: Diagnosis not present

## 2021-11-20 LAB — CBC WITH DIFFERENTIAL/PLATELET
Abs Immature Granulocytes: 0.4 10*3/uL — ABNORMAL HIGH (ref 0.00–0.07)
Basophils Absolute: 0 10*3/uL (ref 0.0–0.1)
Basophils Relative: 0 %
Eosinophils Absolute: 0 10*3/uL (ref 0.0–0.5)
Eosinophils Relative: 0 %
HCT: 36.6 % — ABNORMAL LOW (ref 39.0–52.0)
Hemoglobin: 11.8 g/dL — ABNORMAL LOW (ref 13.0–17.0)
Immature Granulocytes: 2 %
Lymphocytes Relative: 6 %
Lymphs Abs: 1.3 10*3/uL (ref 0.7–4.0)
MCH: 28.4 pg (ref 26.0–34.0)
MCHC: 32.2 g/dL (ref 30.0–36.0)
MCV: 88 fL (ref 80.0–100.0)
Monocytes Absolute: 0.5 10*3/uL (ref 0.1–1.0)
Monocytes Relative: 2 %
Neutro Abs: 20.5 10*3/uL — ABNORMAL HIGH (ref 1.7–7.7)
Neutrophils Relative %: 90 %
Platelets: 151 10*3/uL (ref 150–400)
RBC: 4.16 MIL/uL — ABNORMAL LOW (ref 4.22–5.81)
RDW: 17.3 % — ABNORMAL HIGH (ref 11.5–15.5)
WBC: 22.7 10*3/uL — ABNORMAL HIGH (ref 4.0–10.5)
nRBC: 0 % (ref 0.0–0.2)

## 2021-11-20 LAB — BASIC METABOLIC PANEL
Anion gap: 4 — ABNORMAL LOW (ref 5–15)
BUN: 34 mg/dL — ABNORMAL HIGH (ref 8–23)
CO2: 28 mmol/L (ref 22–32)
Calcium: 7.7 mg/dL — ABNORMAL LOW (ref 8.9–10.3)
Chloride: 105 mmol/L (ref 98–111)
Creatinine, Ser: 0.63 mg/dL (ref 0.61–1.24)
GFR, Estimated: >60 mL/min (ref >60)
Glucose, Bld: 151 mg/dL — ABNORMAL HIGH (ref 70–99)
Potassium: 4.8 mmol/L (ref 3.5–5.1)
Sodium: 137 mmol/L (ref 135–145)

## 2021-11-20 LAB — GLUCOSE, CAPILLARY
Glucose-Capillary: 143 mg/dL — ABNORMAL HIGH (ref 70–99)
Glucose-Capillary: 172 mg/dL — ABNORMAL HIGH (ref 70–99)
Glucose-Capillary: 191 mg/dL — ABNORMAL HIGH (ref 70–99)
Glucose-Capillary: 205 mg/dL — ABNORMAL HIGH (ref 70–99)

## 2021-11-20 MED ORDER — CLONAZEPAM 0.5 MG PO TABS: 0.5000 mg | ORAL_TABLET | Freq: Two times a day (BID) | ORAL | Status: DC

## 2021-11-20 MED ORDER — LORAZEPAM 2 MG/ML IJ SOLN
0.5000 mg | Freq: Four times a day (QID) | INTRAMUSCULAR | Status: DC | PRN
  Administered 2021-11-20 – 2021-11-21 (×3): 0.5 mg via INTRAVENOUS
  Filled 2021-11-20 (×3): qty 1

## 2021-11-20 MED ORDER — CLONAZEPAM 1 MG PO TABS
1.5000 mg | ORAL_TABLET | Freq: Two times a day (BID) | ORAL | Status: DC
  Administered 2021-11-20 (×2): 1.5 mg via ORAL
  Filled 2021-11-20 (×2): qty 1

## 2021-11-20 NOTE — Progress Notes (Addendum)
Nephrology Follow-Up Consult note ? ? ?Assessment/Recommendations: Bryan Tyler is a/an 76 y.o. male with a past medical history significant for IPF, admitted for IPF exacerbation.    ? ? ? ?IPF exacerbation: Life-threatening hypoxic respiratory failure.  Primary team asked for apheresis given the patients enrollment in a trial which suggest treatment with apharesis.  He has tolerated 3 treatments.  I do not see any signs of meaningful improvement.  Discussed with team related to his trial who feels he may still have some benefit.  We will perform 2 more treatments and then reassess; on 4/13 and 4/14.  I do not think he is receiving significant benefit thus far and if he is not responding after his treatment on Friday we will likely not continue if further assess at that time.  Using albumin as replacement.  No signs of bleeding ? ?Acute hypoxic respiratory failure: Supplemental oxygen per primary team ? ?Hypertension: Continue home medications ? ?Hypothyroidism: Home medications ? ?Anxiety/depression: Mood and affect appropriate today ? ? ?Recommendations conveyed to primary service.  ? ? ?Reesa Chew ?[facility] ?11/20/2021 ?9:06 AM ? ?___________________________________________________________ ? ?CC: acute on chronic hypoxic respiratory failure ? ?Interval History/Subjective: Patient states he feels about the same today as he did yesterday.  He does appear more short of breath today.  Oxygen requirement slightly increased.  Tolerated plasma exchange yesterday ? ? ?Medications:  ?Current Facility-Administered Medications  ?Medication Dose Route Frequency Provider Last Rate Last Admin  ? acetaminophen (TYLENOL) tablet 650 mg  650 mg Oral Q4H PRN Roney Jaffe, MD   650 mg at 11/18/21 1939  ? albuterol (PROVENTIL) (2.5 MG/3ML) 0.083% nebulizer solution 2.5 mg  2.5 mg Nebulization Q2H PRN Simonne Maffucci B, MD      ? amLODipine (NORVASC) tablet 5 mg  5 mg Oral Daily Juanito Doom, MD    5 mg at 11/19/21 0913  ? ARIPiprazole (ABILIFY) tablet 2 mg  2 mg Oral Daily Simonne Maffucci B, MD   2 mg at 11/19/21 0913  ? atorvastatin (LIPITOR) tablet 40 mg  40 mg Oral QPM Simonne Maffucci B, MD   40 mg at 11/19/21 1733  ? azelastine (ASTELIN) 0.1 % nasal spray 2 spray  2 spray Each Nare BID Juanito Doom, MD   2 spray at 11/19/21 0914  ? benzonatate (TESSALON) capsule 100 mg  100 mg Oral TID PRN Elsie Lincoln, MD   100 mg at 11/20/21 8413  ? calcium gluconate inj 10% (1 g) URGENT USE ONLY!  2 g Intravenous Once Roney Jaffe, MD      ? Chlorhexidine Gluconate Cloth 2 % PADS 6 each  6 each Topical Daily Juanito Doom, MD   6 each at 11/19/21 0914  ? citrate dextrose (ACD-A anticoagulant) solution 1,000 mL  1,000 mL Other Continuous Roney Jaffe, MD   1,000 mL at 11/18/21 0840  ? citrate dextrose (ACD-A anticoagulant) solution 1,000 mL  1,000 mL Other Continuous Roney Jaffe, MD   1,000 mL at 11/19/21 2140  ? clonazePAM (KLONOPIN) tablet 1.5 mg  1.5 mg Oral BID Noemi Chapel P, DO      ? diphenhydrAMINE (BENADRYL) capsule 25 mg  25 mg Oral Q6H PRN Roney Jaffe, MD      ? docusate sodium (COLACE) capsule 100 mg  100 mg Oral BID PRN Simonne Maffucci B, MD   100 mg at 11/17/21 0913  ? enoxaparin (LOVENOX) injection 40 mg  40 mg Subcutaneous Q24H Juanito Doom, MD  40 mg at 11/19/21 0912  ? feeding supplement (ENSURE ENLIVE / ENSURE PLUS) liquid 237 mL  237 mL Oral BID BM Agarwala, Einar Grad, MD   237 mL at 11/19/21 1324  ? fluticasone (FLONASE) 50 MCG/ACT nasal spray 2 spray  2 spray Each Nare Daily Juanito Doom, MD   2 spray at 11/19/21 2112  ? gabapentin (NEURONTIN) capsule 800 mg  800 mg Oral BID Simonne Maffucci B, MD   800 mg at 11/19/21 2103  ? guaiFENesin-dextromethorphan (ROBITUSSIN DM) 100-10 MG/5ML syrup 5 mL  5 mL Oral Q4H PRN Frederik Pear, MD   5 mL at 11/20/21 0419  ? heparin sodium (porcine) injection 1,000 Units  1,000 Units Intracatheter Once Roney Jaffe, MD       ? HYDROcodone-acetaminophen (NORCO/VICODIN) 5-325 MG per tablet 1 tablet  1 tablet Oral Q6H PRN Corey Harold, NP   1 tablet at 11/19/21 2106  ? hydrOXYzine (ATARAX) tablet 25 mg  25 mg Oral Q6H PRN Cristal Generous, NP   25 mg at 11/19/21 0913  ? insulin aspart (novoLOG) injection 0-15 Units  0-15 Units Subcutaneous TID WC Juanito Doom, MD   2 Units at 11/20/21 [phone]  ? insulin aspart (novoLOG) injection 0-5 Units  0-5 Units Subcutaneous QHS Simonne Maffucci B, MD      ? levothyroxine (SYNTHROID) tablet 112 mcg  112 mcg Oral Daily Juanito Doom, MD   112 mcg at 11/20/21 0542  ? LORazepam (ATIVAN) injection 0.5 mg  0.5 mg Intravenous Q6H PRN Julian Hy, DO      ? MEDLINE mouth rinse  15 mL Mouth Rinse BID Julian Hy, DO   15 mL at 11/19/21 2112  ? methylPREDNISolone sodium succinate (SOLU-MEDROL) 125 mg/2 mL injection 80 mg  80 mg Intravenous Q12H Simonne Maffucci B, MD   80 mg at 11/19/21 2104  ? multivitamin with minerals tablet 1 tablet  1 tablet Oral Daily Juanito Doom, MD   1 tablet at 11/19/21 0913  ? ondansetron (ZOFRAN) injection 4 mg  4 mg Intravenous Q6H PRN Juanito Doom, MD      ? pantoprazole (PROTONIX) EC tablet 40 mg  40 mg Oral Daily Juanito Doom, MD   40 mg at 11/19/21 0913  ? polyethylene glycol (MIRALAX / GLYCOLAX) packet 17 g  17 g Oral Daily PRN Simonne Maffucci B, MD      ? sodium chloride flush (NS) 0.9 % injection 10-40 mL  10-40 mL Intracatheter Q12H Julian Hy, DO   10 mL at 11/19/21 2113  ? sodium chloride flush (NS) 0.9 % injection 10-40 mL  10-40 mL Intracatheter PRN Julian Hy, DO      ? sulfamethoxazole-trimethoprim (BACTRIM DS) 800-160 MG per tablet 1 tablet  1 tablet Oral Once per day on Mon Wed Fri Candee Furbish, MD   1 tablet at 11/18/21 1039  ? tamsulosin (FLOMAX) capsule 0.4 mg  0.4 mg Oral Daily Simonne Maffucci B, MD   0.4 mg at 11/19/21 0913  ? venlafaxine XR (EFFEXOR-XR) 24 hr capsule 300 mg  300 mg Oral Daily Simonne Maffucci B,  MD   300 mg at 11/19/21 0913  ?  ? ? ?Review of Systems: ?10 systems reviewed and negative except per interval history/subjective ? ?Physical Exam: ?Vitals:  ? 11/20/21 0700 11/20/21 0757  ?BP: 119/64 119/64  ?Pulse: 83   ?Resp: (!) 35   ?Temp:    ?SpO2: 91% 90%  ? ?No  intake/output data recorded. ? ?Intake/Output Summary (Last 24 hours) at 11/20/2021 0906 ?Last data filed at 11/20/2021 0600 ?Gross per 24 hour  ?Intake 100 ml  ?Output 1625 ml  ?Net -1525 ml  ? ?Constitutional: Tired appearing, no acute distress ?ENMT: ears and nose without scars or lesions, MMM ?CV: normal rate, no edema ?Respiratory: Increased work of breathing, bilateral crackles, on supplemental oxygen ?Gastrointestinal: soft, non-tender, no palpable masses or hernias ?Skin: no visible lesions or rashes ?Psych: alert, judgement/insight appropriate, appropriate mood and affect ? ? ?Test Results ?I personally reviewed new and old clinical labs and radiology tests ?Lab Results  ?Component Value Date  ? NA 137 11/20/2021  ? K 4.8 11/20/2021  ? CL 105 11/20/2021  ? CO2 28 11/20/2021  ? BUN 34 (H) 11/20/2021  ? CREATININE 0.63 11/20/2021  ? GFR 83.70 06/11/2021  ? CALCIUM 7.7 (L) 11/20/2021  ? ALBUMIN 3.0 (L) 11/07/2021  ? PHOS 3.7 11/07/2021  ? ? ?CBC ?Recent Labs  ?Lab 11/18/21 ?0020 11/19/21 ?1308 11/20/21 ?0311  ?WBC 22.4* 27.0* 22.7*  ?NEUTROABS 20.0* 23.9* 20.5*  ?HGB 12.3* 13.3 11.8*  ?HCT 38.1* 40.8 36.6*  ?MCV 87.2 88.1 88.0  ?PLT 189 204 151  ? ? ? ? ? ?

## 2021-11-20 NOTE — Progress Notes (Signed)
eLink Physician-Brief Progress Note ?Patient Name: Bryan Tyler ?DOB: 1945/09/17 ?MRN: [id number] ? ? ?Date of Service ? 11/20/2021  ?HPI/Events of Note ? Hypoxia - Sat = 70-80% on HFNC 15 L.min + NRB. RT requesting trial of BiPAP.   ?eICU Interventions ? Plan: ?Trial of BiPAP.  ? ? ? ?Intervention Category ?Major Interventions: Hypoxemia - evaluation and management ? ?Debbe Crumble Cornelia Copa ?11/20/2021, 9:06 PM ?

## 2021-11-20 NOTE — Progress Notes (Signed)
? ?NAME:  Bryan Tyler, MRN:  [id number], DOB:  1945-11-26, LOS: 15 ?ADMISSION DATE:  10/22/2021, CONSULTATION DATE:  3/28 ?REFERRING MD:  Lingamgunta, [place], CHIEF COMPLAINT:  Dyspnea  ? ?BRIEF  ?This is a pleasant 76 year old male with a past medical history significant for idiopathic pulmonary fibrosis who has been followed by my partners in the pulmonary clinic for the same for several years who presents to our facility on transfer from [facility] in [facility] with a diagnosis of acute respiratory failure with hypoxemia due to an IPF exacerbation.  The patient has previously been followed by Dr. Vaughan Browner and has been enrolled in a clinical trial since January of this year entitled:  ?FGCL-3019-095 (FibroGen Study) is a Phase 3, randomized, double-blind, placebo-controlled multicenter international study to evaluate the efficacy and safety of 30 mg/kg IV infusions of pamrevlumab administered every 3 weeks for 52 weeks as compared to placebo in subjects with Idiopathic Pulmonary Fibrosis. Primary end point is: change in FVC from baseline at week 52. ZEPHYRUS II STUDY. ? ?The patient tells me that in prior years he was intolerant of antifibrotic agents for pulmonary fibrosis.  However, he has remained active, and says that he was not using oxygen prior to admission.  Approximately 1 week prior to admission he said that after going to the gym he felt "a little different" but did not complain of shortness of breath or cough.  On Friday, March 23 he traveled to [facility] where shortly after arrival he began to become very short of breath with associated headache and dry cough.  He denies fevers chills, rash, nausea, vomiting, chest pain or sinus complaint.  He is unaware of any sick contacts.  After 2 days of dyspnea his son-in-law took him to the emergency department in [place] where he was admitted for severe acute respiratory failure with hypoxemia by the hospitalist service.   Pulmonary was consulted at [facility], specifically Drs. Posey Pronto and [facility].  The patient had a work-up in [place] which included a respiratory viral panel which was negative for COVID and flu however an extended panel showed rhinovirus infection.  Lab work showed a BNP which is mildly elevated at 136 troponin was slightly elevated.  He had a CT angiogram of his chest which showed no pulmonary embolism but he did have severe pulmonary fibrosis and bilateral peripheral opacification.  He was treated with IV steroids, azithromycin and cefepime.  An echocardiogram was performed.  On admission his oxygen needs were 6 L of nasal cannula (March 25) but he was moved to the intensive care unit for progressive worsening of oxygenation and required heated high flow.  Request for transfer to [name] was made on March 27 but there were no beds available so he was moved today March 28.  By the time of being moved he was transferred on Optiflow (heated high flow) at 40 L/min and 60% FiO2. ? ?Other lab shows a mycoplasma IgM MRSA swab negative basic metabolic panel is normal troponin slightly 77, she is normal with the exception of a slightly elevated [name].  This is 1.2/dL ? ?Pertinent  Medical History  ?Idiopathic pulmonary fibrosis ?Adenocarcinoma of the prostate ?Thoracic aortic aneurysm ?Anxiety ?Depression ?Diverticulosis ?GERD ?Hyperlipidemia ?Hypertension ?Hypothyroidism ?Obstructive sleep apnea, not on CPAP ?Osteoporosis ? ?Significant Hospital Events: ?Including procedures, antibiotic start and stop dates in addition to other pertinent events   ?March 25, admitted to [facility] in [place], pulmonary consulted, treated with steroids, ceftriaxone, azithromycin for an  IPF flare, noted to be rhinovirus positive.  COVID and flu testing negative ?March 26 moved to the intensive care unit for heated high flow ?March 28 transferred to [facility] long hospital ?3/31 On 20 L / 60% ?4/2 HFNC 30L/ 50%, continues to  desaturate w/minimal exertion ?4/3 and 4/4 no changes, remains on 50-60%, 30L and NRB w/exertion.  Sed rate wnl, PCT neg ?4/7 transferred to [facility] for PLEX protocol.  ?4/8 PLEX #1 ?4/10 Plex #2  ?4/11 PLEX #3 ? ?Interim History / Subjective:  ?Feeling about the same, having some anxiety. Now on 40L, 80%. ? ?Objective   ?Blood pressure 119/64, pulse 83, temperature (!) 97.5 ?F (36.4 ?C), temperature source Axillary, resp. rate (!) 35, height '5\' 9"'$  (1.753 m), weight 78.1 kg, SpO2 90 %. ?   ?FiO2 (%):  [70 %-80 %] 80 %  ? ?Intake/Output Summary (Last 24 hours) at 11/20/2021 0800 ?Last data filed at 11/20/2021 0600 ?Gross per 24 hour  ?Intake 100 ml  ?Output 1625 ml  ?Net -1525 ml  ? ? ?Filed Weights  ? 11/18/21 0830 11/19/21 0600 11/20/21 0600  ?Weight: 79.8 kg 77.8 kg 78.1 kg  ? ? ?Examination:  ?General Appearance: ill appearing man sitting up in bed in NAD ?Head: [place]/AT, eyes anicteric ?Neck: RIJ HD catheter ?Lungs: tachypneic, worse conversational dyspnea today. Rhales bilaterally. ?Heart: S1S2, RRR ?Abdomen: soft, NT ?Extremities: no edema or cyanosis ?Skin: no rashes or diaphoresis ?Neurologic: awake, alert, answering questions appropriately, moving all extremities ? ?WBC 22.7 ?H/H 11.8/46.6 ?BUN 34 ?Cr 0.63 ? ?Resolved problems list:   ? ? ?Assessment & Plan:  ? ?Acute hypoxic respiratory failure requiring heated high flow oxygen due to rhinovirus- induced IPF flare. Not on home O2 PTA. ?-Con't supplemental O2 to maintain SpO2 >88% ?--Con't steroids, antibiotics ?-progressive mobility as able, but very limited by oxygen requriements ?--Discussed the investigational treatment that he is receiving per the protocol being studied at [facility]-- PLEX + rituximab + IVIG. Since he has failed to respond to the first 3 PLEX treatments, he is higher risk for being a non-responded to this regimen, potentially in the 25-50% chance group per the [place]. They have recommended continuing if he is tolerating the treatment  without adverse effects. I discussed this with him, and he wishes to remain hopeful and continue the trial protocol. I discussed with nephrology, and we will continue PLEX on 4/13 & 4/14. After PLEX on 4/14 we will reassess his response to determine if additional treatments are indicated. ?-will plan for rituximab on 4/14 after PLEX since it could be cleared partially by PLEX ?-Continue prednisone and prophylactic Bactrim. ?-I have reached out to [facility] to determine if he will still receive his protocol experimental treatment for the trial he is enrolled in> still waiting to hear. ?-Pulmonary hygiene ?-Requires ongoing ICU care for PLEX and severe respiratory failure. -Reconfirmed DNR code status. If his respiratory status worsens to the point that he cannot be supported without PPV, he will not survive this illness. At that point we will focus on treating his symptoms rather than continuing aggressive care. ? ?- autoantibody reduction protocol - (https://journals.plos.org/plosone/article?id=10.1371/journal.pone.4270623) ? ?- PLEX mixed with IvIG and Rituxan - try PLEX for first 3 days and then reasess x total 15 day protocol ?- questions can call [facility] ?- patient explained about the fact there is 1 study evidence  of improved outcomes and currently is also being studied in RCT and anecdotally at [facility] with good success ?- Research Drug  Pamrevlumab (IP v placebo) infusion - 3rd dose (week #6) due 11/15/21 - 11/22/21-->  [facility] team will check with sponsor [employer] and if patient willing will aim to infuse IV MAB as inpatient ? ?Anxiety ?Depression  ?-Continue gabapentin, hydroxyzine, Effexor, Abilify ?-increasing clonazepam to BID dosing and adding ativan PRN ? ?Hypothyroidism ?-con't synthroid ? ?Steroid- induced hyperglycemia ?-SSI PRN ?-goal BG 140-180 ? ?BPH ?Hx prostate cancer  ?-Con't tamsulosin ? ?GERD ?-Con't PPI ? ?HTN ?-controlled with daily amlodipine ? ?HLD ?-con't atorvastatin ? ? ?Best  Practice (right click and "Reselect all SmartList Selections" daily)  ? ?Diet/type: Full diet ?DVT prophylaxis: LMWH ?GI prophylaxis: PPI ?Lines: Dialysis Catheter ?Foley:  N/A ?Code Status:  DNR but full medical care

## 2021-11-20 NOTE — Progress Notes (Addendum)
Physical Therapy Treatment ?Patient Details ?Name: Bryan Tyler ?MRN: [id number] ?DOB: 09/09/1945 ?Today's Date: 11/20/2021 ? ? ?History of Present Illness 76 yo male admitted 11/15/2021 with idiopathic pulm fibrosis exacerbation, acute resp failure. 3/31 on 20L / 60%. 4/2 HFNC 30L/50%. 4/7 transferred to [facility] for PLEX protocol. Hx of IPF, OSA, osteoporosis ? ?  ?PT Comments  ? ? Pt received supine in bed; agreeable to physical therapy session. Focus on progressive mobility, seated exercises for strengthening and postural re-education and breath work. Pt with desaturation to 72% with transfer from bed to chair with HHFNC + NRB; RT present to adjust FiO2 to 90-100% for recovery. Once up in chair, worked on segmental breathing with tactile cueing for decreasing RR and increasing SpO2 with good success. Utilized "spa" music for relaxation. Pt appreciative of efforts. ?  ?Recommendations for follow up therapy are one component of a multi-disciplinary discharge planning process, led by the attending physician.  Recommendations may be updated based on patient status, additional functional criteria and insurance authorization. ? ?Follow Up Recommendations ? Home health PT (pending medical progression) ?  ?  ?Assistance Recommended at Discharge Frequent or constant Supervision/Assistance  ?Patient can return home with the following A little help with walking and/or transfers;A little help with bathing/dressing/bathroom;Assistance with cooking/housework;Assist for transportation;Help with stairs or ramp for entrance ?  ?Equipment Recommendations ? Rollator (4 wheels)  ?  ?Recommendations for Other Services   ? ? ?  ?Precautions / Restrictions Precautions ?Precautions: Other (comment) ?Precaution Comments: HFNC + NRB for mobility, watch O2/RR ?Restrictions ?Weight Bearing Restrictions: No  ?  ? ?Mobility ? Bed Mobility ?Overal bed mobility: Needs Assistance ?Bed Mobility: Supine to Sit ?  ?  ?Supine to sit: Supervision ?  ?  ?   ?  ? ?Transfers ?Overall transfer level: Needs assistance ?Equipment used: Rolling walker (2 wheels) ?Transfers: Sit to/from Stand, Bed to chair/wheelchair/BSC ?Sit to Stand: Min assist ?Stand pivot transfers: Min assist ?  ?  ?  ?  ?General transfer comment: Light minA for steadying assist ?  ? ?Ambulation/Gait ?  ?  ?  ?  ?  ?  ?  ?  ? ? ?Stairs ?  ?  ?  ?  ?  ? ? ?Wheelchair Mobility ?  ? ?Modified Rankin (Stroke Patients Only) ?  ? ? ?  ?Balance Overall balance assessment: Mild deficits observed, not formally tested ?  ?  ?  ?  ?  ?  ?  ?  ?  ?  ?  ?  ?  ?  ?  ?  ?  ?  ?  ? ?  ?Cognition Arousal/Alertness: Awake/alert ?Behavior During Therapy: [name] for tasks assessed/performed ?Overall Cognitive Status: Within Functional Limits for tasks assessed ?  ?  ?  ?  ?  ?  ?  ?  ?  ?  ?  ?  ?  ?  ?  ?  ?  ?  ?  ? ?  ?Exercises General Exercises - Lower Extremity ?Long Arc Quad: Both, 10 reps, Seated ?Hip Flexion/Marching: Both, 10 reps, Seated ?Heel Raises: Both, 10 reps, Seated ?Other Exercises ?Other Exercises: Seated: scapular retractions x 5, backward shoulder rolls x 5, shoulder shrugs x 5, cervical rotation to R/L x 5 each, cervical flexion/extension x 5 each ?Other Exercises: Seated: segmental breathing with tacile cueing; visualization of lungs expanding up and down, side to side ? ?  ?General Comments   ?  ?  ? ?Pertinent Vitals/Pain  Pain Assessment ?Pain Assessment: Faces ?Faces Pain Scale: No hurt  ? ? ?Home Living   ?  ?  ?  ?  ?  ?  ?  ?  ?  ?   ?  ?Prior Function    ?  ?  ?   ? ?PT Goals (current goals can now be found in the care plan section) Acute Rehab PT Goals ?Patient Stated Goal: to regain PLOF ?Potential to Achieve Goals: Fair ? ?  ?Frequency ? ? ? Min 3X/week ? ? ? ?  ?PT Plan Current plan remains appropriate  ? ? ?Co-evaluation   ?  ?  ?  ?  ? ?  ?AM-PAC PT "6 Clicks" Mobility   ?Outcome Measure ? Help needed turning from your back to your side while in a flat bed without using bedrails?: A  Little ?Help needed moving from lying on your back to sitting on the side of a flat bed without using bedrails?: A Little ?Help needed moving to and from a bed to a chair (including a wheelchair)?: A Little ?Help needed standing up from a chair using your arms (e.g., wheelchair or bedside chair)?: A Little ?Help needed to walk in hospital room?: Total ?Help needed climbing 3-5 steps with a railing? : Total ?6 Click Score: 14 ? ?  ?End of Session Equipment Utilized During Treatment: Oxygen ?Activity Tolerance: Patient tolerated treatment well ?Patient left: in chair;with call bell/phone within reach ?Nurse Communication: Mobility status ?PT Visit Diagnosis: Difficulty in walking, not elsewhere classified (R26.2);Muscle weakness (generalized) (M62.81) ?  ? ? ?Time: 7948-0165 ?PT Time Calculation (min) (ACUTE ONLY): 30 min ? ?Charges:  $Therapeutic Exercise: 8-22 mins ?$Therapeutic Activity: 8-22 mins          ?          ? ?Wyona Almas, PT, DPT ?Acute Rehabilitation Services ?Pager [phone] ?Office [phone] ? ? ? ?Carloine Margo Aye ?11/20/2021, 4:47 PM ? ?

## 2021-11-21 DIAGNOSIS — J84112 Idiopathic pulmonary fibrosis: Secondary | ICD-10-CM | POA: Diagnosis not present

## 2021-11-21 DIAGNOSIS — J9601 Acute respiratory failure with hypoxia: Secondary | ICD-10-CM | POA: Diagnosis not present

## 2021-11-21 LAB — BASIC METABOLIC PANEL
Anion gap: 4 — ABNORMAL LOW (ref 5–15)
BUN: 39 mg/dL — ABNORMAL HIGH (ref 8–23)
CO2: 28 mmol/L (ref 22–32)
Calcium: 8.6 mg/dL — ABNORMAL LOW (ref 8.9–10.3)
Chloride: 102 mmol/L (ref 98–111)
Creatinine, Ser: 0.65 mg/dL (ref 0.61–1.24)
GFR, Estimated: >60 mL/min (ref >60)
Glucose, Bld: 160 mg/dL — ABNORMAL HIGH (ref 70–99)
Potassium: 5.6 mmol/L — ABNORMAL HIGH (ref 3.5–5.1)
Sodium: 134 mmol/L — ABNORMAL LOW (ref 135–145)

## 2021-11-21 LAB — CBC WITH DIFFERENTIAL/PLATELET
Abs Immature Granulocytes: 0.65 10*3/uL — ABNORMAL HIGH (ref 0.00–0.07)
Basophils Absolute: 0.1 10*3/uL (ref 0.0–0.1)
Basophils Relative: 0 %
Eosinophils Absolute: 0 10*3/uL (ref 0.0–0.5)
Eosinophils Relative: 0 %
HCT: 41.1 % (ref 39.0–52.0)
Hemoglobin: 13 g/dL (ref 13.0–17.0)
Immature Granulocytes: 2 %
Lymphocytes Relative: 6 %
Lymphs Abs: 1.9 10*3/uL (ref 0.7–4.0)
MCH: 27.5 pg (ref 26.0–34.0)
MCHC: 31.6 g/dL (ref 30.0–36.0)
MCV: 87.1 fL (ref 80.0–100.0)
Monocytes Absolute: 0.9 10*3/uL (ref 0.1–1.0)
Monocytes Relative: 3 %
Neutro Abs: 30.2 10*3/uL — ABNORMAL HIGH (ref 1.7–7.7)
Neutrophils Relative %: 89 %
Platelets: 177 10*3/uL (ref 150–400)
RBC: 4.72 MIL/uL (ref 4.22–5.81)
RDW: 17.8 % — ABNORMAL HIGH (ref 11.5–15.5)
WBC: 33.7 10*3/uL — ABNORMAL HIGH (ref 4.0–10.5)
nRBC: 0 % (ref 0.0–0.2)

## 2021-11-21 LAB — GLUCOSE, CAPILLARY
Glucose-Capillary: 148 mg/dL — ABNORMAL HIGH (ref 70–99)
Glucose-Capillary: 282 mg/dL — ABNORMAL HIGH (ref 70–99)

## 2021-11-21 MED ORDER — MORPHINE SULFATE (PF) 2 MG/ML IV SOLN
1.0000 mg | INTRAVENOUS | Status: DC | PRN
  Filled 2021-11-21: qty 1

## 2021-11-21 MED ORDER — ONDANSETRON 4 MG PO TBDP
4.0000 mg | ORAL_TABLET | Freq: Four times a day (QID) | ORAL | Status: DC | PRN
Start: 2021-11-21 — End: 2021-11-21
  Filled 2021-11-21: qty 1

## 2021-11-21 MED ORDER — SODIUM CHLORIDE 0.9 % IV SOLN
INTRAVENOUS | Status: DC
  Filled 2021-11-21 (×3): qty 200

## 2021-11-21 MED ORDER — CALCIUM GLUCONATE-NACL 2-0.675 GM/100ML-% IV SOLN: 2.0000 g | Freq: Once | INTRAVENOUS | Status: DC

## 2021-11-21 MED ORDER — DIPHENHYDRAMINE HCL 25 MG PO CAPS: 25.0000 mg | ORAL_CAPSULE | Freq: Four times a day (QID) | ORAL | Status: DC | PRN

## 2021-11-21 MED ORDER — MORPHINE SULFATE (PF) 2 MG/ML IV SOLN: 2.0000 mg | INTRAVENOUS | Status: DC | PRN

## 2021-11-21 MED ORDER — LORAZEPAM 2 MG/ML IJ SOLN: 2.0000 mg | INTRAMUSCULAR | Status: DC | PRN

## 2021-11-21 MED ORDER — LORAZEPAM 2 MG/ML IJ SOLN
0.5000 mg | Freq: Four times a day (QID) | INTRAMUSCULAR | Status: DC | PRN
  Administered 2021-11-21: 2 mg via INTRAVENOUS
  Filled 2021-11-21: qty 1

## 2021-11-21 MED ORDER — ACETAMINOPHEN 325 MG PO TABS: 650.0000 mg | ORAL_TABLET | Freq: Four times a day (QID) | ORAL | Status: DC | PRN

## 2021-11-21 MED ORDER — GLYCOPYRROLATE 0.2 MG/ML IJ SOLN: 0.2000 mg | INTRAMUSCULAR | Status: DC | PRN

## 2021-11-21 MED ORDER — ACETAMINOPHEN 325 MG PO TABS: 650.0000 mg | ORAL_TABLET | ORAL | Status: DC | PRN

## 2021-11-21 MED ORDER — GLYCOPYRROLATE 1 MG PO TABS
1.0000 mg | ORAL_TABLET | ORAL | Status: DC | PRN
  Filled 2021-11-21: qty 1

## 2021-11-21 MED ORDER — CALCIUM CARBONATE ANTACID 500 MG PO CHEW: 2.0000 | CHEWABLE_TABLET | ORAL | Status: DC

## 2021-11-21 MED ORDER — ACD FORMULA A 0.73-2.45-2.2 GM/100ML VI SOLN: 1000.0000 mL | Status: DC

## 2021-11-21 MED ORDER — POLYVINYL ALCOHOL 1.4 % OP SOLN
1.0000 [drp] | Freq: Four times a day (QID) | OPHTHALMIC | Status: DC | PRN
  Filled 2021-11-21: qty 15

## 2021-11-21 MED ORDER — HEPARIN SODIUM (PORCINE) 1000 UNIT/ML IJ SOLN
1000.0000 [IU] | Freq: Once | INTRAMUSCULAR | Status: DC
  Filled 2021-11-21: qty 1

## 2021-11-21 MED ORDER — ACETAMINOPHEN 650 MG RE SUPP: 650.0000 mg | Freq: Four times a day (QID) | RECTAL | Status: DC | PRN

## 2021-11-21 MED ORDER — SODIUM ZIRCONIUM CYCLOSILICATE 10 G PO PACK
10.0000 g | PACK | Freq: Once | ORAL | Status: DC
  Filled 2021-11-21: qty 1

## 2021-11-21 MED ORDER — ONDANSETRON HCL 4 MG/2ML IJ SOLN: 4.0000 mg | Freq: Four times a day (QID) | INTRAMUSCULAR | Status: DC | PRN

## 2021-11-21 MED ORDER — DIPHENHYDRAMINE HCL 50 MG/ML IJ SOLN: 25.0000 mg | INTRAMUSCULAR | Status: DC | PRN

## 2021-12-06 ENCOUNTER — Ambulatory Visit: Payer: Medicare Other

## 2021-12-09 NOTE — Death Summary Note (Signed)
?DEATH SUMMARY  ? ?Patient Details  ?Name: Bryan Tyler ?MRN: [id number] ?DOB: 19-Jun-1946 ? ?Admission/Discharge Information  ? ?Admit Date:  11-08-2021  ?Date of Death: Date of Death: 11/24/2021  ?Time of Death: Time of Death: 0927  ?Length of Stay: 16  ?Referring Physician: Vivi Barrack, MD  ? ?Reason(s) for Hospitalization  ?IPF exacerbation ? ?Diagnoses  ?Preliminary cause of death:  ?Secondary Diagnoses (including complications and co-morbidities):  ?Principal Problem: ?  IPF (idiopathic pulmonary fibrosis) ([place]) ?Acute respiratory failure with hypoxia ?Acute exacerbation of IPF ?Rhinovirus infection ?Hyponatremia ?Hyperkalemia ?Hyperglycemia due to steroids ?Leukocytosis ?Hypertension ?Hyperlipidemia ?BPH ?History of prostate cancer ?Hypothyroidism ?GERD ? ? ? ?Brief Hospital Course (including significant findings, care, treatment, and services provided and events leading to death)  ?Bryan Tyler is a 76 y.o. year old male with a past medical history significant for idiopathic pulmonary fibrosis who has been followed by my partners in the pulmonary clinic for the same for several years who presents to our facility on transfer from [facility] in [facility] with a diagnosis of acute respiratory failure with hypoxemia due to an IPF exacerbation.  The patient has previously been followed by Dr. Vaughan Browner and has been enrolled in a clinical trial since January of this year entitled:  ?FGCL-3019-095 (FibroGen Study) is a Phase 3, randomized, double-blind, placebo-controlled multicenter international study to evaluate the efficacy and safety of 30 mg/kg IV infusions of pamrevlumab administered every 3 weeks for 52 weeks as compared to placebo in subjects with Idiopathic Pulmonary Fibrosis. Primary end point is: change in FVC from baseline at week 52. ZEPHYRUS II STUDY. ?  ?The patient tells me that in prior years he was intolerant of antifibrotic agents for pulmonary fibrosis.  However, he has  remained active, and says that he was not using oxygen prior to admission.  Approximately 1 week prior to admission he said that after going to the gym he felt "a little different" but did not complain of shortness of breath or cough.  On Friday, March 23 he traveled to [facility] where shortly after arrival he began to become very short of breath with associated headache and dry cough.  He denies fevers chills, rash, nausea, vomiting, chest pain or sinus complaint.  He is unaware of any sick contacts.  After 2 days of dyspnea his son-in-law took him to the emergency department in [place] where he was admitted for severe acute respiratory failure with hypoxemia by the hospitalist service.  Pulmonary was consulted at [facility], specifically Drs. Posey Pronto and [place].  The patient had a work-up in [place] which included a respiratory viral panel which was negative for COVID and flu however an extended panel showed rhinovirus infection.  Lab work showed a BNP which is mildly elevated at 136 troponin was slightly elevated.  He had a CT angiogram of his chest which showed no pulmonary embolism but he did have severe pulmonary fibrosis and bilateral peripheral opacification.  He was treated with IV steroids, azithromycin and cefepime.  An echocardiogram was performed.  On admission his oxygen needs were 6 L of nasal cannula (March 25) but he was moved to the intensive care unit for progressive worsening of oxygenation and required heated high flow.  Request for transfer to [name] was made on March 27 but there were no beds available so he was moved today 2022/11/09.  By the time of being moved he was transferred on Optiflow (heated high flow) at 40 L/min and  60% FiO2. ?  ?Other lab shows a mycoplasma IgM MRSA swab negative basic metabolic panel is normal troponin slightly 77, she is normal with the exception of a slightly elevated bilirubin.  This is 1.2/dL ? ?He was admitted within the [facility] system and  transferred to [facility] for PLEX to participate in an experimental protocol for patients with acute IPF exacerbations. He completed a modified version of this protocol due to limitations of PLEX availability, but after 3 treatments failed to improve. Before he could receive a 4th treatment he significantly deteriorated with severely increased oxygen and respiratory support requirements. He made the decision to pursue comfort-focused care and wanted to discontinue bipap to transition back to [facility]. He passed away with his wife at bedside within minutes of switching off bipap. ? ? ?Pertinent Labs and Studies  ?Significant Diagnostic Studies ?DG Chest 1 View ? ?Result Date: 11/13/2021 ?CLINICAL DATA:  ARDS EXAM: CHEST  1 VIEW COMPARISON:  Chest x-ray 11/09/2021 FINDINGS: The heart is borderline enlarged but stable. Stable tortuosity of the thoracic aorta. Chronic underlying changes of pulmonary fibrosis with suspected superimposed infiltrates or edema. No definite pleural effusions. IMPRESSION: Chronic underlying pulmonary fibrosis with suspected superimposed infiltrates or edema. Electronically Signed   By: Marijo Sanes M.D.   On: 11/13/2021 07:43  ? ?CT CHEST WO CONTRAST ? ?Result Date: 11/13/2021 ?CLINICAL DATA:  Diffuse/interstitial lung disease IPF EXAM: CT CHEST WITHOUT CONTRAST TECHNIQUE: Multidetector CT imaging of the chest was performed following the standard protocol without IV contrast. RADIATION DOSE REDUCTION: This exam was performed according to the departmental dose-optimization program which includes automated exposure control, adjustment of the mA and/or kV according to patient size and/or use of iterative reconstruction technique. COMPARISON:  Chest CT 09/16/2021 FINDINGS: Cardiovascular: Mild cardiomegaly. No pericardial disease. The ascending aorta measures up to 4.4 cm, previously 4.4 cm (series 2, image 72). Aortic arch is mildly tortuous, unchanged. Mediastinum/Nodes: Mildly prominent hilar lymph nodes,  likely reactive. Atrophic thyroid. The esophagus is filled with fluid and gas. There is a small hiatal hernia. Lungs/Pleura: Slight rightward tracheal deviation similar to prior exam. There is basilar predominant fibrosis with traction bronchiectasis and honeycombing. There are significantly increased mixed consolidative ground-glass opacities in the mid to lower lungs with some increased ground-glass in the apices. Azygous fissure noted. No pneumothorax. No pleural effusion. Upper Abdomen: Cholelithiasis. There is a left upper pole exophytic renal cyst which is unchanged from prior. Musculoskeletal: Prior right shoulder arthroplasty. No acute osseous abnormality. No suspicious lytic or blastic lesions. Exaggerated thoracic kyphosis. Multilevel degenerative changes of the spine. IMPRESSION: UIP pattern interstitial lung disease with significantly increased mixed ground-glass and consolidative opacities in the mid to lower lungs with some increased ground-glass opacities in the lung apices. Findings are compatible with acute exacerbation of interstitial lung disease or superimposed infectious/inflammatory process. Prominent hilar lymph nodes which are likely reactive. Stable ascending aortic aneurysm measuring up to 4.4 cm. Recommend annual imaging followup by CTA or MRA. This recommendation follows 2010 ACCF/AHA/AATS/ACR/ASA/SCA/SCAI/SIR/STS/SVM Guidelines for the Diagnosis and Management of Patients with Thoracic Aortic Disease. Circulation. 2010; 121: W119-J478. Aortic aneurysm NOS (ICD10-I71.9) Electronically Signed   By: Maurine Simmering M.D.   On: 11/13/2021 14:10  ? ?IR US Guide Vasc Access Right ? ?Result Date: 11/15/2021 ?INDICATION: 76 year old with pulmonary fibrosis and severe respiratory distress. Request for plasmapheresis catheter. EXAM: ULTRASOUND-GUIDED PLACEMENT OF NON TUNNELED DIALYSIS/PHERESIS CATHETER Physician: Stephan Minister. Anselm Pancoast, MD MEDICATIONS: 1% lidocaine ANESTHESIA/SEDATION: None FLUOROSCOPY TIME:  None  COMPLICATIONS: None immediate. PROCEDURE: The  procedure was explained to the patient. The risks and benefits of the procedure were discussed and the patient's questions were addressed. Informed con

## 2021-12-09 NOTE — Progress Notes (Signed)
? ?NAME:  Bryan Tyler, MRN:  [id number], DOB:  07-15-1946, LOS: 67 ?ADMISSION DATE:  10/26/2021, CONSULTATION DATE:  3/28 ?REFERRING MD:  Lingamgunta, [place], CHIEF COMPLAINT:  Dyspnea  ? ?BRIEF  ?This is a pleasant 76 year old male with a past medical history significant for idiopathic pulmonary fibrosis who has been followed by my partners in the pulmonary clinic for the same for several years who presents to our facility on transfer from [facility] in [facility] with a diagnosis of acute respiratory failure with hypoxemia due to an IPF exacerbation.  The patient has previously been followed by Dr. Vaughan Browner and has been enrolled in a clinical trial since January of this year entitled:  ?[id number] (FibroGen Study) is a Phase 3, randomized, double-blind, placebo-controlled multicenter international study to evaluate the efficacy and safety of 30 mg/kg IV infusions of pamrevlumab administered every 3 weeks for 52 weeks as compared to placebo in subjects with Idiopathic Pulmonary Fibrosis. Primary end point is: change in FVC from baseline at week 52. ZEPHYRUS II STUDY. ? ?The patient tells me that in prior years he was intolerant of antifibrotic agents for pulmonary fibrosis.  However, he has remained active, and says that he was not using oxygen prior to admission.  Approximately 1 week prior to admission he said that after going to the gym he felt "a little different" but did not complain of shortness of breath or cough.  On Friday, March 23 he traveled to [facility] where shortly after arrival he began to become very short of breath with associated headache and dry cough.  He denies fevers chills, rash, nausea, vomiting, chest pain or sinus complaint.  He is unaware of any sick contacts.  After 2 days of dyspnea his son-in-law took him to the emergency department in [place] where he was admitted for severe acute respiratory failure with hypoxemia by the hospitalist service.   Pulmonary was consulted at [facility], specifically Drs. Posey Pronto and [place].  The patient had a work-up in [place] which included a respiratory viral panel which was negative for COVID and flu however an extended panel showed rhinovirus infection.  Lab work showed a BNP which is mildly elevated at 136 troponin was slightly elevated.  He had a CT angiogram of his chest which showed no pulmonary embolism but he did have severe pulmonary fibrosis and bilateral peripheral opacification.  He was treated with IV steroids, azithromycin and cefepime.  An echocardiogram was performed.  On admission his oxygen needs were 6 L of nasal cannula (March 25) but he was moved to the intensive care unit for progressive worsening of oxygenation and required heated high flow.  Request for transfer to [name] was made on March 27 but there were no beds available so he was moved today March 28.  By the time of being moved he was transferred on Optiflow (heated high flow) at 40 L/min and 60% FiO2. ? ?Other lab shows a mycoplasma IgM MRSA swab negative basic metabolic panel is normal troponin slightly 77, she is normal with the exception of a slightly elevated [name].  This is 1.2/dL ? ?Pertinent  Medical History  ?Idiopathic pulmonary fibrosis ?Adenocarcinoma of the prostate ?Thoracic aortic aneurysm ?Anxiety ?Depression ?Diverticulosis ?GERD ?Hyperlipidemia ?Hypertension ?Hypothyroidism ?Obstructive sleep apnea, not on CPAP ?Osteoporosis ? ?Significant Hospital Events: ?Including procedures, antibiotic start and stop dates in addition to other pertinent events   ?March 25, admitted to [facility] in [place], pulmonary consulted, treated with steroids, ceftriaxone, azithromycin for an  IPF flare, noted to be rhinovirus positive.  COVID and flu testing negative ?March 26 moved to the intensive care unit for heated high flow ?March 28 transferred to [facility] long hospital ?3/31 On 20 L / 60% ?4/2 HFNC 30L/ 50%, continues to  desaturate w/minimal exertion ?4/3 and 4/4 no changes, remains on 50-60%, 30L and NRB w/exertion.  Sed rate wnl, PCT neg ?4/7 transferred to [facility] for PLEX protocol.  ?4/8 PLEX #1 ?4/10 Plex #2  ?4/11 PLEX #3 ?4/12 started on bipap overnight for refractory hypoxia ? ?Interim History / Subjective:  ?Feeling very SOB and anxious today. ? ?Objective   ?Blood pressure 133/88, pulse 80, temperature 97.6 ?F (36.4 ?C), temperature source Axillary, resp. rate (!) 28, height '5\' 9"'$  (1.753 m), weight 78.1 kg, SpO2 (!) 84 %. ?   ?FiO2 (%):  [80 %-100 %] 100 %  ? ?Intake/Output Summary (Last 24 hours) at Nov 27, 2021 0719 ?Last data filed at 11-27-2021 0300 ?Gross per 24 hour  ?Intake 480 ml  ?Output 500 ml  ?Net -20 ml  ? ? ?Filed Weights  ? 11/19/21 0600 11/20/21 0600 11-27-21 0500  ?Weight: 77.8 kg 78.1 kg 78.1 kg  ? ? ?Examination:  ?General Appearance: ill appearing man in respiratory distress on bipap ?Head: [place]/AT, eyes anicteric ?Neck: RIJ HD catheter ?Lungs: very tachypneic, using accessory muscles, rhales bilaterally ?Heart: S1S2, tachycardic ?Abdomen: soft, NT ?Extremities: no edema, digital cyanosis ?Skin: warm, dry, no rashes ?Neurologic: awake, alert, moving all extremities ? ?WBC 33.7 ?H/H 13.0/41.1 ?Na+ 134 ?K+ 5.6 ?BUN 39 ?Cr 0.65 ? ?Resolved problems list:   ? ? ?Assessment & Plan:  ? ?Acute hypoxic respiratory failure requiring heated high flow oxygen due to rhinovirus- induced IPF flare. Not on home O2 PTA. ?-Con't supplemental O2-- switching to focus on comfort today. Transitioned BiPAP back to [facility] ?--Con't steroids ?--I let him know that we are at a point that it is clear he is not responding to treatment and will not survive this IPF flare. He understands. When his wife arrived at bedside I discussed what he and I had discussed yesterday about how it was unlikely that we would see benefit from the protocol, and the decompensation overnight and today confirms that. They agreed with transitioning to focusing on  his comfort. ?-no further PLEX  ? ?Hyperkalemia ?-lokelma if he can come off bipap ?-recheck BMP ? ?Anxiety ?Depression  ?-Continue gabapentin, hydroxyzine, Effexor, Abilify ?-increased clonazapem to BID already ?-ativan PRN increased ? ?Hypothyroidism ?-con't synthroid ? ?Steroid- induced hyperglycemia ?-SSI PRN ? ?BPH ?Hx prostate cancer  ?-con't tamsulosin ? ?GERD ?-PPI ? ?HTN ?-amlodipine ? ?HLD ?-atorvastatin ? ?Wife updated at bedside, anticipate he will pass today.  ? ? ?Best Practice (right click and "Reselect all SmartList Selections" daily)  ? ?Diet/type: Full diet ?DVT prophylaxis: LMWH ?GI prophylaxis: PPI ?Lines: Dialysis Catheter ?Foley:  N/A ?Code Status:  DNR , comfort care ? ? ? ?This patient is critically ill with multiple organ system failure which requires frequent high complexity decision making, assessment, support, evaluation, and titration of therapies. This was completed through the application of advanced monitoring technologies and extensive interpretation of multiple databases. During this encounter critical care time was devoted to patient care services described in this note for 40 minutes. ? ? ?Julian Hy, DO 11-27-2021 10:01 AM ?[facility] ? ?

## 2021-12-09 NOTE — Plan of Care (Signed)
?  Interdisciplinary Goals of Care Family Meeting ? ? ?Date carried out:: 11/22/21 ? ?Location of the meeting: Bedside ? ?Member's involved: Physician, Bedside Registered Nurse, and Family Member or next of kin ? ?Durable Power of Tour manager: wife   ? ?Discussion: We discussed goals of care for [employer] .  He is clearly worse than yesterday and is not likely to survive this illness. He would like to be made comfortable at this point. We will transition from bipap back to [facility] and be more aggressive with ativan and morphine for air hunger. Liberalized visitation.  ? ?Code status: Full DNR ? ?Disposition: In-patient comfort care ? ? ?Time spent for the meeting: 10 min.  ? ?Julian Hy ?11-22-21, 9:14 AM ? ?

## 2021-12-09 NOTE — Progress Notes (Signed)
Patients BiPAP settings adjusted for patients WOB. Sats low 80's, however, FiO2 is at 100%. EPAP increased to 10 and IPAP decreased to 11. Patient MVe now in the 20's instead of the mid 30's and patient seems more comfortable at this time. Will continue to monitor.  ?

## 2021-12-09 NOTE — Progress Notes (Signed)
Bipap removed and patient placed on HHFNC and NRB per PCCM. Pt asked if NRB mask could be removed due to being loud. NRB removed and within a minute pt started having agonal respirations, RN made aware to get wife from waiting room. Pt passed away peacefully with RRT, RN and wife at bedside. MD made aware. ?

## 2021-12-09 NOTE — Progress Notes (Signed)
Brief nephrology note ? ?Patient had worsening hypoxia overnight and was placed on BiPAP.  Persistent difficulty maintaining saturations.  After discussion with critical care team we agree that further plasmapheresis would likely be futile. ? ?Our team will take a step back and sign off of the patient.  If our help is needed in the future please do not hesitate to contact us. ?

## 2021-12-09 NOTE — Progress Notes (Signed)
eLink Physician-Brief Progress Note ?Patient Name: Bryan Tyler ?DOB: 09-Oct-1945 ?MRN: [id number] ? ? ?Date of Service ? Nov 30, 2021  ?HPI/Events of Note ? Hypoxia - O2 sat remains in the 70's in spite of BiPAP. Patient is a DNR/DNI. CXR yesterday at 11:16 AM revealed an unchanged AP portable chest radiograph with cardiomegaly and diffuse heterogeneous, and coarse fibrotic interstitial opacity throughout the lungs. No new airspace opacity.  ?eICU Interventions ? Plan: ?Will request that PCCM ground team evaluate the patient at bedside.   ? ? ? ?Intervention Category ?Major Interventions: Hypoxemia - evaluation and management ? ?Trease Bremner Cornelia Copa ?2021/11/30, 1:10 AM ?

## 2021-12-09 DEATH — deceased

## 2021-12-14 IMAGING — CT CT HEART MORP W/ CTA COR W/ SCORE W/ CA W/CM &/OR W/O CM
4 of 7 series · 8 of 20 positions shown, 9 images · non-contrast
Comparison: None.
COMPARISON: None.

Addendum:
EXAM:
OVER-READ INTERPRETATION  CT CHEST

The following report is an over-read performed by radiologist Dr.
Sauban Fajana [REDACTED] on 11/28/2019. This
over-read does not include interpretation of cardiac or coronary
anatomy or pathology. The coronary calcium score/coronary CTA
interpretation by the cardiologist is attached.
CLINICAL DATA: Chest pain
Cardiac CTA
MEDICATIONS:
Sub lingual nitro. 4 mg
TECHNIQUE: The patient was scanned on a Siemens Force 192 scanner. Gantry
rotation speed was 250 msecs. Collimation was. 6 mm . A 120 kV
prospective scan was triggered in the ascending thoracic aorta at
140 HU's with full mA between 30-70% of the R-R interval . Average
HR during the scan was 67 bpm. The 3D data set was interpreted on a
dedicated work station using MPR, MIP and VRT modes. A total of 80
cc of contrast was used.

[Series 6: best diast 75 % · axial · 0.39mm/px · z∈[-178,-134]mm · 2 of 331 slices shown, 3 images]
[im 111/331  vessel]
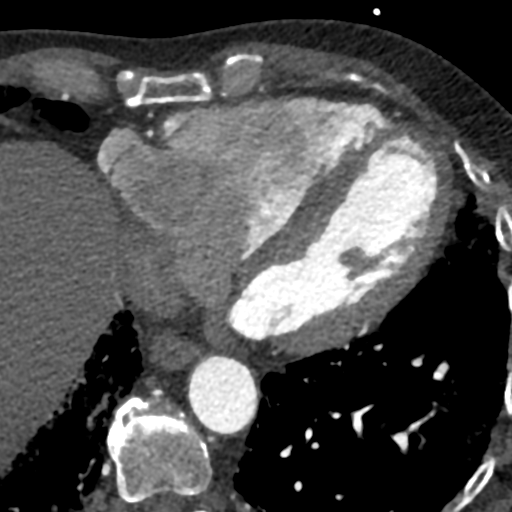
[im 111/331  lung]
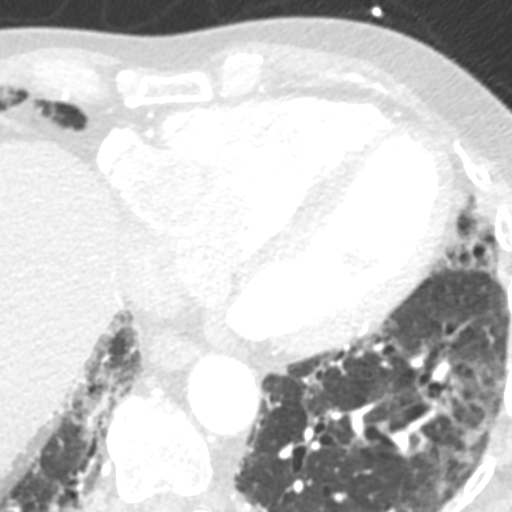
[im 221/331  vessel]
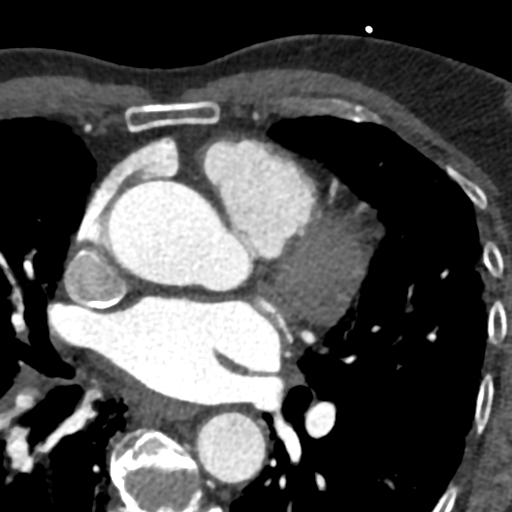

[Series 7: best syst 34 % · axial · 0.39mm/px · z∈[-178,-134]mm · 2 of 331 slices shown]
[im 111/331  vessel]
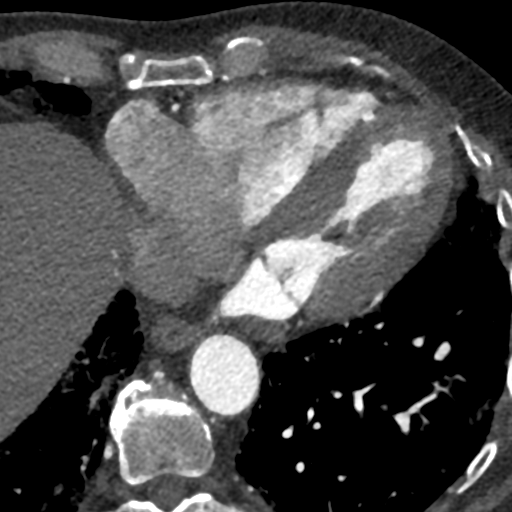
[im 221/331  vessel]
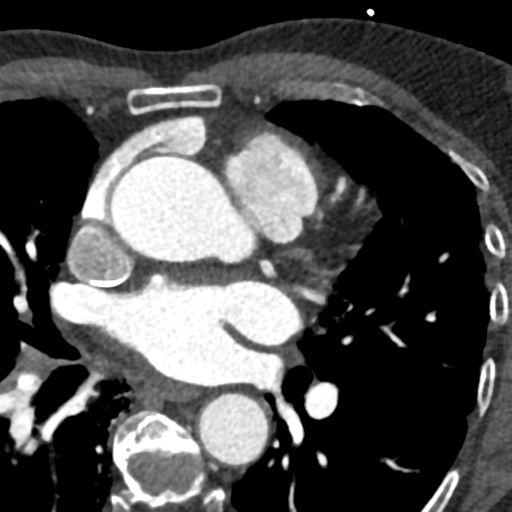

[Series 8: ts diast sharp 34 % · axial · 0.39mm/px · z∈[-178,-134]mm · 2 of 331 slices shown]
[im 111/331  lung]
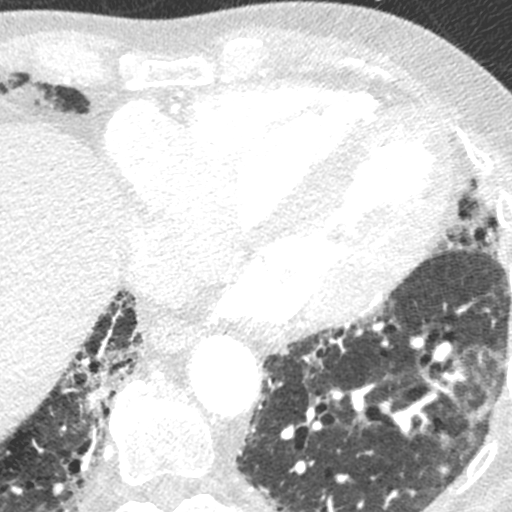
[im 221/331  lung]
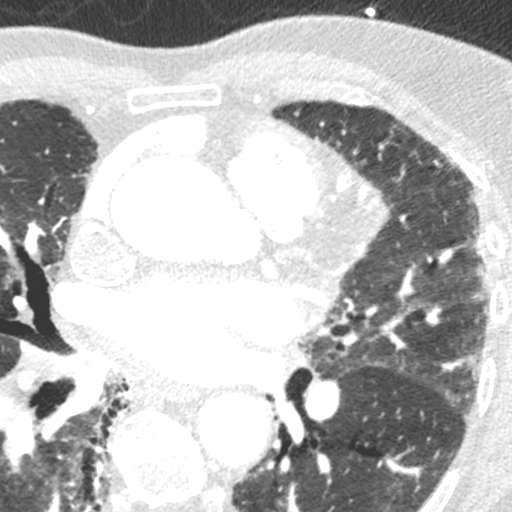

[Series 9: ts syst sharp 75 % · axial · 0.39mm/px · z∈[-178,-134]mm · 2 of 331 slices shown]
[im 111/331  lung]
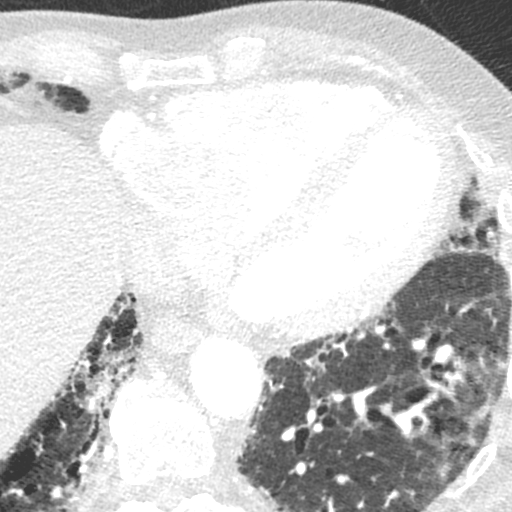
[im 221/331  lung]
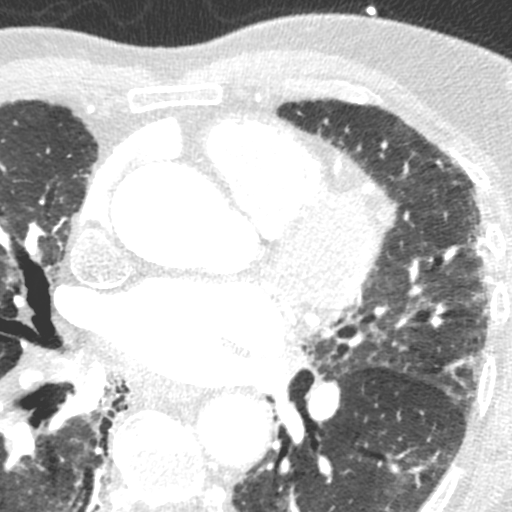

[8 of 20 positions shown; findings below may reference images not displayed]

FINDINGS: Aortic atherosclerosis. Widespread areas of ground-glass
attenuation, septal thickening, thickening of the
peribronchovascular interstitium, cylindrical bronchiectasis,
regional architectural distortion and early honeycombing noted in
the visualized lungs, most evident in the lung bases. Within the
visualized portions of the thorax there are no suspicious appearing
pulmonary nodules or masses, there is no acute consolidative
airspace disease, no pleural effusions, no pneumothorax and no
lymphadenopathy. Visualized portions of the upper abdomen are
unremarkable. There are no aggressive appearing lytic or blastic
lesions noted in the visualized portions of the skeleton.
IMPRESSION: 1. The appearance of the lungs is compatible with interstitial lung
disease, with a spectrum of findings categorized as usual
interstitial pneumonia (UIP) per current ATS guidelines.
2. Aortic atherosclerosis.
FINDINGS: Non-cardiac: See separate report from [REDACTED]. No
significant findings on limited lung and soft tissue windows.

Calcium score: Calcium noted in all 3 major epicardial vessels and
D3

Coronary Arteries: Right dominant with no anomalies

LM: Normal

LAD: 25-49% calcified plaque in mid vessel 1-24% calcified plaque in
distal vessel

D1: Normal

D2: Normal

D3: 1-24% calcified plaque mid vessel

Circumflex: 1-24% calcified plaque proximally

OM1: 1-24% calcified plaque in mid vessel

OM2: Normal

RCA: 1-24% calcified plaque in proximal vessel

PDA: Normal

PLA: Normal
IMPRESSION: 1.  Calcium score 241 which is 49 th percentile for age and sex

2.   CAD RADS 2 non obstructive CAD see description above

3.   Dilated aortic root 4.1 cm

4.  See radiology description regarding MUSHARIFA

Musebiye Tarakchiev

*** End of Addendum ***
EXAM:
OVER-READ INTERPRETATION  CT CHEST

The following report is an over-read performed by radiologist Dr.
Sauban Fajana [REDACTED] on 11/28/2019. This
over-read does not include interpretation of cardiac or coronary
anatomy or pathology. The coronary calcium score/coronary CTA
interpretation by the cardiologist is attached.
FINDINGS: Aortic atherosclerosis. Widespread areas of ground-glass
attenuation, septal thickening, thickening of the
peribronchovascular interstitium, cylindrical bronchiectasis,
regional architectural distortion and early honeycombing noted in
the visualized lungs, most evident in the lung bases. Within the
visualized portions of the thorax there are no suspicious appearing
pulmonary nodules or masses, there is no acute consolidative
airspace disease, no pleural effusions, no pneumothorax and no
lymphadenopathy. Visualized portions of the upper abdomen are
unremarkable. There are no aggressive appearing lytic or blastic
lesions noted in the visualized portions of the skeleton.
IMPRESSION: 1. The appearance of the lungs is compatible with interstitial lung
disease, with a spectrum of findings categorized as usual
interstitial pneumonia (UIP) per current ATS guidelines.
2. Aortic atherosclerosis.

## 2021-12-27 ENCOUNTER — Encounter: Payer: Medicare Other | Admitting: Adult Health

## 2021-12-27 ENCOUNTER — Ambulatory Visit: Payer: Medicare Other

## 2022-01-01 ENCOUNTER — Ambulatory Visit: Payer: Medicare Other | Admitting: Psychiatry

## 2022-01-17 ENCOUNTER — Ambulatory Visit: Payer: Medicare Other

## 2022-02-07 ENCOUNTER — Ambulatory Visit: Payer: Medicare Other
